# Patient Record
Sex: Female | Born: 1950
Health system: Southern US, Community
[De-identification: ages and names within clinical notes are randomized; demographics above are authoritative.]

## PROBLEM LIST (undated history)

## (undated) DIAGNOSIS — K219 Gastro-esophageal reflux disease without esophagitis: Secondary | ICD-10-CM

## (undated) DIAGNOSIS — E669 Obesity, unspecified: Secondary | ICD-10-CM

## (undated) DIAGNOSIS — M1990 Unspecified osteoarthritis, unspecified site: Secondary | ICD-10-CM

## (undated) DIAGNOSIS — J45909 Unspecified asthma, uncomplicated: Secondary | ICD-10-CM

## (undated) DIAGNOSIS — G43909 Migraine, unspecified, not intractable, without status migrainosus: Secondary | ICD-10-CM

## (undated) DIAGNOSIS — K589 Irritable bowel syndrome without diarrhea: Secondary | ICD-10-CM

## (undated) DIAGNOSIS — H269 Unspecified cataract: Secondary | ICD-10-CM

## (undated) DIAGNOSIS — J309 Allergic rhinitis, unspecified: Secondary | ICD-10-CM

## (undated) DIAGNOSIS — I10 Essential (primary) hypertension: Secondary | ICD-10-CM

## (undated) DIAGNOSIS — G609 Hereditary and idiopathic neuropathy, unspecified: Secondary | ICD-10-CM

## (undated) DIAGNOSIS — F411 Generalized anxiety disorder: Secondary | ICD-10-CM

## (undated) DIAGNOSIS — N189 Chronic kidney disease, unspecified: Secondary | ICD-10-CM

## (undated) DIAGNOSIS — L4050 Arthropathic psoriasis, unspecified: Secondary | ICD-10-CM

## (undated) DIAGNOSIS — J42 Unspecified chronic bronchitis: Secondary | ICD-10-CM

## (undated) DIAGNOSIS — J449 Chronic obstructive pulmonary disease, unspecified: Secondary | ICD-10-CM

## (undated) DIAGNOSIS — R011 Cardiac murmur, unspecified: Secondary | ICD-10-CM

## (undated) DIAGNOSIS — IMO0002 Reserved for concepts with insufficient information to code with codable children: Secondary | ICD-10-CM

## (undated) DIAGNOSIS — I872 Venous insufficiency (chronic) (peripheral): Secondary | ICD-10-CM

## (undated) DIAGNOSIS — D649 Anemia, unspecified: Secondary | ICD-10-CM

## (undated) DIAGNOSIS — E039 Hypothyroidism, unspecified: Secondary | ICD-10-CM

## (undated) DIAGNOSIS — T7840XA Allergy, unspecified, initial encounter: Secondary | ICD-10-CM

## (undated) HISTORY — DX: Cardiac murmur, unspecified: R01.1

## (undated) HISTORY — DX: Irritable bowel syndrome, unspecified: K58.9

## (undated) HISTORY — PX: TOTAL SHOULDER ARTHROPLASTY: SHX126

## (undated) HISTORY — PX: ABLATION ON ENDOMETRIOSIS: SHX5787

## (undated) HISTORY — DX: Unspecified cataract: H26.9

## (undated) HISTORY — DX: Migraine, unspecified, not intractable, without status migrainosus: G43.909

## (undated) HISTORY — DX: Venous insufficiency (chronic) (peripheral): I87.2

## (undated) HISTORY — PX: EYE SURGERY: SHX253

## (undated) HISTORY — DX: Allergy, unspecified, initial encounter: T78.40XA

## (undated) HISTORY — DX: Hypothyroidism, unspecified: E03.9

## (undated) HISTORY — PX: ABDOMINAL HYSTERECTOMY: SUR658

## (undated) HISTORY — PX: CATARACT EXTRACTION, BILATERAL: SHX1313

## (undated) HISTORY — PX: NASAL SINUS SURGERY: SHX719

## (undated) HISTORY — PX: FRACTURE SURGERY: SHX138

## (undated) HISTORY — DX: Essential (primary) hypertension: I10

## (undated) HISTORY — DX: Unspecified osteoarthritis, unspecified site: M19.90

## (undated) HISTORY — DX: Gastro-esophageal reflux disease without esophagitis: K21.9

## (undated) HISTORY — DX: Allergic rhinitis, unspecified: J30.9

## (undated) HISTORY — DX: Arthropathic psoriasis, unspecified: L40.50

## (undated) HISTORY — PX: JOINT REPLACEMENT: SHX530

## (undated) HISTORY — DX: Generalized anxiety disorder: F41.1

## (undated) HISTORY — DX: Unspecified asthma, uncomplicated: J45.909

## (undated) HISTORY — PX: TUBAL LIGATION: SHX77

## (undated) HISTORY — DX: Unspecified chronic bronchitis: J42

## (undated) HISTORY — DX: Chronic kidney disease, unspecified: N18.9

## (undated) HISTORY — DX: Obesity, unspecified: E66.9

## (undated) HISTORY — DX: Hereditary and idiopathic neuropathy, unspecified: G60.9

## (undated) HISTORY — PX: ABDOMINAL HYSTERECTOMY: SHX81

## (undated) HISTORY — DX: Anemia, unspecified: D64.9

## (undated) HISTORY — DX: Chronic obstructive pulmonary disease, unspecified: J44.9

## (undated) HISTORY — DX: Reserved for concepts with insufficient information to code with codable children: IMO0002

---

## 1998-08-21 ENCOUNTER — Other Ambulatory Visit: Admission: RE | Admit: 1998-08-21 | Discharge: 1998-08-21 | Payer: Self-pay | Admitting: *Deleted

## 1998-09-03 ENCOUNTER — Other Ambulatory Visit: Admission: RE | Admit: 1998-09-03 | Discharge: 1998-09-03 | Payer: Self-pay | Admitting: Obstetrics and Gynecology

## 1999-10-08 ENCOUNTER — Other Ambulatory Visit: Admission: RE | Admit: 1999-10-08 | Discharge: 1999-10-08 | Payer: Self-pay | Admitting: *Deleted

## 2000-03-30 ENCOUNTER — Other Ambulatory Visit: Admission: RE | Admit: 2000-03-30 | Discharge: 2000-03-30 | Payer: Self-pay | Admitting: Podiatry

## 2002-08-24 ENCOUNTER — Encounter: Payer: Self-pay | Admitting: Rheumatology

## 2002-08-24 ENCOUNTER — Encounter: Admission: RE | Admit: 2002-08-24 | Discharge: 2002-08-24 | Payer: Self-pay | Admitting: Rheumatology

## 2003-06-09 HISTORY — PX: SHOULDER SURGERY: SHX246

## 2003-08-27 ENCOUNTER — Other Ambulatory Visit: Admission: RE | Admit: 2003-08-27 | Discharge: 2003-08-27 | Payer: Self-pay | Admitting: Obstetrics & Gynecology

## 2003-08-31 ENCOUNTER — Emergency Department (HOSPITAL_COMMUNITY): Admission: AD | Admit: 2003-08-31 | Discharge: 2003-08-31 | Payer: Self-pay | Admitting: Family Medicine

## 2003-12-03 ENCOUNTER — Emergency Department (HOSPITAL_COMMUNITY): Admission: EM | Admit: 2003-12-03 | Discharge: 2003-12-03 | Payer: Self-pay | Admitting: Emergency Medicine

## 2004-04-23 ENCOUNTER — Ambulatory Visit: Payer: Self-pay | Admitting: Pulmonary Disease

## 2004-08-05 ENCOUNTER — Ambulatory Visit: Payer: Self-pay | Admitting: Internal Medicine

## 2004-08-07 ENCOUNTER — Ambulatory Visit: Payer: Self-pay | Admitting: Pulmonary Disease

## 2004-08-16 ENCOUNTER — Emergency Department (HOSPITAL_COMMUNITY): Admission: EM | Admit: 2004-08-16 | Discharge: 2004-08-16 | Payer: Self-pay | Admitting: Family Medicine

## 2004-08-20 ENCOUNTER — Ambulatory Visit (HOSPITAL_COMMUNITY): Admission: RE | Admit: 2004-08-20 | Discharge: 2004-08-20 | Payer: Self-pay | Admitting: Emergency Medicine

## 2004-08-26 ENCOUNTER — Ambulatory Visit: Payer: Self-pay | Admitting: Internal Medicine

## 2004-08-26 HISTORY — PX: COLONOSCOPY: SHX174

## 2005-04-29 ENCOUNTER — Ambulatory Visit: Payer: Self-pay | Admitting: Pulmonary Disease

## 2005-09-02 ENCOUNTER — Ambulatory Visit: Payer: Self-pay | Admitting: Pulmonary Disease

## 2006-02-15 ENCOUNTER — Encounter: Admission: RE | Admit: 2006-02-15 | Discharge: 2006-02-15 | Payer: Self-pay | Admitting: Orthopedic Surgery

## 2006-03-29 ENCOUNTER — Ambulatory Visit: Payer: Self-pay | Admitting: Pulmonary Disease

## 2006-04-16 ENCOUNTER — Ambulatory Visit: Payer: Self-pay | Admitting: Pulmonary Disease

## 2006-04-16 LAB — CONVERTED CEMR LAB
ALT: 25 units/L (ref 0–40)
Alkaline Phosphatase: 80 units/L (ref 39–117)
BUN: 16 mg/dL (ref 6–23)
Basophils Relative: 1.1 % — ABNORMAL HIGH (ref 0.0–1.0)
Calcium: 9.3 mg/dL (ref 8.4–10.5)
Chol/HDL Ratio, serum: 3.4
Cholesterol: 189 mg/dL (ref 0–200)
Glomerular Filtration Rate, Af Am: 67 mL/min/{1.73_m2}
HCT: 39.2 % (ref 36.0–46.0)
Hemoglobin: 13.3 g/dL (ref 12.0–15.0)
Lymphocytes Relative: 32.5 % (ref 12.0–46.0)
Neutrophils Relative %: 56 % (ref 43.0–77.0)
Potassium: 4.6 meq/L (ref 3.5–5.1)
TSH: 2.84 microintl units/mL (ref 0.35–5.50)
Total Protein: 6.9 g/dL (ref 6.0–8.3)
WBC: 7.1 10*3/uL (ref 4.5–10.5)

## 2006-09-29 ENCOUNTER — Ambulatory Visit: Payer: Self-pay | Admitting: Pulmonary Disease

## 2007-04-08 ENCOUNTER — Ambulatory Visit: Payer: Self-pay | Admitting: Pulmonary Disease

## 2007-06-09 HISTORY — PX: BUNIONECTOMY: SHX129

## 2007-11-24 DIAGNOSIS — G609 Hereditary and idiopathic neuropathy, unspecified: Secondary | ICD-10-CM | POA: Insufficient documentation

## 2007-11-24 DIAGNOSIS — K589 Irritable bowel syndrome without diarrhea: Secondary | ICD-10-CM | POA: Insufficient documentation

## 2007-11-24 DIAGNOSIS — K219 Gastro-esophageal reflux disease without esophagitis: Secondary | ICD-10-CM | POA: Insufficient documentation

## 2007-11-24 DIAGNOSIS — F411 Generalized anxiety disorder: Secondary | ICD-10-CM | POA: Insufficient documentation

## 2007-11-24 DIAGNOSIS — L4050 Arthropathic psoriasis, unspecified: Secondary | ICD-10-CM | POA: Insufficient documentation

## 2007-12-28 ENCOUNTER — Telehealth: Payer: Self-pay | Admitting: Pulmonary Disease

## 2008-01-26 ENCOUNTER — Ambulatory Visit: Payer: Self-pay | Admitting: Pulmonary Disease

## 2008-02-08 ENCOUNTER — Encounter: Payer: Self-pay | Admitting: Pulmonary Disease

## 2008-03-08 ENCOUNTER — Ambulatory Visit: Payer: Self-pay | Admitting: Pulmonary Disease

## 2008-03-11 DIAGNOSIS — I872 Venous insufficiency (chronic) (peripheral): Secondary | ICD-10-CM | POA: Insufficient documentation

## 2008-03-11 DIAGNOSIS — Z8669 Personal history of other diseases of the nervous system and sense organs: Secondary | ICD-10-CM | POA: Insufficient documentation

## 2008-03-11 DIAGNOSIS — J42 Unspecified chronic bronchitis: Secondary | ICD-10-CM | POA: Insufficient documentation

## 2008-03-11 DIAGNOSIS — J309 Allergic rhinitis, unspecified: Secondary | ICD-10-CM | POA: Insufficient documentation

## 2008-03-11 LAB — CONVERTED CEMR LAB
Alkaline Phosphatase: 69 units/L (ref 39–117)
Basophils Absolute: 0 10*3/uL (ref 0.0–0.1)
Bilirubin, Direct: 0.1 mg/dL (ref 0.0–0.3)
GFR calc Af Amer: 74 mL/min
GFR calc non Af Amer: 61 mL/min
Glucose, Bld: 98 mg/dL (ref 70–99)
HCT: 36.4 % (ref 36.0–46.0)
HDL: 64.7 mg/dL (ref >39.0)
LDL Cholesterol: 86 mg/dL (ref 0–99)
Monocytes Absolute: 0.5 10*3/uL (ref 0.1–1.0)
Monocytes Relative: 5.5 % (ref 3.0–12.0)
Platelets: 258 10*3/uL (ref 150–400)
Potassium: 4.3 meq/L (ref 3.5–5.1)
RDW: 14.8 % — ABNORMAL HIGH (ref 11.5–14.6)
Sodium: 143 meq/L (ref 135–145)
Total Bilirubin: 0.7 mg/dL (ref 0.3–1.2)
Total CHOL/HDL Ratio: 2.7
Total Protein: 6.7 g/dL (ref 6.0–8.3)
Triglycerides: 114 mg/dL (ref 0–149)

## 2008-03-30 ENCOUNTER — Encounter: Payer: Self-pay | Admitting: Pulmonary Disease

## 2008-05-18 ENCOUNTER — Ambulatory Visit: Payer: Self-pay | Admitting: Pulmonary Disease

## 2008-05-25 ENCOUNTER — Encounter: Payer: Self-pay | Admitting: Pulmonary Disease

## 2008-07-18 ENCOUNTER — Encounter: Payer: Self-pay | Admitting: Pulmonary Disease

## 2008-11-22 ENCOUNTER — Encounter: Payer: Self-pay | Admitting: Pulmonary Disease

## 2009-03-08 ENCOUNTER — Ambulatory Visit: Payer: Self-pay | Admitting: Pulmonary Disease

## 2009-04-11 ENCOUNTER — Encounter: Payer: Self-pay | Admitting: Pulmonary Disease

## 2009-07-23 ENCOUNTER — Encounter: Payer: Self-pay | Admitting: Pulmonary Disease

## 2009-09-20 ENCOUNTER — Encounter: Payer: Self-pay | Admitting: Adult Health

## 2009-10-01 IMAGING — CR DG CHEST 2V
1 series · 1 of 1 positions shown · non-contrast
Comparison: Chest x-ray of 12/03/2003 and 04/23/2004

CLINICAL DATA: Acute sinusitis, allergic rhinitis, symptoms of
reflux, former smoker

CHEST - 2 VIEW

[view not recorded]
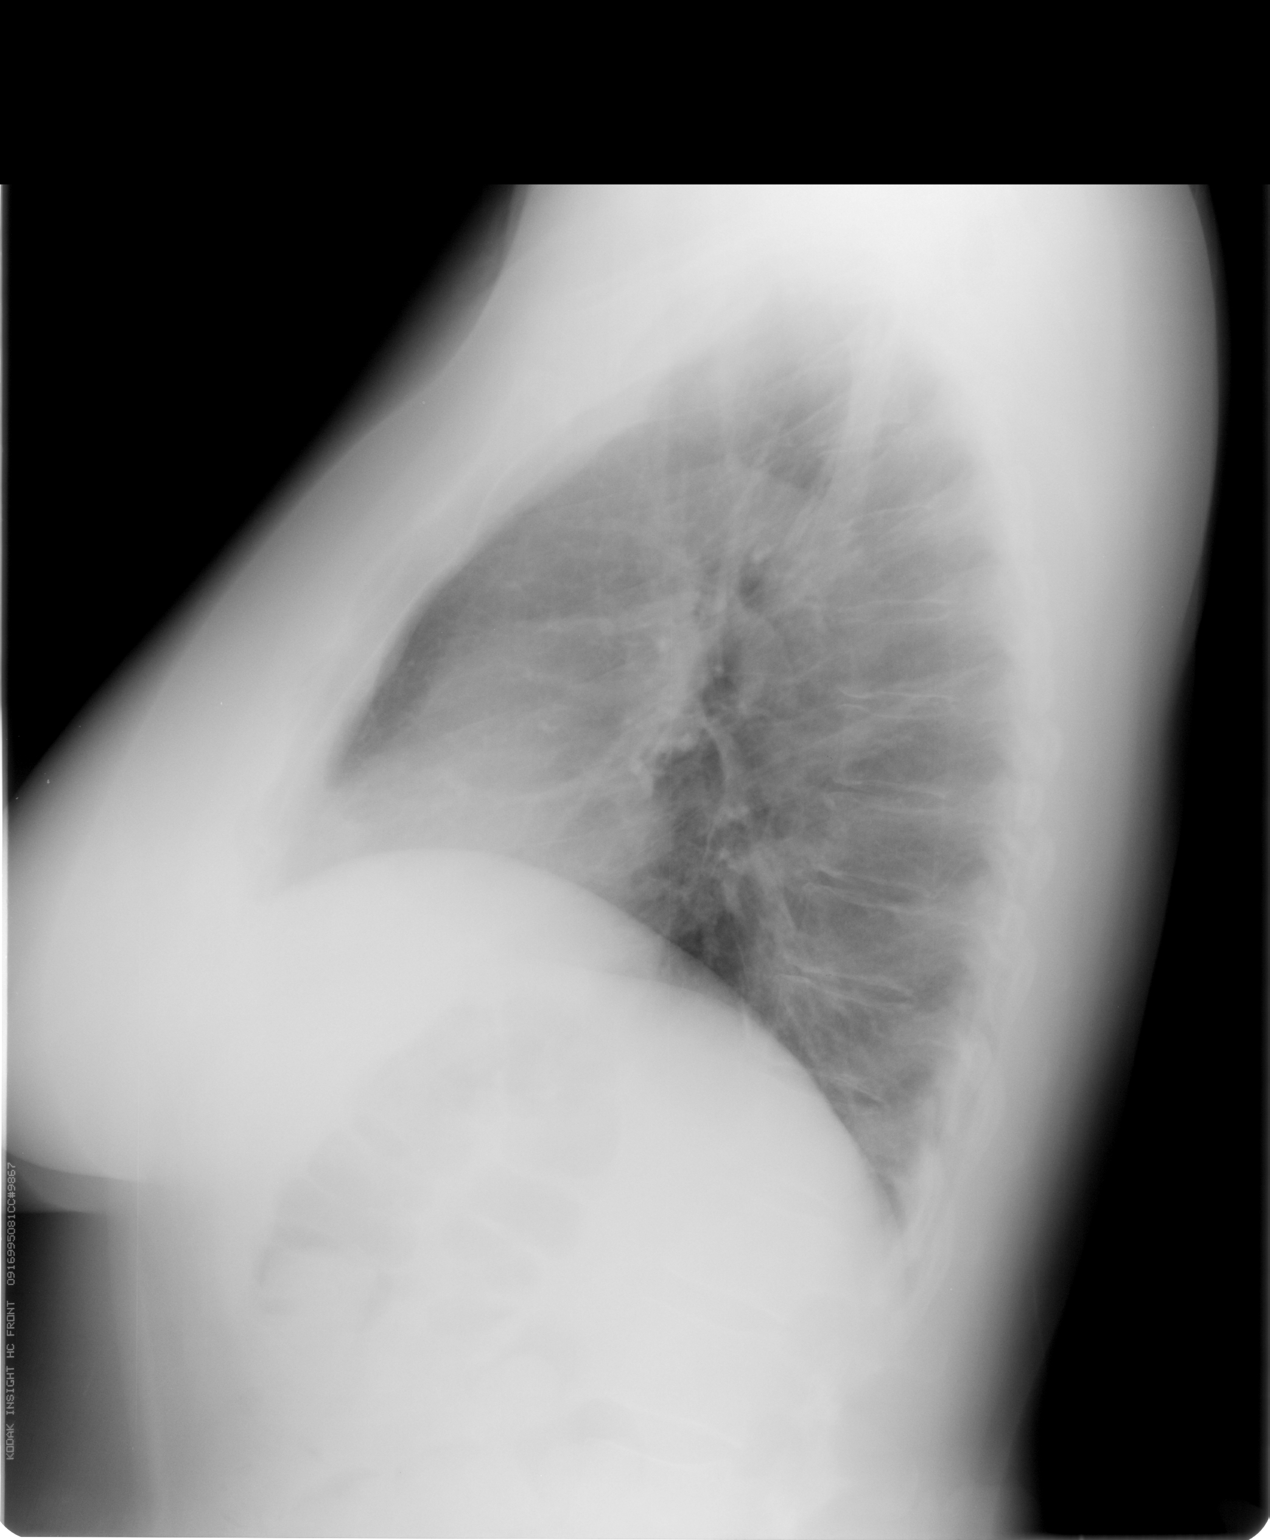

[1 of 1 positions shown; findings below may reference images not displayed]

FINDINGS: The lungs are clear.  Probable granuloma anteriorly on
the lateral view is stable.  The heart is within normal limits in
size.  No bony abnormality is seen.
IMPRESSION: Stable chest x-ray.  No active lung disease.

## 2009-10-02 ENCOUNTER — Ambulatory Visit: Payer: Self-pay | Admitting: Pulmonary Disease

## 2009-10-02 DIAGNOSIS — J45909 Unspecified asthma, uncomplicated: Secondary | ICD-10-CM | POA: Insufficient documentation

## 2009-10-02 DIAGNOSIS — J019 Acute sinusitis, unspecified: Secondary | ICD-10-CM | POA: Insufficient documentation

## 2009-10-02 DIAGNOSIS — D649 Anemia, unspecified: Secondary | ICD-10-CM | POA: Insufficient documentation

## 2009-10-23 LAB — CONVERTED CEMR LAB
Folate: 6.5 ng/mL
Saturation Ratios: 12.6 % — ABNORMAL LOW (ref 20.0–50.0)
Transferrin: 265.8 mg/dL (ref 212.0–360.0)
Vitamin B-12: 840 pg/mL (ref 211–911)

## 2009-11-21 ENCOUNTER — Ambulatory Visit: Payer: Self-pay | Admitting: Pulmonary Disease

## 2009-11-26 LAB — CONVERTED CEMR LAB: OCCULT 1: NEGATIVE

## 2010-01-16 ENCOUNTER — Encounter: Payer: Self-pay | Admitting: Pulmonary Disease

## 2010-01-17 ENCOUNTER — Ambulatory Visit: Payer: Self-pay | Admitting: Pulmonary Disease

## 2010-01-17 DIAGNOSIS — E669 Obesity, unspecified: Secondary | ICD-10-CM | POA: Insufficient documentation

## 2010-01-17 DIAGNOSIS — M1990 Unspecified osteoarthritis, unspecified site: Secondary | ICD-10-CM | POA: Insufficient documentation

## 2010-01-17 DIAGNOSIS — E039 Hypothyroidism, unspecified: Secondary | ICD-10-CM | POA: Insufficient documentation

## 2010-03-04 ENCOUNTER — Encounter: Payer: Self-pay | Admitting: Pulmonary Disease

## 2010-03-06 ENCOUNTER — Telehealth (INDEPENDENT_AMBULATORY_CARE_PROVIDER_SITE_OTHER): Payer: Self-pay | Admitting: *Deleted

## 2010-03-07 ENCOUNTER — Ambulatory Visit: Payer: Self-pay | Admitting: Pulmonary Disease

## 2010-03-11 ENCOUNTER — Telehealth: Payer: Self-pay | Admitting: Pulmonary Disease

## 2010-04-08 HISTORY — PX: OTHER SURGICAL HISTORY: SHX169

## 2010-04-30 ENCOUNTER — Inpatient Hospital Stay (HOSPITAL_COMMUNITY): Admission: RE | Admit: 2010-04-30 | Discharge: 2010-05-05 | Payer: Self-pay | Admitting: Orthopedic Surgery

## 2010-04-30 ENCOUNTER — Inpatient Hospital Stay: Admission: RE | Admit: 2010-04-30 | Payer: Self-pay | Admitting: Orthopedic Surgery

## 2010-06-17 ENCOUNTER — Telehealth (INDEPENDENT_AMBULATORY_CARE_PROVIDER_SITE_OTHER): Payer: Self-pay | Admitting: *Deleted

## 2010-06-19 ENCOUNTER — Other Ambulatory Visit: Payer: Self-pay | Admitting: Adult Health

## 2010-06-19 ENCOUNTER — Ambulatory Visit
Admission: RE | Admit: 2010-06-19 | Discharge: 2010-06-19 | Payer: Self-pay | Source: Home / Self Care | Attending: Adult Health | Admitting: Adult Health

## 2010-06-19 LAB — CBC WITH DIFFERENTIAL/PLATELET
Basophils Absolute: 0 10*3/uL (ref 0.0–0.1)
Basophils Relative: 0.5 % (ref 0.0–3.0)
Eosinophils Absolute: 0.1 10*3/uL (ref 0.0–0.7)
Eosinophils Relative: 1.9 % (ref 0.0–5.0)
HCT: 38.3 % (ref 36.0–46.0)
Hemoglobin: 12.8 g/dL (ref 12.0–15.0)
Lymphocytes Relative: 39.3 % (ref 12.0–46.0)
Lymphs Abs: 3 10*3/uL (ref 0.7–4.0)
MCHC: 33.4 g/dL (ref 30.0–36.0)
MCV: 88.7 fl (ref 78.0–100.0)
Monocytes Absolute: 1 10*3/uL (ref 0.1–1.0)
Monocytes Relative: 12.6 % — ABNORMAL HIGH (ref 3.0–12.0)
Neutro Abs: 3.5 10*3/uL (ref 1.4–7.7)
Neutrophils Relative %: 45.7 % (ref 43.0–77.0)
Platelets: 234 10*3/uL (ref 150.0–400.0)
RBC: 4.31 Mil/uL (ref 3.87–5.11)
RDW: 14.9 % — ABNORMAL HIGH (ref 11.5–14.6)
WBC: 7.7 10*3/uL (ref 4.5–10.5)

## 2010-06-19 LAB — BASIC METABOLIC PANEL WITH GFR
BUN: 9 mg/dL (ref 6–23)
CO2: 23 meq/L (ref 19–32)
Calcium: 8.8 mg/dL (ref 8.4–10.5)
Chloride: 105 meq/L (ref 96–112)
Creatinine, Ser: 1 mg/dL (ref 0.4–1.2)
GFR: 63.64 mL/min (ref >60.00)
Glucose, Bld: 85 mg/dL (ref 70–99)
Potassium: 3.9 meq/L (ref 3.5–5.1)
Sodium: 137 meq/L (ref 135–145)

## 2010-06-19 LAB — TSH: TSH: 2.02 u[IU]/mL (ref 0.35–5.50)

## 2010-06-20 DIAGNOSIS — G4700 Insomnia, unspecified: Secondary | ICD-10-CM | POA: Insufficient documentation

## 2010-06-30 ENCOUNTER — Encounter: Payer: Self-pay | Admitting: Pulmonary Disease

## 2010-07-08 ENCOUNTER — Other Ambulatory Visit: Payer: Self-pay | Admitting: Dermatology

## 2010-07-10 NOTE — Assessment & Plan Note (Signed)
Summary: Acute NP office visit - sinus inf   CC:  sinus pressure/congestion, yellow/green nasal drainage, and PND  x2weeks - denies f/c/s.  History of Present Illness: 60 year old female with known history of Psoriatic Arthritis, Allergic Rhinitis, HTN.    ~  March 08, 2009:  she states that she has been doing well and has no new complaints or concerns... she is followed by [name] for Rheum- w/ psoriatic arthritis on Enbrel/ MTX... she reports incr LFT's prob due to Celebrex & she is off this NSAID... followed by [name] for Endocrine on Levoxyl 14mcg/d now "it was up to 3.7 & she started meds because of low temp & my hair falling out"... also followed by [name] for HA's on Aten, Imitrex, Phenergan (she states HA's reduced off Premarin)... she gets labs every 6 weeks from [name] & doesn't want additional lab work here... requests DCN100 & Flu shot.   October 02, 2009--Presents for an acute office visit. Complains of sinus pressure/congestion, yellow/green nasal drainage, PND  x2weeks. OTC not helping. Recent labs at [facility] showed slightly low HGB 11.8. , prev. 12.8 on 2009, last colon 2006 w/ divertics. Denies chest pain, dyspnea, orthopnea, hemoptysis, fever, n/v/d, edema, headache, bloody stools.   Medications Prior to Update: 1)  Nasonex 50 Mcg/act  Susp (Mometasone Furoate) .Marland Kitchen.. 1-2 Sprays Up To Twice A Day As Needed 2)  Astelin 137 Mcg/spray  Soln (Azelastine Hcl) .Marland Kitchen.. 1-2 Sprays Up To Twice A Day As Needed 3)  Xyzal 5 Mg  Tabs (Levocetirizine Dihydrochloride) .... Take 1 Tablet By Mouth Once A Day 4)  Atenolol 25 Mg  Tabs (Atenolol) .... Take 1 Tablet By Mouth Once A Day 5)  Levothyroxine Sodium 50 Mcg Tabs (Levothyroxine Sodium) .... Take 1 Tablet By Mouth Once A Day 6)  Pepcid 20 Mg Tabs (Famotidine) .... Take 2 Tablets By Mouth Once Daily 7)  Promethazine Hcl 25 Mg  Tabs (Promethazine Hcl) .... As Needed 8)  Enbrel 25 Mg  Kit (Etanercept) .... Once A Week 9)   Methotrexate 2.5 Mg  Tabs (Methotrexate Sodium) .... 8 Tablets By Mouth A Week 10)  Darvocet-N 100 100-650 Mg Tabs (Propoxyphene N-Apap) .... Take 1 Tab By Mouth Every 8-12 H As Needed For Pain... 11)  Imitrex 20 Mg/act  Soln (Sumatriptan) .... As Needed 12)  Calcium 600 1500 Mg  Tabs (Calcium Carbonate) .... Take 1 Tablet By Mouth Two Times A Day 13)  Daily Multiple Vitamins   Tabs (Multiple Vitamin) .... Take 1 Tablet By Mouth Once A Day  Current Medications (verified): 1)  Nasonex 50 Mcg/act  Susp (Mometasone Furoate) .Marland Kitchen.. 1-2 Sprays Up To Twice A Day As Needed 2)  Astelin 137 Mcg/spray  Soln (Azelastine Hcl) .Marland Kitchen.. 1-2 Sprays Up To Twice A Day As Needed 3)  Xyzal 5 Mg  Tabs (Levocetirizine Dihydrochloride) .... Take 1 Tablet By Mouth Once A Day 4)  Atenolol 25 Mg  Tabs (Atenolol) .... Take 1 Tablet By Mouth Once A Day 5)  Levothyroxine Sodium 50 Mcg Tabs (Levothyroxine Sodium) .... Take 1 Tablet By Mouth Once A Day 6)  Promethazine Hcl 25 Mg  Tabs (Promethazine Hcl) .... As Needed 7)  Enbrel 25 Mg  Kit (Etanercept) .... Once A Week 8)  Methotrexate 2.5 Mg  Tabs (Methotrexate Sodium) .... 6 Tablets By Mouth A Week 9)  Darvocet-N 100 100-650 Mg Tabs (Propoxyphene N-Apap) .... Take 1 Tab By Mouth Every 8-12 H As Needed For Pain... 10)  Imitrex 20 Mg/act  Soln (Sumatriptan) .... As Needed 11)  Calcium 600 1500 Mg  Tabs (Calcium Carbonate) .... Take 1 Tablet By Mouth Two Times A Day 12)  Daily Multiple Vitamins   Tabs (Multiple Vitamin) .... Take 1 Tablet By Mouth Once A Day 13)  Claritin-D 12 Hour 5-120 Mg Xr12h-Tab (Loratadine-Pseudoephedrine) .... Per Bottle 14)  Clonidine Hcl 0.3 Mg Tabs (Clonidine Hcl) .... Take 1 Tablet By Mouth Once A Day 15)  Celebrex 200 Mg Caps (Celecoxib) .... Take One Capsule By Mouth Every Other Day  Allergies (verified): 1)  ! Pcn 2)  ! 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 +allergy testing in past- trees & grasses mostly...  BRONCHITIS, RECURRENT (ICD-491.9) - .  VENOUS INSUFFICIENCY (ICD-459.81) - she follows a low salt diet, elevates legs, and wears support hose Prn... she's had normal ArtDopplers and neg VenDopplers in the last 62yrs...  GERD (ICD-530.81) - she uses OTC Pepcid as needed...  IRRITABLE BOWEL SYNDROME (ICD-564.1) - last colonoscopy 3/06 by [name] showed divertics, hems, prob IBS... f/u planned 16yrs.  PSORIATIC ARTHROPATHY (ICD-696.0) - on ENBREL 50mg /wk, METHOTRXATE 2.5mg tabs- 6tabs/wk... followed by [name] locally and prev by [name]... also sees [name] for [employer]... she has osteoarthritis as well, and requests DCN100.  Review of Systems      See HPI  Vital Signs:  Patient profile:   60 year old female Height:      68.5 inches Weight:      242.38 pounds BMI:     36.45 O2 Sat:      98 % on Room air Temp:     97.0 degrees F oral Pulse rate:   84 / minute BP sitting:   118 / 80  (left arm) Cuff size:   large  Vitals Entered By: Boone Master CNA (October 02, 2009 11:57 AM)  O2 Flow:  Room air CC: sinus pressure/congestion, yellow/green nasal drainage, PND  x2weeks - denies f/c/s Is Patient Diabetic? No Comments Medications reviewed with patient Daytime contact number verified with patient. Boone Master CNA  October 02, 2009 11:59 AM    Physical Exam  Additional Exam:  WD, Overweight, 60 y/o WF in NAD... GENERAL:  Alert & oriented; pleasant &  cooperative... HEENT:  [place]/AT, EOM-wnl, PERRLA, EACs-clear, TMs-wnl, NOSE-clear, THROAT-clear & wnl. NECK:  Supple w/ fairROM; no JVD; normal carotid impulses w/o bruits; no thyromegaly or nodules palpated; no lymphadenopathy. CHEST:  Clear to P & A; without wheezes/ rales/ or rhonchi. HEART:  Regular Rhythm; without murmurs/ rubs/ or gallops. ABDOMEN:  Soft & nontender; normal bowel sounds; no organomegaly or masses detected. EXT: without deformities, mod arthritic changes; no varicose veins/ +venous insuffic/ tr edema. NEURO:  CN's intact; motor testing normal; sensory testing normal; gait normal & balance OK. DERM:  few min psoriatic patches...      Impression & Recommendations:  Problem # 1:  SINUSITIS, ACUTE (ICD-461.9)  Omnicef 300mg  two times a day for 10 days  Mucinex two times a day as needed congestion Saline nasal rinses  as needed  Please contact office for sooner follow up if symptoms do not improve or worsen  Her updated medication list for this problem includes:    Nasonex 50 Mcg/act Susp (Mometasone furoate) .Marland Kitchen... 1-2 sprays up to twice a day as needed    Astelin 137 Mcg/spray Soln (Azelastine hcl) .Marland Kitchen... 1-2 sprays up to twice a day as needed    Claritin-d 12 Hour 5-120 Mg Xr12h-tab (Loratadine-pseudoephedrine) .Marland Kitchen... Per bottle    Cefdinir 300 Mg Caps (Cefdinir) .Marland Kitchen... 1 by mouth two times a day  Orders: Est. Patient Level IV (24401)  Problem # 2:  ANEMIA (ICD-285.9) sl drop in hbg  check stool cards and iron profile/b12 Orders: TLB-IBC Pnl (Iron/FE;Transferrin) (83550-IBC) TLB-B12 + Folate Pnl ([id number]/VQQ) Est. Patient Level IV (59563)  Medications Added to Medication List This Visit: 1)  Methotrexate 2.5 Mg Tabs (Methotrexate sodium) .... 6 tablets by mouth a week 2)  Claritin-d 12 Hour 5-120 Mg Xr12h-tab (Loratadine-pseudoephedrine) .... Per bottle 3)  Clonidine Hcl 0.3 Mg Tabs (Clonidine hcl) .... Take 1 tablet by mouth once a day 4)  Celebrex 200  Mg Caps (Celecoxib) .... Take one capsule by mouth every other day 5)  Cefdinir 300 Mg Caps (Cefdinir) .Marland Kitchen.. 1 by mouth two times a day  Complete Medication List: 1)  Nasonex 50 Mcg/act Susp (Mometasone furoate) .Marland Kitchen.. 1-2 sprays up to twice a day as needed 2)  Astelin 137 Mcg/spray Soln (Azelastine hcl) .Marland Kitchen.. 1-2 sprays up to twice a day as needed 3)  Xyzal 5 Mg Tabs (Levocetirizine dihydrochloride) .... Take 1 tablet by mouth once a day 4)  Atenolol 25 Mg Tabs (Atenolol) .... Take 1 tablet by mouth once a day 5)  Levothyroxine Sodium 50 Mcg Tabs (Levothyroxine sodium) .... Take 1 tablet by mouth once a day 6)  Promethazine Hcl 25 Mg Tabs (Promethazine hcl) .... As needed 7)  Enbrel 25 Mg Kit (Etanercept) .... Once a week 8)  Methotrexate 2.5 Mg Tabs (Methotrexate sodium) .... 6 tablets by mouth a week 9)  Darvocet-n 100 100-650 Mg Tabs (Propoxyphene n-apap) .... Take 1 tab by mouth every 8-12 h as needed for pain... 10)  Imitrex 20 Mg/act Soln (Sumatriptan) .... As needed 11)  Calcium 600 1500 Mg Tabs (Calcium carbonate) .... Take 1 tablet by mouth two times a day 12)  Daily Multiple Vitamins Tabs (Multiple vitamin) .... Take 1 tablet by mouth once a day 13)  Claritin-d 12 Hour 5-120 Mg Xr12h-tab (Loratadine-pseudoephedrine) .... Per bottle 14)  Clonidine Hcl 0.3 Mg Tabs (Clonidine hcl) .... Take 1 tablet by mouth once a day 15)  Celebrex 200 Mg Caps (Celecoxib) .... Take one capsule by mouth every other day 16)  Cefdinir 300 Mg Caps (Cefdinir) .Marland Kitchen.. 1 by mouth two times a day  Patient Instructions: 1)  Return stool cards 2)  I will call with lab results  3)  Omnicef 300mg  two times a day for 10 days  4)  Mucinex two times a day as needed congestion 5)  Saline nasal rinses as needed  6)  Please contact office for sooner follow up if symptoms do not improve or worsen  Prescriptions: CEFDINIR 300 MG CAPS (CEFDINIR) 1 by mouth two times a day  #20 x 0   Entered and Authorized by:   Rubye Oaks NP   Signed by:   Rubye Oaks NP on 10/02/2009   Method used:   Electronically to        [facility]  [address] [phone]* (retail)       [address]       [place], [place]  [address]       Ph: [phone] or [phone]       Fax: [phone]   RxID:   [id number]

## 2010-07-10 NOTE — Letter (Signed)
Summary: Surgical Clearance/[facility]  Surgical Clearance/[facility]   Imported By: Sherian Rein 03/07/2010 12:09:30  _____________________________________________________________________  External Attachment:    Type:   Image     Comment:   External Document

## 2010-07-10 NOTE — Assessment & Plan Note (Signed)
Summary: Acute NP office visit - multiple complaints   CC:  rash/dryness on face and hair loss x3weeks, difficulty staying asleep, and sleeping approx 1 hr at a time x8months.  History of Present Illness: 60 year old female with known history of Psoriatic Arthritis, Allergic Rhinitis, HTN.    ~  March 08, 2009:  she states that she has been doing well and has no new complaints or concerns... she is followed by [name] for Rheum- w/ psoriatic arthritis on Enbrel/ MTX... she reports incr LFT's prob due to Celebrex & she is off this NSAID... followed by [name] for Endocrine on Levoxyl 78mcg/d now "it was up to 3.7 & she started meds because of low temp & my hair falling out"... also followed by [name] for HA's on Aten, Imitrex, Phenergan (she states HA's reduced off Premarin)... she gets labs every 6 weeks from [name] & doesn't want additional lab work here... requests DCN100 & Flu shot.   October 02, 2009--Presents for an acute office visit. Complains of sinus pressure/congestion, yellow/green nasal drainage, PND  x2weeks. OTC not helping. Recent labs at [facility] showed slightly low HGB 11.8. , prev. 12.8 on 2009, last colon 2006 w/ divertics. Denies chest pain, dyspnea, orthopnea, hemoptysis, fever, n/v/d, edema, headache, bloody stools.   June 19, 2010--Prsents for work in visit. Pt had recent knee sugery- s/p bilateral TKR 11/23.  She continues to go thru PT. Has been having trouble sleeping since surgery. Waking up frequently, trouble staying asleep. Tired in am. She denies chest pain , gerd, orthopnea or dyspnea. Has used ambien in past-which worked good. Has noticed since surgery that she is loosing more hair than usual. Face has dry patches on cheeks. No painful rash or new meds.She has changed face creams without much help.  Denies chest pain, dyspnea, orthopnea, hemoptysis, fever, n/v/d, edema, headache.   Medications Prior to Update: 1)  Nasonex 50 Mcg/act  Susp (Mometasone  Furoate) .Marland Kitchen.. 1-2 Sprays Up To Twice A Day As Needed 2)  Astelin 137 Mcg/spray  Soln (Azelastine Hcl) .Marland Kitchen.. 1-2 Sprays Up To Twice A Day As Needed 3)  Xyzal 5 Mg  Tabs (Levocetirizine Dihydrochloride) .... Take 1 Tablet By Mouth Once A Day 4)  Atenolol 25 Mg  Tabs (Atenolol) .... Take 1 Tablet By Mouth Once A Day 5)  Levothyroxine Sodium 50 Mcg Tabs (Levothyroxine Sodium) .... Take 1 Tablet By Mouth Once A Day 6)  Promethazine Hcl 25 Mg  Tabs (Promethazine Hcl) .... As Needed 7)  Enbrel 25 Mg  Kit (Etanercept) .... Once A Week 8)  Methotrexate 2.5 Mg  Tabs (Methotrexate Sodium) .... 6 Tablets By Mouth A Week 9)  Ultram 50 Mg Tabs (Tramadol Hcl) .... Take 2 By Mouth Two Times A Day 10)  Imitrex 20 Mg/act  Soln (Sumatriptan) .... As Needed 11)  Calcium 600 1500 Mg  Tabs (Calcium Carbonate) .... Take 1 Tablet By Mouth Two Times A Day 12)  Daily Multiple Vitamins   Tabs (Multiple Vitamin) .... Take 1 Tablet By Mouth Once A Day 13)  Clonidine Hcl 0.3 Mg Tabs (Clonidine Hcl) .... Take 1 Tablet By Mouth Once A Day  Current Medications (verified): 1)  Nasonex 50 Mcg/act  Susp (Mometasone Furoate) .Marland Kitchen.. 1-2 Sprays Up To Twice A Day As Needed 2)  Astelin 137 Mcg/spray  Soln (Azelastine Hcl) .Marland Kitchen.. 1-2 Sprays Up To Twice A Day As Needed 3)  Xyzal 5 Mg  Tabs (Levocetirizine Dihydrochloride) .... Take 1 Tablet By Mouth Once A Day  4)  Levothyroxine Sodium 50 Mcg Tabs (Levothyroxine Sodium) .... Take 1 Tablet By Mouth Once A Day 5)  Promethazine Hcl 25 Mg  Tabs (Promethazine Hcl) .... As Needed 6)  Enbrel 25 Mg  Kit (Etanercept) .... ***hold*** Once A Week 7)  Methotrexate 2.5 Mg  Tabs (Methotrexate Sodium) .... ***hold*** 6 Tablets By Mouth A Week 8)  Ultram 50 Mg Tabs (Tramadol Hcl) .... Take 2 By Mouth Two Times A Day 9)  Imitrex 20 Mg/act  Soln (Sumatriptan) .... As Needed 10)  Calcium 600 1500 Mg  Tabs (Calcium Carbonate) .... Take 1 Tablet By Mouth Two Times A Day 11)  Daily Multiple Vitamins   Tabs  (Multiple Vitamin) .... Take 1 Tablet By Mouth Once A Day 12)  Robaxin 500 Mg Tabs (Methocarbamol) .Marland Kitchen.. 1-2 Tabs By Mouth Daily As Needed  Allergies (verified): 1)  ! Pcn 2)  ! Codeine  Past History:  Past Medical History: Last updated: Feb 01, 2010 ALLERGIC RHINITIS (ICD-477.9) BRONCHITIS, RECURRENT (ICD-491.9) VENOUS INSUFFICIENCY (ICD-459.81) HYPOTHYROIDISM, BORDERLINE (ICD-244.9) OBESITY (ICD-278.00) GERD (ICD-530.81) IRRITABLE BOWEL SYNDROME (ICD-564.1) DEGENERATIVE JOINT DISEASE (ICD-715.90) PSORIATIC ARTHROPATHY (ICD-696.0) MIGRAINES, HX OF (ICD-V13.8) PERIPHERAL NEUROPATHY (ICD-356.9) ANXIETY (ICD-300.00) ANEMIA (ICD-285.9)  Past Surgical History: Last updated: 02/01/2010 S/P hysterectomy S/P sinus surgery by DrRedman S/P bilat cat surg- 2000 & 2005 S/P right shoulder surg by DrNorris 2005  Family History: Last updated: 01-Feb-2010 Father died age 49 e/ cirrhosis (Etoh) & Tb Mother died age 61 w/ COPD, former smoker 1 Sibling: Sister w/ thyroid prob & Chol  Social History: Last updated: 02/01/2010 Married, husb= Demetrius, 37yrs (3rd marriage) No children Ex-smoker 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  Risk Factors: Smoking Status: quit (February 01, 2010) Packs/Day: 1.5-2 (01-Feb-2010)  Review of Systems      See HPI  Vital Signs:  Patient profile:   60 year old female Height:      68.5 inches Weight:      233.38 pounds BMI:     35.10 O2 Sat:      97 % on Room air Temp:     96.7 degrees F oral Pulse rate:   98 / minute BP sitting:   122 / 76  (left arm) Cuff size:   regular  Vitals Entered By: Boone Master CNA/MA (June 19, 2010 3:02 PM)  O2 Flow:  Room air CC: rash/dryness on face and hair loss x3weeks, difficulty staying asleep, sleeping approx 1 hr at a time x61months Is Patient Diabetic? No Comments Medications reviewed with patient Daytime contact number verified with patient. Boone Master CNA/MA   June 19, 2010 3:02 PM    Physical Exam  Additional Exam:  WD, Overweight, 60 y/o WF in NAD... GENERAL:  Alert & oriented; pleasant & cooperative... HEENT:  [place]/AT, EOM-wnl, PERRLA, EACs-clear, TMs-wnl, NOSE-clear, THROAT-clear & wnl. NECK:  Supple w/ fairROM; no JVD; normal carotid impulses w/o bruits; no thyromegaly or nodules palpated; no lymphadenopathy. CHEST:  Clear to P & A; without wheezes/ rales/ or rhonchi. HEART:  Regular Rhythm; without murmurs/ rubs/ or gallops. ABDOMEN:  Soft & nontender; normal bowel sounds; no organomegaly or masses detected. EXT: without deformities, mod arthritic changes; no varicose veins/ +venous insuffic/ tr edema. NEURO:  CN's intact; motor testing normal; sensory testing normal; gait normal & balance OK. DERM:  dry patches along face-cheeks      Impression & Recommendations:  Problem # 1:  INSOMNIA (ICD-780.52)  Insomnia  advised on healthy sleep regimen ambien as needed   Her updated medication list  for this problem includes:    Ambien 5 Mg Tabs (Zolpidem tartrate) .Marland Kitchen... 1/2 -1 by mouth at bedtime as needed  trouble sleeping  Orders: Est. Patient Level IV (16109)  Problem # 2:  ANEMIA (ICD-285.9) labs pending.  follow up cbc  Orders: TLB-BMP (Basic Metabolic Panel-BMET) (80048-METABOL) TLB-CBC Platelet - w/Differential (85025-CBCD) Est. Patient Level IV ([address])  Problem # 3:  HYPOTHYROIDISM, BORDERLINE (ICD-244.9) labs pending  Her updated medication list for this problem includes:    Levothyroxine Sodium 50 Mcg Tabs (Levothyroxine sodium) .Marland Kitchen... Take 1 tablet by mouth once a day  Orders: TLB-TSH (Thyroid Stimulating Hormone) (84443-TSH) Est. Patient Level IV (09811)  Problem # 4:  PSORIATIC ARTHROPATHY (ICD-696.0)  dry skin on face ?etiology cont w/  sensistive skin lotion if not improving would rec derm eval.   Orders: Est. Patient Level IV (91478)  Medications Added to Medication List This Visit: 1)  Enbrel 25 Mg  Kit (Etanercept) .... ***hold*** once a week 2)  Methotrexate 2.5 Mg Tabs (Methotrexate sodium) .... ***hold*** 6 tablets by mouth a week 3)  Robaxin 500 Mg Tabs (Methocarbamol) .Marland Kitchen.. 1-2 tabs by mouth daily as needed 4)  Ambien 5 Mg Tabs (Zolpidem tartrate) .... 1/2 -1 by mouth at bedtime as needed  trouble sleeping  Patient Instructions: 1)  Healthy  sleep regimen. 2)  Try Ambien 5mg  1/2-1 by mouth at bedtime as needed trouble sleeping.  3)  follow up Dr. Kriste Basque as planned and as needed  4)  Please contact office for sooner follow up if symptoms do not improve or worsen  Prescriptions: AMBIEN 5 MG TABS (ZOLPIDEM TARTRATE) 1/2 -1 by mouth at bedtime as needed  trouble sleeping  #30 x 0   Entered and Authorized by:   Rubye Oaks NP   Signed by:   Tammy Parrett NP on 06/19/2010   Method used:   Print then Give to Patient   RxID:   [id number]

## 2010-07-10 NOTE — Letter (Signed)
Summary: [facility]  [facility]   Imported By: [place] 08/16/2009 10:16:02  _____________________________________________________________________  External Attachment:    Type:   Image     Comment:   External Document

## 2010-07-10 NOTE — Letter (Signed)
Summary: [facility]  [facility]   Imported By: Sherian Rein 01/30/2010 10:20:42  _____________________________________________________________________  External Attachment:    Type:   Image     Comment:   External Document

## 2010-07-10 NOTE — Progress Notes (Signed)
Summary: ? about pna and flu vaccine and also letter for surg clearance  Phone Note Call from Patient Call back at Home Phone [phone]   Caller: Patient Call For: nadel Summary of Call: pt wants a letter re: surgery clearance. (she was seen 01/17/10. also wants to know if she can get a flu shot and pna shot.  Initial call taken by: Tivis Ringer, CNA,  March 06, 2010 11:46 AM  Follow-up for Phone Call        no documentation in EMR regarding last PNA vaccine.  Therefore requested pt's paper chart.  Aundra Millet Reynolds LPN  March 06, 2010 11:48 AM   There is no PNA vaccine in pt paper chart. Please advis if ok for pt to have PNA vaccine and Flu vaccine? Carron Curie CMA  March 06, 2010 5:33 PM   Additional Follow-up for Phone Call Additional follow up Details #1::        per SN----letter has already been mailed and faxed to ortho for this last week.  ok for the flu shot.  thanks Randell Loop CMA  March 07, 2010 9:50 AM   Marliss Czar, what about the PNA vaccine?can she get that as well Arman Filter LPN  March 07, 2010 10:15 AM     Additional Follow-up for Phone Call Additional follow up Details #2::    PNA vaccine will not be due until age 79.  thanks Randell Loop CMA  March 07, 2010 10:24 AM   called and spoke with pt.  pt aware surgical clearance letter already faxed and mailed to ortho.  pt also aware SN ok'd for flu vaccine but doesn't need pna vaccine until after age 55.  pt verbalized understanding.  Aundra Millet Reynolds LPN  March 07, 2010 10:43 AM

## 2010-07-10 NOTE — Assessment & Plan Note (Signed)
Summary: flu shot/jd   Nurse Visit   Allergies: 1)  ! Pcn 2)  ! Codeine  Orders Added: 1)  Admin 1st Vaccine [90471] 2)  Flu Vaccine 67yrs + [14782] Flu Vaccine Consent Questions     Do you have a history of severe allergic reactions to this vaccine? no    Any prior history of allergic reactions to egg and/or gelatin? no    Do you have a sensitivity to the preservative Thimersol? no    Do you have a past history of Guillan-Barre Syndrome? no    Do you currently have an acute febrile illness? no    Have you ever had a severe reaction to latex? no    Vaccine information given and explained to patient? yes    Are you currently pregnant? no    Lot Number:[id number]   Exp Date:12/06/2010   Site Given  Left Deltoid IM  Tammy Scott  March 10, 2010 10:09 AM   Appended Document: flu shot/jd [employer] flu shot lot# [id number]

## 2010-07-10 NOTE — Assessment & Plan Note (Signed)
Summary: 3 month follow up w/ [facility]///JJ   CC:  10 month ROV & review of mult medical problems....  History of Present Illness: 60 y/o WF here for a follow up visit... she has multiple medical problems as noted below...     ~  March 08, 2009:  she states that she has been doing well and has no new complaints or concerns... she is followed by [name] for Rheum- w/ psoriatic arthritis on Enbrel/ MTX... she reports incr LFT's prob due to Celebrex & she is off this NSAID... followed by [name] for Endocrine on Levoxyl 16mcg/d now "it was up to 3.7 & she started meds because of low temp & my hair falling out"... also followed by [name] for HA's on Aten, Imitrex, Phenergan (she states HA's reduced off Premarin)... she gets labs every 6 weeks from [name] & doesn't want additional lab work here... requests DCN100 & Flu shot.   ~  January 17, 2010:  mult Ortho problems- Psoriatic arth & DJD knees w/ bone on bone- needs TKRs per [name];  s/p Synvisc (helped for awhile), & recent cortisone shots... recent twisting injury, taking Ultram + Tylenol, declines Vicodin Rx... she gets labs from [employer] regularly & [name] yearly... otherw stable medically- see prob list below.   Current Problem List:  ALLERGIC RHINITIS (ICD-477.9) - on XYZAL 5mg /d,  NASONEX Qhs,  ASTELIN Prn... +allergy testing in past- trees & grasses mostly...  BRONCHITIS, RECURRENT (ICD-491.9) - she's been doing well overall without recent infections etc...  VENOUS INSUFFICIENCY (ICD-459.81) - she follows a low salt diet, elevates legs, and wears support hose Prn... she's had normal ArtDopplers and neg VenDopplers in the last 9yrs...  HYPOTHYROIDISM, BORDERLINE (ICD-244.9) - followed by [name] on LEVOTHYROID 85mcg/d...  OBESITY (ICD-278.00) - she is 5\' 7"  tall and BMI at 35-38 range...  ~  weight 10/10 = 236#  ~  weight 8/11 = 255# (BMI= 37-38)  GERD (ICD-530.81) - she uses OTC Pepcid as needed...  IRRITABLE  BOWEL SYNDROME (ICD-564.1) - last colonoscopy 3/06 by [name] showed divertics, hems, prob IBS... f/u planned 67yrs.  DEGENERATIVE JOINT DISEASE (ICD-715.90) PSORIATIC ARTHROPATHY (ICD-696.0) - on ENBREL 50mg /wk, METHOTRXATE 2.5mg tabs- 6tabs/wk... followed by [name] locally and prev by [name] at [facility]... also sees [name] for [employer]... she has osteoarthritis as well, and takes TRAMADOL, TYLENOL as needed.  MIGRAINES, HX OF (ICD-V13.8) - on ATENOLOL 25mg /d for prevention,  IMITREX Prn, PHENERGAN Prn... she [name]...  PERIPHERAL NEUROPATHY (ICD-356.9) - she tried Neurontin but was INTOL "it made me stupid"...  ANXIETY (ICD-300.00) - not currently on meds.  Health Maintenance - GYN= [name]'s office... prev on Premarin, off now w/ hot flashes controlled after consult w/ [name] w/ CLONIDINE 0.1mg /d... Mammograms at [facility]... prev BMD w/ osteopenia per pt hx- on Calcium, Vits, Vit D OTC... rec to take ASA 81mg /d... FLP's in past w/ borderline LDL ~ 110 range- diet Rx...   Preventive Screening-Counseling & Management  Alcohol-Tobacco     Smoking Status: quit     Packs/Day: 1.5-2     Year Started: 1972     Year Quit: 1994  Allergies: 1)  ! Pcn 2)  ! Codeine  Comments:  Nurse/Medical Assistant: The patient's medications and allergies were reviewed with the patient and were updated in the Medication and Allergy Lists.  Past History:  Past Medical History: ALLERGIC RHINITIS (ICD-477.9) BRONCHITIS, RECURRENT (ICD-491.9) VENOUS INSUFFICIENCY (ICD-459.81) HYPOTHYROIDISM, BORDERLINE (ICD-244.9) OBESITY (ICD-278.00) GERD (ICD-530.81) IRRITABLE BOWEL SYNDROME (ICD-564.1) DEGENERATIVE  JOINT DISEASE (ICD-715.90) PSORIATIC ARTHROPATHY (ICD-696.0) MIGRAINES, HX OF (ICD-V13.8) PERIPHERAL NEUROPATHY (ICD-356.9) ANXIETY (ICD-300.00) ANEMIA (ICD-285.9)  Past Surgical History: S/P hysterectomy S/P sinus surgery by Ramiro Harvest S/P bilat cat  surg- 2000 & 2005 S/P right shoulder surg by DrNorris 2005  Family History: Reviewed history from 03/08/2009 and no changes required. Father died age 36 e/ cirrhosis (Etoh) & Tb Mother died age 74 w/ COPD, former smoker 1 Sibling: Sister w/ thyroid prob & Chol  Social History: Reviewed history from 03/08/2009 and no changes required. 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  The patient denies anorexia, fever, weight loss, vision loss, decreased hearing, hoarseness, chest pain, syncope, peripheral edema, prolonged cough, headaches, hemoptysis, abdominal pain, melena, hematochezia, severe indigestion/heartburn, hematuria, incontinence, muscle weakness, suspicious skin lesions, transient blindness, depression, unusual weight change, abnormal bleeding, enlarged lymph nodes, and angioedema.    Vital Signs:  Patient profile:   60 year old female Height:      68.5 inches Weight:      254.38 pounds BMI:     38.25 O2 Sat:      98 % on Room air Temp:     97.1 degrees F oral Pulse rate:   66 / minute BP sitting:   128 / 78  (left arm) Cuff size:   regular  Vitals Entered By: Randell Loop CMA (January 17, 2010 12:30 PM)  O2 Sat at Rest %:  98 O2 Flow:  Room air CC: 10 month ROV & review of mult medical problems... Is Patient Diabetic? No Pain Assessment Patient in pain? yes      Onset of pain  knee pain---twisted her knee last night Comments meds updated today with pt   Physical Exam  Additional Exam:  WD, Overweight, 60 y/o WF in NAD... GENERAL:  Alert & oriented; pleasant & cooperative... HEENT:  [place]/AT, EOM-wnl, PERRLA, EACs-clear, TMs-wnl, NOSE-clear, THROAT-clear & wnl. NECK:  Supple w/ fairROM; no JVD;  normal carotid impulses w/o bruits; no thyromegaly or nodules palpated; no lymphadenopathy. CHEST:  Clear to P & A; without wheezes/ rales/ or rhonchi. HEART:  Regular Rhythm; without murmurs/ rubs/ or gallops. ABDOMEN:  Obese, soft & nontender; normal bowel sounds; no organomegaly or masses detected. EXT:  severe arthritic changes in knees; no varicose veins/ +venous insuffic/ tr edema. NEURO:  CN's intact; motor testing normal; sensory testing normal; gait normal & balance OK. DERM:  few min psoriatic patches...     MISC. Report  Procedure date:  01/17/2010  Findings:      DATA REVIEWED:   ~  prev EMR notes...  ~  notes from [name] w/ labs 4/11   Impression & Recommendations:  Problem # 1:  OBESITY (ICD-278.00) We discussed the need for weight reduction> she cannot exerc sufficiently but we discussed non weight bearing exerc options and diet strategies...   Problem # 2:  DEGENERATIVE JOINT DISEASE (ICD-715.90) She has DJD and Psoriatic arthritis> followed by [name]... Her updated medication list for this problem includes:    Ultram 50 Mg Tabs (Tramadol hcl) .Marland Kitchen... Take 2 by mouth two times a day  Problem # 3:  HYPOTHYROIDISM, BORDERLINE (ICD-244.9) Followed by [name] per pt hx... Her updated medication list for this problem includes:  Levothyroxine Sodium 50 Mcg Tabs (Levothyroxine sodium) .Marland Kitchen... Take 1 tablet by mouth once a day  Problem # 4:  VENOUS INSUFFICIENCY (ICD-459.81) Stable on low sodium diet etc...  Problem # 5:  ANXIETY (ICD-300.00) Under alot of stress w/ her arthritis & its effect on work etc...  Problem # 6:  OTHER MEDICAL PROBLEMS AS NOTED>>>  Complete Medication List: 1)  Nasonex 50 Mcg/act Susp (Mometasone furoate) .Marland Kitchen.. 1-2 sprays up to twice a day as needed 2)  Astelin 137 Mcg/spray Soln (Azelastine hcl) .Marland Kitchen.. 1-2 sprays up to twice a day as needed 3)  Xyzal 5 Mg Tabs (Levocetirizine dihydrochloride) .... Take 1 tablet by mouth once a  day 4)  Atenolol 25 Mg Tabs (Atenolol) .... Take 1 tablet by mouth once a day 5)  Levothyroxine Sodium 50 Mcg Tabs (Levothyroxine sodium) .... Take 1 tablet by mouth once a day 6)  Promethazine Hcl 25 Mg Tabs (Promethazine hcl) .... As needed 7)  Enbrel 25 Mg Kit (Etanercept) .... Once a week 8)  Methotrexate 2.5 Mg Tabs (Methotrexate sodium) .... 6 tablets by mouth a week 9)  Ultram 50 Mg Tabs (Tramadol hcl) .... Take 2 by mouth two times a day 10)  Imitrex 20 Mg/act Soln (Sumatriptan) .... As needed 11)  Calcium 600 1500 Mg Tabs (Calcium carbonate) .... Take 1 tablet by mouth two times a day 12)  Daily Multiple Vitamins Tabs (Multiple vitamin) .... Take 1 tablet by mouth once a day 13)  Clonidine Hcl 0.3 Mg Tabs (Clonidine hcl) .... Take 1 tablet by mouth once a day  Patient Instructions: 1)  Today we updated your med list- see below.... 2)  Continue your current meds the same... 3)  Continue your regular f/u w/ DrDevshwar & [name]... 4)  Good luck w/ the up coming knee surgery.Marland KitchenMarland Kitchen 5)  Call for any problems.Marland KitchenMarland Kitchen 6)  Please schedule a follow-up appointment in 1 year, sooner as needed.

## 2010-07-10 NOTE — Progress Notes (Signed)
Summary: rov w/ sn asap  Phone Note Call from Patient Call back at Home Phone [phone]   Caller: Patient Call For: nadel Summary of Call: pt c/o fatigue/ anemia (which she says dr Kriste Basque is aware of- she had these signs befor knee replacement surgery. also is having difficulty sleeping. requests to see dr Kriste Basque asap. (1st avail is in feb- pt wants asap w/ nadel).  Initial call taken by: Tivis Ringer, CNA,  June 17, 2010 2:15 PM  Follow-up for Phone Call        Spoke with pt.  She is c/o fatigue over the past several wks.  Last time she had HGB checked it was 8.8- this was approx 3 wks ago.  She states that she fatigued during the day but unable to sleep at night. She requests ov with SN this month.  I advised nothing avail until Feb, she can see TP.  Pt okay with this so sched her to see TP 06/19/10 at 2:45 pm. Follow-up by: Vernie Murders,  June 17, 2010 2:41 PM

## 2010-07-10 NOTE — Progress Notes (Signed)
Summary: flu symptoms  Phone Note Call from Patient   Caller: Patient Call For: nadel Summary of Call: pt got flu shot last fri. today c/o "flu like symptoms" weak and vomiting. unable to keep anything down. [facility] [address]. [phone] Initial call taken by: Tivis Ringer, CNA,  March 11, 2010 3:54 PM  Follow-up for Phone Call        [facility] TCB x1.  has she been treating her symptoms?  we don't usually make appts for flu symptoms. Boone Master CNA/MA  March 11, 2010 4:48 PM   Pt states that she was having some N/V yesterday, but feel smuch better today and does not need anything. I advised to call back if she needs anything further. Carron Curie CMA  March 12, 2010 4:39 PM

## 2010-07-14 ENCOUNTER — Encounter: Payer: Self-pay | Admitting: Pulmonary Disease

## 2010-08-05 NOTE — Letter (Signed)
Summary: [facility]  [facility]   Imported By: Sherian Rein 07/29/2010 14:03:29  _____________________________________________________________________  External Attachment:    Type:   Image     Comment:   External Document

## 2010-08-19 LAB — BASIC METABOLIC PANEL
BUN: 3 mg/dL — ABNORMAL LOW (ref 6–23)
BUN: 6 mg/dL (ref 6–23)
CO2: 28 mEq/L (ref 19–32)
Calcium: 7.9 mg/dL — ABNORMAL LOW (ref 8.4–10.5)
Calcium: 7.9 mg/dL — ABNORMAL LOW (ref 8.4–10.5)
Calcium: 8.1 mg/dL — ABNORMAL LOW (ref 8.4–10.5)
Chloride: 105 mEq/L (ref 96–112)
Creatinine, Ser: 0.85 mg/dL (ref 0.4–1.2)
Creatinine, Ser: 0.92 mg/dL (ref 0.4–1.2)
GFR calc Af Amer: >60 mL/min (ref >60)
GFR calc Af Amer: >60 mL/min (ref >60)
GFR calc non Af Amer: >60 mL/min (ref >60)
GFR calc non Af Amer: >60 mL/min (ref >60)
GFR calc non Af Amer: >60 mL/min (ref >60)
Glucose, Bld: 120 mg/dL — ABNORMAL HIGH (ref 70–99)
Potassium: 3.9 mEq/L (ref 3.5–5.1)
Sodium: 141 mEq/L (ref 135–145)
Sodium: 141 mEq/L (ref 135–145)

## 2010-08-19 LAB — URINALYSIS, ROUTINE W REFLEX MICROSCOPIC
Bilirubin Urine: NEGATIVE
Glucose, UA: NEGATIVE mg/dL
Hgb urine dipstick: NEGATIVE
Ketones, ur: NEGATIVE mg/dL
Nitrite: NEGATIVE
Protein, ur: NEGATIVE mg/dL
Urobilinogen, UA: 0.2 mg/dL (ref 0.0–1.0)
pH: 6 (ref 5.0–8.0)

## 2010-08-19 LAB — CBC
HCT: 39.1 % (ref 36.0–46.0)
Hemoglobin: 13.5 g/dL (ref 12.0–15.0)
Hemoglobin: 9.6 g/dL — ABNORMAL LOW (ref 12.0–15.0)
Hemoglobin: 9.8 g/dL — ABNORMAL LOW (ref 12.0–15.0)
MCH: 31.6 pg (ref 26.0–34.0)
MCH: 31.8 pg (ref 26.0–34.0)
MCH: 31.9 pg (ref 26.0–34.0)
MCH: 32 pg (ref 26.0–34.0)
MCHC: 34.3 g/dL (ref 30.0–36.0)
MCHC: 34.5 g/dL (ref 30.0–36.0)
MCHC: 34.5 g/dL (ref 30.0–36.0)
MCV: 92.1 fL (ref 78.0–100.0)
MCV: 92.9 fL (ref 78.0–100.0)
Platelets: 116 10*3/uL — ABNORMAL LOW (ref 150–400)
Platelets: 136 10*3/uL — ABNORMAL LOW (ref 150–400)
Platelets: 184 10*3/uL (ref 150–400)
Platelets: 203 10*3/uL (ref 150–400)
Platelets: 226 10*3/uL (ref 150–400)
RBC: 2.85 MIL/uL — ABNORMAL LOW (ref 3.87–5.11)
RBC: 2.99 MIL/uL — ABNORMAL LOW (ref 3.87–5.11)
RBC: 3.18 MIL/uL — ABNORMAL LOW (ref 3.87–5.11)
RDW: 13.8 % (ref 11.5–15.5)
RDW: 13.9 % (ref 11.5–15.5)
RDW: 13.9 % (ref 11.5–15.5)
RDW: 14 % (ref 11.5–15.5)
WBC: 11 10*3/uL — ABNORMAL HIGH (ref 4.0–10.5)
WBC: 11.5 10*3/uL — ABNORMAL HIGH (ref 4.0–10.5)
WBC: 12.1 10*3/uL — ABNORMAL HIGH (ref 4.0–10.5)
WBC: 5.6 10*3/uL (ref 4.0–10.5)

## 2010-08-19 LAB — PROTIME-INR
INR: 1.24 (ref 0.00–1.49)
INR: 1.38 (ref 0.00–1.49)
INR: 1.77 — ABNORMAL HIGH (ref 0.00–1.49)
Prothrombin Time: 13.6 seconds (ref 11.6–15.2)
Prothrombin Time: 15.8 seconds — ABNORMAL HIGH (ref 11.6–15.2)
Prothrombin Time: 17.2 seconds — ABNORMAL HIGH (ref 11.6–15.2)
Prothrombin Time: 20.8 seconds — ABNORMAL HIGH (ref 11.6–15.2)
Prothrombin Time: 24.1 seconds — ABNORMAL HIGH (ref 11.6–15.2)

## 2010-08-19 LAB — SURGICAL PCR SCREEN
MRSA, PCR: NEGATIVE
Staphylococcus aureus: NEGATIVE

## 2010-08-19 LAB — COMPREHENSIVE METABOLIC PANEL
ALT: 37 U/L — ABNORMAL HIGH (ref 0–35)
AST: 38 U/L — ABNORMAL HIGH (ref 0–37)
Alkaline Phosphatase: 73 U/L (ref 39–117)
CO2: 25 mEq/L (ref 19–32)
GFR calc Af Amer: >60 mL/min (ref >60)
GFR calc non Af Amer: 54 mL/min — ABNORMAL LOW (ref >60)
Glucose, Bld: 77 mg/dL (ref 70–99)
Potassium: 4 mEq/L (ref 3.5–5.1)
Sodium: 137 mEq/L (ref 135–145)
Total Protein: 6.7 g/dL (ref 6.0–8.3)

## 2010-08-19 LAB — TYPE AND SCREEN: Antibody Screen: NEGATIVE

## 2010-08-19 LAB — ABO/RH: ABO/RH(D): B NEG

## 2010-09-16 ENCOUNTER — Encounter: Payer: Self-pay | Admitting: Adult Health

## 2010-09-18 ENCOUNTER — Encounter: Payer: Self-pay | Admitting: Adult Health

## 2010-09-18 ENCOUNTER — Ambulatory Visit (INDEPENDENT_AMBULATORY_CARE_PROVIDER_SITE_OTHER): Payer: BC Managed Care – PPO | Admitting: Adult Health

## 2010-09-18 DIAGNOSIS — G4700 Insomnia, unspecified: Secondary | ICD-10-CM

## 2010-09-18 MED ORDER — ZOLPIDEM TARTRATE ER 12.5 MG PO TBCR: 12.5000 mg | EXTENDED_RELEASE_TABLET | Freq: Every evening | ORAL | Status: DC | PRN

## 2010-09-18 NOTE — Patient Instructions (Addendum)
Work on sleep hygiene see helpful hints below Try Ambien 12.5mg  CR At bedtime  As needed  For insomnia follow up Dr. Kriste Basque  As planned and As needed   Please contact office for sooner follow up if symptoms do not improve or worsen or seek emergency care       Insomnia Insomnia is frequent trouble falling and/or staying asleep. Insomnia can be a long term problem or a short term problem. Both are common. Insomnia can be a short term problem when the wakefulness is related to a certain stress or worry. Long term insomnia is often related to ongoing stress during waking hours and/or poor sleeping habits. Overtime, sleep deprivation itself can make the problem worse. Every little thing feels more severe because you are overtired and your ability to cope is decreased. SYMPTOMS  Not feeling rested in the morning.   Anxiety and restlessness at bedtime.   Difficulty falling and staying asleep.  CAUSES  Stress, anxiety, and depression.   Poor sleeping habits.   Distractions such as TV in the bedroom.   Naps close to bedtime.   Engaging in emotionally charged conversations before bed.   Technical reading before sleep.   Alcohol and other sedatives. They may make the problem worse. They can hurt normal sleep patterns and normal dream activity.   Stimulants such as caffeine for several hours prior to bedtime.   Pain syndromes and shortness of breath can cause insomnia.   Exercise late at night.   Changing time zones may cause sleeping problems (jet lag).  It is sometimes helpful to have someone observe your sleeping patterns. They should look for periods of not breathing during the night (sleep apnea). They should also look to see how long those periods last. If you live alone or observers are uncertain, you can also be observed at a sleep clinic where your sleep patterns will be professionally monitored. Sleep apnea requires a checkup and treatment. Give your caregivers your medical  history. Give your caregivers observations your family has made about your sleep.   TREATMENT  Your caregiver may prescribe treatment for an underlying medical disorders. Your caregiver can give advice or help if you are using alcohol or other drugs for self-medication. Treatment of underlying problems will usually eliminate insomnia problems.   Medications can be prescribed for short time use. They are generally not recommended for lengthy use.   Over-the-counter sleep medicines are not recommended for lengthy use. They can be habit forming.   You can promote easier sleeping by making lifestyle changes such as:   Using relaxation techniques that help with breathing and reduce muscle tension.   Exercising earlier in the day.   Changing your diet and the time of your last meal. No night time snacks.   Establish a regular time to go to bed.   Counseling can help with stressful problems and worry.   Soothing music and white noise may be helpful if there are background noises you cannot remove.   Stop tedious detailed work at least one hour before bedtime.  HOME CARE INSTRUCTIONS  Keep a diary. Inform your caregiver about your progress. This includes any medication side effects. See your caregiver regularly. Take note of:   Times when you are asleep.   Times when you are awake during the night.      The quality of your sleep.   How you feel the next day.      This information will help your caregiver care for  you.  Get out of bed if you are still awake after 15 minutes. Read or do some quiet activity. Keep the lights down. Wait until you feel sleepy and go back to bed.   Keep regular sleeping and waking hours. Avoid naps.   Exercise regularly.   Avoid distractions at bedtime. Distractions include watching television or engaging in any intense or detailed activity like attempting to balance the household checkbook.   Develop a bedtime ritual. Keep a familiar routine of  bathing, brushing your teeth, climbing into bed at the same time each night, listening to soothing music. Routines increase the success of falling to sleep faster.   Use relaxation techniques. This can be using breathing and muscle tension release routines. It can also include visualizing peaceful scenes. You can also help control troubling or intruding thoughts by keeping your mind occupied with boring or repetitive thoughts like the old concept of counting sheep. You can make it more creative like imagining planting one beautiful flower after another in your backyard garden.   During your day, work to eliminate stress. When this is not possible use some of the previous suggestions to help reduce the anxiety that accompanies stressful situations.  MAKE SURE YOU:    Understand these instructions.   Will watch your condition.   Will get help right away if you are not doing well or get worse.  Document Released: 05/22/2000 Document Re-Released: 05/07/2008 [facility] Patient Information 2011 [place], [place].

## 2010-09-19 NOTE — Progress Notes (Signed)
Subjective:    Patient ID: Sarah Horton, female    DOB: 08/15/1950, 60 y.o.   MRN: [id number]  HPI 60 year old female with known history of Psoriatic Arthritis, Allergic Rhinitis, HTN.   ~ March 08, 2009: she states that she has been doing well and has no new complaints or concerns... she is followed by [name] for Rheum- w/ psoriatic arthritis on Enbrel/ MTX... she reports incr LFT's prob due to Celebrex & she is off this NSAID... followed by [name] for Endocrine on Levoxyl 73mcg/d now "it was up to 3.7 & she started meds because of low temp & my hair falling out"... also followed by [name] for HA's on Aten, Imitrex, Phenergan (she states HA's reduced off Premarin)... she gets labs every 6 weeks from [name] & doesn't want additional lab work here... requests DCN100 & Flu shot.   October 02, 2009--Presents for an acute office visit. Complains of sinus pressure/congestion, yellow/green nasal drainage, PND x2weeks. OTC not helping. Recent labs at [facility] showed slightly low HGB 11.8. , prev. 12.8 on 2009, last colon 2006 w/ divertics. Denies chest pain, dyspnea, orthopnea, hemoptysis, fever, n/v/d, edema, headache, bloody stools.   June 19, 2010--Prsents for work in visit. Pt had recent knee sugery- s/p bilateral TKR 11/23. She continues to go thru PT. Has been having trouble sleeping since surgery. Waking up frequently, trouble staying asleep. Tired in am. She denies chest pain , gerd, orthopnea or dyspnea. Has used ambien in past-which worked good. Has noticed since surgery that she is loosing more hair than usual. Face has dry patches on cheeks. No painful rash or new meds.She has changed face creams without much help>>Rx ambien 5mg  At bedtime  As needed    09/18/10 Follow up  Pt returns for 3 month follow up for difficulty sleeping.  states ambien given at last ov did not help, stopped taking approx 1 month ago.  states an old rx for klonopin does help . PT used ambien 5mg   At bedtime  For persistent insomnia. Helped intermittent but had late onset and only last for couple of hours. She has attempted multiple lifestyle modification for healthy sleep regimen. Mainly she has trouble falling asleep and staying asleep. She quit ambien 1 month ago and over last 2 weeks barely sleeping any. She took some left over klonopin which helped her to get to sleep faster but she did not remain asleep. We discussed several options and decided on extended release ambien. Labs last ov were unrevealing. She has minimal caffeine intake in am very early with coffee only. No decongestants.  No snoring. Does feel tired during daytime.     Review of Systems Constitutional:   No  weight loss, night sweats,  Fevers, chills,   HEENT:   No headaches,  Difficulty swallowing,  Tooth/dental problems, or  Sore throat,                No sneezing, itching, ear ache, nasal congestion, post nasal drip,   CV:  No chest pain,  Orthopnea, PND, swelling in lower extremities, anasarca, dizziness, palpitations, syncope.   GI  No heartburn, indigestion, abdominal pain, nausea, vomiting, diarrhea, change in bowel habits, loss of appetite, bloody stools.   Resp: No shortness of breath with exertion or at rest.  No excess mucus, no productive cough,  No non-productive cough,  No coughing up of blood.  No change in color of mucus.  No wheezing.  No chest wall deformity  Skin: no rash or  lesions.  GU: no dysuria, change in color of urine, no urgency or frequency.  No flank pain, no hematuria   MS:  No joint pain or swelling.  No decreased range of motion.  No back pain.  Psych:  No change in mood or affect. No depression or anxiety.  No memory loss. +insomnia          Objective:   Physical Exam GEN: A/Ox3; pleasant , NAD, well nourished   HEENT:  [place]/AT,  EACs-clear, TMs-wnl, NOSE-clear, THROAT-clear, no lesions, no postnasal drip or exudate noted.   NECK:  Supple w/ fair ROM; no JVD; normal  carotid impulses w/o bruits; no thyromegaly or nodules palpated; no lymphadenopathy.  RESP  Clear  P & A; w/o, wheezes/ rales/ or rhonchi.no accessory muscle use, no dullness to percussion  CARD:  RRR, no m/r/g  , no peripheral edema, pulses intact, no cyanosis or clubbing.  GI:   Soft & nt; nml bowel sounds; no organomegaly or masses detected.  Musco: Warm bil, no deformities or joint swelling noted.   Neuro: alert, no focal deficits noted.    Skin: Warm, no lesions or rashes          Assessment & Plan:

## 2010-09-19 NOTE — Assessment & Plan Note (Addendum)
Persistent Insomnia resistant to treatment.  Reinforced healthy sleep hygiene If not improved on return consider sleep study to r/o sleep disorder   Plan:    Try Ambien 12.5mg  CR At bedtime  As needed  For insomnia follow up Dr. Kriste Basque  As planned and As needed   Please contact office for sooner follow up if symptoms do not improve or worsen or seek emergency care

## 2010-10-20 ENCOUNTER — Telehealth: Payer: Self-pay | Admitting: Adult Health

## 2010-10-20 MED ORDER — ZOLPIDEM TARTRATE ER 12.5 MG PO TBCR: 12.5000 mg | EXTENDED_RELEASE_TABLET | Freq: Every evening | ORAL | Status: DC | PRN

## 2010-10-20 NOTE — Telephone Encounter (Signed)
Addended by: Vernie Murders on: 10/20/2010 05:40 PM   Modules accepted: Orders

## 2010-10-20 NOTE — Telephone Encounter (Signed)
Pls advise if okay to refill Sarah Horton, this was started at last ov on 09/18/10 thanks

## 2010-10-20 NOTE — Telephone Encounter (Signed)
That is fine per Dr. Kriste Basque  Can refill x 5  thanx

## 2010-10-20 NOTE — Telephone Encounter (Signed)
Can have 5 refills per Dr. Kriste Basque   Ov as plannned with Dr. Kriste Basque

## 2010-10-20 NOTE — Telephone Encounter (Signed)
Spoke with pt and notified rx will be called to pharm with 5 refills. OV with SN sched for 11/24/10 at 2:30 pm.

## 2010-11-24 ENCOUNTER — Ambulatory Visit (INDEPENDENT_AMBULATORY_CARE_PROVIDER_SITE_OTHER): Payer: BC Managed Care – PPO | Admitting: Pulmonary Disease

## 2010-11-24 ENCOUNTER — Encounter: Payer: Self-pay | Admitting: Pulmonary Disease

## 2010-11-24 DIAGNOSIS — Z87898 Personal history of other specified conditions: Secondary | ICD-10-CM

## 2010-11-24 DIAGNOSIS — L4050 Arthropathic psoriasis, unspecified: Secondary | ICD-10-CM

## 2010-11-24 DIAGNOSIS — K219 Gastro-esophageal reflux disease without esophagitis: Secondary | ICD-10-CM

## 2010-11-24 DIAGNOSIS — G4700 Insomnia, unspecified: Secondary | ICD-10-CM

## 2010-11-24 DIAGNOSIS — J309 Allergic rhinitis, unspecified: Secondary | ICD-10-CM

## 2010-11-24 DIAGNOSIS — K589 Irritable bowel syndrome without diarrhea: Secondary | ICD-10-CM

## 2010-11-24 DIAGNOSIS — F411 Generalized anxiety disorder: Secondary | ICD-10-CM

## 2010-11-24 DIAGNOSIS — G609 Hereditary and idiopathic neuropathy, unspecified: Secondary | ICD-10-CM

## 2010-11-24 DIAGNOSIS — E669 Obesity, unspecified: Secondary | ICD-10-CM

## 2010-11-24 DIAGNOSIS — M1990 Unspecified osteoarthritis, unspecified site: Secondary | ICD-10-CM

## 2010-11-24 DIAGNOSIS — J42 Unspecified chronic bronchitis: Secondary | ICD-10-CM

## 2010-11-24 DIAGNOSIS — E039 Hypothyroidism, unspecified: Secondary | ICD-10-CM

## 2010-11-24 MED ORDER — SUMATRIPTAN 20 MG/ACT NA SOLN: 1.0000 | NASAL | Status: DC | PRN

## 2010-11-24 NOTE — Progress Notes (Signed)
Subjective:    Patient ID: Sarah Horton, female    DOB: 1950-08-12, 60 y.o.   MRN: [id number]  HPI 60 y/o WF here for a follow up visit... she has multiple medical problems as noted below...    ~  March 08, 2009 : she states that she has been doing well and has no new complaints or concerns... she is followed by [name] for Rheum- w/ psoriatic arthritis on Enbrel/ MTX... she reports incr LFT's prob due to Celebrex & she is off this NSAID... followed by [name] for Endocrine on Levoxyl 75mcg/d now "it was up to 3.7 & she started meds because of low temp & my hair falling out"... also followed by [name] for HA's on Aten, Imitrex, Phenergan (she states HA's reduced off Premarin)... she gets labs every 6 weeks from [name] & doesn't want additional lab work here... requests DCN100 & Flu shot.  ~  January 17, 2010:  mult Ortho problems- Psoriatic arth & DJD knees w/ bone on bone- needs TKRs per [name];  s/p Synvisc (helped for awhile), & recent cortisone shots... recent twisting injury, taking Ultram + Tylenol, declines Vicodin Rx... she gets labs from [employer] regularly & [name] yearly... otherw stable medically- see prob list below.  ~  November 24, 2010:  72mo ROV & she had bilat TKRs by [name] 11/11> lots of rehab & 38mo out of work but after all it was worth it she says & much improved now w/ 43# wt loss as a frige benefit;  She still follows up w/ [name] for her Psoriatic Arthritis on ENBREL (off MTX for now) & she does labs every 3 months> asked to add FLP, TSH, etc at next lab draw w/ copy to me;  She notes her biggest post op prob was insomnia & no better w/ Ambien 10mg  & it took AMBIEN CR 12.5mg  to help her rest;  She would like Rx for IMITREX nasal spray for her HAs refilled from [place] today...   Problem List:  ALLERGIC RHINITIS (ICD-477.9) - on XYZAL 5mg /d,  NASONEX Qhs,  ASTELIN Prn... +allergy testing in past- trees & grasses mostly...  BRONCHITIS, RECURRENT  (ICD-491.9) - she's been doing well overall without recent infections etc...  VENOUS INSUFFICIENCY (ICD-459.81) - she follows a low salt diet, elevates legs, and wears support hose Prn... she's had normal ArtDopplers and neg VenDopplers in the last 46yrs...  HYPOTHYROIDISM, BORDERLINE (ICD-244.9) - followed by [name] on LEVOTHYROID 94mcg/d;  Pt states tha she checks pt once per yr and does her thyroid labs...  OBESITY (ICD-278.00) - she is 5\' 7"  tall and BMI at 35-38 range... ~  weight 10/10 = 236# ~  weight 8/11 = 255# (BMI= 37-38) ~  Weight 6/12 = 211# (BMI= 33)  GERD (ICD-530.81) - she uses OTC Pepcid as needed...  IRRITABLE BOWEL SYNDROME (ICD-564.1) - last colonoscopy 3/06 by [name] showed divertics, hems, prob IBS... f/u planned 98yrs.  DEGENERATIVE JOINT DISEASE (ICD-715.90) - she has osteoarthritis and takes TRAMADOL  & TYLENOL as needed. ~  11/11: she had bilat TKRs done by [name]> off work x 38mo, lots of PT, & finally improved.  PSORIATIC ARTHROPATHY (ICD-696.0) - on ENBREL 50mg /wk, (off prev MTX since her knee surg 11/11)... followed by [name] locally and prev [name]... also sees [name] for [employer]...  MIGRAINES, HX OF (ICD-V13.8) - on ATENOLOL 25mg /d for prevention,  IMITREX Prn, PHENERGAN Prn... she [name]...  PERIPHERAL NEUROPATHY (ICD-356.9) - she tried  Neurontin but was INTOL "it made me stupid"...  ANXIETY (ICD-300.00) - not currently on meds.  Health Maintenance - GYN= [name]'s office... prev on Premarin, off now w/ hot flashes controlled after consult w/ [name] w/ CLONIDINE 0.1mg /d... Mammograms at [facility]... prev BMD w/ osteopenia per pt hx- on Calcium, Vits, Vit D OTC... rec to take ASA 81mg /d... FLP's in past w/ borderline LDL~ 110 range- diet Rx...   Past Surgical History  Procedure Date  . Cataract extraction, bilateral   . Nasal sinus surgery   . Abdominal hysterectomy   . Shoulder surgery      Outpatient Encounter Prescriptions as of 11/24/2010  Medication Sig Dispense Refill  . azelastine (ASTELIN) 137 MCG/SPRAY nasal spray 1 spray by Nasal route 2 (two) times daily. Use in each nostril as directed       . calcium carbonate (OS-CAL) 600 MG TABS Take 600 mg by mouth 2 (two) times daily with a meal.        . etanercept (ENBREL) 25 MG injection Inject into the skin once.       Marland Kitchen levocetirizine (XYZAL) 5 MG tablet Take 5 mg by mouth every evening.        Marland Kitchen levothyroxine (SYNTHROID, LEVOTHROID) 50 MCG tablet Take 50 mcg by mouth daily.        . methocarbamol (ROBAXIN) 500 MG tablet 1-2 tablets by mouth daily as needed       . mometasone (NASONEX) 50 MCG/ACT nasal spray 2 sprays by Nasal route daily.        . Multiple Vitamin (MULTIVITAMIN) tablet Take 1 tablet by mouth daily.        . promethazine (PHENERGAN) 25 MG tablet Take 25 mg by mouth every 6 (six) hours as needed.        . SUMAtriptan (IMITREX) 20 MG/ACT nasal spray 1 spray by Nasal route every 2 (two) hours as needed.        . traMADol (ULTRAM) 50 MG tablet 2 tablets by mouth two times a day as needed       . zolpidem (AMBIEN CR) 12.5 MG CR tablet Take 1 tablet (12.5 mg total) by mouth at bedtime as needed for sleep.  30 tablet  5  . methotrexate (RHEUMATREX) 2.5 MG tablet Caution:Chemotherapy. Protect from light. 6 tablets by mouth a week HOLD         Allergies  Allergen Reactions  . Codeine     REACTION: severe nausea by itself  . Penicillins     REACTION: hives all over    Review of Systems         See HPI - all other systems neg except as noted...   The patient complains of weight gain, dyspnea on exertion, and difficulty walking.  The patient denies anorexia, fever, weight loss, vision loss, decreased hearing, hoarseness, chest pain, syncope, peripheral edema, prolonged cough, headaches, hemoptysis, abdominal pain, melena, hematochezia, severe indigestion/heartburn, hematuria, incontinence, muscle weakness,  suspicious skin lesions, transient blindness, depression, unusual weight change, abnormal bleeding, enlarged lymph nodes, and angioedema.    Objective:   Physical Exam     WD, Overweight, 60 y/o WF in NAD... GENERAL:  Alert & oriented; pleasant & cooperative... HEENT:  [place]/AT, EOM-wnl, PERRLA, EACs-clear, TMs-wnl, NOSE-clear, THROAT-clear & wnl. NECK:  Supple w/ fairROM; no JVD; normal carotid impulses w/o bruits; no thyromegaly or nodules palpated; no lymphadenopathy. CHEST:  Clear to P & A; without wheezes/ rales/ or rhonchi. HEART:  Regular Rhythm; without murmurs/ rubs/ or gallops.  ABDOMEN:  Obese, soft & nontender; normal bowel sounds; no organomegaly or masses detected. EXT:  severe arthritic changes in knees; no varicose veins/ +venous insuffic/ tr edema. NEURO:  CN's intact; motor testing normal; sensory testing normal; gait normal & balance OK. DERM:  few min psoriatic patches...    Assessment & Plan:   AR/ Bronchitis>  Stable on antihist, nasal steroid, etc; she denies recent resp exac...  Ven Insuffic>  She did beautifully after her knee surg, no DVT, swelling, etc...  HYPOTHYROID>  Followed by [name] & she does her labs...  OBESITY>  Fabulous job w/ wt down 47# to 211 so far...  GI>  GERD/ IBS>  follwed by [name], up to date, & stable...  DJD/ PSORIATIC Arthritis>  follwed by [name] & [name] on ENBREL Rx...  Migraine HA>  Hx migraines but stable & quiescent; she request refill IMITREX nasal sp...  Other medical problems as noted>>> pt will get labs at [facility] w/ copy to me.Marland KitchenMarland Kitchen

## 2010-11-24 NOTE — Patient Instructions (Signed)
Today we updated your med list in EPIC>    We refilled your IMITREX per request...  We reviewed your Surgical hx, Lab data, & XRays done over the last yr by your specialists...  Call for any questions...  Let's plan a routine follow up in 1 yr, sooner as needed for problems.Marland KitchenMarland Kitchen

## 2011-03-23 DIAGNOSIS — Z23 Encounter for immunization: Secondary | ICD-10-CM

## 2011-03-24 ENCOUNTER — Ambulatory Visit (INDEPENDENT_AMBULATORY_CARE_PROVIDER_SITE_OTHER): Payer: BC Managed Care – PPO

## 2011-03-24 DIAGNOSIS — Z23 Encounter for immunization: Secondary | ICD-10-CM

## 2011-11-11 ENCOUNTER — Ambulatory Visit: Payer: BC Managed Care – PPO | Admitting: Pulmonary Disease

## 2011-11-14 IMAGING — CR DG CHEST 2V
2 series · 2 of 2 positions shown · non-contrast
Comparison: 03/08/2008

CLINICAL DATA: Preoperative respiratory exam for knee surgery.
Smoking history.

CHEST - 2 VIEW

[w chest pa]
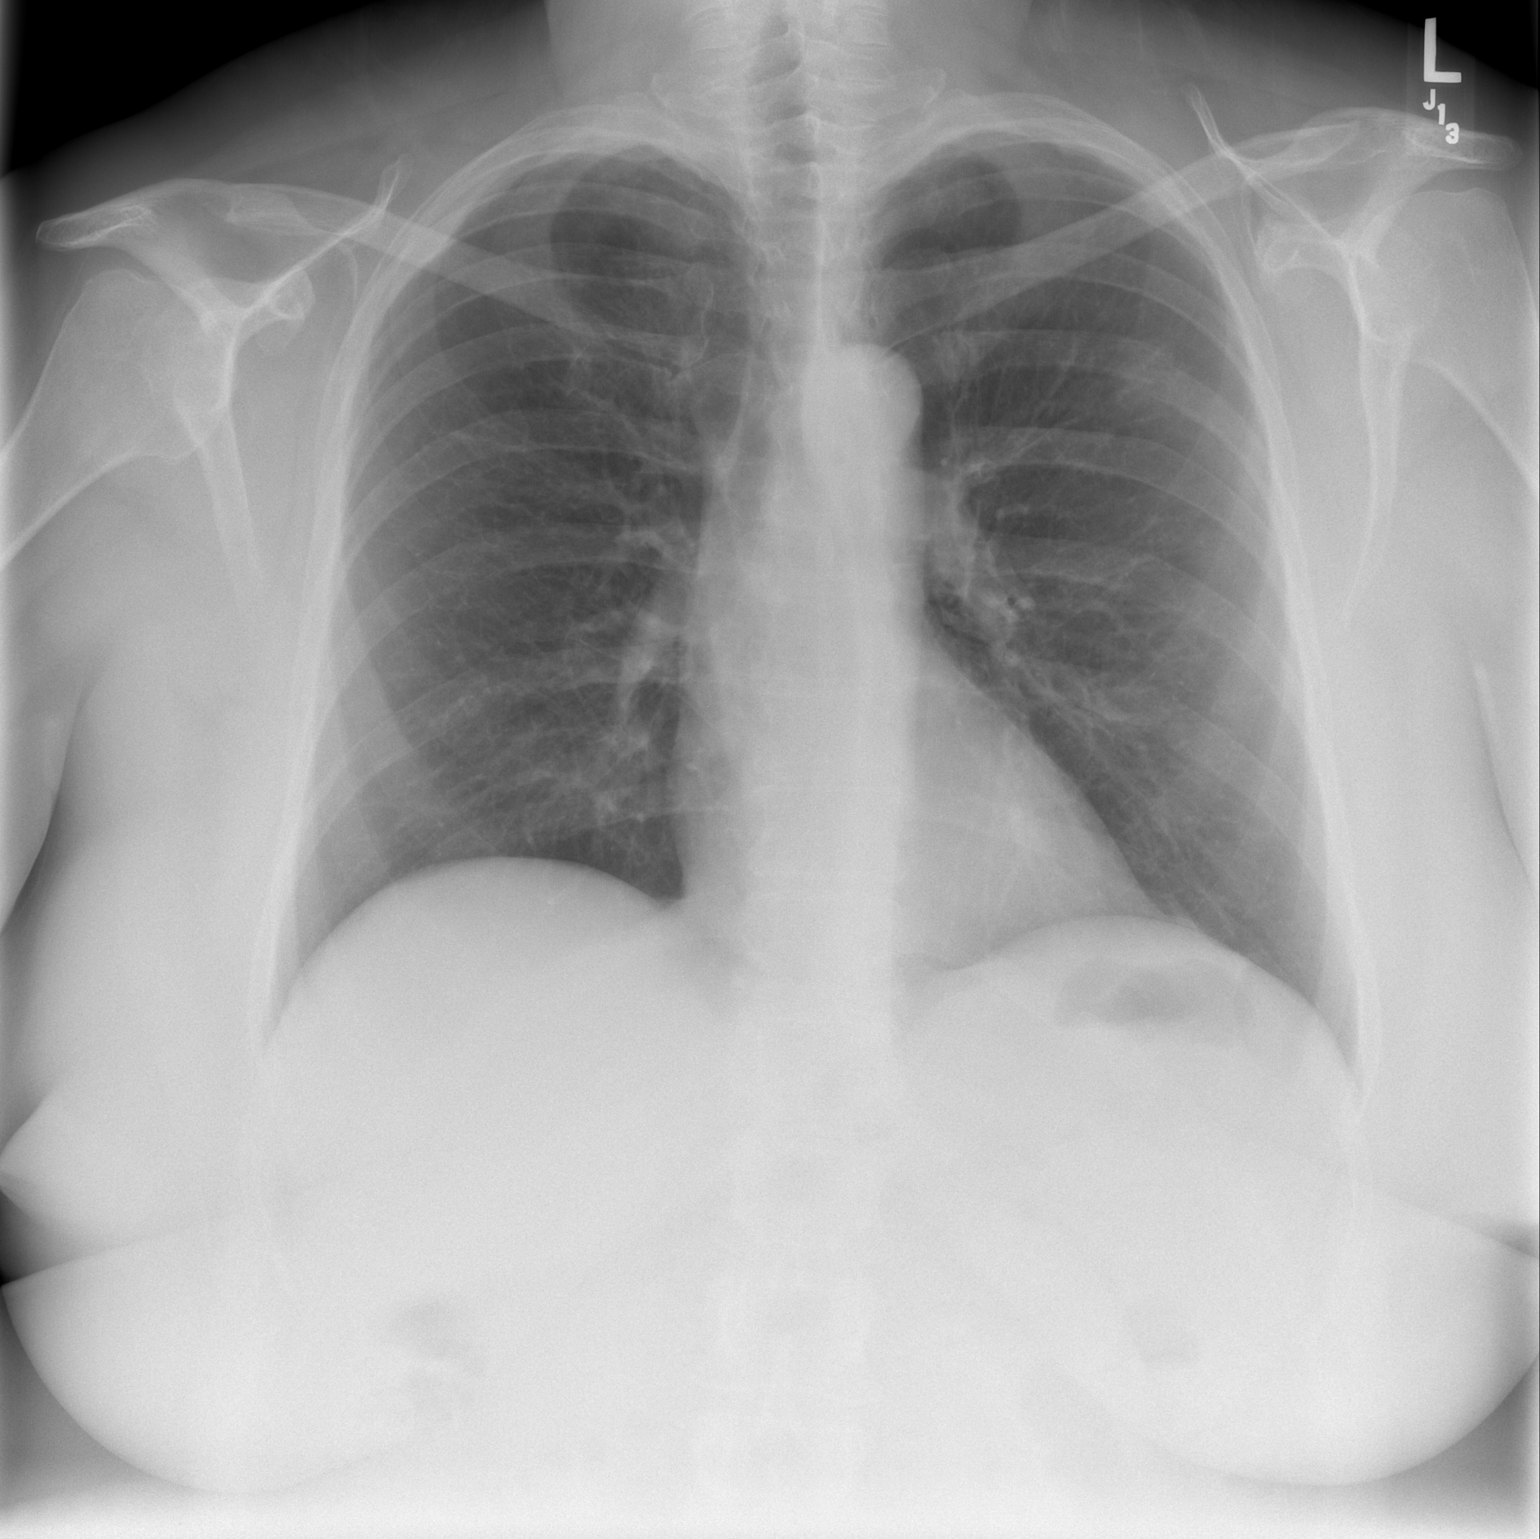

[w chest lat]
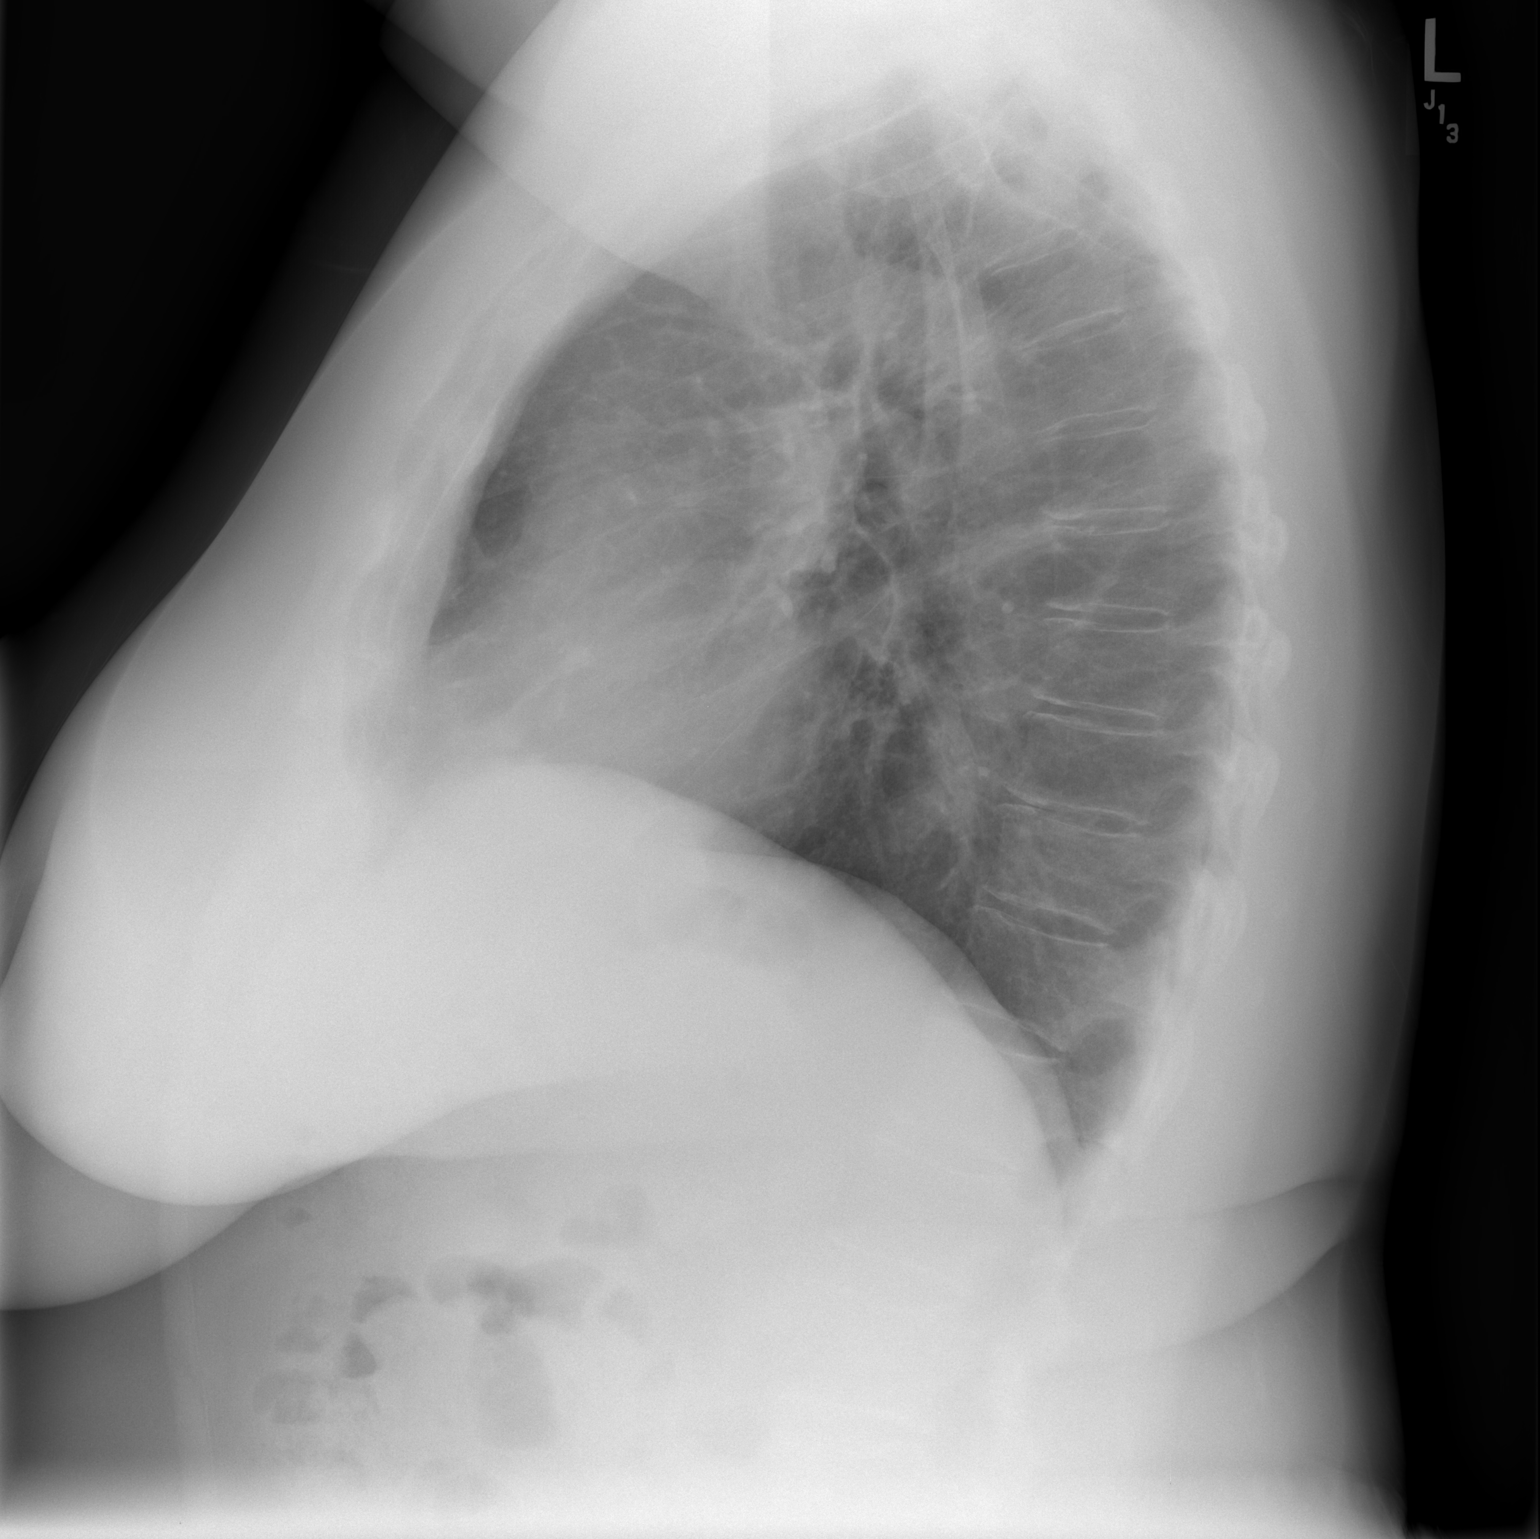

[2 of 2 positions shown; findings below may reference images not displayed]

FINDINGS: Heart size is normal.  Mediastinal shadows are normal.
The lungs  are clear.  There is a healing fracture of the left
posterior sixth rib.  No effusions.  No other abnormal bony
finding.
IMPRESSION: No active cardiopulmonary disease.  Healing fracture of the left
posterior sixth rib.

## 2011-12-14 ENCOUNTER — Encounter: Payer: Self-pay | Admitting: Pulmonary Disease

## 2011-12-14 ENCOUNTER — Ambulatory Visit (INDEPENDENT_AMBULATORY_CARE_PROVIDER_SITE_OTHER): Payer: BC Managed Care – PPO | Admitting: Pulmonary Disease

## 2011-12-14 VITALS — BP 118/82 | HR 77 | Temp 96.9°F | Ht 68.5 in | Wt 239.6 lb

## 2011-12-14 DIAGNOSIS — Z87898 Personal history of other specified conditions: Secondary | ICD-10-CM

## 2011-12-14 DIAGNOSIS — E039 Hypothyroidism, unspecified: Secondary | ICD-10-CM

## 2011-12-14 DIAGNOSIS — J309 Allergic rhinitis, unspecified: Secondary | ICD-10-CM

## 2011-12-14 DIAGNOSIS — F411 Generalized anxiety disorder: Secondary | ICD-10-CM

## 2011-12-14 DIAGNOSIS — E669 Obesity, unspecified: Secondary | ICD-10-CM

## 2011-12-14 DIAGNOSIS — G609 Hereditary and idiopathic neuropathy, unspecified: Secondary | ICD-10-CM

## 2011-12-14 DIAGNOSIS — M1990 Unspecified osteoarthritis, unspecified site: Secondary | ICD-10-CM

## 2011-12-14 DIAGNOSIS — K219 Gastro-esophageal reflux disease without esophagitis: Secondary | ICD-10-CM

## 2011-12-14 DIAGNOSIS — I872 Venous insufficiency (chronic) (peripheral): Secondary | ICD-10-CM

## 2011-12-14 DIAGNOSIS — L4050 Arthropathic psoriasis, unspecified: Secondary | ICD-10-CM

## 2011-12-14 DIAGNOSIS — K589 Irritable bowel syndrome without diarrhea: Secondary | ICD-10-CM

## 2011-12-14 MED ORDER — SUMATRIPTAN 20 MG/ACT NA SOLN: 1.0000 | NASAL | Status: DC | PRN

## 2011-12-14 MED ORDER — PROMETHAZINE HCL 25 MG PO TABS: 25.0000 mg | ORAL_TABLET | Freq: Four times a day (QID) | ORAL | Status: DC | PRN

## 2011-12-14 NOTE — Progress Notes (Signed)
Subjective:    Patient ID: Sarah Horton, female    DOB: [birth year], 61 y.o.   MRN: [id number]  HPI 61 y/o WF here for a follow up visit... she has multiple medical problems as noted below...    ~  March 08, 2009 : she states that she has been doing well and has no new complaints or concerns... she is followed by [name] for Rheum- w/ psoriatic arthritis on Enbrel/ MTX... she reports incr LFT's prob due to Celebrex & she is off this NSAID... followed by [name] for Endocrine on Levoxyl 17mcg/d now "it was up to 3.7 & she started meds because of low temp & my hair falling out"... also followed by [name] for HA's on Aten, Imitrex, Phenergan (she states HA's reduced off Premarin)... she gets labs every 6 weeks from [name] & doesn't want additional lab work here... requests DCN100 & Flu shot.  ~  January 17, 2010:  mult Ortho problems- Psoriatic arth & DJD knees w/ bone on bone- needs TKRs per [name];  s/p Synvisc (helped for awhile), & recent cortisone shots... recent twisting injury, taking Ultram + Tylenol, declines Vicodin Rx... she gets labs from [employer] regularly & [name] yearly... otherw stable medically- see prob list below.  ~  November 24, 2010:  31mo ROV & she had bilat TKRs by [name] 11/11> lots of rehab & 14mo out of work but after all it was worth it she says & much improved now w/ 43# wt loss as a frige benefit;  She still follows up w/ [name] for her Psoriatic Arthritis on ENBREL (off MTX for now) & she does labs every 3 months> asked to add FLP, TSH, etc at next lab draw w/ copy to me;  She notes her biggest post op prob was insomnia & no better w/ Ambien 10mg  & it took AMBIEN CR 12.5mg  to help her rest;  She would like Rx for IMITREX nasal spray for her HAs refilled from [place] today...  ~  December 14, 2011:  Yearly ROV & [name] indicates this has been a busy year w/ lots of travel for work;  Unfortunately she has re-gaine 30# during this time- not paying attn to  her diet or exercising;  She continues to have mult Ortho complaints> back pain, doing "therapy", eval by [name] w/ scoliosis; also eval for left wrist pain ?DeQuervain's tenosynovitis w/ shots from [name] & he is planning surg in Aug...    We have an office note from [facility] 4/13> psoriatic arthritis & LBP- s/p improvement from integrative therapies; tried TENS- helpful but insurance wouldn't pay; she checked labs, Rx Robaxin, continue exercises...    We reviewed prob list, meds, xrays and labs> see below>> LABS 4/13 from [name]> FLP- at goals on diet alone;  BMet- wnl w/ BS=84 A1c=5.4 & Creat=1.1;  TSH=1.98   Problem List:  ALLERGIC RHINITIS (ICD-477.9) - on XYZAL 5mg /d,  NASONEX Qhs,  ASTELIN Prn... +allergy testing in past- trees & grasses mostly...  BRONCHITIS, RECURRENT (ICD-491.9) - she's been doing well overall without recent infections etc... ~  CXR 11/11 showed normal heart size, clear lungs, healing fx left 6th rib posteriorly...  VENOUS INSUFFICIENCY (ICD-459.81) - she follows a low salt diet, elevates legs, and wears support hose Prn... she's had normal ArtDopplers and neg VenDopplers in the last 3yrs... ~  7/13:  She notes trophic changes & discoloration of the skin, no edema however...  HYPOTHYROIDISM, BORDERLINE (ICD-244.9) - followed by [name] on LEVOTHYROID 26mcg/d;  Pt states tha  she checks pt once per yr and does her thyroid labs... ~  Labs 4/13 by [name] showed TSH= 1.98  OBESITY (ICD-278.00) - she is 5\' 7"  tall and BMI at 35-38 range... ~  weight 10/10 = 236# ~  weight 8/11 = 255# (BMI= 37-38) ~  Weight 6/12 = 211# (BMI= 33) ~  Weight 7/13 = 240# and she notes not exercising due to work & back...  GERD (ICD-530.81) - she uses OTC Pepcid as needed...  IRRITABLE BOWEL SYNDROME (ICD-564.1) - last colonoscopy 3/06 by [name] showed divertics, hems, prob IBS... f/u planned 31yrs.  DEGENERATIVE JOINT DISEASE (ICD-715.90) - she has osteoarthritis and takes  TYLENOL as needed (stopped Tramadol due to "dizzy & sick"). ~  11/11: she had bilat TKRs done by [name]> off work x 58mo, lots of PT, & finally improved.  PSORIATIC ARTHROPATHY (ICD-696.0) - on ENBREL 50mg /wk, (off prev MTX since her knee surg 11/11)... followed by [name] locally and prev by [name] at [facility]... also sees [name] for [employer]...  MIGRAINES, HX OF (ICD-V13.8) - on IMITREX Prn, PHENERGAN Prn... she is [name] [facility] & used to take Atenolol 25mg /d for prevention but she notes much improved now (since off Premarin), est only 2-3 HAs per yr now & doesn't need the BBlocker she says...  PERIPHERAL NEUROPATHY (ICD-356.9) - she tried Neurontin but was INTOL "it made me stupid"...  ANXIETY (ICD-300.00) - not currently on meds.  Health Maintenance - GYN= [name]'s office... prev on Premarin, off now w/ hot flashes controlled after consult w/ [name] w/ CLONIDINE 0.1mg /d... Mammograms at [facility]... prev BMD w/ osteopenia per pt hx- on Calcium, Vits, Vit D OTC... rec to take ASA 81mg /d... FLP's in past w/ borderline LDL~ 110 range- on diet Rx alone... ~  Labs 4/13 by [name] showed TChol 168, TG 108, HDL 82, LDL 64   Past Surgical History  Procedure Date  . Cataract extraction, bilateral   . Nasal sinus surgery   . Abdominal hysterectomy   . Shoulder surgery   . Bilat tkrs 04/2010    by [name]    Outpatient Encounter Prescriptions as of 12/14/2011  Medication Sig Dispense Refill  . calcium carbonate (OS-CAL) 600 MG TABS Take 600 mg by mouth 2 (two) times daily with a meal.        . etanercept (ENBREL) 25 MG injection Inject into the skin once.       Marland Kitchen levothyroxine (SYNTHROID, LEVOTHROID) 50 MCG tablet Take 50 mcg by mouth daily.        . Melatonin 3 MG TABS Prn use      . methocarbamol (ROBAXIN) 500 MG tablet 1-2 tablets by mouth daily as needed       . Multiple Vitamin (MULTIVITAMIN) tablet Take 1 tablet by mouth daily.        . promethazine  (PHENERGAN) 25 MG tablet Take 25 mg by mouth every 6 (six) hours as needed.        . SUMAtriptan (IMITREX) 20 MG/ACT nasal spray Place 1 spray (20 mg total) into the nose every 2 (two) hours as needed.  1 Inhaler  5  . DISCONTD: azelastine (ASTELIN) 137 MCG/SPRAY nasal spray 1 spray by Nasal route 2 (two) times daily. Use in each nostril as directed       . DISCONTD: levocetirizine (XYZAL) 5 MG tablet Take 5 mg by mouth every evening.        Marland Kitchen DISCONTD: methotrexate (RHEUMATREX) 2.5 MG tablet Caution:Chemotherapy. Protect from light.  6 tablets by mouth a week HOLD       . DISCONTD: mometasone (NASONEX) 50 MCG/ACT nasal spray 2 sprays by Nasal route daily.        Marland Kitchen DISCONTD: traMADol (ULTRAM) 50 MG tablet 2 tablets by mouth two times a day as needed       . DISCONTD: zolpidem (AMBIEN CR) 12.5 MG CR tablet Take 1 tablet (12.5 mg total) by mouth at bedtime as needed for sleep.  30 tablet  5    Allergies  Allergen Reactions  . Codeine     REACTION: severe nausea by itself  . Penicillins     REACTION: hives all over    Review of Systems         See HPI - all other systems neg except as noted...  The patient complains of weight gain, dyspnea on exertion, and difficulty walking.  The patient denies anorexia, fever, weight loss, vision loss, decreased hearing, hoarseness, chest pain, syncope, peripheral edema, prolonged cough, headaches, hemoptysis, abdominal pain, melena, hematochezia, severe indigestion/heartburn, hematuria, incontinence, muscle weakness, suspicious skin lesions, transient blindness, depression, unusual weight change, abnormal bleeding, enlarged lymph nodes, and angioedema.     Objective:   Physical Exam     WD, Overweight, 61 y/o WF in NAD... GENERAL:  Alert & oriented; pleasant & cooperative... HEENT:  [place]/AT, EOM-wnl, PERRLA, EACs-clear, TMs-wnl, NOSE-clear, THROAT-clear & wnl. NECK:  Supple w/ fairROM; no JVD; normal carotid impulses w/o bruits; no thyromegaly or nodules  palpated; no lymphadenopathy. CHEST:  Clear to P & A; without wheezes/ rales/ or rhonchi. HEART:  Regular Rhythm; without murmurs/ rubs/ or gallops. ABDOMEN:  Obese, soft & nontender; normal bowel sounds; no organomegaly or masses detected. EXT:  severe arthritic changes in knees; no varicose veins/ +venous insuffic/ tr edema. NEURO:  CN's intact; motor testing normal; sensory testing normal; gait normal & balance OK. DERM:  few min psoriatic patches...   RADIOLOGY DATA:  Reviewed in the EPIC EMR & discussed w/ the patient...  LABORATORY DATA:  Reviewed in the EPIC EMR & discussed w/ the patient...   Assessment & Plan:   AR/ Bronchitis>  Stable on antihist, nasal steroid, etc; she denies recent resp exac...  Ven Insuffic>  She did beautifully after her knee surg, no DVT, swelling, etc...  HYPOTHYROID>  Followed by [name] on Synthroid50 & she does her labs...  OBESITY>  Fabulous job w/ wt down 47# to 211 in 2012, but unfortunately gained up to 240# in 2013...  GI>  GERD/ IBS>  follwed by [name], up to date, & stable...  DJD/ PSORIATIC Arthritis>  follwed by [name] & [name] on ENBREL Rx...  Migraine HA>  Hx migraines but stable & quiescent; she request refill IMITREX nasal sp...  Other medical problems as noted>>> pt will get labs at [facility] w/ copy to me...   Patient's Medications  New Prescriptions   No medications on file  Previous Medications   CALCIUM CARBONATE (OS-CAL) 600 MG TABS    Take 600 mg by mouth 2 (two) times daily with a meal.     ETANERCEPT (ENBREL) 25 MG INJECTION    Inject into the skin once.    LEVOTHYROXINE (SYNTHROID, LEVOTHROID) 50 MCG TABLET    Take 50 mcg by mouth daily.     MELATONIN 3 MG TABS    Prn use   METHOCARBAMOL (ROBAXIN) 500 MG TABLET    1-2 tablets by mouth daily as needed    MULTIPLE VITAMIN (MULTIVITAMIN) TABLET  Take 1 tablet by mouth daily.    Modified Medications   Modified Medication Previous Medication    PROMETHAZINE (PHENERGAN) 25 MG TABLET promethazine (PHENERGAN) 25 MG tablet      Take 1 tablet (25 mg total) by mouth every 6 (six) hours as needed for nausea.    Take 25 mg by mouth every 6 (six) hours as needed.     SUMATRIPTAN (IMITREX) 20 MG/ACT NASAL SPRAY SUMAtriptan (IMITREX) 20 MG/ACT nasal spray      Place 1 spray (20 mg total) into the nose every 2 (two) hours as needed.    Place 1 spray (20 mg total) into the nose every 2 (two) hours as needed.  Discontinued Medications   AZELASTINE (ASTELIN) 137 MCG/SPRAY NASAL SPRAY    1 spray by Nasal route 2 (two) times daily. Use in each nostril as directed    LEVOCETIRIZINE (XYZAL) 5 MG TABLET    Take 5 mg by mouth every evening.     METHOTREXATE (RHEUMATREX) 2.5 MG TABLET    Caution:Chemotherapy. Protect from light. 6 tablets by mouth a week HOLD    MOMETASONE (NASONEX) 50 MCG/ACT NASAL SPRAY    2 sprays by Nasal route daily.     TRAMADOL (ULTRAM) 50 MG TABLET    2 tablets by mouth two times a day as needed    ZOLPIDEM (AMBIEN CR) 12.5 MG CR TABLET    Take 1 tablet (12.5 mg total) by mouth at bedtime as needed for sleep.

## 2011-12-14 NOTE — Patient Instructions (Addendum)
Today we updated your med list in our EPIC system...    Continue your current medications the same...    We refilled the meds you requested...  We reviewed your recent lab work from [facility]...  We also wrote for a SHINGLES vaccine at your request.  Let's get on track w/ our diet & exercise program...  Call for any questions or if we can be of service in any way.Marland KitchenMarland Kitchen

## 2011-12-20 ENCOUNTER — Encounter: Payer: Self-pay | Admitting: Pulmonary Disease

## 2012-03-25 ENCOUNTER — Telehealth: Payer: Self-pay | Admitting: Pulmonary Disease

## 2012-03-25 NOTE — Telephone Encounter (Signed)
Called and spoke with pt and she stated that her psychiatrist is treating her adult add with adderall xl and requested that she have an EKG done prior to starting on this medication.   She stated that he gave her an rx for this to be done but pt was not sure where to go for this to be done. Spoke with TD and we have placed the pt on TP scheduled to come in for visit and ekg.  Nothing further is needed.

## 2012-04-04 ENCOUNTER — Ambulatory Visit (INDEPENDENT_AMBULATORY_CARE_PROVIDER_SITE_OTHER): Payer: BC Managed Care – PPO | Admitting: Adult Health

## 2012-04-04 ENCOUNTER — Encounter: Payer: Self-pay | Admitting: Adult Health

## 2012-04-04 VITALS — BP 116/82 | HR 67 | Temp 96.9°F | Ht 68.5 in | Wt 235.6 lb

## 2012-04-04 DIAGNOSIS — Z79899 Other long term (current) drug therapy: Secondary | ICD-10-CM

## 2012-04-04 DIAGNOSIS — F988 Other specified behavioral and emotional disorders with onset usually occurring in childhood and adolescence: Secondary | ICD-10-CM | POA: Insufficient documentation

## 2012-04-04 MED ORDER — LEVOCETIRIZINE DIHYDROCHLORIDE 5 MG PO TABS: 5.0000 mg | ORAL_TABLET | Freq: Every evening | ORAL | Status: DC

## 2012-04-04 NOTE — Patient Instructions (Addendum)
Continue on current regimen.  We will fax EKG to Psych office  follow up Dr. Kriste Basque  As planned and  As needed

## 2012-04-04 NOTE — Assessment & Plan Note (Signed)
Improved control w/ Adderral per Psych  EKG without any acute changes  VSS  No cardiac issues identified  Cont tx as directed by Psych

## 2012-04-04 NOTE — Progress Notes (Signed)
Subjective:    Patient ID: Sarah Horton, female    DOB: 12/15/1950, 61 y.o.   MRN: [id number]  HPI 61 y/o WF  ~  March 08, 2009 : she states that she has been doing well and has no new complaints or concerns... she is followed by Sarah Horton for Rheum- w/ psoriatic arthritis on Enbrel/ MTX... she reports incr LFT's prob due to Celebrex & she is off this NSAID... followed by Sarah Horton for Endocrine on Levoxyl 53mcg/d now "it was up to 3.7 & she started meds because of low temp & my hair falling out"... also followed by Sarah Horton for HA's on Aten, Imitrex, Phenergan (she states HA's reduced off Premarin)... she gets labs every 6 weeks from Sarah Horton & doesn't want additional lab work here... requests DCN100 & Flu shot.  ~  January 17, 2010:  mult Ortho problems- Psoriatic arth & DJD knees w/ bone on bone- needs TKRs per Sarah Horton;  s/p Synvisc (helped for awhile), & recent cortisone shots... recent twisting injury, taking Ultram + Tylenol, declines Vicodin Rx... she gets labs from [employer] regularly & Sarah Horton yearly... otherw stable medically- see prob list below.  ~  November 24, 2010:  17mo ROV & she had bilat TKRs by Sarah Horton 11/11> lots of rehab & 29mo out of work but after all it was worth it she says & much improved now w/ 43# wt loss as a frige benefit;  She still follows up w/ Sarah Horton for her Psoriatic Arthritis on ENBREL (off MTX for now) & she does labs every 3 months> asked to add FLP, TSH, etc at next lab draw w/ copy to me;  She notes her biggest post op prob was insomnia & no better w/ Ambien 10mg  & it took AMBIEN CR 12.5mg  to help her rest;  She would like Rx for IMITREX nasal spray for her HAs refilled from [place] today...  ~  December 14, 2011:  Yearly ROV & Sarah Horton indicates this has been a busy year w/ lots of travel for work;  Unfortunately she has re-gaine 30# during this time- not paying attn to her diet or exercising;  She continues to have mult Ortho complaints> back pain, doing  "therapy", eval by Sarah Horton w/ scoliosis; also eval for left wrist pain ?DeQuervain's tenosynovitis w/ shots from [name] & he is planning surg in Aug...    We have an office note from [name] 4/13> psoriatic arthritis & LBP- s/p improvement from integrative therapies; tried TENS- helpful but insurance wouldn't pay; she checked labs, Rx Robaxin, continue exercises...    We reviewed prob list, meds, xrays and labs> see below>> LABS 4/13 from Sarah Horton> FLP- at goals on diet alone;  BMet- wnl w/ BS=84 A1c=5.4 & Creat=1.1;  TSH=1.98  04/04/2012 Follow up  Patient presents for a work in visit for EKG Patient has been undergoing counseling for attention deficit disorder and has recently started on Adderall. Her psychiatrist wanted her to come in today for a routine EKG or cardiac monitoring while on Adderall EKG today shows a normal sinus rhythm without any acute changes and a prolonged QT interval Patient reports that she has been doing well since beginning Adderall seems to be having increase focus and improved. Job performance She denies any chest pain, palpitations, syncope, orthopnea, PND, or leg swelling   Problem List:  ALLERGIC RHINITIS (ICD-477.9) - on XYZAL 5mg /d,  NASONEX Qhs,  ASTELIN Prn... +allergy testing in past- trees & grasses mostly...  BRONCHITIS, RECURRENT (ICD-491.9) - she's been doing well overall  without recent infections etc... ~  CXR 11/11 showed normal heart size, clear lungs, healing fx left 6th rib posteriorly...  VENOUS INSUFFICIENCY (ICD-459.81) - she follows a low salt diet, elevates legs, and wears support hose Prn... she's had normal ArtDopplers and neg VenDopplers in the last 3yrs... ~  7/13:  She notes trophic changes & discoloration of the skin, no edema however...  HYPOTHYROIDISM, BORDERLINE (ICD-244.9) - followed by Sarah Horton on LEVOTHYROID 57mcg/d;  Pt states tha she checks pt once per yr and does her thyroid labs... ~  Labs 4/13 by Sarah Horton showed TSH=  1.98  OBESITY (ICD-278.00) - she is 5\' 7"  tall and BMI at 35-38 range... ~  weight 10/10 = 236# ~  weight 8/11 = 255# (BMI= 37-38) ~  Weight 6/12 = 211# (BMI= 33) ~  Weight 7/13 = 240# and she notes not exercising due to work & back...  GERD (ICD-530.81) - she uses OTC Pepcid as needed...  IRRITABLE BOWEL SYNDROME (ICD-564.1) - last colonoscopy 3/06 by Sarah Horton showed divertics, hems, prob IBS... f/u planned 46yrs.  DEGENERATIVE JOINT DISEASE (ICD-715.90) - she has osteoarthritis and takes TYLENOL as needed (stopped Tramadol due to "dizzy & sick"). ~  11/11: she had bilat TKRs done by Sarah Horton> off work x 23mo, lots of PT, & finally improved.  PSORIATIC ARTHROPATHY (ICD-696.0) - on ENBREL 50mg /wk, (off prev MTX since her knee surg 11/11)... followed by Sarah Horton locally and prev by Sarah Horton at [facility]... also sees Sarah Horton for [employer]...  MIGRAINES, HX OF (ICD-V13.8) - on IMITREX Prn, PHENERGAN Prn... she is followed by [facility] H[facility] & used to take Atenolol 25mg /d for prevention but she notes much improved now (since off Premarin), est only 2-3 HAs per yr now & doesn't need the BBlocker she says...  PERIPHERAL NEUROPATHY (ICD-356.9) - she tried Neurontin but was INTOL "it made me stupid"...  ANXIETY (ICD-300.00) - not currently on meds.  Health Maintenance - GYN= Sarah Horton's office... prev on Premarin, off now w/ hot flashes controlled after consult w/ Sarah Horton w/ CLONIDINE 0.1mg /d... Mammograms at [facility]... prev BMD w/ osteopenia per pt hx- on Calcium, Vits, Vit D OTC... rec to take ASA 81mg /d... FLP's in past w/ borderline LDL~ 110 range- on diet Rx alone... ~  Labs 4/13 by Sarah Horton showed TChol 168, TG 108, HDL 82, LDL 64   Past Surgical History  Procedure Date  . Cataract extraction, bilateral   . Nasal sinus surgery   . Abdominal hysterectomy   . Shoulder surgery   . Bilat tkrs 04/2010    by Sarah Horton    Outpatient Encounter Prescriptions as of 04/04/2012    Medication Sig Dispense Refill  . amphetamine-dextroamphetamine (ADDERALL XR) 30 MG 24 hr capsule Take 1 tablet by mouth Daily.      . calcium carbonate (OS-CAL) 600 MG TABS Take 600 mg by mouth 2 (two) times daily with a meal.        . etanercept (ENBREL) 25 MG injection Inject into the skin once.       Marland Kitchen levothyroxine (SYNTHROID, LEVOTHROID) 50 MCG tablet Take 50 mcg by mouth daily.        . methocarbamol (ROBAXIN) 500 MG tablet 1-2 tablets by mouth daily as needed       . Multiple Vitamin (MULTIVITAMIN) tablet Take 1 tablet by mouth daily.        Marland Kitchen omeprazole (PRILOSEC) 20 MG capsule Take 20 mg by mouth daily.      . promethazine (PHENERGAN) 25 MG tablet Take  1 tablet (25 mg total) by mouth every 6 (six) hours as needed for nausea.  30 tablet  5  . SUMAtriptan (IMITREX) 20 MG/ACT nasal spray Place 1 spray (20 mg total) into the nose every 2 (two) hours as needed.  1 Inhaler  5  . DISCONTD: Melatonin 3 MG TABS Prn use        Allergies  Allergen Reactions  . Nucynta (Tapentadol)     Hives  . Codeine     REACTION: severe nausea by itself  . Penicillins     REACTION: hives all over    Review of Systems          Constitutional:   No  weight loss, night sweats,  Fevers, chills, fatigue, or  lassitude.  HEENT:   No headaches,  Difficulty swallowing,  Tooth/dental problems, or  Sore throat,                No sneezing, itching, ear ache,  +nasal congestion, post nasal drip,   CV:  No chest pain,  Orthopnea, PND,  , anasarca, dizziness, palpitations, syncope.   GI  No heartburn, indigestion, abdominal pain, nausea, vomiting, diarrhea, change in bowel habits, loss of appetite, bloody stools.   Resp: No shortness of breath with exertion or at rest.  No excess mucus, no productive cough,  No non-productive cough,  No coughing up of blood.  No change in color of mucus.  No wheezing.  No chest wall deformity  Skin: no rash or lesions.  GU: no dysuria, change in color of urine, no urgency  or frequency.  No flank pain, no hematuria   MS:     No back pain.  Psych:  No change in mood or affect. No depression or anxiety.  No memory loss.       Objective:   Physical Exam     WD, Overweight, 61 y/o WF in NAD... GENERAL:  Alert & oriented; pleasant & cooperative... HEENT:  [place]/AT,   EACs-clear, TMs-wnl, NOSE-clear, THROAT-clear & wnl. NECK:  Supple w/ fairROM; no JVD; normal carotid impulses w/o bruits; no thyromegaly or nodules palpated; no lymphadenopathy. CHEST:  Clear to P & A; without wheezes/ rales/ or rhonchi. HEART:  Regular Rhythm; without murmurs/ rubs/ or gallops. ABDOMEN:  Obese, soft & nontender; normal bowel sounds; no organomegaly or masses detected. EXT:  severe arthritic changes in knees; no varicose veins/ +venous insuffic/ tr edema. NEURO:  gait normal & balance OK. ..    Assessment & Plan:

## 2012-04-13 ENCOUNTER — Encounter: Payer: Self-pay | Admitting: Adult Health

## 2012-12-14 ENCOUNTER — Ambulatory Visit (INDEPENDENT_AMBULATORY_CARE_PROVIDER_SITE_OTHER): Payer: BC Managed Care – PPO | Admitting: Pulmonary Disease

## 2012-12-14 ENCOUNTER — Encounter: Payer: Self-pay | Admitting: Pulmonary Disease

## 2012-12-14 VITALS — BP 124/72 | HR 95 | Temp 97.5°F | Ht 68.5 in | Wt 238.4 lb

## 2012-12-14 DIAGNOSIS — M1990 Unspecified osteoarthritis, unspecified site: Secondary | ICD-10-CM

## 2012-12-14 DIAGNOSIS — Z87898 Personal history of other specified conditions: Secondary | ICD-10-CM

## 2012-12-14 DIAGNOSIS — J309 Allergic rhinitis, unspecified: Secondary | ICD-10-CM

## 2012-12-14 DIAGNOSIS — E669 Obesity, unspecified: Secondary | ICD-10-CM

## 2012-12-14 DIAGNOSIS — K589 Irritable bowel syndrome without diarrhea: Secondary | ICD-10-CM

## 2012-12-14 DIAGNOSIS — F411 Generalized anxiety disorder: Secondary | ICD-10-CM

## 2012-12-14 DIAGNOSIS — L4050 Arthropathic psoriasis, unspecified: Secondary | ICD-10-CM

## 2012-12-14 DIAGNOSIS — I872 Venous insufficiency (chronic) (peripheral): Secondary | ICD-10-CM

## 2012-12-14 DIAGNOSIS — K219 Gastro-esophageal reflux disease without esophagitis: Secondary | ICD-10-CM

## 2012-12-14 DIAGNOSIS — E039 Hypothyroidism, unspecified: Secondary | ICD-10-CM

## 2012-12-14 MED ORDER — AZELASTINE HCL 0.1 % NA SOLN: 1.0000 | Freq: Every day | NASAL | Status: DC

## 2012-12-14 MED ORDER — SUMATRIPTAN 20 MG/ACT NA SOLN: 1.0000 | NASAL | Status: DC | PRN

## 2012-12-14 MED ORDER — MOMETASONE FUROATE 50 MCG/ACT NA SUSP: 2.0000 | Freq: Every day | NASAL | Status: DC | PRN

## 2012-12-14 NOTE — Progress Notes (Signed)
Subjective:    Patient ID: Sarah Horton, female    DOB: 1951/02/17, 62 y.o.   MRN: [id number]  HPI 62 y/o WF here for a follow up visit... she has multiple medical problems as noted below...    ~  March 08, 2009 : she states that she has been doing well and has no new complaints or concerns... she is followed by Sarah Horton for Rheum- w/ psoriatic arthritis on Enbrel/ MTX... she reports incr LFT's prob due to Celebrex & she is off this NSAID... followed by Sarah Horton for Endocrine on Levoxyl 57mcg/d now "it was up to 3.7 & she started meds because of low temp & my hair falling out"... also followed by Sarah Horton for HA's on Aten, Imitrex, Phenergan (she states HA's reduced off Premarin)... she gets labs every 6 weeks from Sarah Horton & doesn't want additional lab work here... requests DCN100 & Flu shot.  ~  January 17, 2010:  mult Ortho problems- Psoriatic arth & DJD knees w/ bone on bone- needs TKRs per Sarah Horton;  s/p Synvisc (helped for awhile), & recent cortisone shots... recent twisting injury, taking Ultram + Tylenol, declines Vicodin Rx... she gets labs from Sarah Horton regularly & Sarah Horton yearly... otherw stable medically- see prob list below.  ~  November 24, 2010:  70mo ROV & she had bilat TKRs by Sarah Horton 11/11> lots of rehab & 65mo out of work but after all it was worth it she says & much improved now w/ 43# wt loss as a frige benefit;  She still follows up w/ Sarah Horton for her Psoriatic Arthritis on ENBREL (off MTX for now) & she does labs every 3 months> asked to add FLP, TSH, etc at next lab draw w/ copy to me;  She notes her biggest post op prob was insomnia & no better w/ Ambien 10mg  & it took AMBIEN CR 12.5mg  to help her rest;  She would like Rx for IMITREX nasal spray for her HAs refilled from Sarah Horton today...  ~  December 14, 2011:  Yearly ROV & Sarah Horton indicates this has been a busy year w/ lots of travel for work;  Unfortunately she has re-gaine 30# during this time- not paying attn to  her diet or exercising;  She continues to have mult Ortho complaints> back pain, doing "therapy", eval by Sarah Horton w/ scoliosis; also eval for left wrist pain ?DeQuervain's tenosynovitis w/ shots from Sarah Horton & he is planning surg in Aug...    We have an office note from [facility] 4/13> psoriatic arthritis & LBP- s/p improvement from integrative therapies; tried TENS- helpful but insurance wouldn't pay; she checked labs, Rx Robaxin, continue exercises...    We reviewed prob list, meds, xrays and labs> see below>> LABS 4/13 from Sarah Horton> FLP- at goals on diet alone;  BMet- wnl w/ BS=84 A1c=5.4 & Creat=1.1;  TSH=1.98   ~  December 14, 2012:  Yearly ROV & Sarah Horton describes a busy yr, lots going on, just can't seem to lose weight;  We reviewed the following medical problems during today's office visit >>     AR, AB> on Nasonex, Astelin, Xyzal5 as needed; followed by Sarah Horton for allergy- prev on shots but she stopped on her own, may need retesting...    Ven Insuffic> on low sodium, elevation, support hose; not requiring diuretics & no signif edema...    Hypothy> followed by Sarah Horton on Synthroid50; we do not have notes or labs but pt assures me they are ok...    Obesity> wt= 238# & unable to  lose; we reviewed diet + exercise; asked to discuss this w/ her Endocrinologist...    GI- GERD, Divertics, IBS> on Prilosec20 & Phenergan 25mg  for nausea w/ migraines; last colon 2006 by Sarah Horton w/o polyps found...    DJD & Psoriatic arthritis> followed by Sarah Horton on Enbrel, & Robaxin500Bid prn; last seen 2/14 & note reviewed- hx psoriatic arthritis c/o pain in feet, LBP, tol Enbrel well- labs (done every 17mo) reported all nromal; they rec Integrative Therapies...    Hx migraines> on Imitrex nasal spray per Sarah Horton (likes the spray & wants tabs too); when last seen 2013 they tried shots in scalp for migraines- no benefit; states HAs diminished off Premarin...    Hx periph neuropathy> [place]  "it's like walking on rocks" & she is rec to try non-weight-bearing exercises...    Anxiety, ?ADD> she is on Adderall-XR30mg /d per Sarah Horton; they requested EKG here prior to Rx- this was done 10/13 w/ NSR, rate66, poor R prog V1-3, otherw wnl... We reviewed prob list, meds, xrays and labs> see below for updates >> she requests handicap sticker... LABS:  Last labs in Epic 1/12; she tells me that all labs done now by Sarah Horton at [facility] & [facility] (asked to get copies to Sarah Horton to scan into EPIC)...           Problem List:  ALLERGIC RHINITIS (ICD-477.9) - on XYZAL 5mg /d,  NASONEX Qhs,  ASTELIN Prn... +allergy testing in past- trees & grasses mostly...  BRONCHITIS, RECURRENT (ICD-491.9) - she's been doing well overall without recent infections etc... ~  CXR 11/11 showed normal heart size, clear lungs, healing fx left 6th rib posteriorly...  VENOUS INSUFFICIENCY (ICD-459.81) - she follows a low salt diet, elevates legs, and wears support hose Prn... she's had normal ArtDopplers and neg VenDopplers in the last 45yrs... ~  7/13 & 7/14:  She notes trophic changes & discoloration of the skin, no edema however...  HYPOTHYROIDISM, BORDERLINE (ICD-244.9) - followed by Sarah Horton on LEVOTHYROID 83mcg/d;  Pt states tha she checks pt once per yr and does her thyroid labs... ~  Labs 4/13 by Sarah Horton showed TSH= 1.98 ~  She continues to f/u w/ Endocrine, Sarah Horton w/ labs done in her office...  OBESITY (ICD-278.00) - she is 5\' 7"  tall and BMI at 35-38 range... ~  weight 10/10 = 236# ~  weight 8/11 = 255# (BMI= 37-38) ~  Weight 6/12 = 211# (BMI= 33) ~  Weight 7/13 = 240# and she notes not exercising due to work & back... ~  Weight 7/14 = 238#  GERD (ICD-530.81) - she uses OTC Pepcid as needed... IRRITABLE BOWEL SYNDROME (ICD-564.1) - last colonoscopy 3/06 by Sarah Horton showed divertics, hems, prob IBS... f/u planned 56yrs.  DEGENERATIVE JOINT DISEASE (ICD-715.90) - she has  osteoarthritis and takes TYLENOL as needed (stopped Tramadol due to "dizzy & sick"). ~  11/11: she had bilat TKRs done by Sarah Horton> off work x 17mo, lots of PT, & finally improved.  PSORIATIC ARTHROPATHY (ICD-696.0) - on ENBREL 50mg /wk, (off prev MTX since her knee surg 11/11)... followed by Sarah Horton locally and prev [name]... also sees [name] for [employer]...  MIGRAINES, HX OF (ICD-V13.8) - on IMITREX Prn, PHENERGAN Prn... she is followed by Sarah H[facility] & used to take Atenolol 25mg /d for prevention but she notes much improved now (since off Premarin), est only 2-3 HAs per yr now & doesn't need the BBlocker she says...  PERIPHERAL NEUROPATHY (ICD-356.9) -  she tried Neurontin but was INTOL "it made me stupid"...  ANXIETY (ICD-300.00) >> Adult ADD  ~  She's had eval at [facility]- they wrote for [facility] & she feels improved...  Health Maintenance - GYN= [name]'s office... prev on Premarin, off now w/ hot flashes controlled after consult w/ Sarah Horton w/ CLONIDINE 0.1mg /d... Mammograms at [facility]... prev BMD w/ osteopenia per pt hx- on Calcium, Vits, Vit D OTC... rec to take ASA 81mg /d... FLP's in past w/ borderline LDL~ 110 range- on diet Rx alone... ~  Labs 4/13 by Sarah Horton showed TChol 168, TG 108, HDL 82, LDL 64   Past Surgical History  Procedure Laterality Date  . Cataract extraction, bilateral    . Nasal sinus surgery    . Abdominal hysterectomy    . Shoulder surgery    . Bilat tkrs  04/2010    by Sarah Horton    Outpatient Encounter Prescriptions as of 12/14/2012  Medication Sig Dispense Refill  . amphetamine-dextroamphetamine (ADDERALL XR) 30 MG 24 hr capsule Take 1 tablet by mouth Daily.      . calcium carbonate (OS-CAL) 600 MG TABS Take 600 mg by mouth 2 (two) times daily with a meal.        . etanercept (ENBREL) 25 MG injection Inject into the skin once.       Marland Kitchen levocetirizine (XYZAL) 5 MG tablet Take 1 tablet (5 mg total) by mouth  every evening.  30 tablet  11  . levothyroxine (SYNTHROID, LEVOTHROID) 50 MCG tablet Take 50 mcg by mouth daily.        . methocarbamol (ROBAXIN) 500 MG tablet 1-2 tablets by mouth daily as needed       . Multiple Vitamin (MULTIVITAMIN) tablet Take 1 tablet by mouth daily.        Marland Kitchen omeprazole (PRILOSEC) 20 MG capsule Take 20 mg by mouth daily.      . promethazine (PHENERGAN) 25 MG tablet Take 1 tablet (25 mg total) by mouth every 6 (six) hours as needed for nausea.  30 tablet  5  . SUMAtriptan (IMITREX) 20 MG/ACT nasal spray Place 1 spray (20 mg total) into the nose every 2 (two) hours as needed.  1 Inhaler  5   No facility-administered encounter medications on file as of 12/14/2012.    Allergies  Allergen Reactions  . Nucynta (Tapentadol)     Hives  . Codeine     REACTION: severe nausea by itself  . Penicillins     REACTION: hives all over    Review of Systems         See HPI - all other systems neg except as noted...  The patient complains of weight gain, dyspnea on exertion, and difficulty walking.  The patient denies anorexia, fever, weight loss, vision loss, decreased hearing, hoarseness, chest pain, syncope, peripheral edema, prolonged cough, headaches, hemoptysis, abdominal pain, melena, hematochezia, severe indigestion/heartburn, hematuria, incontinence, muscle weakness, suspicious skin lesions, transient blindness, depression, unusual weight change, abnormal bleeding, enlarged lymph nodes, and angioedema.     Objective:   Physical Exam     WD, Overweight, 62 y/o WF in NAD... GENERAL:  Alert & oriented; pleasant & cooperative... HEENT:  [place]/AT, EOM-wnl, PERRLA, EACs-clear, TMs-wnl, NOSE-clear, THROAT-clear & wnl. NECK:  Supple w/ fairROM; no JVD; normal carotid impulses w/o bruits; no thyromegaly or nodules palpated; no lymphadenopathy. CHEST:  Clear to P & A; without wheezes/ rales/ or rhonchi. HEART:  Regular Rhythm; without murmurs/ rubs/ or gallops. ABDOMEN:  Obese, soft  &  nontender; normal bowel sounds; no organomegaly or masses detected. EXT:  severe arthritic changes in knees; no varicose veins/ +venous insuffic/ tr edema. NEURO:  CN's intact; motor testing normal; sensory testing normal; gait normal & balance OK. DERM:  few min psoriatic patches...   RADIOLOGY DATA:  Reviewed in the EPIC EMR & discussed w/ the patient...  LABORATORY DATA:  Reviewed in the EPIC EMR & discussed w/ the patient...   Assessment & Plan:    AR/ Bronchitis>  Stable on antihist, nasal steroid, etc; she denies recent resp exac...  Ven Insuffic>  She did beautifully after her knee surg, no DVT, swelling, etc; reminded to elev legs, wear support hose, etc...  HYPOTHYROID>  Followed by Sarah Horton on Synthroid50 & she does her labs...  OBESITY>  Fabulous job w/ wt down 47# to 211 in 2012, but unfortunately gained up to 240# in 2013...  GI>  GERD/ IBS>  follwed by Sarah Horton, up to date, & stable...  DJD/ PSORIATIC Arthritis>  follwed by Sarah Horton & Sarah Horton on ENBREL Rx...  Migraine HA>  Hx migraines but stable & quiescent; followed by Sarah Horton- she request refill IMITREX nasal sp...  Other medical problems as noted>>> pt will get labs at [facility] w/ copy to me...   Patient's Medications  New Prescriptions   No medications on file  Previous Medications   AMPHETAMINE-DEXTROAMPHETAMINE (ADDERALL XR) 30 MG 24 HR CAPSULE    Take 1 tablet by mouth Daily.   CALCIUM CARBONATE (OS-CAL) 600 MG TABS    Take 600 mg by mouth 2 (two) times daily with a meal.     ETANERCEPT (ENBREL) 50 MG/ML INJECTION    Inject 50 mg into the skin once a week.   LEVOCETIRIZINE (XYZAL) 5 MG TABLET    Take 1 tablet (5 mg total) by mouth every evening.   LEVOTHYROXINE (SYNTHROID, LEVOTHROID) 50 MCG TABLET    Take 50 mcg by mouth daily.     METHOCARBAMOL (ROBAXIN) 500 MG TABLET    1-2 tablets by mouth daily as needed    MULTIPLE VITAMIN (MULTIVITAMIN) TABLET    Take 1 tablet by mouth daily.      OMEPRAZOLE (PRILOSEC) 20 MG CAPSULE    Take 20 mg by mouth daily.   PROMETHAZINE (PHENERGAN) 25 MG TABLET    Take 1 tablet (25 mg total) by mouth every 6 (six) hours as needed for nausea.  Modified Medications   Modified Medication Previous Medication   AZELASTINE (ASTELIN) 137 MCG/SPRAY NASAL SPRAY azelastine (ASTELIN) 137 MCG/SPRAY nasal spray      Place 1 spray into the nose daily. Use in each nostril as directed    Place 1 spray into the nose daily. Use in each nostril as directed   MOMETASONE (NASONEX) 50 MCG/ACT NASAL SPRAY mometasone (NASONEX) 50 MCG/ACT nasal spray      Place 2 sprays into the nose daily as needed.    Place 2 sprays into the nose daily as needed.   SUMATRIPTAN (IMITREX) 20 MG/ACT NASAL SPRAY SUMAtriptan (IMITREX) 20 MG/ACT nasal spray      Place 1 spray (20 mg total) into the nose every 2 (two) hours as needed.    Place 1 spray (20 mg total) into the nose every 2 (two) hours as needed.  Discontinued Medications   ETANERCEPT (ENBREL) 25 MG INJECTION    Inject into the skin once.

## 2012-12-14 NOTE — Patient Instructions (Addendum)
Today we updated your med list in our EPIC system...    Continue your current medications the same...    We refilled the meds you requested...  Let's get on track w/ our diet & exercise program...    The goal is to lose 15-20 lbs...  Call for any questions...  Let's plan a routine follow up visit in 32yr, sooner if needed for problems.Marland KitchenMarland Kitchen

## 2012-12-30 ENCOUNTER — Other Ambulatory Visit: Payer: Self-pay | Admitting: Pulmonary Disease

## 2013-04-28 ENCOUNTER — Telehealth: Payer: Self-pay | Admitting: Pulmonary Disease

## 2013-04-28 NOTE — Telephone Encounter (Signed)
Per leigh pt needs to call eye doctor to be seen. I called and made pt aware. Nothing further needed

## 2013-05-25 ENCOUNTER — Other Ambulatory Visit: Payer: Self-pay | Admitting: Dermatology

## 2013-06-06 ENCOUNTER — Ambulatory Visit (INDEPENDENT_AMBULATORY_CARE_PROVIDER_SITE_OTHER): Payer: BC Managed Care – PPO | Admitting: Pulmonary Disease

## 2013-06-06 ENCOUNTER — Other Ambulatory Visit (INDEPENDENT_AMBULATORY_CARE_PROVIDER_SITE_OTHER): Payer: BC Managed Care – PPO

## 2013-06-06 ENCOUNTER — Encounter: Payer: Self-pay | Admitting: Pulmonary Disease

## 2013-06-06 ENCOUNTER — Encounter (INDEPENDENT_AMBULATORY_CARE_PROVIDER_SITE_OTHER): Payer: Self-pay

## 2013-06-06 VITALS — BP 142/88 | HR 92 | Temp 98.0°F | Ht 68.5 in | Wt 247.6 lb

## 2013-06-06 DIAGNOSIS — L4050 Arthropathic psoriasis, unspecified: Secondary | ICD-10-CM

## 2013-06-06 DIAGNOSIS — F411 Generalized anxiety disorder: Secondary | ICD-10-CM

## 2013-06-06 DIAGNOSIS — K589 Irritable bowel syndrome without diarrhea: Secondary | ICD-10-CM

## 2013-06-06 DIAGNOSIS — G609 Hereditary and idiopathic neuropathy, unspecified: Secondary | ICD-10-CM

## 2013-06-06 DIAGNOSIS — E039 Hypothyroidism, unspecified: Secondary | ICD-10-CM

## 2013-06-06 DIAGNOSIS — I872 Venous insufficiency (chronic) (peripheral): Secondary | ICD-10-CM

## 2013-06-06 DIAGNOSIS — L97309 Non-pressure chronic ulcer of unspecified ankle with unspecified severity: Secondary | ICD-10-CM

## 2013-06-06 DIAGNOSIS — I83003 Varicose veins of unspecified lower extremity with ulcer of ankle: Secondary | ICD-10-CM

## 2013-06-06 DIAGNOSIS — K219 Gastro-esophageal reflux disease without esophagitis: Secondary | ICD-10-CM

## 2013-06-06 DIAGNOSIS — E669 Obesity, unspecified: Secondary | ICD-10-CM

## 2013-06-06 LAB — TSH: TSH: 4.17 u[IU]/mL (ref 0.35–5.50)

## 2013-06-06 LAB — CBC WITH DIFFERENTIAL/PLATELET
Basophils Absolute: 0.1 10*3/uL (ref 0.0–0.1)
Eosinophils Relative: 2 % (ref 0.0–5.0)
Lymphocytes Relative: 36.4 % (ref 12.0–46.0)
Monocytes Relative: 11.6 % (ref 3.0–12.0)
Neutrophils Relative %: 49.3 % (ref 43.0–77.0)
Platelets: 197 10*3/uL (ref 150.0–400.0)
WBC: 8.2 10*3/uL (ref 4.5–10.5)

## 2013-06-06 LAB — BASIC METABOLIC PANEL
BUN: 18 mg/dL (ref 6–23)
CO2: 25 mEq/L (ref 19–32)
Chloride: 107 mEq/L (ref 96–112)
GFR: 58.08 mL/min — ABNORMAL LOW (ref >60.00)
Glucose, Bld: 97 mg/dL (ref 70–99)
Potassium: 4.7 mEq/L (ref 3.5–5.1)

## 2013-06-06 LAB — HEPATIC FUNCTION PANEL
ALT: 18 U/L (ref 0–35)
AST: 19 U/L (ref 0–37)
Albumin: 3.9 g/dL (ref 3.5–5.2)
Total Protein: 7.2 g/dL (ref 6.0–8.3)

## 2013-06-06 MED ORDER — SILVER SULFADIAZINE 1 % EX CREA: TOPICAL_CREAM | CUTANEOUS | Status: DC

## 2013-06-06 NOTE — Patient Instructions (Signed)
Today we updated your med list in our EPIC system...    Continue your current medications the same...  Today we did your follow up non-fasting blood work>     We will contact you w/ the results when available...   For your left ankle wound>>    Clean w/ mild soapy water once or twice daily (depending on amount of drainage)    Pat dry, then cover w/ Silvadene cream and Telfa dressing...    Watch the area carefully for any redness, increased drainage, etc...  Call for any questions.Marland KitchenMarland Kitchen

## 2013-06-06 NOTE — Progress Notes (Signed)
Subjective:    Patient ID: Sarah Horton, female    DOB: [birth year], 62 y.o.   MRN: [id number]  HPI 62 y/o WF here for a follow up visit... she has multiple medical problems as noted below...    ~  November 24, 2010:  55mo ROV & she had bilat TKRs by [name] 11/11> lots of rehab & 17mo out of work but after all it was worth it she says & much improved now w/ 43# wt loss as a frige benefit;  She still follows up w/ [name] for her Psoriatic Arthritis on ENBREL (off MTX for now) & she does labs every 3 months> asked to add FLP, TSH, etc at next lab draw w/ copy to me;  She notes her biggest post op prob was insomnia & no better w/ Ambien 10mg  & it took AMBIEN CR 12.5mg  to help her rest;  She would like Rx for IMITREX nasal spray for her HAs refilled from [place] today...  ~  December 14, 2011:  Yearly ROV & Sarah Horton indicates this has been a busy year w/ lots of travel for work;  Unfortunately she has re-gaine 30# during this time- not paying attn to her diet or exercising;  She continues to have mult Ortho complaints> back pain, doing "therapy", eval by [name] w/ scoliosis; also eval for left wrist pain ?DeQuervain's tenosynovitis w/ shots from [name] & he is planning surg in Aug...    We have an office note from [facility] 4/13> psoriatic arthritis & LBP- s/p improvement from integrative therapies; tried TENS- helpful but insurance wouldn't pay; she checked labs, Rx Robaxin, continue exercises...    We reviewed prob list, meds, xrays and labs> see below>> LABS 4/13 from [name]> FLP- at goals on diet alone;  BMet- wnl w/ BS=84 A1c=5.4 & Creat=1.1;  TSH=1.98   ~  December 14, 2012:  Yearly ROV & Sarah Horton describes a busy yr, lots going on, just can't seem to lose weight;  We reviewed the following medical problems during today's office visit >>     AR, AB> on Nasonex, Astelin, Xyzal5 as needed; followed by DrSharma for allergy- prev on shots but she stopped on her own, may need retesting...    Ven  Insuffic> on low sodium, elevation, support hose; not requiring diuretics & no signif edema...    Hypothy> followed by [name] on Synthroid50; we do not have notes or labs but pt assures me they are ok...    Obesity> wt= 238# & unable to lose; we reviewed diet + exercise; asked to discuss this w/ her Endocrinologist...    GI- GERD, Divertics, IBS> on Prilosec20 & Phenergan 25mg  for nausea w/ migraines; last colon 2006 by [name] w/o polyps found...    DJD & Psoriatic arthritis> followed by [name] on Enbrel, & Robaxin500Bid prn; last seen 2/14 & note reviewed- hx psoriatic arthritis c/o pain in feet, LBP, tol Enbrel well- labs (done every 17mo) reported all nromal; they rec [facility]...    Hx migraines> on Imitrex nasal spray per [name] (likes the spray & wants tabs too); when last seen 2013 they tried shots in scalp for migraines- no benefit; states HAs diminished off Premarin...    Hx periph neuropathy> she has a Podiatrist in [place] "it's like walking on rocks" & she is rec to try non-weight-bearing exercises...    Anxiety, ?ADD> she is on Adderall-XR30mg /d [facility]; they requested EKG here prior to Rx- this was done 10/13 w/ NSR, rate66, poor  R prog V1-3, otherw wnl... We reviewed prob list, meds, xrays and labs> see below for updates >> she requests handicap sticker... LABS:  Last labs in Epic 1/12; she tells me that all labs done now by [name] at [facility] & [name] (asked to get copies to [place] to scan into EPIC)...   ~  June 06, 2013:  24mo ROV & add-on appt requested for sore over left medial malleolus; some discomfort, some min drainage, not red/ inflammed, no f/c/s, notes it's slow to heal; she went to [facility] yest & they suggested f/u here... She has VV/ VI stasis changes, notes a scratch on left ankle area above medial malleolus w/ broken skin that started this whole thing, she wears TED hose daily;  Exam shows VV/VI, dependent  rubor, onychomycosis;  She has seen Ortho- DrHewitt in the past regarding her right foot & fx toe w/ 2nd toe overriding the 3rd... We decided to treat w/ gentle cleansing 1-2x per day, cover w/ silvadene cream & no-stick pad, plus her support hose... She is to call if not improving for wound care clinic referral...    She sees DrVanWinkle for her allergies> cough, reflux related and Rx helps; Qnasl is helping her sinuses she says...     She has DJD & psoriatic arthritis followed regularly by [name] on Enbrel w/ labs done via her office every 76mo; also sees practitioners at [facility].Marland KitchenMarland Kitchen    ?peripheral neuropathy prev evaluated in [place] and we don't have records We reviewed prob list, meds, xrays and labs> see below for updates >> she had the 2014 Flu vaccine 10/14...  LABS 12/14:  Chems- wnl;  CBC- wnl w/ WBC=8.2;  TSH=4.17         Problem List:  ALLERGIC RHINITIS (ICD-477.9) - on XYZAL 5mg /d,  NASONEX Qhs,  ASTELIN Prn... +allergy testing in past- trees & grasses mostly... ~  She is followed by [facility]VanWinkle at the [facility] clinic...  BRONCHITIS, RECURRENT (ICD-491.9) - she's been doing well overall without recent infections etc... ~  CXR 11/11 showed normal heart size, clear lungs, healing fx left 6th rib posteriorly...  VENOUS INSUFFICIENCY (ICD-459.81) - she follows a low salt diet, elevates legs, and wears support hose Prn... she's had normal ArtDopplers and neg VenDopplers in the last 16yrs... ~  7/13 & 7/14:  She notes trophic changes & discoloration of the skin, no edema however... ~  12/14:  She presented w/ a sore over the left medial malleolus slow to heal after a scratch; treated w/ cleaning bid, cover w/ Silvadene cream, no-stick pad and support hose...  HYPOTHYROIDISM, BORDERLINE (ICD-244.9) - followed by [name] on LEVOTHYROID 63mcg/d;  Pt states tha she checks pt once per yr and does her thyroid labs... ~  Labs 4/13 by [name] showed TSH= 1.98 ~  She continues  to f/u w/ Endocrine, [name] w/ labs done in her office...  OBESITY (ICD-278.00) - she is 5\' 7"  tall and BMI at 35-38 range... ~  weight 10/10 = 236# ~  weight 8/11 = 255# (BMI= 37-38) ~  Weight 6/12 = 211# (BMI= 33) ~  Weight 7/13 = 240# and she notes not exercising due to work & back... ~  Weight 7/14 = 238# ~  Weight 12/14 = 248#  GERD (ICD-530.81) - she uses OTC Pepcid as needed... IRRITABLE BOWEL SYNDROME (ICD-564.1) - last colonoscopy 3/06 by [name] showed divertics, hems, prob IBS... f/u planned 83yrs.  DEGENERATIVE JOINT DISEASE (ICD-715.90) - she has osteoarthritis and takes TYLENOL  as needed (stopped Tramadol due to "dizzy & sick"). ~  11/11: she had bilat TKRs done by [name]> off work x 27mo, lots of PT, & finally improved.  PSORIATIC ARTHROPATHY (ICD-696.0) - on ENBREL 50mg /wk, (off prev MTX since her knee surg 11/11)... followed by [name] locally and prev by [name] at [facility]... also sees [name] for [employer]...  MIGRAINES, HX OF (ICD-V13.8) - on IMITREX Prn, PHENERGAN Prn... she is [name] [facility] & used to take Atenolol 25mg /d for prevention but she notes much improved now (since off Premarin), est only 2-3 HAs per yr now & doesn't need the BBlocker she says...  PERIPHERAL NEUROPATHY (ICD-356.9) - she tried Neurontin but was INTOL "it made me stupid"...  ANXIETY (ICD-300.00) >> Adult ADD  ~  She's had eval at [facility]- they wrote for [facility] & she feels improved...  Health Maintenance - GYN= [name]'s office... prev on Premarin, off now w/ hot flashes controlled after consult w/ [name] w/ CLONIDINE 0.1mg /d... Mammograms at [facility]... prev BMD w/ osteopenia per pt hx- on Calcium, Vits, Vit D OTC... rec to take ASA 81mg /d... FLP's in past w/ borderline LDL~ 110 range- on diet Rx alone... ~  Labs 4/13 by [name] showed TChol 168, TG 108, HDL 82, LDL 64   Past Surgical History  Procedure Laterality Date  . Cataract  extraction, bilateral    . Nasal sinus surgery    . Abdominal hysterectomy    . Shoulder surgery    . Bilat tkrs  04/2010    by [name]    Outpatient Encounter Prescriptions as of 06/06/2013  Medication Sig  . amphetamine-dextroamphetamine (ADDERALL XR) 30 MG 24 hr capsule Take 1 tablet by mouth Daily.  . Beclomethasone Dipropionate (QNASL) 80 MCG/ACT AERS Place 1 spray into the nose daily.  . calcium carbonate (OS-CAL) 600 MG TABS Take 600 mg by mouth 2 (two) times daily with a meal.    . etanercept (ENBREL) 50 MG/ML injection Inject 50 mg into the skin once a week.  . levothyroxine (SYNTHROID, LEVOTHROID) 50 MCG tablet Take 50 mcg by mouth daily.    . methocarbamol (ROBAXIN) 500 MG tablet 1-2 tablets by mouth daily as needed   . Multiple Vitamin (MULTIVITAMIN) tablet Take 1 tablet by mouth daily.    Marland Kitchen omeprazole (PRILOSEC) 20 MG capsule Take 20 mg by mouth daily.  . promethazine (PHENERGAN) 25 MG tablet TAKE 1 TABLET (25 MG TOTAL) BY MOUTH EVERY 6 (SIX) HOURS AS NEEDED FOR NAUSEA.  . SUMAtriptan (IMITREX) 20 MG/ACT nasal spray Place 1 spray (20 mg total) into the nose every 2 (two) hours as needed.  . [DISCONTINUED] azelastine (ASTELIN) 137 MCG/SPRAY nasal spray Place 1 spray into the nose daily. Use in each nostril as directed  . [DISCONTINUED] levocetirizine (XYZAL) 5 MG tablet Take 1 tablet (5 mg total) by mouth every evening.  . [DISCONTINUED] mometasone (NASONEX) 50 MCG/ACT nasal spray Place 2 sprays into the nose daily as needed.    Allergies  Allergen Reactions  . Nucynta [Tapentadol]     Hives  . Codeine     REACTION: severe nausea by itself  . Penicillins     REACTION: hives all over    Review of Systems         See HPI - all other systems neg except as noted...  The patient complains of weight gain, dyspnea on exertion, and difficulty walking.  The patient denies anorexia, fever, weight loss, vision loss, decreased hearing, hoarseness,  chest pain, syncope,  peripheral edema, prolonged cough, headaches, hemoptysis, abdominal pain, melena, hematochezia, severe indigestion/heartburn, hematuria, incontinence, muscle weakness, suspicious skin lesions, transient blindness, depression, unusual weight change, abnormal bleeding, enlarged lymph nodes, and angioedema.     Objective:   Physical Exam     WD, Overweight, 62 y/o WF in NAD... GENERAL:  Alert & oriented; pleasant & cooperative... HEENT:  [place]/AT, EOM-wnl, PERRLA, EACs-clear, TMs-wnl, NOSE-clear, THROAT-clear & wnl. NECK:  Supple w/ fairROM; no JVD; normal carotid impulses w/o bruits; no thyromegaly or nodules palpated; no lymphadenopathy. CHEST:  Clear to P & A; without wheezes/ rales/ or rhonchi. HEART:  Regular Rhythm; without murmurs/ rubs/ or gallops. ABDOMEN:  Obese, soft & nontender; normal bowel sounds; no organomegaly or masses detected. EXT:  severe arthritic changes in knees; min varicose veins/ +venous insuffic/ tr edema/ & open area above left medial malleolus... NEURO:  CN's intact; motor testing normal; sensory testing normal; gait normal & balance OK. DERM:  few min psoriatic patches, dependent rubor in legs, overriding right 2nd toe, onychomycosis...   RADIOLOGY DATA:  Reviewed in the EPIC EMR & discussed w/ the patient...  LABORATORY DATA:  Reviewed in the EPIC EMR & discussed w/ the patient...   Assessment & Plan:    Venous stasis ulcer left medial malleolus area>> clean daily w/ mild soapy water, pat dry, cover w/ Silvadene cream & telfa pad plus her support hose; watch carefully for incr drainage 7 refer to wound clinic...   AR/ Bronchitis>  Stable on antihist, nasal steroid, etc; she denies recent resp exac...  Ven Insuffic>  She did beautifully after her knee surg, no DVT, swelling, etc; reminded to elev legs, wear support hose, etc...  HYPOTHYROID>  Followed by [name] on Synthroid50 & she does her labs...  OBESITY>  Fabulous job w/ wt down 47# to 211 in 2012, but  unfortunately gained up to 240+# in 2013 & beyond...  GI>  GERD/ IBS>  follwed by [name], up to date, & stable...  DJD/ PSORIATIC Arthritis>  follwed by [name] & [name] on ENBREL Rx...  Migraine HA>  Hx migraines but stable & quiescent; followed by [name]- she request refill IMITREX nasal sp...  Other medical problems as noted>>> pt will get labs at [facility] w/ copy to me...   Patient's Medications  New Prescriptions   SILVER SULFADIAZINE (SILVADENE) 1 % CREAM    Use as directed  Previous Medications   AMPHETAMINE-DEXTROAMPHETAMINE (ADDERALL XR) 30 MG 24 HR CAPSULE    Take 1 tablet by mouth Daily.   BECLOMETHASONE DIPROPIONATE (QNASL) 80 MCG/ACT AERS    Place 1 spray into the nose daily.   CALCIUM CARBONATE (OS-CAL) 600 MG TABS    Take 600 mg by mouth 2 (two) times daily with a meal.     ETANERCEPT (ENBREL) 50 MG/ML INJECTION    Inject 50 mg into the skin once a week.   LEVOTHYROXINE (SYNTHROID, LEVOTHROID) 50 MCG TABLET    Take 50 mcg by mouth daily.     METHOCARBAMOL (ROBAXIN) 500 MG TABLET    1-2 tablets by mouth daily as needed    MULTIPLE VITAMIN (MULTIVITAMIN) TABLET    Take 1 tablet by mouth daily.     OMEPRAZOLE (PRILOSEC) 20 MG CAPSULE    Take 20 mg by mouth daily.   PROMETHAZINE (PHENERGAN) 25 MG TABLET    TAKE 1 TABLET (25 MG TOTAL) BY MOUTH EVERY 6 (SIX) HOURS AS NEEDED FOR NAUSEA.   SUMATRIPTAN (IMITREX) 20 MG/ACT NASAL SPRAY  Place 1 spray (20 mg total) into the nose every 2 (two) hours as needed.  Modified Medications   No medications on file  Discontinued Medications   AZELASTINE (ASTELIN) 137 MCG/SPRAY NASAL SPRAY    Place 1 spray into the nose daily. Use in each nostril as directed   LEVOCETIRIZINE (XYZAL) 5 MG TABLET    Take 1 tablet (5 mg total) by mouth every evening.   MOMETASONE (NASONEX) 50 MCG/ACT NASAL SPRAY    Place 2 sprays into the nose daily as needed.

## 2013-06-09 ENCOUNTER — Encounter: Payer: Self-pay | Admitting: Pulmonary Disease

## 2013-06-09 NOTE — Telephone Encounter (Signed)
[facility] as [facility]. Nothing further needed.

## 2013-06-13 ENCOUNTER — Encounter: Payer: Self-pay | Admitting: Pulmonary Disease

## 2013-10-27 ENCOUNTER — Telehealth: Payer: Self-pay | Admitting: Pulmonary Disease

## 2013-10-27 DIAGNOSIS — L989 Disorder of the skin and subcutaneous tissue, unspecified: Secondary | ICD-10-CM

## 2013-10-27 NOTE — Telephone Encounter (Signed)
Per SN ok for referral to wound clinic  Referral placed  Pt aware  Nothing further needed

## 2013-10-27 NOTE — Telephone Encounter (Signed)
Spoke with the pt  She is requesting referral to wound clinic  She states that she was seen by SN in Dec 2014 for sore on left ankle, and it never completely resolved  She states that Dr. Talmage Nap rec wound clinic referral  Please advise, thanks!

## 2013-11-29 ENCOUNTER — Encounter (HOSPITAL_BASED_OUTPATIENT_CLINIC_OR_DEPARTMENT_OTHER): Payer: BC Managed Care – PPO | Attending: General Surgery

## 2013-11-29 DIAGNOSIS — F411 Generalized anxiety disorder: Secondary | ICD-10-CM | POA: Insufficient documentation

## 2013-11-29 DIAGNOSIS — E669 Obesity, unspecified: Secondary | ICD-10-CM | POA: Insufficient documentation

## 2013-11-29 DIAGNOSIS — G609 Hereditary and idiopathic neuropathy, unspecified: Secondary | ICD-10-CM | POA: Insufficient documentation

## 2013-11-29 DIAGNOSIS — G43909 Migraine, unspecified, not intractable, without status migrainosus: Secondary | ICD-10-CM | POA: Insufficient documentation

## 2013-11-29 DIAGNOSIS — I872 Venous insufficiency (chronic) (peripheral): Secondary | ICD-10-CM | POA: Insufficient documentation

## 2013-11-29 DIAGNOSIS — E039 Hypothyroidism, unspecified: Secondary | ICD-10-CM | POA: Insufficient documentation

## 2013-11-29 DIAGNOSIS — L97309 Non-pressure chronic ulcer of unspecified ankle with unspecified severity: Secondary | ICD-10-CM | POA: Insufficient documentation

## 2013-11-29 DIAGNOSIS — Z79899 Other long term (current) drug therapy: Secondary | ICD-10-CM | POA: Insufficient documentation

## 2013-11-29 DIAGNOSIS — L4050 Arthropathic psoriasis, unspecified: Secondary | ICD-10-CM | POA: Insufficient documentation

## 2013-11-29 NOTE — Progress Notes (Signed)
[facility]  NAME:  Sarah Horton, Sarah Horton         ACCOUNT NO.:  [id number]  MEDICAL RECORD NO.:  [id number]      DATE OF BIRTH:  23-Dec-1950  PHYSICIAN:  Ardath SaxPeter Parker, M.D.      VISIT DATE:  11/29/2013                                  OFFICE VISIT   This is a 63 year old lady who was sent here by her family doctor with a venous ulcer on her left ankle.  She says it has been present for several weeks.  She has multiple medical problems including obesity, hypothyroidism, venous insufficiency rhinitis, bronchitis, psoriatic arthritis, migraine headaches, peripheral neuropathy, and anxiety.  Her medicines are Adderall, calcium carbonate, Enbrel, Synthroid, Levothroid, Robaxin, vitamins, Prilosec, Nasonex, Phenergan, Imitrex, and Astelin spray.  When she came here, she said she had an ulcer on the lateral aspect of her left ankle, which since the appointment was made has healed.  She was examined here and found to have a blood pressure of 147/89, pulse 90, temperature 98.  She weighs 245 pounds and height is 5 foot 9 inches.  I just advised her to wear her compression stockings, which she will purchase and where.  The ulcer is obviously healed.  She points to an area where it was on the lateral aspect of her ankle, but it is totally epithelialized at this point.  I told her to return p.r.n.     Ardath SaxPeter Parker, M.D.     PP/MEDQ  D:  11/29/2013  T:  11/29/2013  Job:  [id number]

## 2013-12-13 ENCOUNTER — Ambulatory Visit (INDEPENDENT_AMBULATORY_CARE_PROVIDER_SITE_OTHER)
Admission: RE | Admit: 2013-12-13 | Discharge: 2013-12-13 | Disposition: A | Discharge status: Home / Self Care | Payer: BC Managed Care – PPO | Source: Ambulatory Visit | Attending: Pulmonary Disease | Admitting: Pulmonary Disease

## 2013-12-13 ENCOUNTER — Ambulatory Visit (INDEPENDENT_AMBULATORY_CARE_PROVIDER_SITE_OTHER): Payer: BC Managed Care – PPO | Admitting: Pulmonary Disease

## 2013-12-13 ENCOUNTER — Encounter: Payer: Self-pay | Admitting: Pulmonary Disease

## 2013-12-13 VITALS — BP 136/94 | HR 90 | Temp 97.8°F | Ht 68.5 in | Wt 250.6 lb

## 2013-12-13 DIAGNOSIS — L4050 Arthropathic psoriasis, unspecified: Secondary | ICD-10-CM

## 2013-12-13 DIAGNOSIS — E039 Hypothyroidism, unspecified: Secondary | ICD-10-CM

## 2013-12-13 DIAGNOSIS — K2100 Gastro-esophageal reflux disease with esophagitis, without bleeding: Secondary | ICD-10-CM

## 2013-12-13 DIAGNOSIS — F988 Other specified behavioral and emotional disorders with onset usually occurring in childhood and adolescence: Secondary | ICD-10-CM

## 2013-12-13 DIAGNOSIS — F411 Generalized anxiety disorder: Secondary | ICD-10-CM

## 2013-12-13 DIAGNOSIS — J309 Allergic rhinitis, unspecified: Secondary | ICD-10-CM

## 2013-12-13 DIAGNOSIS — Z96659 Presence of unspecified artificial knee joint: Secondary | ICD-10-CM

## 2013-12-13 DIAGNOSIS — I872 Venous insufficiency (chronic) (peripheral): Secondary | ICD-10-CM

## 2013-12-13 DIAGNOSIS — Z96653 Presence of artificial knee joint, bilateral: Secondary | ICD-10-CM | POA: Insufficient documentation

## 2013-12-13 DIAGNOSIS — K589 Irritable bowel syndrome without diarrhea: Secondary | ICD-10-CM

## 2013-12-13 DIAGNOSIS — M1990 Unspecified osteoarthritis, unspecified site: Secondary | ICD-10-CM

## 2013-12-13 DIAGNOSIS — G609 Hereditary and idiopathic neuropathy, unspecified: Secondary | ICD-10-CM

## 2013-12-13 DIAGNOSIS — E669 Obesity, unspecified: Secondary | ICD-10-CM

## 2013-12-13 NOTE — Patient Instructions (Signed)
Today we updated your med list in our EPIC system...    Continue your current medications the same...  Today we did a follow up CXR...    We will contact you w/ the results when available...   Continue your anti-reflux regimen>>    Elev head of bed on 6" blocks...    Take the Prilosec about 30 min before the eve meal...    Do not eat or drink much after dinner in the eve...  Ok to use the Delsym, RobitussinDM, MucinexDM as needed...  Remember to have [name] follow up on your Fasting Lipid Profile as well...  Call for any questions or if we can be of service in any way.Marland Kitchen..Marland Kitchen

## 2013-12-15 ENCOUNTER — Encounter: Payer: Self-pay | Admitting: Pulmonary Disease

## 2013-12-15 NOTE — Progress Notes (Signed)
Subjective:    Patient ID: Sarah Horton, female    DOB: 05/20/1951, 63 y.o.   MRN: [id number]  HPI 63 y/o WF here for a follow up visit... she has multiple medical problems as noted below...   SEE PREV EPIC NOTES FOR OLDER DATA >>   ~  December 14, 2011:  Yearly ROV & Sarah Horton indicates this has been a busy year w/ lots of travel for work;  Unfortunately she has re-gaine 30# during this time- not paying attn to her diet or exercising;  She continues to have mult Ortho complaints> back pain, doing "therapy", eval by [name] w/ scoliosis; also eval for left wrist pain ?DeQuervain's tenosynovitis w/ shots from [name] & he is planning surg in Aug...    We have an office note from [facility] 4/13> psoriatic arthritis & LBP- s/p improvement from integrative therapies; tried TENS- helpful but insurance wouldn't pay; she checked labs, Rx Robaxin, continue exercises...    We reviewed prob list, meds, xrays and labs> see below>> LABS 4/13 from [name]> FLP- at goals on diet alone;  BMet- wnl w/ BS=84 A1c=5.4 & Creat=1.1;  TSH=1.98   ~  December 14, 2012:  Yearly ROV & Sarah Horton describes a busy yr, lots going on, just can't seem to lose weight;  We reviewed the following medical problems during today's office visit >>     AR, AB> on Nasonex, Astelin, Xyzal5 as needed; followed by DrSharma for allergy- prev on shots but she stopped on her own, may need retesting...    Ven Insuffic> on low sodium, elevation, support hose; not requiring diuretics & no signif edema...    Hypothy> followed by [name] on Synthroid50; we do not have notes or labs but pt assures me they are ok...    Obesity> wt= 238# & unable to lose; we reviewed diet + exercise; asked to discuss this w/ her Endocrinologist...    GI- GERD, Divertics, IBS> on Prilosec20 & Phenergan 25mg  for nausea w/ migraines; last colon 2006 by [name] w/o polyps found...    DJD & Psoriatic arthritis> followed by [name] on Enbrel, & Robaxin500Bid prn; last  seen 2/14 & note reviewed- hx psoriatic arthritis c/o pain in feet, LBP, tol Enbrel well- labs (done every 25mo) reported all nromal; they rec [facility]...    Hx migraines> on Imitrex nasal spray per [name] (likes the spray & wants tabs too); when last seen 2013 they tried shots in scalp for migraines- no benefit; states HAs diminished off Premarin...    Hx periph neuropathy> she has a [place]iatrist in [place] "it's like walking on rocks" & she is rec to try non-weight-bearing exercises...    Anxiety, ?ADD> she is on Adderall-XR30mg /d [facility]; they requested EKG here prior to Rx- this was done 10/13 w/ NSR, rate66, poor R prog V1-3, otherw wnl... We reviewed prob list, meds, xrays and labs> see below for updates >> she requests handicap sticker...  EKG 10/13 by TP showed NSR, rate66, poor R progression  LABS:  Last labs in Epic 1/12; she tells me that all labs done now by [name] at [facility] & [name] (asked to get copies to [place] to scan into EPIC)...   ~  June 06, 2013:  22mo ROV & add-on appt requested for sore over left medial malleolus; some discomfort, some min drainage, not red/ inflammed, no f/c/s, notes it's slow to heal; she [facility] & they suggested f/u here... She has VV/ VI stasis  changes, notes a scratch on left ankle area above medial malleolus w/ broken skin that started this whole thing, she wears TED hose daily;  Exam shows VV/VI, dependent rubor, onychomycosis;  She has seen Ortho- DrHewitt in the past regarding her right foot & fx toe w/ 2nd toe overriding the 3rd... We decided to treat w/ gentle cleansing 1-2x per day, cover w/ silvadene cream & no-stick pad, plus her support hose... She is to call if not improving for wound care clinic referral...    She sees DrVanWinkle for her allergies> cough, reflux related and Rx helps; Qnasl is helping her sinuses she says...     She has DJD & psoriatic arthritis followed  regularly by [name] on Enbrel w/ labs done via her office every 19mo; also sees practitioners at [facility].Marland KitchenMarland Kitchen    ?peripheral neuropathy prev [place] and we don't have records We reviewed prob list, meds, xrays and labs> see below for updates >> she had the 2014 Flu vaccine 10/14...   LABS 12/14:  Chems- wnl;  CBC- wnl w/ WBC=8.2;  TSH=4.17  ~  December 13, 2013:  40mo ROV & Sarah Horton returns indicating that she is under considerable stress w/ husb illness (DM, ITP, FLD=>cirrhosis, DDD in neck);  She continues to get her regular medical care from her specialists that she sees w/ some regularity>>    DrWolicki (ENT- seen 4/15, note reviewed) & VanWinkle (Allergy) for AR, sinusitis; she had ENT eval & CT Sinus 4/15 showing post surg changes, no active sinus dis/ mucosal thickening/ or obstruction; he suspected noct reflux- Rx Prilosec, antireflux regimen, Dymista;  She notes that she can feel her sinuses draining & doubts this is reflux since she often hold her head down over the bed which stops the trickle & helps her cough;  Exam is neg, lungs are clear;  CXR today shows norm heart size, clear lungs, NAD (remote healed left 6th rib fx)... We reviewed effective antireflux measures...     [name] (Endocrine- we do not have recent notes from her) for Hypothyroid, Obesity (wt up 4# to 251#, BMI=37-8), Lipid checks; pt indicates that Labs 4/15 showed TSH= 2.38 and A1c= 5.1; she will get FLP done, we reviewed diet recs...    DrDeveschwar (Rheum- we do not have recent notes from her) follows her Psoriatic arthritis & DJD on Enbrel & Robaxin; she's had bilat TKRs from [name]; pt brought labs from 5/15> Chems- wnl;  CBC- ok w/ Hg=12.3 (MCV=81);  Quantiferon Gold= NEG...     She also has VenInsuffic & hx stasis ulcer on left medial malleolus area treated w/ cleansing, Silvadene cream, dressings, and eventually resolved; more recent right foot big toe fx, hx hammer toes w/ prev surg & followed by  Podiatry...  We reviewed prob list, meds, xrays and labs> see below for updates >>   Labs from [facility] 5/15 reviewed...  CXR 7/15 showed norm heart size, clear lungs, NAD (remote healed left 6th rib fx)..          Problem List:  ALLERGIC RHINITIS (ICD-477.9) - on XYZAL 5mg /d,  NASONEX Qhs,  ASTELIN Prn... +allergy testing in past- trees & grasses mostly... ~  She is followed by DrVanWinkle at the [facility] allergy clinic... ~  She is followed by DrWolicki for ENT w/ CT Sinuses 4/15 that was NEG...  BRONCHITIS, RECURRENT (ICD-491.9) - she's been doing well overall without recent infections etc... ~  CXR 11/11 showed normal heart size, clear lungs, healing fx left 6th rib  posteriorly... ~  CXR 7/15 showed norm heart size, clear lungs, NAD (remote healed left 6th rib fx)  VENOUS INSUFFICIENCY (ICD-459.81) - she follows a low salt diet, elevates legs, and wears support hose Prn... she's had normal ArtDopplers and neg VenDopplers in the last 3yrs... ~  7/13 & 7/14:  She notes trophic changes & discoloration of the skin, no edema however... ~  12/14:  She presented w/ a sore over the left medial malleolus slow to heal after a scratch; treated w/ cleaning bid, cover w/ Silvadene cream, no-stick pad and support hose... ~  5-7/15:  She had right great toe fx, seen by Podiatry & f/u wound care clinic...  HYPOTHYROIDISM, BORDERLINE (ICD-244.9) - followed by [name] on LEVOTHYROID 45mcg/d;  Pt states tha she checks pt once per yr and does her thyroid labs... ~  Labs 4/13 by [name] showed TSH= 1.98 ~  She continues to f/u w/ Endocrine, [name] w/ labs done in her office...  OBESITY (ICD-278.00) - she is 5\' 7"  tall and BMI at 35-38 range... ~  weight 10/10 = 236# ~  weight 8/11 = 255# (BMI= 37-38) ~  Weight 6/12 = 211# (BMI= 33) ~  Weight 7/13 = 240# and she notes not exercising due to work & back... ~  Weight 7/14 = 238# ~  Weight 12/14 = 248# ~  Weight 7/15 = 251# and we reviewed diet,  exercise, wt reduction strategies...  GERD (ICD-530.81) - she uses OTC Pepcid as needed... IRRITABLE BOWEL SYNDROME (ICD-564.1) - last colonoscopy 3/06 by [name] showed divertics, hems, prob IBS... f/u planned 7yrs.  DEGENERATIVE JOINT DISEASE (ICD-715.90) - she has osteoarthritis and takes TYLENOL as needed (stopped Tramadol due to "dizzy & sick"). ~  11/11: she had bilat TKRs done by [name]> off work x 87mo, lots of PT, & finally improved.  PSORIATIC ARTHROPATHY (ICD-696.0) - on ENBREL 50mg /wk, (off prev MTX since her knee surg 11/11)... followed by [name] locally and prev by [name] at [facility]... also sees [name] for [employer]...  MIGRAINES, HX OF (ICD-V13.8) - on IMITREX Prn, PHENERGAN Prn... she is [name] [facility] & used to take Atenolol 25mg /d for prevention but she notes much improved now (since off Premarin), est only 2-3 HAs per yr now & doesn't need the BBlocker she says...  PERIPHERAL NEUROPATHY (ICD-356.9) - she tried Neurontin but was INTOL "it made me stupid"...  ANXIETY (ICD-300.00) >> Adult ADD  ~  She's had eval at [facility]- they wrote for [facility] & she feels improved...  Health Maintenance - GYN= [name]'s office... prev on Premarin, off now w/ hot flashes controlled after consult w/ [name] w/ CLONIDINE 0.1mg /d... Mammograms at [facility]... prev BMD w/ osteopenia per pt hx- on Calcium, Vits, Vit D OTC... rec to take ASA 81mg /d... FLP's in past w/ borderline LDL~ 110 range- on diet Rx alone... ~  Labs 4/13 by [name] showed TChol 168, TG 108, HDL 82, LDL 64 ~  Immuniz> She had 2014 Flu vaccine 10/14;  Given TDAP 10/13;    Past Surgical History  Procedure Laterality Date  . Cataract extraction, bilateral    . Nasal sinus surgery    . Abdominal hysterectomy    . Shoulder surgery    . Bilat tkrs  04/2010    by [name]    Outpatient Encounter Prescriptions as of 12/13/2013  Medication Sig  .  amphetamine-dextroamphetamine (ADDERALL XR) 30 MG 24 hr capsule Take 1 tablet by mouth Daily.  . Beclomethasone Dipropionate (QNASL)  80 MCG/ACT AERS Place 1 spray into the nose daily.  . calcium carbonate (OS-CAL) 600 MG TABS Take 600 mg by mouth 2 (two) times daily with a meal.    . etanercept (ENBREL) 50 MG/ML injection Inject 50 mg into the skin once a week.  . levothyroxine (SYNTHROID, LEVOTHROID) 50 MCG tablet Take 50 mcg by mouth daily.    . methocarbamol (ROBAXIN) 500 MG tablet 1-2 tablets by mouth daily as needed   . Multiple Vitamin (MULTIVITAMIN) tablet Take 1 tablet by mouth daily.    Marland Kitchen omeprazole (PRILOSEC) 20 MG capsule Take 20 mg by mouth daily.  . promethazine (PHENERGAN) 25 MG tablet TAKE 1 TABLET (25 MG TOTAL) BY MOUTH EVERY 6 (SIX) HOURS AS NEEDED FOR NAUSEA.  Marland Kitchen silver sulfADIAZINE (SILVADENE) 1 % cream Use as directed  . SUMAtriptan (IMITREX) 20 MG/ACT nasal spray Place 1 spray (20 mg total) into the nose every 2 (two) hours as needed.  . traZODone (DESYREL) 100 MG tablet Take 100 mg by mouth at bedtime.    Allergies  Allergen Reactions  . Nucynta [Tapentadol]     Hives  . Codeine     REACTION: severe nausea by itself  . Penicillins     REACTION: hives all over    Review of Systems         See HPI - all other systems neg except as noted...  The patient complains of weight gain, dyspnea on exertion, and difficulty walking.  The patient denies anorexia, fever, weight loss, vision loss, decreased hearing, hoarseness, chest pain, syncope, peripheral edema, prolonged cough, headaches, hemoptysis, abdominal pain, melena, hematochezia, severe indigestion/heartburn, hematuria, incontinence, muscle weakness, suspicious skin lesions, transient blindness, depression, unusual weight change, abnormal bleeding, enlarged lymph nodes, and angioedema.     Objective:   Physical Exam     WD, Overweight, 63 y/o WF in NAD... GENERAL:  Alert & oriented; pleasant &  cooperative... HEENT:  [place]/AT, EOM-wnl, PERRLA, EACs-clear, TMs-wnl, NOSE-clear, THROAT-clear & wnl. NECK:  Supple w/ fairROM; no JVD; normal carotid impulses w/o bruits; no thyromegaly or nodules palpated; no lymphadenopathy. CHEST:  Clear to P & A; without wheezes/ rales/ or rhonchi. HEART:  Regular Rhythm; without murmurs/ rubs/ or gallops. ABDOMEN:  Obese, soft & nontender; normal bowel sounds; no organomegaly or masses detected. EXT:  severe arthritic changes in knees; min varicose veins/ +venous insuffic/ tr edema/ & open area above left medial malleolus... NEURO:  CN's intact; motor testing normal; sensory testing normal; gait normal & balance OK. DERM:  few min psoriatic patches, dependent rubor in legs, overriding right 2nd toe, onychomycosis...   RADIOLOGY DATA:  Reviewed in the EPIC EMR & discussed w/ the patient...  LABORATORY DATA:  Reviewed in the EPIC EMR & discussed w/ the patient...   Assessment & Plan:    Venous stasis ulcer left medial malleolus area>> resolved w/ local therapy 7 wound care; fx big toe right foot w/ prev hammer toe surg...   AR/ Bronchitis>  Stable on antihist, nasal steroid, etc; she c/o sinus drainage, cough, etc eval by ENT & Allergy teams...  Ven Insuffic>  She did beautifully after her knee surg, no DVT, swelling, etc; reminded to elev legs, wear support hose, etc...  HYPOTHYROID>  Followed by [name] on Synthroid50 & she does her labs...  OBESITY>  Fabulous job w/ wt down 47# to 211 in 2012, but unfortunately gained up to 250+# in 2013 & beyond...  GI>  GERD/ IBS>  follwed by [name], up to  date, & stable...  DJD/ PSORIATIC Arthritis>  follwed by [name] & [name] on ENBREL Rx...  Migraine HA>  Hx migraines but stable & quiescent; followed by [name]- she request refill IMITREX nasal sp...  Other medical problems as noted>>> [facility]...   Patient's Medications  New Prescriptions   No  medications on file  Previous Medications   AMPHETAMINE-DEXTROAMPHETAMINE (ADDERALL XR) 30 MG 24 HR CAPSULE    Take 1 tablet by mouth Daily.   BECLOMETHASONE DIPROPIONATE (QNASL) 80 MCG/ACT AERS    Place 1 spray into the nose daily.   CALCIUM CARBONATE (OS-CAL) 600 MG TABS    Take 600 mg by mouth 2 (two) times daily with a meal.     ETANERCEPT (ENBREL) 50 MG/ML INJECTION    Inject 50 mg into the skin once a week.   LEVOTHYROXINE (SYNTHROID, LEVOTHROID) 50 MCG TABLET    Take 50 mcg by mouth daily.     METHOCARBAMOL (ROBAXIN) 500 MG TABLET    1-2 tablets by mouth daily as needed    MULTIPLE VITAMIN (MULTIVITAMIN) TABLET    Take 1 tablet by mouth daily.     OMEPRAZOLE (PRILOSEC) 20 MG CAPSULE    Take 20 mg by mouth daily.   PROMETHAZINE (PHENERGAN) 25 MG TABLET    TAKE 1 TABLET (25 MG TOTAL) BY MOUTH EVERY 6 (SIX) HOURS AS NEEDED FOR NAUSEA.   SILVER SULFADIAZINE (SILVADENE) 1 % CREAM    Use as directed   SUMATRIPTAN (IMITREX) 20 MG/ACT NASAL SPRAY    Place 1 spray (20 mg total) into the nose every 2 (two) hours as needed.   TRAZODONE (DESYREL) 100 MG TABLET    Take 100 mg by mouth at bedtime.  Modified Medications   No medications on file  Discontinued Medications   No medications on file

## 2013-12-18 ENCOUNTER — Encounter: Payer: Self-pay | Admitting: Pulmonary Disease

## 2014-04-20 ENCOUNTER — Encounter (HOSPITAL_BASED_OUTPATIENT_CLINIC_OR_DEPARTMENT_OTHER): Payer: BC Managed Care – PPO

## 2014-10-15 ENCOUNTER — Encounter: Payer: Self-pay | Admitting: Internal Medicine

## 2014-10-16 ENCOUNTER — Encounter: Payer: Self-pay | Admitting: Internal Medicine

## 2015-01-15 ENCOUNTER — Encounter: Payer: Self-pay | Admitting: Internal Medicine

## 2015-02-07 ENCOUNTER — Ambulatory Visit (INDEPENDENT_AMBULATORY_CARE_PROVIDER_SITE_OTHER): Payer: Commercial Managed Care - HMO | Admitting: Pulmonary Disease

## 2015-02-07 ENCOUNTER — Encounter: Payer: Self-pay | Admitting: Pulmonary Disease

## 2015-02-07 VITALS — BP 132/82 | HR 88 | Temp 98.1°F | Wt 240.8 lb

## 2015-02-07 DIAGNOSIS — G609 Hereditary and idiopathic neuropathy, unspecified: Secondary | ICD-10-CM | POA: Diagnosis not present

## 2015-02-07 DIAGNOSIS — I83212 Varicose veins of right lower extremity with both ulcer of calf and inflammation: Secondary | ICD-10-CM

## 2015-02-07 DIAGNOSIS — I83214 Varicose veins of right lower extremity with both ulcer of heel and midfoot and inflammation: Secondary | ICD-10-CM

## 2015-02-07 DIAGNOSIS — I83215 Varicose veins of right lower extremity with both ulcer other part of foot and inflammation: Secondary | ICD-10-CM

## 2015-02-07 DIAGNOSIS — L4050 Arthropathic psoriasis, unspecified: Secondary | ICD-10-CM

## 2015-02-07 DIAGNOSIS — I83211 Varicose veins of right lower extremity with both ulcer of thigh and inflammation: Secondary | ICD-10-CM | POA: Diagnosis not present

## 2015-02-07 DIAGNOSIS — I83213 Varicose veins of right lower extremity with both ulcer of ankle and inflammation: Secondary | ICD-10-CM

## 2015-02-07 DIAGNOSIS — I872 Venous insufficiency (chronic) (peripheral): Secondary | ICD-10-CM | POA: Diagnosis not present

## 2015-02-07 DIAGNOSIS — I83219 Varicose veins of right lower extremity with both ulcer of unspecified site and inflammation: Secondary | ICD-10-CM

## 2015-02-07 DIAGNOSIS — L97319 Non-pressure chronic ulcer of right ankle with unspecified severity: Secondary | ICD-10-CM

## 2015-02-07 DIAGNOSIS — I83013 Varicose veins of right lower extremity with ulcer of ankle: Secondary | ICD-10-CM

## 2015-02-07 DIAGNOSIS — I83218 Varicose veins of right lower extremity with both ulcer of other part of lower extremity and inflammation: Secondary | ICD-10-CM

## 2015-02-07 DIAGNOSIS — Z96653 Presence of artificial knee joint, bilateral: Secondary | ICD-10-CM

## 2015-02-07 MED ORDER — HYDROCODONE-ACETAMINOPHEN 5-325 MG PO TABS: 1.0000 | ORAL_TABLET | Freq: Four times a day (QID) | ORAL | Status: DC | PRN

## 2015-02-07 MED ORDER — CEPHALEXIN 500 MG PO CAPS: 500.0000 mg | ORAL_CAPSULE | Freq: Three times a day (TID) | ORAL | Status: DC

## 2015-02-07 NOTE — Progress Notes (Signed)
Subjective:    Patient ID: Sarah Horton, female    DOB: April 04, 1951, 64 y.o.   MRN: [id number]  HPI 64 y/o WF here for a follow up visit... she has multiple medical problems as noted below...   SEE PREV EPIC NOTES FOR OLDER DATA >>   ~  December 14, 2011:  Yearly ROV & Sarah Horton indicates this has been a busy year w/ lots of travel for work;  Unfortunately she has re-gaine 30# during this time- not paying attn to her diet or exercising;  She continues to have mult Ortho complaints> back pain, doing "therapy", eval by [name] w/ scoliosis; also eval for left wrist pain ?DeQuervain's tenosynovitis w/ shots from [name] & he is planning surg in Aug...    We have an office note from [facility] 4/13> psoriatic arthritis & LBP- s/p improvement from integrative therapies; tried TENS- helpful but insurance wouldn't pay; she checked labs, Rx Robaxin, continue exercises...    We reviewed prob list, meds, xrays and labs> see below>> LABS 4/13 from [name]> FLP- at goals on diet alone;  BMet- wnl w/ BS=84 A1c=5.4 & Creat=1.1;  TSH=1.98   ~  December 14, 2012:  Yearly ROV & Sarah Horton describes a busy yr, lots going on, just can't seem to lose weight;  We reviewed the following medical problems during today's office visit >>     AR, AB> on Nasonex, Astelin, Xyzal5 as needed; followed by DrSharma for allergy- prev on shots but she stopped on her own, may need retesting...    Ven Insuffic> on low sodium, elevation, support hose; not requiring diuretics & no signif edema...    Hypothy> followed by [name] on Synthroid50; we do not have notes or labs but pt assures me they are ok...    Obesity> wt= 238# & unable to lose; we reviewed diet + exercise; asked to discuss this w/ her Endocrinologist...    GI- GERD, Divertics, IBS> on Prilosec20 & Phenergan 25mg  for nausea w/ migraines; last colon 2006 by [name] w/o polyps found...    DJD & Psoriatic arthritis> followed by [name] on Enbrel, & Robaxin500Bid prn; last  seen 2/14 & note reviewed- hx psoriatic arthritis c/o pain in feet, LBP, tol Enbrel well- labs (done every 73mo) reported all nromal; they rec [facility]rap[facility]...    Hx migraines> on Imitrex nasal spray per [name] (likes the spray & wants tabs too); when last seen 2013 they tried shots in scalp for migraines- no benefit; states HAs diminished off Premarin...    Hx periph neuropathy> she has a [place]iatrist in [place] "it's like walking on rocks" & she is rec to try non-weight-bearing exercises...    Anxiety, ?ADD> she is on Adderall-XR30mg /d [facility]; they requested EKG here prior to Rx- this was done 10/13 w/ NSR, rate66, poor R prog V1-3, otherw wnl... We reviewed prob list, meds, xrays and labs> see below for updates >> she requests handicap sticker...  EKG 10/13 by TP showed NSR, rate66, poor R progression  LABS:  Last labs in Epic 1/12; she tells me that all labs done now by [name] at [facility] & [name] (asked to get copies to [place] to scan into EPIC)...   ~  June 06, 2013:  48mo ROV & add-on appt requested for sore over left medial malleolus; some discomfort, some min drainage, not red/ inflammed, no f/c/s, notes it's slow to heal; she went to [facility] y[facility] & they suggested f/u here... She has VV/ VI stasis  changes, notes a scratch on left ankle area above medial malleolus w/ broken skin that started this whole thing, she wears TED hose daily;  Exam shows VV/VI, dependent rubor, onychomycosis;  She has seen Ortho- DrHewitt in the past regarding her right foot & fx toe w/ 2nd toe overriding the 3rd... We decided to treat w/ gentle cleansing 1-2x per day, cover w/ silvadene cream & no-stick pad, plus her support hose... She is to call if not improving for wound care clinic referral...    She sees DrVanWinkle for her allergies> cough, reflux related and Rx helps; Qnasl is helping her sinuses she says...     She has DJD & psoriatic arthritis followed  regularly by [name] on Enbrel w/ labs done via her office every 25mo; also sees practitioners at [facility].Marland KitchenMarland Kitchen    ?peripheral neuropathy prev [place] in [place] and we don't have records We reviewed prob list, meds, xrays and labs> see below for updates >> she had the 2014 Flu vaccine 10/14...   LABS 12/14:  Chems- wnl;  CBC- wnl w/ WBC=8.2;  TSH=4.17  ~  December 13, 2013:  43mo ROV & Sarah Horton returns indicating that she is under considerable stress w/ husb illness (DM, ITP, FLD=>cirrhosis, DDD in neck);  She continues to get her regular medical care from her specialists that she sees w/ some regularity>>    DrWolicki (ENT- seen 4/15, note reviewed) & VanWinkle (Allergy) for AR, sinusitis; she had ENT eval & CT Sinus 4/15 showing post surg changes, no active sinus dis/ mucosal thickening/ or obstruction; he suspected noct reflux- Rx Prilosec, antireflux regimen, Dymista;  She notes that she can feel her sinuses draining & doubts this is reflux since she often hold her head down over the bed which stops the trickle & helps her cough;  Exam is neg, lungs are clear;  CXR today shows norm heart size, clear lungs, NAD (remote healed left 6th rib fx)... We reviewed effective antireflux measures...     [name] (Endocrine- we do not have recent notes from her) for Hypothyroid, Obesity (wt up 4# to 251#, BMI=37-8), Lipid checks; pt indicates that Labs 4/15 showed TSH= 2.38 and A1c= 5.1; she will get FLP done, we reviewed diet recs...    DrDeveschwar (Rheum- we do not have recent notes from her) follows her Psoriatic arthritis & DJD on Enbrel & Robaxin; she's had bilat TKRs from [name]; pt brought labs from 5/15> Chems- wnl;  CBC- ok w/ Hg=12.3 (MCV=81);  Quantiferon Gold= NEG...     She also has VenInsuffic & hx stasis ulcer on left medial malleolus area treated w/ cleansing, Silvadene cream, dressings, and eventually resolved; more recent right foot big toe fx, hx hammer toes w/ prev surg & followed by  Podiatry...  We reviewed prob list, meds, xrays and labs> see below for updates >>   Labs from [facility] 5/15 reviewed...  CXR 7/15 showed norm heart size, clear lungs, NAD (remote healed left 6th rib fx)..  ~  February 07, 2015:  125mo ROV & add-on appt requested by pt after a fall at home on sidewalk ~3wks ago; she bruised her right side but didn't break the skin; several days later she noted an ulcer at her right medial malleolus assoc w/ some redness/ drainage of clear fluid/ etc;  She has been treating herself w/ Duoderm, then recently switched to Silvadene+dressing but noted some pain in the area;  She called her Ortho- [name] who did her bilat TKRs but he couldn't see  her til Oct, and similar after checking w/ her rheum- [name] (on Enbrel for psoriatic arthritis);  She then called here for add-on appt to be checked...    As noted above she has a long list of medical specialists tending to her medical needs>  IMP/PLAN>>  We decided to treat w/ Keflex50Tis x7d and Vicodin prn pain;  She will clean once or twice daily, cover w/ Silvadene cream/ non-stick dressing/ paper tape;  She will f/u w/ her Orthopedist & rheumatologist;  Offered referral to wound clinic if necessary...           Problem List:  ALLERGIC RHINITIS (ICD-477.9) - on XYZAL 5mg /d,  NASONEX Qhs,  ASTELIN Prn... +allergy testing in past- trees & grasses mostly... ~  She is followed by [facility] at [facility]... ~  She is followed by DrWolicki for ENT w/ CT Sinuses 4/15 that was NEG...  BRONCHITIS, RECURRENT (ICD-491.9) - she's been doing well overall without recent infections etc... ~  CXR 11/11 showed normal heart size, clear lungs, healing fx left 6th rib posteriorly... ~  CXR 7/15 showed norm heart size, clear lungs, NAD (remote healed left 6th rib fx)  VENOUS INSUFFICIENCY (ICD-459.81) - she follows a low salt diet, elevates legs, and wears support hose Prn... she's had normal ArtDopplers and neg  VenDopplers in the last 62yrs... ~  7/13 & 7/14:  She notes trophic changes & discoloration of the skin, no edema however... ~  12/14:  She presented w/ a sore over the left medial malleolus slow to heal after a scratch; treated w/ cleaning bid, cover w/ Silvadene cream, no-stick pad and support hose... ~  5-7/15:  She had right great toe fx, seen by Podiatry & f/u wound care clinic... ~  9/16:  She opresented afte fall w/ ulcer developing at medial malleolus of right leg- advised cleansing daily/ Silvadene cream/ Telfa dressing w/ paper tape/ elevate etc; Rx Keflex & Vicodin & refer to wound clinic if not responding...  HYPOTHYROIDISM, BORDERLINE (ICD-244.9) - followed by [name] on LEVOTHYROID 57mcg/d;  Pt states tha she checks pt once per yr and does her thyroid labs... ~  Labs 4/13 by [name] showed TSH= 1.98 ~  She continues to f/u w/ Endocrine, [name] w/ labs done in her office...  OBESITY (ICD-278.00) - she is 5\' 7"  tall and BMI at 35-38 range... ~  weight 10/10 = 236# ~  weight 8/11 = 255# (BMI= 37-38) ~  Weight 6/12 = 211# (BMI= 33) ~  Weight 7/13 = 240# and she notes not exercising due to work & back... ~  Weight 7/14 = 238# ~  Weight 12/14 = 248# ~  Weight 7/15 = 251# and we reviewed diet, exercise, wt reduction strategies... ~  Weight 9/16 = 241#  GERD (ICD-530.81) - she uses OTC Pepcid as needed... IRRITABLE BOWEL SYNDROME (ICD-564.1) - last colonoscopy 3/06 by [name] showed divertics, hems, prob IBS... f/u planned 34yrs.  DEGENERATIVE JOINT DISEASE (ICD-715.90) - she has osteoarthritis and takes TYLENOL as needed (stopped Tramadol due to "dizzy & sick"). ~  11/11: she had bilat TKRs done by [name]> off work x 36mo, lots of PT, & finally improved.  PSORIATIC ARTHROPATHY (ICD-696.0) - on ENBREL 50mg /wk, (off prev MTX since her knee surg 11/11)... followed by [name] locally and prev [name]... also sees [name] for [employer]...  MIGRAINES, HX OF (ICD-V13.8) - on  IMITREX Prn, PHENERGAN Prn... she is [name] [facility] & used to  take Atenolol /d for prevention but she notes much improved now (since off Premarin), est only 2-3 HAs per yr now & doesn't need the BBlocker she says...  PERIPHERAL NEUROPATHY (ICD-356.9) - she tried Neurontin but was INTOL "it made me stupid"...  ANXIETY (ICD-300.00) >> Adult ADD  ~  She's had eval at [facility]- they wrote for [facility] & she feels improved...  Health Maintenance - GYN= [name]'s office... prev on Premarin, off now w/ hot flashes controlled after consult w/ [name] w/ CLONIDINE 0.1mg /d... Mammograms at [facility]... prev BMD w/ osteopenia per pt hx- on Calcium, Vits, Vit D OTC... rec to take ASA /d... FLP's in past w/ borderline LDL~ 110 range- on diet Rx alone... ~  Labs 4/13 by [name] showed TChol 168, TG 108, HDL 82, LDL 64 ~  Immuniz> She had 2014 Flu vaccine 10/14;  Given TDAP 10/13;    Past Surgical History  Procedure Laterality Date  . Cataract extraction, bilateral    . Nasal sinus surgery    . Abdominal hysterectomy    . Shoulder surgery    . Bilat tkrs  04/2010    by [name]    Outpatient Encounter Prescriptions as of 02/07/2015  Medication Sig  . amphetamine-dextroamphetamine (ADDERALL XR) 30 MG 24 hr capsule Take 1 tablet by mouth Daily.  . calcium carbonate (OS-CAL) 600 MG TABS Take 600 mg by mouth 2 (two) times daily with a meal.    . cetirizine (ZYRTEC) 10 MG tablet Take 10 mg by mouth daily.  Marland Kitchen etanercept (ENBREL) 50 MG/ML injection Inject 50 mg into the skin once a week.  . famotidine (PEPCID) 10 MG tablet Take 10 mg by mouth daily.  Marland Kitchen levothyroxine (SYNTHROID, LEVOTHROID) 50 MCG tablet Take 50 mcg by mouth daily.    . methocarbamol (ROBAXIN) 500 MG tablet 1-2 tablets by mouth daily as needed   . montelukast (SINGULAIR) 10 MG tablet   . Multiple Vitamin (MULTIVITAMIN) tablet Take 1 tablet by mouth daily.    . promethazine  (PHENERGAN) 25 MG tablet TAKE 1 TABLET (25 MG TOTAL) BY MOUTH EVERY 6 (SIX) HOURS AS NEEDED FOR NAUSEA.  Marland Kitchen silver sulfADIAZINE (SILVADENE) 1 % cream Use as directed  . SUMAtriptan (IMITREX) 20 MG/ACT nasal spray Place 1 spray (20 mg total) into the nose every 2 (two) hours as needed.  . cephALEXin (KEFLEX) 500 MG capsule Take 1 capsule (500 mg total) by mouth 3 (three) times daily.  Marland Kitchen HYDROcodone-acetaminophen (NORCO/VICODIN) 5-325 MG per tablet Take 1 tablet by mouth every 6 (six) hours as needed for moderate pain.  . [DISCONTINUED] Beclomethasone Dipropionate (QNASL) 80 MCG/ACT AERS Place 1 spray into the nose daily.  . [DISCONTINUED] omeprazole (PRILOSEC) 20 MG capsule Take 20 mg by mouth daily.  . [DISCONTINUED] traZODone (DESYREL) 100 MG tablet Take 100 mg by mouth at bedtime.   No facility-administered encounter medications on file as of 02/07/2015.    Allergies  Allergen Reactions  . Nucynta [Tapentadol]     Hives  . Codeine     REACTION: severe nausea by itself  . Penicillins     REACTION: hives all over    Review of Systems         See HPI - all other systems neg except as noted...  The patient complains of weight gain, dyspnea on exertion, and difficulty walking.  The patient denies anorexia, fever, weight loss, vision loss, decreased hearing, hoarseness, chest pain, syncope, peripheral edema, prolonged cough, headaches, hemoptysis, abdominal pain, melena, hematochezia,  severe indigestion/heartburn, hematuria, incontinence, muscle weakness, suspicious skin lesions, transient blindness, depression, unusual weight change, abnormal bleeding, enlarged lymph nodes, and angioedema.     Objective:   Physical Exam     WD, Overweight, 64 y/o WF in NAD... GENERAL:  Alert & oriented; pleasant & cooperative... HEENT:  [place]/AT, EOM-wnl, PERRLA, EACs-clear, TMs-wnl, NOSE-clear, THROAT-clear & wnl. NECK:  Supple w/ fairROM; no JVD; normal carotid impulses w/o bruits; no thyromegaly or  nodules palpated; no lymphadenopathy. CHEST:  Clear to P & A; without wheezes/ rales/ or rhonchi. HEART:  Regular Rhythm; without murmurs/ rubs/ or gallops. ABDOMEN:  Obese, soft & nontender; normal bowel sounds; no organomegaly or masses detected. EXT:  severe arthritic changes in knees; +varicose veins/ +venous insuffic/ tr edema/ & open area on right medial malleolus... NEURO:  CN's intact; motor testing normal; sensory testing normal; gait normal & balance OK. DERM:  few min psoriatic patches, dependent rubor in legs, overriding right 2nd toe, onychomycosis...   RADIOLOGY DATA:  Reviewed in the EPIC EMR & discussed w/ the patient...  LABORATORY DATA:  Reviewed in the EPIC EMR & discussed w/ the patient...   Assessment & Plan:    Venous stasis ulcer right medial malleolus area>> needs local therapy & wound care=> Rx w/ Keflex, Vicodin, Silvadene/ dressings/ etc; prev hammer toe surg, etc...   AR/ Bronchitis>  Stable on antihist, nasal steroid, etc; she c/o sinus drainage, cough, etc eval by ENT & Allergy teams...  Ven Insuffic>  She did beautifully after her knee surg, no DVT, swelling, etc; reminded to elev legs, wear support hose, etc...  HYPOTHYROID>  Followed by [name] on Synthroid50 & she does her labs...  OBESITY>  Fabulous job w/ wt down 47# to 211 in 2012, but unfortunately gained up to 250+# in 2013 & beyond...  GI>  GERD/ IBS>  follwed by [name], up to date, & stable...  DJD/ PSORIATIC Arthritis>  follwed by [name] & [name] on ENBREL Rx...  Migraine HA>  Hx migraines but stable & quiescent; followed by [name]- she request refill IMITREX nasal sp...  Other medical problems as noted>>> pt will get [facility] w/ copy to me...   Patient's Medications  New Prescriptions   CEPHALEXIN (KEFLEX) 500 MG CAPSULE    Take 1 capsule (500 mg total) by mouth 3 (three) times daily.   HYDROCODONE-ACETAMINOPHEN (NORCO/VICODIN) 5-325 MG PER TABLET    Take 1  tablet by mouth every 6 (six) hours as needed for moderate pain.  Previous Medications   AMPHETAMINE-DEXTROAMPHETAMINE (ADDERALL XR) 30 MG 24 HR CAPSULE    Take 1 tablet by mouth Daily.   CALCIUM CARBONATE (OS-CAL) 600 MG TABS    Take 600 mg by mouth 2 (two) times daily with a meal.     CETIRIZINE (ZYRTEC) 10 MG TABLET    Take 10 mg by mouth daily.   ETANERCEPT (ENBREL) 50 MG/ML INJECTION    Inject 50 mg into the skin once a week.   FAMOTIDINE (PEPCID) 10 MG TABLET    Take 10 mg by mouth daily.   LEVOTHYROXINE (SYNTHROID, LEVOTHROID) 50 MCG TABLET    Take 50 mcg by mouth daily.     METHOCARBAMOL (ROBAXIN) 500 MG TABLET    1-2 tablets by mouth daily as needed    MONTELUKAST (SINGULAIR) 10 MG TABLET       MULTIPLE VITAMIN (MULTIVITAMIN) TABLET    Take 1 tablet by mouth daily.     PROMETHAZINE (PHENERGAN) 25 MG TABLET  TAKE 1 TABLET (25 MG TOTAL) BY MOUTH EVERY 6 (SIX) HOURS AS NEEDED FOR NAUSEA.   SILVER SULFADIAZINE (SILVADENE) 1 % CREAM    Use as directed   SUMATRIPTAN (IMITREX) 20 MG/ACT NASAL SPRAY    Place 1 spray (20 mg total) into the nose every 2 (two) hours as needed.  Modified Medications   No medications on file  Discontinued Medications   BECLOMETHASONE DIPROPIONATE (QNASL) 80 MCG/ACT AERS    Place 1 spray into the nose daily.   OMEPRAZOLE (PRILOSEC) 20 MG CAPSULE    Take 20 mg by mouth daily.   TRAZODONE (DESYREL) 100 MG TABLET    Take 100 mg by mouth at bedtime.

## 2015-02-07 NOTE — Patient Instructions (Signed)
Today we updated your med list in our EPIC system...    Continue your current medications the same...  For your right leg venous stasis ulcer>    We decided to treat w/ KEFLEX (generic)  three times daily for 7d...    Cleanse the area once or twice daily, cover w/ the Silvadene cream/ non-stick dressing & paper taper    Keep your leg elevated as much as poss...    We wrote for some Hydrocodone for pain as needed...  Be sure to follow up w/ [name] & [name] as planned...  Call for any questions or if we can be of service in any way.Marland KitchenMarland Kitchen

## 2015-02-27 ENCOUNTER — Encounter (HOSPITAL_BASED_OUTPATIENT_CLINIC_OR_DEPARTMENT_OTHER): Payer: Commercial Managed Care - HMO | Attending: Surgery

## 2015-02-27 DIAGNOSIS — K219 Gastro-esophageal reflux disease without esophagitis: Secondary | ICD-10-CM | POA: Insufficient documentation

## 2015-02-27 DIAGNOSIS — Z9841 Cataract extraction status, right eye: Secondary | ICD-10-CM | POA: Diagnosis not present

## 2015-02-27 DIAGNOSIS — F988 Other specified behavioral and emotional disorders with onset usually occurring in childhood and adolescence: Secondary | ICD-10-CM | POA: Insufficient documentation

## 2015-02-27 DIAGNOSIS — Z87891 Personal history of nicotine dependence: Secondary | ICD-10-CM | POA: Diagnosis not present

## 2015-02-27 DIAGNOSIS — G629 Polyneuropathy, unspecified: Secondary | ICD-10-CM | POA: Insufficient documentation

## 2015-02-27 DIAGNOSIS — L4050 Arthropathic psoriasis, unspecified: Secondary | ICD-10-CM | POA: Insufficient documentation

## 2015-02-27 DIAGNOSIS — Z79899 Other long term (current) drug therapy: Secondary | ICD-10-CM | POA: Insufficient documentation

## 2015-02-27 DIAGNOSIS — I872 Venous insufficiency (chronic) (peripheral): Secondary | ICD-10-CM | POA: Diagnosis not present

## 2015-02-27 DIAGNOSIS — M1990 Unspecified osteoarthritis, unspecified site: Secondary | ICD-10-CM | POA: Insufficient documentation

## 2015-02-27 DIAGNOSIS — Z9842 Cataract extraction status, left eye: Secondary | ICD-10-CM | POA: Insufficient documentation

## 2015-02-27 DIAGNOSIS — I739 Peripheral vascular disease, unspecified: Secondary | ICD-10-CM | POA: Diagnosis not present

## 2015-02-27 DIAGNOSIS — E039 Hypothyroidism, unspecified: Secondary | ICD-10-CM | POA: Insufficient documentation

## 2015-02-27 DIAGNOSIS — L97311 Non-pressure chronic ulcer of right ankle limited to breakdown of skin: Secondary | ICD-10-CM | POA: Insufficient documentation

## 2015-03-05 ENCOUNTER — Encounter (HOSPITAL_COMMUNITY): Payer: Commercial Managed Care - HMO

## 2015-03-05 ENCOUNTER — Other Ambulatory Visit: Payer: Self-pay | Admitting: Surgery

## 2015-03-05 DIAGNOSIS — L97909 Non-pressure chronic ulcer of unspecified part of unspecified lower leg with unspecified severity: Secondary | ICD-10-CM

## 2015-03-06 DIAGNOSIS — L97311 Non-pressure chronic ulcer of right ankle limited to breakdown of skin: Secondary | ICD-10-CM | POA: Diagnosis not present

## 2015-03-08 ENCOUNTER — Other Ambulatory Visit: Payer: Self-pay | Admitting: Surgery

## 2015-03-08 ENCOUNTER — Ambulatory Visit (HOSPITAL_COMMUNITY)
Admission: RE | Admit: 2015-03-08 | Discharge: 2015-03-08 | Disposition: A | Discharge status: Home / Self Care | Payer: Commercial Managed Care - HMO | Source: Ambulatory Visit | Attending: Vascular Surgery | Admitting: Vascular Surgery

## 2015-03-08 DIAGNOSIS — I8390 Asymptomatic varicose veins of unspecified lower extremity: Secondary | ICD-10-CM

## 2015-03-08 DIAGNOSIS — I83214 Varicose veins of right lower extremity with both ulcer of heel and midfoot and inflammation: Secondary | ICD-10-CM

## 2015-03-08 DIAGNOSIS — I868 Varicose veins of other specified sites: Secondary | ICD-10-CM

## 2015-03-08 DIAGNOSIS — I83211 Varicose veins of right lower extremity with both ulcer of thigh and inflammation: Secondary | ICD-10-CM | POA: Insufficient documentation

## 2015-03-08 DIAGNOSIS — L97909 Non-pressure chronic ulcer of unspecified part of unspecified lower leg with unspecified severity: Secondary | ICD-10-CM

## 2015-03-08 DIAGNOSIS — I83212 Varicose veins of right lower extremity with both ulcer of calf and inflammation: Secondary | ICD-10-CM | POA: Diagnosis not present

## 2015-03-08 DIAGNOSIS — I83013 Varicose veins of right lower extremity with ulcer of ankle: Secondary | ICD-10-CM

## 2015-03-08 DIAGNOSIS — L97319 Non-pressure chronic ulcer of right ankle with unspecified severity: Secondary | ICD-10-CM

## 2015-03-08 DIAGNOSIS — I83215 Varicose veins of right lower extremity with both ulcer other part of foot and inflammation: Secondary | ICD-10-CM

## 2015-03-08 DIAGNOSIS — I83219 Varicose veins of right lower extremity with both ulcer of unspecified site and inflammation: Secondary | ICD-10-CM

## 2015-03-08 DIAGNOSIS — I83218 Varicose veins of right lower extremity with both ulcer of other part of lower extremity and inflammation: Secondary | ICD-10-CM

## 2015-03-08 DIAGNOSIS — I83213 Varicose veins of right lower extremity with both ulcer of ankle and inflammation: Secondary | ICD-10-CM | POA: Diagnosis not present

## 2015-03-13 ENCOUNTER — Encounter (HOSPITAL_BASED_OUTPATIENT_CLINIC_OR_DEPARTMENT_OTHER): Payer: Commercial Managed Care - HMO | Attending: Surgery

## 2015-03-13 DIAGNOSIS — Z87891 Personal history of nicotine dependence: Secondary | ICD-10-CM | POA: Diagnosis not present

## 2015-03-13 DIAGNOSIS — M1990 Unspecified osteoarthritis, unspecified site: Secondary | ICD-10-CM | POA: Diagnosis not present

## 2015-03-13 DIAGNOSIS — L97811 Non-pressure chronic ulcer of other part of right lower leg limited to breakdown of skin: Secondary | ICD-10-CM | POA: Insufficient documentation

## 2015-03-13 DIAGNOSIS — I872 Venous insufficiency (chronic) (peripheral): Secondary | ICD-10-CM | POA: Diagnosis not present

## 2015-03-13 DIAGNOSIS — Z79899 Other long term (current) drug therapy: Secondary | ICD-10-CM | POA: Insufficient documentation

## 2015-03-13 DIAGNOSIS — K219 Gastro-esophageal reflux disease without esophagitis: Secondary | ICD-10-CM | POA: Insufficient documentation

## 2015-03-13 DIAGNOSIS — E669 Obesity, unspecified: Secondary | ICD-10-CM | POA: Diagnosis not present

## 2015-03-13 DIAGNOSIS — G629 Polyneuropathy, unspecified: Secondary | ICD-10-CM | POA: Insufficient documentation

## 2015-03-13 DIAGNOSIS — Z6834 Body mass index (BMI) 34.0-34.9, adult: Secondary | ICD-10-CM | POA: Insufficient documentation

## 2015-03-13 DIAGNOSIS — E039 Hypothyroidism, unspecified: Secondary | ICD-10-CM | POA: Diagnosis not present

## 2015-03-13 DIAGNOSIS — I739 Peripheral vascular disease, unspecified: Secondary | ICD-10-CM | POA: Insufficient documentation

## 2015-03-13 DIAGNOSIS — L409 Psoriasis, unspecified: Secondary | ICD-10-CM | POA: Insufficient documentation

## 2015-03-20 DIAGNOSIS — L97811 Non-pressure chronic ulcer of other part of right lower leg limited to breakdown of skin: Secondary | ICD-10-CM | POA: Diagnosis not present

## 2015-03-29 ENCOUNTER — Encounter: Payer: Self-pay | Admitting: Vascular Surgery

## 2015-04-03 ENCOUNTER — Encounter: Payer: Commercial Managed Care - HMO | Admitting: Vascular Surgery

## 2015-04-04 ENCOUNTER — Encounter: Payer: Self-pay | Admitting: Vascular Surgery

## 2015-04-05 ENCOUNTER — Encounter: Payer: Self-pay | Admitting: Vascular Surgery

## 2015-04-05 ENCOUNTER — Ambulatory Visit (INDEPENDENT_AMBULATORY_CARE_PROVIDER_SITE_OTHER): Payer: Commercial Managed Care - HMO | Admitting: Vascular Surgery

## 2015-04-05 VITALS — BP 140/89 | HR 98 | Ht 68.5 in | Wt 237.0 lb

## 2015-04-05 DIAGNOSIS — I878 Other specified disorders of veins: Secondary | ICD-10-CM | POA: Diagnosis not present

## 2015-04-05 DIAGNOSIS — I83013 Varicose veins of right lower extremity with ulcer of ankle: Secondary | ICD-10-CM | POA: Diagnosis not present

## 2015-04-05 DIAGNOSIS — I872 Venous insufficiency (chronic) (peripheral): Secondary | ICD-10-CM | POA: Diagnosis not present

## 2015-04-05 DIAGNOSIS — L97319 Non-pressure chronic ulcer of right ankle with unspecified severity: Secondary | ICD-10-CM

## 2015-04-05 DIAGNOSIS — L97919 Non-pressure chronic ulcer of unspecified part of right lower leg with unspecified severity: Secondary | ICD-10-CM

## 2015-04-05 DIAGNOSIS — I83019 Varicose veins of right lower extremity with ulcer of unspecified site: Secondary | ICD-10-CM | POA: Diagnosis not present

## 2015-04-05 DIAGNOSIS — I83009 Varicose veins of unspecified lower extremity with ulcer of unspecified site: Secondary | ICD-10-CM

## 2015-04-05 DIAGNOSIS — L97909 Non-pressure chronic ulcer of unspecified part of unspecified lower leg with unspecified severity: Secondary | ICD-10-CM

## 2015-04-05 NOTE — Progress Notes (Signed)
History of Present Illness  Sarah Horton is a 64 y.o. female who presents with chief complaint: swollen leg.  Patient notes, onset of swelling after a long plane ride 2 years ago.  She developed an ulcer on the left medial malleolus.  Then 6 months later she scratched her right ankle and developed an ulcer on the right medial malleolus that wouldn't heal.  She was diagnosed with venous insufficiency.  She has been wearing compression stockings for 1.5 years.  She currently has small ulcers bilaterally and has been treated at the local wound center.  She also has brawny skin changes to bilateral LE. Her past medical history included  Psoriatic arthritis currently managed with Enbrel.  She denise hypertension, hypercholesteremia and DM.  Past Medical History  Diagnosis Date  . Allergic rhinitis, cause unspecified   . Unspecified chronic bronchitis (HCC)   . Unspecified venous (peripheral) insufficiency   . Unspecified hypothyroidism   . Obesity, unspecified   . Esophageal reflux   . Irritable bowel syndrome   . Psoriatic arthropathy (HCC)   . Unspecified hereditary and idiopathic peripheral neuropathy   . Anxiety state, unspecified   . Anemia, unspecified     Past Surgical History  Procedure Laterality Date  . Cataract extraction, bilateral    . Nasal sinus surgery    . Abdominal hysterectomy    . Shoulder surgery    . Bilat tkrs  04/2010    by [name]  . Joint replacement Bilateral     knees  . Bunionectomy    . Dequervains tensenovitis Left     Social History   Social History  . Marital Status: Married    Spouse Name: N/A  . Number of Children: N/A  . Years of Education: N/A   Occupational History  . Compliance officer    Social History Main Topics  . Smoking status: Former Smoker -- 1.50 packs/day for 22 years    Types: Cigarettes    Quit date: 06/08/1992  . Smokeless tobacco: Never Used  . Alcohol Use: Yes     Comment: occasional wine  .  Drug Use: No  . Sexual Activity: Not on file   Other Topics Concern  . Not on file   Social History Narrative    Family History  Problem Relation Age of Onset  . Cirrhosis Father   . COPD Mother   . Heart disease Mother   . Hypertension Mother   . Varicose Veins Mother     Current Outpatient Prescriptions on File Prior to Visit  Medication Sig Dispense Refill  . amphetamine-dextroamphetamine (ADDERALL XR) 30 MG 24 hr capsule Take 1 tablet by mouth Daily.    . calcium carbonate (OS-CAL) 600 MG TABS Take 600 mg by mouth 2 (two) times daily with a meal.      . cetirizine (ZYRTEC) 10 MG tablet Take 10 mg by mouth daily.    Marland Kitchen etanercept (ENBREL) 50 MG/ML injection Inject 50 mg into the skin once a week.    . famotidine (PEPCID) 10 MG tablet Take 10 mg by mouth daily.    Marland Kitchen HYDROcodone-acetaminophen (NORCO/VICODIN) 5-325 MG per tablet Take 1 tablet by mouth every 6 (six) hours as needed for moderate pain. 50 tablet 0  . levothyroxine (SYNTHROID, LEVOTHROID) 50 MCG tablet Take 75 mcg by mouth daily.     . methocarbamol (ROBAXIN) 500 MG tablet 1-2 tablets by mouth daily as needed     . montelukast (SINGULAIR)  10 MG tablet     . Multiple Vitamin (MULTIVITAMIN) tablet Take 1 tablet by mouth daily.      . promethazine (PHENERGAN) 25 MG tablet TAKE 1 TABLET (25 MG TOTAL) BY MOUTH EVERY 6 (SIX) HOURS AS NEEDED FOR NAUSEA. 30 tablet 0  . silver sulfADIAZINE (SILVADENE) 1 % cream Use as directed 50 g 1  . SUMAtriptan (IMITREX) 20 MG/ACT nasal spray Place 1 spray (20 mg total) into the nose every 2 (two) hours as needed. 1 Inhaler 5  . cephALEXin (KEFLEX) 500 MG capsule Take 1 capsule (500 mg total) by mouth 3 (three) times daily. (Patient not taking: Reported on 04/05/2015) 21 capsule 0   No current facility-administered medications on file prior to visit.    Allergies as of 04/05/2015 - Review Complete 04/05/2015  Allergen Reaction Noted  . Nucynta [tapentadol]  04/04/2012  . Codeine    .  Penicillins       ROS:   General:  No weight loss, Fever, chills  HEENT: No recent headaches, no nasal bleeding, no visual changes, no sore throat  Neurologic: No dizziness, blackouts, seizures. No recent symptoms of stroke or mini- stroke. No recent episodes of slurred speech, or temporary blindness.  Cardiac: No recent episodes of chest pain/pressure, no shortness of breath at rest.  No shortness of breath with exertion.  Denies history of atrial fibrillation or irregular heartbeat  Vascular: No history of rest pain in feet.  No history of claudication.  Positive history of non-healing ulcer, No history of DVT   Pulmonary: No home oxygen, no productive cough, no hemoptysis,  No asthma or wheezing  Musculoskeletal:  [x ] Arthritis, [ ]  Low back pain,  [ x] Joint pain  Hematologic:No history of hypercoagulable state.  No history of easy bleeding.  No history of anemia  Gastrointestinal: No hematochezia or melena,  No gastroesophageal reflux, no trouble swallowing  Urinary: [ ]  chronic Kidney disease, [ ]  on HD - [ ]  MWF or [ ]  TTHS, [ ]  Burning with urination, [ ]  Frequent urination, [ ]  Difficulty urinating;   Skin: No rashes  Psychological: No history of anxiety,  No history of depression   Physical Examination  Filed Vitals:   04/05/15 1529 04/05/15 1539  BP: 158/98 140/89  Pulse: 98   Height: 5' 8.5" (1.74 m)   Weight: 237 lb (107.502 kg)   SpO2: 100%     Body mass index is 35.51 kg/(m^2).  General:  Alert and oriented, no acute distress HEENT: Normal Neck: No bruit or JVD Pulmonary: Clear to auscultation bilaterally Cardiac: Regular Rate and Rhythm without murmur Abdomen: Soft, non-tender, non-distended, no mass, no scars Skin: Brawny skin discoloration, medial malleolus ulcer bilaterally 2 mm in circumfrance Extremity Pulses:  2+ radial, brachial, femoral, dorsalis pedis, posterior tibial pulses bilaterally Musculoskeletal: No deformity or edema  Neurologic:  Upper and lower extremity motor 5/5 and symmetric Psychiatric: Judgment intact, Mood & affect appropriate for pt's clinical situation Lymph : No Cervical, Axillary, or Inguinal lymphadenopathy    DATA: Venous reflux study Bilateral GSV reflux No DVT   Assessment: Chronic venous insufficiency Venous ulcers Varicose veins   Plan: She will wear thigh high compression stockings 20-30 mm hg , she has worn the knee high for 1.5 years We will schedule her for a follow up visit with the vein clinic to  Discuss vein ablation procedures.   She will continue to follow up with the wound center as needed for her ulcer care.  Thomasena Edis, Sarah Horton Sarah Horton [facility]  The patient was seen in conjunction with Dr. Imogene Burn  Addendum  I have independently interviewed and examined the patient, and I agree with the physician assistant's findings.  Pt has limited deep system venous reflux B with predominantly B GSV reflux (L>R).  Given presence of bilateral venous stasis ulcers, she likely would benefit from B GSV EVLA.  The patient has been using knee high 20-30 mm Hg compression stockings.  Will start her in a thigh high 20-30 mm Hg stockings and have her follow up in 3 months in the Vein Clinic.  Leonides Sake, MD [facility] [place] [facility] Office: [phone] Pager: [phone]  04/05/2015, 4:28 PM

## 2015-04-18 ENCOUNTER — Encounter: Payer: Self-pay | Admitting: Internal Medicine

## 2015-05-21 ENCOUNTER — Telehealth: Payer: Self-pay | Admitting: Pulmonary Disease

## 2015-05-21 NOTE — Telephone Encounter (Signed)
Spoke with pt. States that she has another wound on her ankle. Feels like she needs to see someone to see her. Wants to know if she should go to an UC. Advised her that if she feels like she needs to be seen then she should do that. She verbalized understanding. Nothing further was needed.

## 2015-05-25 ENCOUNTER — Emergency Department (HOSPITAL_BASED_OUTPATIENT_CLINIC_OR_DEPARTMENT_OTHER)
Admission: EM | Admit: 2015-05-25 | Discharge: 2015-05-25 | Disposition: A | Discharge status: Home / Self Care | Payer: Commercial Managed Care - HMO | Attending: Emergency Medicine | Admitting: Emergency Medicine

## 2015-05-25 ENCOUNTER — Emergency Department (HOSPITAL_BASED_OUTPATIENT_CLINIC_OR_DEPARTMENT_OTHER): Payer: Commercial Managed Care - HMO

## 2015-05-25 ENCOUNTER — Encounter (HOSPITAL_BASED_OUTPATIENT_CLINIC_OR_DEPARTMENT_OTHER): Payer: Self-pay | Admitting: Emergency Medicine

## 2015-05-25 DIAGNOSIS — Z87891 Personal history of nicotine dependence: Secondary | ICD-10-CM | POA: Diagnosis not present

## 2015-05-25 DIAGNOSIS — Z8669 Personal history of other diseases of the nervous system and sense organs: Secondary | ICD-10-CM | POA: Insufficient documentation

## 2015-05-25 DIAGNOSIS — Z872 Personal history of diseases of the skin and subcutaneous tissue: Secondary | ICD-10-CM | POA: Diagnosis not present

## 2015-05-25 DIAGNOSIS — Z8709 Personal history of other diseases of the respiratory system: Secondary | ICD-10-CM | POA: Diagnosis not present

## 2015-05-25 DIAGNOSIS — Z880 Allergy status to penicillin: Secondary | ICD-10-CM | POA: Diagnosis not present

## 2015-05-25 DIAGNOSIS — E039 Hypothyroidism, unspecified: Secondary | ICD-10-CM | POA: Diagnosis not present

## 2015-05-25 DIAGNOSIS — F419 Anxiety disorder, unspecified: Secondary | ICD-10-CM | POA: Insufficient documentation

## 2015-05-25 DIAGNOSIS — M7989 Other specified soft tissue disorders: Secondary | ICD-10-CM | POA: Diagnosis present

## 2015-05-25 DIAGNOSIS — Z862 Personal history of diseases of the blood and blood-forming organs and certain disorders involving the immune mechanism: Secondary | ICD-10-CM | POA: Insufficient documentation

## 2015-05-25 DIAGNOSIS — K219 Gastro-esophageal reflux disease without esophagitis: Secondary | ICD-10-CM | POA: Insufficient documentation

## 2015-05-25 DIAGNOSIS — L97329 Non-pressure chronic ulcer of left ankle with unspecified severity: Secondary | ICD-10-CM | POA: Diagnosis not present

## 2015-05-25 DIAGNOSIS — L03116 Cellulitis of left lower limb: Secondary | ICD-10-CM | POA: Diagnosis not present

## 2015-05-25 DIAGNOSIS — Z79899 Other long term (current) drug therapy: Secondary | ICD-10-CM | POA: Diagnosis not present

## 2015-05-25 DIAGNOSIS — IMO0001 Reserved for inherently not codable concepts without codable children: Secondary | ICD-10-CM

## 2015-05-25 DIAGNOSIS — I83028 Varicose veins of left lower extremity with ulcer other part of lower leg: Secondary | ICD-10-CM | POA: Insufficient documentation

## 2015-05-25 DIAGNOSIS — E669 Obesity, unspecified: Secondary | ICD-10-CM | POA: Insufficient documentation

## 2015-05-25 MED ORDER — CLINDAMYCIN HCL 300 MG PO CAPS: 300.0000 mg | ORAL_CAPSULE | Freq: Four times a day (QID) | ORAL | Status: DC

## 2015-05-25 MED ORDER — HYDROCODONE-ACETAMINOPHEN 5-325 MG PO TABS: 1.0000 | ORAL_TABLET | ORAL | Status: DC | PRN

## 2015-05-25 MED ORDER — CLINDAMYCIN HCL 150 MG PO CAPS
300.0000 mg | ORAL_CAPSULE | Freq: Once | ORAL | Status: AC
  Administered 2015-05-25: 300 mg via ORAL
  Filled 2015-05-25: qty 2

## 2015-05-25 MED ORDER — OXYCODONE-ACETAMINOPHEN 5-325 MG PO TABS
1.0000 | ORAL_TABLET | Freq: Once | ORAL | Status: AC
  Administered 2015-05-25: 1 via ORAL
  Filled 2015-05-25: qty 1

## 2015-05-25 NOTE — ED Notes (Signed)
"  feels a little better", NAD, calm, interactive, foot elevated while waiting, Dr. Fredderick PhenixBelfi in to see pt[facility]

## 2015-05-25 NOTE — ED Notes (Signed)
Patient transported to X-ray 

## 2015-05-25 NOTE — ED Provider Notes (Signed)
CSN: [id number]     Arrival date & time 05/25/15  1833 History  By signing my name below, I, Sarah Horton, attest that this documentation has been prepared under the direction and in the presence of Rolan Bucco, MD. Electronically Signed: Evon Slack, ED Scribe. 05/25/2015. 10:28 PM.    Chief Complaint  Patient presents with  . Wound Check    The history is provided by the patient. No language interpreter was used.   HPI Comments: Sarah Horton is a 64 y.o. female who presents to the Emergency Department complaining of painful wound to her left foot onset October 2016. Pt states that the wound is progressively worsening. Pt has associated redness and swelling to the left foot and lower left leg. Pt states that she has been applying OTC cream with no relief. Pt denies fever, vomiting or other related symptoms. Pt denies injury to the left foot. Pt states that she has tried getting follow up with the wound care but states that she cant for up to 3 weeks. Pt states that she has a HX of varicose veins. Pt states that her tetanus is UTD.  Has been seen in Wound care clinic for similar wound to right leg which finally healed on October.  Past Medical History  Diagnosis Date  . Allergic rhinitis, cause unspecified   . Unspecified chronic bronchitis (HCC)   . Unspecified venous (peripheral) insufficiency   . Unspecified hypothyroidism   . Obesity, unspecified   . Esophageal reflux   . Irritable bowel syndrome   . Psoriatic arthropathy (HCC)   . Unspecified hereditary and idiopathic peripheral neuropathy   . Anxiety state, unspecified   . Anemia, unspecified    Past Surgical History  Procedure Laterality Date  . Cataract extraction, bilateral    . Nasal sinus surgery    . Abdominal hysterectomy    . Shoulder surgery    . Bilat tkrs  04/2010    by [name]  . Joint replacement Bilateral     knees  . Bunionectomy    . Dequervains tensenovitis Left    Family History   Problem Relation Age of Onset  . Cirrhosis Father   . COPD Mother   . Heart disease Mother   . Hypertension Mother   . Varicose Veins Mother    Social History  Substance Use Topics  . Smoking status: Former Smoker -- 1.50 packs/day for 22 years    Types: Cigarettes    Quit date: 06/08/1992  . Smokeless tobacco: Never Used  . Alcohol Use: Yes     Comment: occasional wine   OB History    No data available     Review of Systems  Constitutional: Negative for fever, chills, diaphoresis and fatigue.  HENT: Negative for congestion, rhinorrhea and sneezing.   Eyes: Negative.   Respiratory: Negative for cough, chest tightness and shortness of breath.   Cardiovascular: Negative for chest pain and leg swelling.  Gastrointestinal: Negative for nausea, vomiting, abdominal pain, diarrhea and blood in stool.  Genitourinary: Negative for frequency, hematuria, flank pain and difficulty urinating.  Musculoskeletal: Negative for back pain and arthralgias.  Skin: Positive for color change and wound. Negative for rash.  Neurological: Negative for dizziness, speech difficulty, weakness, numbness and headaches.      Allergies  Nucynta; Codeine; and Penicillins  Home Medications   Prior to Admission medications   Medication Sig Start Date End Date Taking? Authorizing Provider  amphetamine-dextroamphetamine (ADDERALL XR) 30 MG 24 hr capsule Take 1  tablet by mouth Daily. 03/10/12   Historical Provider, MD  calcium carbonate (OS-CAL) 600 MG TABS Take 600 mg by mouth 2 (two) times daily with a meal.      Historical Provider, MD  cephALEXin (KEFLEX) 500 MG capsule Take 1 capsule (500 mg total) by mouth 3 (three) times daily. Patient not taking: Reported on 04/05/2015 02/07/15   Michele Mcalpine, MD  cetirizine (ZYRTEC) 10 MG tablet Take 10 mg by mouth daily.    Historical Provider, MD  clindamycin (CLEOCIN) 300 MG capsule Take 1 capsule (300 mg total) by mouth 4 (four) times daily. X 7 days 05/25/15    Rolan Bucco, MD  etanercept (ENBREL) 50 MG/ML injection Inject 50 mg into the skin once a week.    Historical Provider, MD  famotidine (PEPCID) 10 MG tablet Take 10 mg by mouth daily.    Historical Provider, MD  flunisolide (NASALIDE) 25 MCG/ACT (0.025%) SOLN  01/01/15   Historical Provider, MD  HYDROcodone-acetaminophen (NORCO/VICODIN) 5-325 MG per tablet Take 1 tablet by mouth every 6 (six) hours as needed for moderate pain. 02/07/15   Michele Mcalpine, MD  lactobacillus acidophilus (BACID) TABS tablet Take 1 tablet by mouth daily.    Historical Provider, MD  levothyroxine (SYNTHROID, LEVOTHROID) 50 MCG tablet Take 75 mcg by mouth daily.     Historical Provider, MD  MAGNESIUM PO Take 750 mg by mouth daily.    Historical Provider, MD  methocarbamol (ROBAXIN) 500 MG tablet 1-2 tablets by mouth daily as needed     Historical Provider, MD  montelukast (SINGULAIR) 10 MG tablet  01/31/15   Historical Provider, MD  Multiple Vitamin (MULTIVITAMIN) tablet Take 1 tablet by mouth daily.      Historical Provider, MD  promethazine (PHENERGAN) 25 MG tablet TAKE 1 TABLET (25 MG TOTAL) BY MOUTH EVERY 6 (SIX) HOURS AS NEEDED FOR NAUSEA. 12/30/12   Michele Mcalpine, MD  silver sulfADIAZINE (SILVADENE) 1 % cream Use as directed 06/06/13   Michele Mcalpine, MD  SUMAtriptan (IMITREX) 20 MG/ACT nasal spray Place 1 spray (20 mg total) into the nose every 2 (two) hours as needed. 12/14/12   Michele Mcalpine, MD  SYNTHROID 75 MCG tablet  03/29/15   Historical Provider, MD   BP 155/79 mmHg  Pulse 78  Temp(Src) 97.7 F (36.5 C) (Oral)  Resp 16  SpO2 98%   Physical Exam  Constitutional: She is oriented to person, place, and time. She appears well-developed and well-nourished.  HENT:  Head: Normocephalic and atraumatic.  Eyes: Pupils are equal, round, and reactive to light.  Neck: Normal range of motion. Neck supple.  Cardiovascular: Normal rate, regular rhythm and normal heart sounds.   Pulmonary/Chest: Effort normal and breath  sounds normal. No respiratory distress. She has no wheezes. She has no rales. She exhibits no tenderness.  Abdominal: Soft. Bowel sounds are normal. There is no tenderness. There is no rebound and no guarding.  Musculoskeletal: Normal range of motion. She exhibits no edema.  Patient has some chronic appearing skin changes to her left lower extremity which she states is secondary to vascular insufficiency. She has a 3 cm wound with overlying eschar to her left ankle overlying the medial malleolus.  There is some surrounding redness to the foot and extending to the lower leg. Pedal pulses are intact. There is no drainage from the wounds.  Lymphadenopathy:    She has no cervical adenopathy.  Neurological: She is alert and oriented to person, place, and  time.  Skin: Skin is warm and dry. No rash noted.  Psychiatric: She has a normal mood and affect.    ED Course  Procedures (including critical care time) DIAGNOSTIC STUDIES: Oxygen Saturation is 99% on RA, normal by my interpretation.    COORDINATION OF CARE: 8:50 PM-Discussed treatment plan with pt at bedside and pt agreed to plan.     Labs Review Labs Reviewed - No data to display  Imaging Review Dg Ankle Complete Left  05/25/2015  CLINICAL DATA:  Painful 1 to left foot onset November of 2016. Wound progressively worsening. Associated redness and swelling to the left foot and lower left leg. Denies injury. EXAM: LEFT ANKLE COMPLETE - 3+ VIEW COMPARISON:  None. FINDINGS: Three views of the left ankle are provided. Osseous alignment is normal. Bone mineralization appears normal throughout. No fracture line or displaced fracture fragment seen. No acute -appearing cortical irregularity or osseous lesion. No erosions, destructive changes or other secondary signs of osteomyelitis. Ankle mortise is symmetric and there are no significant degenerative changes. No evidence of joint effusion seen. Vascular calcifications are seen within the surrounding  soft tissues. There is probable soft tissue edema at the level of the left ankle. Surrounding soft tissues otherwise unremarkable. IMPRESSION: 1. No osseous abnormality. No osseous fracture or dislocation. No evidence of osteomyelitis seen. 2. Vascular calcifications within the soft tissues. Probable soft tissue edema. No soft tissue gas seen. Electronically Signed   By: Bary RichardStan  Maynard M.D.   On: 05/25/2015 21:36      EKG Interpretation None      MDM   Final diagnoses:  Venous stasis ulcer, left (HCC)   There is no evidence of bony involvement. There is no evidence of necrotizing fasciitis. She has a wound with surrounding cellulitis. The wound is dry, nondraining. She was started on clindamycin. The wound was cleaned and dressed in the ED. She was advised in elevation. She was instructed to have follow-up with her PCP for recheck within the next few days. She has an appointment with the wound care clinic in about 3 weeks. She was advised to return here if she has any worsening symptoms.   I personally performed the services described in this documentation, which was scribed in my presence.  The recorded information has been reviewed and considered.      Rolan BuccoMelanie Merin Borjon, MD 05/25/15 2229

## 2015-05-25 NOTE — ED Notes (Signed)
Patient states that she has a new wound to her left foot. Was released from Wound care clinic for wound on her right foot.

## 2015-05-25 NOTE — Discharge Instructions (Signed)
Venous Ulcer A venous ulcer is a shallow sore on your lower leg that is caused by poor circulation in your veins. This condition used to be called stasis ulcer.  Veins have valves that help return blood to the heart. If these valves do not work properly, it can cause blood to flow backward and to back up into the veins near the skin. When that happens, blood can pool in your lower legs. The blood can then leak out of your veins, which can irritate your skin. This may cause a break in your skin that becomes a venous ulcer. Venous ulcer is the most common type of lower leg ulcer. You may have venous ulcers on one leg or on both legs. The area where this condition most commonly develops is around the ankles. A venous ulcer may last for a long time (chronic ulcer) or it may return repeatedly (recurrent ulcer). CAUSES Any condition that causes poor circulation to your legs can lead to a venous ulcer.  RISK FACTORS This condition is more likely to develop in:  People who are 65 years of age or older.  People who are overweight.  People who are not active.  People who have had a leg ulcer in the past.  People who have clots in their lower leg veins (deep vein thrombosis).  People who have inflammation of their leg veins (phlebitis).  Women who have given birth.  People who smoke. SYMPTOMS  The most common symptom of this condition is an open sore near your ankle. Other symptoms may include:  Swelling.  Thickening of the skin.  Fluid leaking from the ulcer.  Bleeding.  Itching.  Pain and swelling that gets worse when you stand up and feels better when you raise your leg.  Blotchy skin.  Darkening of the skin. DIAGNOSIS  Your health care provider may suspect a venous ulcer based on your medical history and your risk factors. Your health care provider will check the skin on your legs. Other tests may be done to learn more about the ulcer and to determine the best way to treat it.  Tests that may be done include:  Measuring the blood pressure in your arms and legs.  Using sound waves (ultrasound) to measure the blood flow in your leg veins. TREATMENT You may need to try several different types of treatment to get your venous ulcer to heal. Healing may take a long time. Treatment may include:  Keeping your leg raised (elevated).  Wearing a type of bandage or stocking to compress the veins of your leg (compression therapy). Venous wounds are not likely to heal or to stay healed without compression.  Taking medicines to improve blood flow.  Taking antibiotic medicines to treat infection.  Cleaning your ulcer and removing any dead tissue from the wound (debridement).  Placing various types of medicated bandage (dressings) or medicated wraps on your ulcer. This helps the ulcer to heal and helps to prevent infection. Surgery is sometimes needed to close the wound using a piece of skin taken from another area of your body (graft). You may need surgery if other treatments are not working or if your ulcer is very deep. HOME CARE INSTRUCTIONS Wound Care  Follow instructions from your health care provider about:  How to take care of your wound.  When and how you should change your bandage (dressing).  When you should remove your dressing. If your dressing is dry and sticks to your leg when you try to remove   it, moisten or wet the dressing with saline solution or water so that the dressing can be removed without harming your skin or wound tissue.  Check your wound every day for signs of infection. Have a caregiver do this for you if you are not able to do it yourself. Check for:  More redness, swelling, or pain. More fluid or blood.  Pus, warmth, or a bad smell. Medicines   Take over-the-counter and prescription medicines only as told by your health care provider.  If you were prescribed an antibiotic medicine, take it or apply it as told by your health care  provider. Do not stop taking or using the antibiotic even if your condition improves. Activity  Do not stand or sit in one position for a long period of time. Rest with your legs raised during the day. If possible, keep your legs above your heart for 30 minutes, 3-4 times a day, or as told by your health care provider.  Do not sit with your legs crossed.   Walk often to increase the blood flow in your legs.Ask your health care provider what level of activity is safe for you.  If you are taking a long ride in a car or plane, take a break to walk around at least once every two hours, or as often as your health care provider recommends. Ask your health care provider if you should take aspirin before long trips.  General Instructions  Wear elastic stockings, compression stockings, or support hose as told by your health care provider. This is very important.  Raise the foot of your bed as told by your health care provider.  Do not smoke.  Keep all follow-up visits as told by your health care provider. This is important. SEEK MEDICAL CARE IF:   You have a fever.   Your ulcer is getting larger or is not healing.   Your pain gets worse.   You have more redness or swelling around your ulcer.  You have more fluid, blood, or pus coming from your ulcer after it has been cleaned by you or your health care provider.  You have warmth or a bad smell coming from your ulcer.   This information is not intended to replace advice given to you by your health care provider. Make sure you discuss any questions you have with your health care provider.   Document Released: 02/17/2001 Document Revised: 02/13/2015 Document Reviewed: 10/03/2014 Elsevier Interactive Patient Education 2016 [employer]  

## 2015-05-25 NOTE — ED Notes (Signed)
Patient recounts lengthly history with skin issue in both lower legs. Dressing to lt ankle removed and on the inner aspect of ankle approximately 3 cm length x 2 cm width area present with 30% back/10% yellow/60% redness present. No active drainage present . Pt reports shooting pain . Swelling present to entire foot - able to move toes .

## 2015-05-27 ENCOUNTER — Telehealth: Payer: Self-pay | Admitting: Pulmonary Disease

## 2015-05-27 DIAGNOSIS — R600 Localized edema: Secondary | ICD-10-CM

## 2015-05-27 NOTE — Telephone Encounter (Signed)
Patient states she has a wound on her left foot.  Went to ER on 05/25/15 because wound was red and swelling. Patient was given Clindamycin at the ER and prescription for Hydrocodone for pain.  Still having pain even with pain medication.  She has been keeping it elevated, but even keeping it up all night, her leg is still swollen.  When she takes a step, it feels like it is pulling the wound open, she said there is a shooting pain coming up the back of her leg. Has appointment at wound care on 06/19/15.  Redness is moving up her leg, still swollen.   Allergies  Allergen Reactions  . Nucynta [Tapentadol]     Hives  . Codeine     REACTION: severe nausea by itself  . Penicillins     REACTION: hives all over   [place] [id number] [place]

## 2015-05-27 NOTE — Telephone Encounter (Signed)
Per SN: 06/19/15 appt with wound care is not soon enough, needs referral to Dr. Lajoyce Cornersuda at [facility] Order entered for referral. Patient aware of STAT referral.

## 2015-06-19 ENCOUNTER — Encounter (HOSPITAL_BASED_OUTPATIENT_CLINIC_OR_DEPARTMENT_OTHER): Payer: Commercial Managed Care - HMO | Attending: Surgery

## 2015-06-19 DIAGNOSIS — I83023 Varicose veins of left lower extremity with ulcer of ankle: Secondary | ICD-10-CM | POA: Insufficient documentation

## 2015-06-19 DIAGNOSIS — L97321 Non-pressure chronic ulcer of left ankle limited to breakdown of skin: Secondary | ICD-10-CM | POA: Insufficient documentation

## 2015-06-19 DIAGNOSIS — Z79899 Other long term (current) drug therapy: Secondary | ICD-10-CM | POA: Insufficient documentation

## 2015-06-19 DIAGNOSIS — F988 Other specified behavioral and emotional disorders with onset usually occurring in childhood and adolescence: Secondary | ICD-10-CM | POA: Diagnosis not present

## 2015-06-19 DIAGNOSIS — G43909 Migraine, unspecified, not intractable, without status migrainosus: Secondary | ICD-10-CM | POA: Insufficient documentation

## 2015-06-19 DIAGNOSIS — K219 Gastro-esophageal reflux disease without esophagitis: Secondary | ICD-10-CM | POA: Insufficient documentation

## 2015-06-19 DIAGNOSIS — M1990 Unspecified osteoarthritis, unspecified site: Secondary | ICD-10-CM | POA: Diagnosis not present

## 2015-06-19 DIAGNOSIS — Z87891 Personal history of nicotine dependence: Secondary | ICD-10-CM | POA: Insufficient documentation

## 2015-06-19 DIAGNOSIS — G629 Polyneuropathy, unspecified: Secondary | ICD-10-CM | POA: Diagnosis not present

## 2015-06-19 DIAGNOSIS — E669 Obesity, unspecified: Secondary | ICD-10-CM | POA: Diagnosis not present

## 2015-06-19 DIAGNOSIS — Z6834 Body mass index (BMI) 34.0-34.9, adult: Secondary | ICD-10-CM | POA: Diagnosis not present

## 2015-06-19 DIAGNOSIS — E039 Hypothyroidism, unspecified: Secondary | ICD-10-CM | POA: Diagnosis not present

## 2015-06-20 ENCOUNTER — Ambulatory Visit (AMBULATORY_SURGERY_CENTER): Payer: Self-pay | Admitting: *Deleted

## 2015-06-20 VITALS — Ht 68.5 in | Wt 233.2 lb

## 2015-06-20 DIAGNOSIS — Z1211 Encounter for screening for malignant neoplasm of colon: Secondary | ICD-10-CM

## 2015-06-20 NOTE — Progress Notes (Signed)
No egg or soy allergy known to patient  No issues with past sedation with any surgeries  or procedures, no intubation problems - past hx N/V with early 1980's surgeries , none recent  No diet pills per patient no blood thinners  No home 02 use per patient  Pt has a wound on left leg that is being treated by the wound center and once cleared will have vericose vein surgery to correct- weekly dressing changes per the wound center

## 2015-06-26 DIAGNOSIS — I83023 Varicose veins of left lower extremity with ulcer of ankle: Secondary | ICD-10-CM | POA: Diagnosis not present

## 2015-06-28 ENCOUNTER — Encounter: Payer: Self-pay | Admitting: Pulmonary Disease

## 2015-06-28 ENCOUNTER — Ambulatory Visit (INDEPENDENT_AMBULATORY_CARE_PROVIDER_SITE_OTHER): Payer: Commercial Managed Care - HMO | Admitting: Pulmonary Disease

## 2015-06-28 ENCOUNTER — Encounter: Payer: Self-pay | Admitting: Vascular Surgery

## 2015-06-28 VITALS — BP 146/90 | HR 110 | Temp 98.0°F | Wt 234.6 lb

## 2015-06-28 DIAGNOSIS — I83029 Varicose veins of left lower extremity with ulcer of unspecified site: Secondary | ICD-10-CM | POA: Diagnosis not present

## 2015-06-28 DIAGNOSIS — I872 Venous insufficiency (chronic) (peripheral): Secondary | ICD-10-CM | POA: Diagnosis not present

## 2015-06-28 DIAGNOSIS — G609 Hereditary and idiopathic neuropathy, unspecified: Secondary | ICD-10-CM

## 2015-06-28 DIAGNOSIS — L4050 Arthropathic psoriasis, unspecified: Secondary | ICD-10-CM | POA: Diagnosis not present

## 2015-06-28 DIAGNOSIS — Z96653 Presence of artificial knee joint, bilateral: Secondary | ICD-10-CM

## 2015-06-28 DIAGNOSIS — L97929 Non-pressure chronic ulcer of unspecified part of left lower leg with unspecified severity: Secondary | ICD-10-CM

## 2015-06-28 MED ORDER — TRAMADOL HCL 50 MG PO TABS: 50.0000 mg | ORAL_TABLET | Freq: Three times a day (TID) | ORAL | Status: DC | PRN

## 2015-06-28 NOTE — Patient Instructions (Signed)
Today we updated your med list in our EPIC system...    Continue your current medications the same...  We wrote a new prescription for TRAMADOL  one tab up to 3 times daily as needed for pain...  We will complete the FLMA forms and call you when they are ready to be picked up...  Call for any questions or if we can be of service in any way.Marland KitchenMarland Kitchen

## 2015-06-30 ENCOUNTER — Encounter: Payer: Self-pay | Admitting: Pulmonary Disease

## 2015-06-30 NOTE — Progress Notes (Signed)
Subjective:    Patient ID: Sarah Horton, female    DOB: 1951/01/17, 65 y.o.   MRN: [id number]  HPI 65 y/o WF here for a follow up visit... she has multiple medical problems as noted below...   SEE PREV EPIC NOTES FOR OLDER DATA >>    LABS 4/13 from [name]> FLP- at goals on diet alone;  BMet- wnl w/ BS=84 A1c=5.4 & Creat=1.1;  TSH=1.98   ~  December 14, 2012:  Yearly ROV & Sarah Horton describes a busy yr, lots going on, just can't seem to lose weight;  We reviewed the following medical problems during today's office visit >>     AR, AB> on Nasonex, Astelin, Xyzal5 as needed; followed by DrSharma for allergy- prev on shots but she stopped on her own, may need retesting...    Ven Insuffic> on low sodium, elevation, support hose; not requiring diuretics & no signif edema...    Hypothy> followed by [name] on Synthroid50; we do not have notes or labs but pt assures me they are ok...    Obesity> wt= 238# & unable to lose; we reviewed diet + exercise; asked to discuss this w/ her Endocrinologist...    GI- GERD, Divertics, IBS> on Prilosec20 & Phenergan 25mg  for nausea w/ migraines; last colon 2006 by [name] w/o polyps found...    DJD & Psoriatic arthritis> followed by [name] on Enbrel, & Robaxin500Bid prn; last seen 2/14 & note reviewed- hx psoriatic arthritis c/o pain in feet, LBP, tol Enbrel well- labs (done every 74mo) reported all nromal; they rec [facility]...    Hx migraines> on Imitrex nasal spray per [name] (likes the spray & wants tabs too); when last seen 2013 they tried shots in scalp for migraines- no benefit; states HAs diminished off Premarin...    Hx periph neuropathy> she [place]iatrist in [place] "it's like walking on rocks" & she is rec to try non-weight-bearing exercises...    Anxiety, ?ADD> she is on Adderall-XR30mg /d [facility]; they requested EKG here prior to Rx- this was done 10/13 w/ NSR, rate66, poor R prog V1-3, otherw wnl... We  reviewed prob list, meds, xrays and labs> see below for updates >> she requests handicap sticker...  EKG 10/13 by TP showed NSR, rate66, poor R progression  LABS:  Last labs in Epic 1/12; she tells me that all labs done now by [name] at [facility] & [name] (asked to get copies to [place] to scan into EPIC)...   ~  June 06, 2013:  21mo ROV & add-on appt requested for sore over left medial malleolus; some discomfort, some min drainage, not red/ inflammed, no f/c/s, notes it's slow to heal; she [facility] & they suggested f/u here... She has VV/ VI stasis changes, notes a scratch on left ankle area above medial malleolus w/ broken skin that started this whole thing, she wears TED hose daily;  Exam shows VV/VI, dependent rubor, onychomycosis;  She has seen Ortho- DrHewitt in the past regarding her right foot & fx toe w/ 2nd toe overriding the 3rd... We decided to treat w/ gentle cleansing 1-2x per day, cover w/ silvadene cream & no-stick pad, plus her support hose... She is to call if not improving for wound care clinic referral...    She sees DrVanWinkle for her allergies> cough, reflux related and Rx helps; Qnasl is helping her sinuses she says...     She has DJD & psoriatic arthritis followed regularly by [name]  on Enbrel w/ labs done via her office every 73mo also sees practitioners at [facility]..Marland KitchenMarland Kitchen   ?peripheral neuropathy prev evaluated in [place] and we don't have records We reviewed prob list, meds, xrays and labs> see below for updates >> she had the 2014 Flu vaccine 10/14...   LABS 12/14:  Chems- wnl;  CBC- wnl w/ WBC=8.2;  TSH=4.17  ~  December 13, 2013:  669moOV & Sarah Horton indicating that she is under considerable stress w/ husb illness (DM, ITP, FLD=>cirrhosis, DDD in neck);  She continues to get her regular medical care from her specialists that she sees w/ some regularity>>    DrWolicki (ENT- seen 4/4/85note reviewed) & VanWinkle (Allergy) for AR,  sinusitis; she had ENT eval & CT Sinus 4/15 showing post surg changes, no active sinus dis/ mucosal thickening/ or obstruction; he suspected noct reflux- Rx Prilosec, antireflux regimen, Dymista;  She notes that she can feel her sinuses draining & doubts this is reflux since she often hold her head down over the bed which stops the trickle & helps her cough;  Exam is neg, lungs are clear;  CXR today shows norm heart size, clear lungs, NAD (remote healed left 6th rib fx)... We reviewed effective antireflux measures...     [name] (Endocrine- we do not have recent notes from her) for Hypothyroid, Obesity (wt up 4# to 251#, BMI=37-8), Lipid checks; pt indicates that Labs 4/15 showed TSH= 2.38 and A1c= 5.1; she will get FLP done, we reviewed diet recs...    DrDeveschwar (Rheum- we do not have recent notes from her) follows her Psoriatic arthritis & DJD on Enbrel & Robaxin; she's had bilat TKRs from [name]; pt brought labs from 5/15> Chems- wnl;  CBC- ok w/ Hg=12.3 (MCV=81);  Quantiferon Gold= NEG...     She also has VenInsuffic & hx stasis ulcer on left medial malleolus area treated w/ cleansing, Silvadene cream, dressings, and eventually resolved; more recent right foot big toe fx, hx hammer toes w/ prev surg & followed by Podiatry...  We reviewed prob list, meds, xrays and labs> see below for updates >>   CT Sinuses 09/2013 by ENT showed no active sinus dis...   [facility]...  CXR 7/15 showed norm heart size, clear lungs, NAD (remote healed left 6th rib fx)..  ~  February 07, 2015:  1379moV & add-on appt requested by pt after a fall at home on sidewalk ~3wks ago; she bruised her right side but didn't break the skin; several days later she noted an ulcer at her right medial malleolus assoc w/ some redness/ drainage of clear fluid/ etc;  She has been treating herself w/ Duoderm, then recently switched to Silvadene+dressing but noted some pain in the area;  She called her Ortho-  [name] who did her bilat TKRs but he couldn't see her til Oct, and similar after checking w/ her rheum- [name] (on Enbrel for psoriatic arthritis);  She then called here for add-on appt to be checked...    As noted above she has a long list of medical specialists tending to her medical needs>  IMP/PLAN>>  We decided to treat w/ Keflex50Tis x7d and Vicodin prn pain;  She will clean once or twice daily, cover w/ Silvadene cream/ non-stick dressing/ paper tape;  She will f/u w/ her Orthopedist & rheumatologist;  Offered referral to wound clinic if necessary...  ADDENDUM>>  Venous Dopplers 03/08/15 by [facility] showed no evid of DVT or superficial vein thrombosis,  vein incompetence in bilat GSV & common femoral veins....  ~  June 28, 2015:  43mo ROV & Sarah Horton is here to have FLMA forms completed for time off work;  She needs a LOA from work for several months due to on-going difficulty w/ recurrent leg wound care, Rheumatology management of her psoriatic arthritis & DJD, and vascular surgery by [facility] due to chronic venous dis in her LEs...  We reviewed the following medical problems during today's office visit >>     AR, AB> on Nasalide, Zyrtek10, Singulair10- followed by DrVanWinkle etal for allergy- prev on shots but she stopped on her own, may need retesting; lastseen 12/16- note reviewed...    Ven Insuffic, LEG WOUNDS- stasis ulcers on legs> on low sodium, elevation, support hose; not requiring diuretics & no signif edema; she has chr ven insuffic w/ eval by [facility]- DrChen/ DrLawson w/ GSV surgery planned; she is followed in the wound care clinic (seen ER 05/25/15) w/ on-going treatment w/ Silvadene & Sorbigel...    Hypothy> followed by [name] on Synthroid50- taking 1.5 tabs/d; we do not have notes or labs but pt assures me they are ok!    Obesity> wt= 235# & unable to lose; we reviewed diet + exercise; asked to discuss this w/ her Endocrinologist, [name]...    GI- GERD, Divertics, IBS> on Pepcid20,  Miralax, & Phenergan 25mg  for nausea w/ migraines; last colon 2006 by [name] w/o polyps found & she is due for f/u screening procedure...    DJD & Psoriatic arthritis> followed by [name]- prev on Enbrel (off x months due to legulcers), on Robaxin500Bid prn; hx psoriatic arthritis c/o pain in feet, LBP, etc; she has seen [facility]; pt tells me they plan to restart MTX before Enbrel again after her vein surg (we do not have recent notes from Rheum)...    Hx migraines> on Imitrex nasal spray per [name] (likes the spray & wants tabs too); when last seen 2013 they tried shots in scalp for migraines- no benefit; states HAs diminished off Premarin...    Hx periph neuropathy> [place]iat[place] "it's like walking on rocks" & she is rec to try non-weight-bearing exercises...    Anxiety, ?ADD> she is on Adderall-XR30mg /d [facility]; they requested EKG here prior to Rx- this was done 10/13 w/ NSR, rate66, poor R prog V1-3, otherw wnl... EXAM shows Afeb, VSS,O2sat=96% on RA;  HEENT- neg, mallampati2;  Chest- clear w/o w/r/r;  Heart- RR w/o m/r/g;  Abd- obese, soft, nontender;  Ext- VI, leg ulcer/ dressing left leg w/ edema...  XRays of LEs> no osseous abn, no osteomyelitis, +vasc calcif in soft tissues, edema...   LABS from 02/2015- [name] brought by pt & reviewed...  IMP/PLAN>>  The pt brough FLMA forms from both her insurance company & her employer- [employer]: theses were completed and retruned to the pt to distribute;  Rxfor TRAMADOL 50mg  1 Tid prn pain was written today;  She is rec to continue regular f/u w/ the wound center, [facility], Rheum, Endocrine, etc;  shewill f/u w/ me in 60mo & sooner if needed...          Problem List:  ALLERGIC RHINITIS (ICD-477.9) - on XYZAL 5mg /d,  NASONEX Qhs,  ASTELIN Prn... +allergy testing in past- trees & grasses mostly... ~  She is [facility]... ~  She is followed by  DrWolicki for ENT w/ CT Sinuses 4/15 that was NEG...  BRONCHITIS,  RECURRENT (ICD-491.9) - she's been doing well overall without recent infections etc... ~  CXR 11/11 showed normal heart size, clear lungs, healing fx left 6th rib posteriorly... ~  CXR 7/15 showed norm heart size, clear lungs, NAD (remote healed left 6th rib fx)  VENOUS INSUFFICIENCY (ICD-459.81) - she follows a low salt diet, elevates legs, and wears support hose Prn... she's had normal ArtDopplers and neg VenDopplers in the last 77yrs... ~  7/13 & 7/14:  She notes trophic changes & discoloration of the skin, no edema however... ~  12/14:  She presented w/ a sore over the left medial malleolus slow to heal after a scratch; treated w/ cleaning bid, cover w/ Silvadene cream, no-stick pad and support hose... ~  5-7/15:  She had right great toe fx, seen by Podiatry & f/u wound care clinic... ~  9/16:  She presented afte fall w/ ulcer developing at medial malleolus of right leg- advised cleansing daily/ Silvadene cream/ Telfa dressing w/ paper tape/ elevate etc; Rx Keflex & Vicodin & refer to wound clinic if not responding... ~  1/17:  She now has ulcer on left leg-still followed in the wound care clinic & by [facility]-DrChen/Lawson...  HYPOTHYROIDISM, BORDERLINE (ICD-244.9) - followed by [name] on LEVOTHYROID 66mcg/d;  Pt states tha she checks pt once per yr and does her thyroid labs... ~  Labs 4/13 by [name] showed TSH= 1.98 ~  She continues to f/u w/ Endocrine, [name] w/ labs done in her office...  OBESITY (ICD-278.00) - she is 5\' 7"  tall and BMI at 35-38 range... ~  weight 10/10 = 236# ~  weight 8/11 = 255# (BMI= 37-38) ~  Weight 6/12 = 211# (BMI= 33) ~  Weight 7/13 = 240# and she notes not exercising due to work & back... ~  Weight 7/14 = 238# ~  Weight 12/14 = 248# ~  Weight 7/15 = 251# and we reviewed diet, exercise, wt reduction strategies... ~  Weight 9/16 = 241# ~  Weight 1/17 = 235#  GERD (ICD-530.81) - she uses OTC  Pepcid as needed... IRRITABLE BOWEL SYNDROME (ICD-564.1) - last colonoscopy 3/06 by [name] showed divertics, hems, prob IBS... f/u planned 54yrs.  DEGENERATIVE JOINT DISEASE (ICD-715.90) - she has osteoarthritis and takes TYLENOL as needed (stopped Tramadol due to "dizzy & sick"). ~  11/11: she had bilat TKRs done by [name]> off work x 42mo, lots of PT, & finally improved.  PSORIATIC ARTHROPATHY (ICD-696.0) - on ENBREL 50mg /wk, (off prev MTX since her knee surg 11/11)... followed by [name] locally and prev [name] [facility]... also sees [name] for [employer]... ~  1/17:  She continues regular f/u & management by [name]- we do not have recent notes- pt informs me that her Enbrel is on hold due to leg wounds...  MIGRAINES, HX OF (ICD-V13.8) - on IMITREX Prn, PHENERGAN Prn... she is [name] [facility] & used to take Atenolol 25mg /d for prevention but she notes much improved now (since off Premarin), est only 2-3 HAs per yr now & doesn't need the BBlocker she says...  PERIPHERAL NEUROPATHY (ICD-356.9) - she tried Neurontin but was INTOL "it made me stupid"...  ANXIETY (ICD-300.00) >> Adult ADD  ~  She's had eval at [facility]- they wrote for [facility] & she feels improved...  Health Maintenance - GYN= [name]'s office... prev on Premarin, off now w/ hot flashes controlled after consult w/ [name] w/ CLONIDINE 0.1mg /d... Mammograms at [facility]... prev BMD w/ osteopenia per pt hx- on  Calcium, Vits, Vit D OTC... rec to take ASA /d... FLP's in past w/ borderline LDL~ 110 range- on diet Rx alone... ~  Labs 4/13 by [name] showed TChol 168, TG 108, HDL 82, LDL 64 ~  Immuniz> She had 2014 Flu vaccine 10/14;  Given TDAP 10/13;    Past Surgical History  Procedure Laterality Date  . Cataract extraction, bilateral      [id number]  . Nasal sinus surgery    . Abdominal hysterectomy    . Shoulder surgery  2005  . Bilat tkrs  04/2010    by  [name]  . Joint replacement Bilateral     knees  . Bunionectomy  2009  . Dequervains tensenovitis Left   . Colonoscopy  08-26-2004    with gessner tics and hems   . Ablation on endometriosis      x2    Outpatient Encounter Prescriptions as of 06/28/2015  Medication Sig  . amphetamine-dextroamphetamine (ADDERALL XR) 30 MG 24 hr capsule Take 1 tablet by mouth every other day.   . calcium carbonate (OS-CAL) 600 MG TABS Take 600 mg by mouth 2 (two) times daily with a meal.    . cetirizine (ZYRTEC) 10 MG tablet Take 10 mg by mouth daily.  . famotidine (PEPCID) 10 MG tablet Take 20 mg by mouth daily.   Marland Kitchen lactobacillus acidophilus (BACID) TABS tablet Take 1 tablet by mouth daily.  Marland Kitchen levothyroxine (SYNTHROID, LEVOTHROID) 50 MCG tablet Take 75 mcg by mouth daily.   Marland Kitchen MAGNESIUM PO Take 750 mg by mouth daily.  . methocarbamol (ROBAXIN) 500 MG tablet 1-2 tablets by mouth daily as needed   . montelukast (SINGULAIR) 10 MG tablet Take 10 mg by mouth at bedtime.   . Multiple Vitamin (MULTIVITAMIN) tablet Take 1 tablet by mouth daily.    . Naproxen Sodium (ALEVE PO) Take 2 tablets by mouth 2 (two) times daily before a meal.  . promethazine (PHENERGAN) 25 MG tablet TAKE 1 TABLET (25 MG TOTAL) BY MOUTH EVERY 6 (SIX) HOURS AS NEEDED FOR NAUSEA.  . SUMAtriptan (IMITREX) 20 MG/ACT nasal spray Place 1 spray (20 mg total) into the nose every 2 (two) hours as needed.  . bisacodyl (DULCOLAX) 5 MG EC tablet Take 5 mg by mouth once. Reported on 06/28/2015  . etanercept (ENBREL) 50 MG/ML injection Inject 50 mg into the skin once a week. Reported on 06/28/2015  . polyethylene glycol powder (MIRALAX) powder Take 1 Container by mouth once. Reported on 06/28/2015  . traMADol (ULTRAM) 50 MG tablet Take 1 tablet (50 mg total) by mouth 3 (three) times daily as needed.  . [DISCONTINUED] cephALEXin (KEFLEX) 500 MG capsule Take 1 capsule (500 mg total) by mouth 3 (three) times daily. (Patient not taking: Reported on 04/05/2015)   . [DISCONTINUED] flunisolide (NASALIDE) 25 MCG/ACT (0.025%) SOLN Reported on 06/28/2015  . [DISCONTINUED] silver sulfADIAZINE (SILVADENE) 1 % cream Use as directed (Patient not taking: Reported on 06/20/2015)   No facility-administered encounter medications on file as of 06/28/2015.    Allergies  Allergen Reactions  . Nucynta [Tapentadol]     Hives  . Codeine     REACTION: severe nausea by itself  . Penicillins     REACTION: hives all over    Review of Systems         See HPI - all other systems neg except as noted...  The patient complains of weight gain, dyspnea on exertion, and difficulty walking.  The patient denies anorexia, fever, weight loss, vision  loss, decreased hearing, hoarseness, chest pain, syncope, peripheral edema, prolonged cough, headaches, hemoptysis, abdominal pain, melena, hematochezia, severe indigestion/heartburn, hematuria, incontinence, muscle weakness, suspicious skin lesions, transient blindness, depression, unusual weight change, abnormal bleeding, enlarged lymph nodes, and angioedema.     Objective:   Physical Exam     WD, Overweight, 65 y/o WF in NAD... GENERAL:  Alert & oriented; pleasant & cooperative... HEENT:  [place]/AT, EOM-wnl, PERRLA, EACs-clear, TMs-wnl, NOSE-clear, THROAT-clear & wnl. NECK:  Supple w/ fairROM; no JVD; normal carotid impulses w/o bruits; no thyromegaly or nodules palpated; no lymphadenopathy. CHEST:  Clear to P & A; without wheezes/ rales/ or rhonchi. HEART:  Regular Rhythm; without murmurs/ rubs/ or gallops. ABDOMEN:  Obese, soft & nontender; normal bowel sounds; no organomegaly or masses detected. EXT:  severe arthritic changes in knees; +varicose veins/ +venous insuffic/ tr edema/ & open area on left medial malleolus now... NEURO:  CN's intact; motor testing normal; sensory testing normal; gait normal & balance OK. DERM:  few min psoriatic patches, dependent rubor in legs, overriding right 2nd toe, onychomycosis...   RADIOLOGY  DATA:  Reviewed in the EPIC EMR & discussed w/ the patient...  LABORATORY DATA:  Reviewed in the EPIC EMR & discussed w/ the patient...   Assessment & Plan:    Venous stasis ulcer right medial malleolus area>> needs local therapy & wound care=> Rx w/ Keflex, Vicodin, Silvadene/ dressings/ etc; prev hammer toe surg, etc=> improved 1/17>  She now has lesion on left leg- wound care clinic, [facility], Rheum/ Derm...   AR/ Bronchitis>  Stable on antihist, nasal steroid, etc; she c/o sinus drainage, cough, etc eval by ENT & Allergy teams...  Ven Insuffic>  She did beautifully after her knee surg, no DVT, swelling, etc; reminded to elev legs, wear support hose, etc... 1/17>  She tells me thatDrLawson is planning GSV surgery...  HYPOTHYROID>  Followed by [name] on Synthroid50 & she does her labs...  OBESITY>  Fabulous job w/ wt down 47# to 211 in 2012, but unfortunately gained up to 250+# in 2013 & beyond...  GI>  GERD/ IBS>  follwed by [name], up to date, & stable...  DJD/ PSORIATIC Arthritis>  follwed by [name] & [name] on ENBREL Rx...  Migraine HA>  Hx migraines but stable & quiescent; followed by [name]- she request refill IMITREX nasal sp...  Other medical problems as noted>>> pt will get labs at [facility] w/ copy to me...   Patient's Medications  New Prescriptions   TRAMADOL (ULTRAM) 50 MG TABLET    Take 1 tablet (50 mg total) by mouth 3 (three) times daily as needed.  Previous Medications   AMPHETAMINE-DEXTROAMPHETAMINE (ADDERALL XR) 30 MG 24 HR CAPSULE    Take 1 tablet by mouth every other day.    BISACODYL (DULCOLAX) 5 MG EC TABLET    Take 5 mg by mouth once. Reported on 06/28/2015   CALCIUM CARBONATE (OS-CAL) 600 MG TABS    Take 600 mg by mouth 2 (two) times daily with a meal.     CETIRIZINE (ZYRTEC) 10 MG TABLET    Take 10 mg by mouth daily.   ETANERCEPT (ENBREL) 50 MG/ML INJECTION    Inject 50 mg into the skin once a week. Reported on 06/28/2015   FAMOTIDINE  (PEPCID) 10 MG TABLET    Take 20 mg by mouth daily.    LACTOBACILLUS ACIDOPHILUS (BACID) TABS TABLET    Take 1 tablet by mouth daily.   LEVOTHYROXINE (SYNTHROID, LEVOTHROID) 50 MCG TABLET  Take 75 mcg by mouth daily.    MAGNESIUM PO    Take 750 mg by mouth daily.   METHOCARBAMOL (ROBAXIN) 500 MG TABLET    1-2 tablets by mouth daily as needed    MONTELUKAST (SINGULAIR) 10 MG TABLET    Take 10 mg by mouth at bedtime.    MULTIPLE VITAMIN (MULTIVITAMIN) TABLET    Take 1 tablet by mouth daily.     NAPROXEN SODIUM (ALEVE PO)    Take 2 tablets by mouth 2 (two) times daily before a meal.   POLYETHYLENE GLYCOL POWDER (MIRALAX) POWDER    Take 1 Container by mouth once. Reported on 06/28/2015   PROMETHAZINE (PHENERGAN) 25 MG TABLET    TAKE 1 TABLET (25 MG TOTAL) BY MOUTH EVERY 6 (SIX) HOURS AS NEEDED FOR NAUSEA.   SUMATRIPTAN (IMITREX) 20 MG/ACT NASAL SPRAY    Place 1 spray (20 mg total) into the nose every 2 (two) hours as needed.  Modified Medications   No medications on file  Discontinued Medications   CEPHALEXIN (KEFLEX) 500 MG CAPSULE    Take 1 capsule (500 mg total) by mouth 3 (three) times daily.   FLUNISOLIDE (NASALIDE) 25 MCG/ACT (0.025%) SOLN    Reported on 06/28/2015   SILVER SULFADIAZINE (SILVADENE) 1 % CREAM    Use as directed

## 2015-07-01 ENCOUNTER — Encounter: Payer: Self-pay | Admitting: Vascular Surgery

## 2015-07-03 DIAGNOSIS — I83023 Varicose veins of left lower extremity with ulcer of ankle: Secondary | ICD-10-CM | POA: Diagnosis not present

## 2015-07-04 ENCOUNTER — Ambulatory Visit (AMBULATORY_SURGERY_CENTER): Payer: Commercial Managed Care - HMO | Admitting: Internal Medicine

## 2015-07-04 ENCOUNTER — Encounter: Payer: Self-pay | Admitting: Internal Medicine

## 2015-07-04 VITALS — BP 129/71 | HR 76 | Temp 98.2°F | Resp 21 | Ht 68.0 in | Wt 233.0 lb

## 2015-07-04 DIAGNOSIS — Z1211 Encounter for screening for malignant neoplasm of colon: Secondary | ICD-10-CM

## 2015-07-04 MED ORDER — SODIUM CHLORIDE 0.9 % IV SOLN: 500.0000 mL | INTRAVENOUS | Status: DC

## 2015-07-04 NOTE — Patient Instructions (Addendum)
No polyps or cancer!  Next routine colonoscopy in 10 years - 2027  I appreciate the opportunity to care for you. Iva Boop, MD, FACG      YOU HAD AN ENDOSCOPIC PROCEDURE TODAY AT THE [facility]:   Refer to the procedure report that was given to you for any specific questions about what was found during the examination.  If the procedure report does not answer your questions, please call your gastroenterologist to clarify.  If you requested that your care partner not be given the details of your procedure findings, then the procedure report has been included in a sealed envelope for you to review at your convenience later.  YOU SHOULD EXPECT: Some feelings of bloating in the abdomen. Passage of more gas than usual.  Walking can help get rid of the air that was put into your GI tract during the procedure and reduce the bloating. If you had a lower endoscopy (such as a colonoscopy or flexible sigmoidoscopy) you may notice spotting of blood in your stool or on the toilet paper. If you underwent a bowel prep for your procedure, you may not have a normal bowel movement for a few days.  Please Note:  You might notice some irritation and congestion in your nose or some drainage.  This is from the oxygen used during your procedure.  There is no need for concern and it should clear up in a day or so.  SYMPTOMS TO REPORT IMMEDIATELY:   Following lower endoscopy (colonoscopy or flexible sigmoidoscopy):  Excessive amounts of blood in the stool  Significant tenderness or worsening of abdominal pains  Swelling of the abdomen that is new, acute  Fever of 100F or higher   For urgent or emergent issues, a gastroenterologist can be reached at any hour by calling [phone].   DIET: Your first meal following the procedure should be a small meal and then it is ok to progress to your normal diet. Heavy or fried foods are harder to digest and may make you feel nauseous or  bloated.  Likewise, meals heavy in dairy and vegetables can increase bloating.  Drink plenty of fluids but you should avoid alcoholic beverages for 24 hours.  ACTIVITY:  You should plan to take it easy for the rest of today and you should NOT DRIVE or use heavy machinery until tomorrow (because of the sedation medicines used during the test).    FOLLOW UP: Our staff will call the number listed on your records the next business day following your procedure to check on you and address any questions or concerns that you may have regarding the information given to you following your procedure. If we do not reach you, we will leave a message.  However, if you are feeling well and you are not experiencing any problems, there is no need to return our call.  We will assume that you have returned to your regular daily activities without incident.  If any biopsies were taken you will be contacted by phone or by letter within the next 1-3 weeks.  Please call us at [phone] if you have not heard about the biopsies in 3 weeks.    SIGNATURES/CONFIDENTIALITY: You and/or your care partner have signed paperwork which will be entered into your electronic medical record.  These signatures attest to the fact that that the information above on your After Visit Summary has been reviewed and is understood.  Full responsibility of the confidentiality of this  discharge information lies with you and/or your care-partner.    Handout was given to your care partner on diverticulosis. You may resume your current medications today. Please call if any questions or concerns.

## 2015-07-04 NOTE — Progress Notes (Signed)
Patient awakening,vss,report to rn 

## 2015-07-04 NOTE — Op Note (Signed)
[facility] [address] [place], [address]   COLONOSCOPY PROCEDURE REPORT  PATIENT: Triana, Coover  MR#: [id number] BIRTHDATE: [birth year] , 64  yrs. old GENDER: female ENDOSCOPIST: Iva Boop, MD, [facility] PROCEDURE DATE:  07/04/2015 PROCEDURE:   Colonoscopy, screening First Screening Colonoscopy - Avg.  risk and is 50 yrs.  old or older - No.  Prior Negative Screening - Now for repeat screening. N/A  History of Adenoma - Now for follow-up colonoscopy & has been > or = to 3 yrs.  N/A  Polyps removed today? No Recommend repeat exam, <10 yrs? No ASA CLASS:   Class II INDICATIONS:Screening for colonic neoplasia and Colorectal Neoplasm Risk Assessment for this procedure is average risk. MEDICATIONS: Propofol 250 mg IV and Monitored anesthesia care  DESCRIPTION OF PROCEDURE:   After the risks benefits and alternatives of the procedure were thoroughly explained, informed consent was obtained.  The digital rectal exam revealed no abnormalities of the rectum.   The LB [id number]-[id number]  endoscope was introduced through the anus and advanced to the cecum, which was identified by both the appendix and ileocecal valve. No adverse events experienced.   The quality of the prep was good.  (MiraLax was used)  The instrument was then slowly withdrawn as the colon was fully examined. Estimated blood loss is zero unless otherwise noted in this procedure report.      COLON FINDINGS: There was mild diverticulosis noted in the left colon.   The examination was otherwise normal.  Retroflexed views revealed no abnormalities. The time to cecum = 2.2 Withdrawal time = 10.9   The scope was withdrawn and the procedure completed. COMPLICATIONS: There were no immediate complications.  ENDOSCOPIC IMPRESSION: 1.   Mild diverticulosis was noted in the left colon 2.   The examination was otherwise normal - good prep  RECOMMENDATIONS: Repeat colonoscopy 10 years.  eSigned:   Iva Boop, MD, [facility] 07/04/2015 2:07 PM   cc: The Patient

## 2015-07-04 NOTE — Progress Notes (Signed)
No complaints noted in the recovery room. maw 

## 2015-07-05 ENCOUNTER — Telehealth: Payer: Self-pay | Admitting: *Deleted

## 2015-07-05 NOTE — Telephone Encounter (Signed)
No answer, left message to call if questions or concerns. 

## 2015-07-09 ENCOUNTER — Encounter: Payer: Self-pay | Admitting: Vascular Surgery

## 2015-07-09 ENCOUNTER — Ambulatory Visit (INDEPENDENT_AMBULATORY_CARE_PROVIDER_SITE_OTHER): Payer: Commercial Managed Care - HMO | Admitting: Vascular Surgery

## 2015-07-09 VITALS — BP 124/83 | HR 102 | Temp 97.1°F | Resp 16 | Ht 68.5 in | Wt 230.0 lb

## 2015-07-09 DIAGNOSIS — I83893 Varicose veins of bilateral lower extremities with other complications: Secondary | ICD-10-CM | POA: Diagnosis not present

## 2015-07-09 NOTE — Progress Notes (Signed)
Subjective:     Patient ID: Sarah Horton, female   DOB: Jun 15, 1950, 65 y.o.   MRN: [id number]  HPI This 65 year old female returns for continued follow-up of her bilateral venous stasis ulcers. She currently has an active ulcer in the left ankle which is being treated the wound center. She had an ulcer on the right ankle from August and through October 2016 which eventually healed. She has chronic edema as well as aching burning and throbbing discomfort of both legs. She has documented gross reflux in the great saphenous systems bilaterally in the small saphenous system on the right.  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 She does go to the wound center once a week for dressing changes for the ulceration she has no history of DVT thrombophlebitis or bleeding.   Past Medical History  Diagnosis Date  . Allergic rhinitis, cause unspecified   . Unspecified chronic bronchitis (HCC)   . Unspecified venous (peripheral) insufficiency   . Unspecified hypothyroidism   . Obesity, unspecified   . Esophageal reflux   . Irritable bowel syndrome   . Psoriatic arthropathy (HCC)   . Unspecified hereditary and idiopathic peripheral neuropathy   . Anxiety state, unspecified   . Anemia, unspecified   . Allergy   . Arthritis   . Cataract     bilaterally removed 2003,2007    Social History  Substance Use Topics  . Smoking status: Former Smoker -- 1.50 packs/day for 22 years    Types: Cigarettes    Quit date: 06/08/1992  . Smokeless tobacco: Never Used  . Alcohol Use: 0.0 oz/week    0 Standard drinks or equivalent per week     Comment: occasional wine    Family History  Problem Relation Age of Onset  . Cirrhosis Father   . COPD Mother   . Heart disease Mother   . Hypertension Mother   . Varicose Veins Mother   . Colon cancer Neg Hx   . Colon polyps Neg Hx   . Esophageal cancer Neg Hx   . Rectal cancer Neg Hx   . Stomach cancer Neg  Hx   . Breast cancer Maternal Grandmother   . Breast cancer Other     1st cousin   . Pancreatic cancer Other     1st cousin     Allergies  Allergen Reactions  . Nucynta [Tapentadol]     Hives  . Codeine     REACTION: severe nausea by itself  . Penicillins     REACTION: hives all over     Current outpatient prescriptions:  .  calcium carbonate (OS-CAL) 600 MG TABS, Take 600 mg by mouth 2 (two) times daily with a meal.  , Disp: , Rfl:  .  cetirizine (ZYRTEC) 10 MG tablet, Take 10 mg by mouth daily., Disp: , Rfl:  .  etanercept (ENBREL) 50 MG/ML injection, Inject 50 mg into the skin once a week. Reported on 06/28/2015, Disp: , Rfl:  .  famotidine (PEPCID) 10 MG tablet, Take 20 mg by mouth daily. , Disp: , Rfl:  .  lactobacillus acidophilus (BACID) TABS tablet, Take 1 tablet by mouth daily., Disp: , Rfl:  .  levothyroxine (SYNTHROID, LEVOTHROID) 50 MCG tablet, Take 75 mcg by mouth daily. , Disp: , Rfl:  .  MAGNESIUM PO, Take 750 mg by mouth daily., Disp: , Rfl:  .  methocarbamol (ROBAXIN) 500 MG tablet, 1-2 tablets by mouth  daily as needed , Disp: , Rfl:  .  montelukast (SINGULAIR) 10 MG tablet, Take 10 mg by mouth at bedtime. , Disp: , Rfl:  .  Multiple Vitamin (MULTIVITAMIN) tablet, Take 1 tablet by mouth daily.  , Disp: , Rfl:  .  Naproxen Sodium (ALEVE PO), Take 2 tablets by mouth 2 (two) times daily before a meal., Disp: , Rfl:  .  promethazine (PHENERGAN) 25 MG tablet, TAKE 1 TABLET (25 MG TOTAL) BY MOUTH EVERY 6 (SIX) HOURS AS NEEDED FOR NAUSEA., Disp: 30 tablet, Rfl: 0 .  SUMAtriptan (IMITREX) 20 MG/ACT nasal spray, Place 1 spray (20 mg total) into the nose every 2 (two) hours as needed., Disp: 1 Inhaler, Rfl: 5 .  traMADol (ULTRAM) 50 MG tablet, Take 1 tablet (50 mg total) by mouth 3 (three) times daily as needed., Disp: 90 tablet, Rfl: 0 .  amphetamine-dextroamphetamine (ADDERALL XR) 30 MG 24 hr capsule, Take 1 tablet by mouth every other day. Reported on 07/09/2015, Disp: ,  Rfl:   Filed Vitals:   07/09/15 1526  BP: 124/83  Pulse: 102  Temp: 97.1 F (36.2 C)  TempSrc: Oral  Resp: 16  Height: 5' 8.5" (1.74 m)  Weight: 230 lb (104.327 kg)  SpO2: 95%    Body mass index is 34.46 kg/(m^2).           Review of Systems Has peripheral neuropathy, psoriatic arthritis , denies chest pain, dyspnea on exertion, PND, rthopnea.     Objective:   Physical Exam BP 124/83 mmHg  Pulse 102  Temp(Src) 97.1 F (36.2 C) (Oral)  Resp 16  Ht 5' 8.5" (1.74 m)  Wt 230 lb (104.327 kg)  BMI 34.46 kg/m2  SpO2 95%   general obese female no apparent distress alert and oriented 3 Lungs no rhonchi or wheezing  left leg with active ulceration over medial malleolus with Unna boot dressing in place from the wound center. Patient did bring pictures of the ulceration taken at past dressing change. 3+ dorsalis pedis pulse palpable. Bulging varicosities distal thigh on the left. Right leg with chronic 1+ edema. Area of healed ulceration near medial malleolus with hyperpigmentation lower third of leg. Bulging varicosities right posterior calf.   I reviewed the formal venous duplex exam from last visit which reveals gross reflux throughout a left great saphenous vein which is large caliber with no DVT There is gross reflux throughout a right small saphenous vein which is also  Large caliber supplying these painful varicosities bilaterally   None verified these findings with the bedside sono site ultrasound exam.     Assessment:      bilateral venous stasis ulcers 1 active on the left and one recently healed on the right due to gross reflux right small saphenous vein and gross reflux left great saphenous vein. These did not respond completely to conservative measures including long-leg elastic compression stockings , the wound center, elevation, and ibuprofen.     Plan:      patient needs number-one laser ablation left great saphenous vein +10-20 stab phlebectomy of  painful varicosities followed by #2 laser ablation right small saphenous vein +10-20 stab phlebectomy of painful varicosities. Hopefully this will allow healing of  Stasis ulcer left leg and prevention of further ulcers on the right leg as well as remote relieving some of her distal edema and pain. We will proceed with precertification form this near future

## 2015-07-10 ENCOUNTER — Encounter (HOSPITAL_BASED_OUTPATIENT_CLINIC_OR_DEPARTMENT_OTHER): Payer: Commercial Managed Care - HMO | Attending: Surgery

## 2015-07-10 DIAGNOSIS — M1990 Unspecified osteoarthritis, unspecified site: Secondary | ICD-10-CM | POA: Insufficient documentation

## 2015-07-10 DIAGNOSIS — I87312 Chronic venous hypertension (idiopathic) with ulcer of left lower extremity: Secondary | ICD-10-CM | POA: Insufficient documentation

## 2015-07-10 DIAGNOSIS — L97821 Non-pressure chronic ulcer of other part of left lower leg limited to breakdown of skin: Secondary | ICD-10-CM | POA: Insufficient documentation

## 2015-07-17 ENCOUNTER — Encounter (HOSPITAL_BASED_OUTPATIENT_CLINIC_OR_DEPARTMENT_OTHER): Payer: Commercial Managed Care - HMO

## 2015-07-17 DIAGNOSIS — I87312 Chronic venous hypertension (idiopathic) with ulcer of left lower extremity: Secondary | ICD-10-CM | POA: Diagnosis not present

## 2015-07-19 ENCOUNTER — Other Ambulatory Visit: Payer: Self-pay | Admitting: *Deleted

## 2015-07-19 DIAGNOSIS — I83019 Varicose veins of right lower extremity with ulcer of unspecified site: Secondary | ICD-10-CM

## 2015-07-19 DIAGNOSIS — L97929 Non-pressure chronic ulcer of unspecified part of left lower leg with unspecified severity: Principal | ICD-10-CM

## 2015-07-19 DIAGNOSIS — L97919 Non-pressure chronic ulcer of unspecified part of right lower leg with unspecified severity: Secondary | ICD-10-CM

## 2015-07-19 DIAGNOSIS — I83029 Varicose veins of left lower extremity with ulcer of unspecified site: Secondary | ICD-10-CM

## 2015-07-23 ENCOUNTER — Encounter: Payer: Self-pay | Admitting: Vascular Surgery

## 2015-07-26 ENCOUNTER — Encounter: Payer: Self-pay | Admitting: Vascular Surgery

## 2015-07-29 ENCOUNTER — Encounter: Payer: Self-pay | Admitting: Vascular Surgery

## 2015-07-29 ENCOUNTER — Ambulatory Visit (INDEPENDENT_AMBULATORY_CARE_PROVIDER_SITE_OTHER): Payer: Commercial Managed Care - HMO | Admitting: Vascular Surgery

## 2015-07-29 VITALS — BP 158/92 | HR 82 | Temp 97.6°F | Resp 16 | Ht 68.5 in | Wt 228.0 lb

## 2015-07-29 DIAGNOSIS — I83892 Varicose veins of left lower extremities with other complications: Secondary | ICD-10-CM | POA: Diagnosis not present

## 2015-07-29 NOTE — Progress Notes (Signed)
Subjective:     Patient ID: Sarah Horton, female   DOB: 01-23-1951, 65 y.o.   MRN: [id number]  HPI This 65 year old female had laser ablation left great saphenous vein from the proximal calf to near the saphenofemoral junction +10-20 stab phlebectomy of painful varicosities performed under local tumescent anesthesia. She tolerated the procedure well.   Review of Systems     Objective:   Physical Exam BP 158/92 mmHg  Pulse 82  Temp(Src) 97.6 F (36.4 C)  Resp 16  Ht 5' 8.5" (1.74 m)  Wt 228 lb (103.42 kg)  BMI 34.16 kg/m2  SpO2 98%  .j     Assessment:      well-tolerated laser ablation left great saphenous vein +10-20 stab phlebectomy of painful varicosities performed under local tumescent anesthesia     Plan:      return in 1 week for venous duplex exam to confirm closure left great saphenous vein

## 2015-07-29 NOTE — Progress Notes (Signed)
Laser Ablation Procedure    Date: 07/29/2015   Sarah Horton DOB:[birth year]  Consent signed: Yes    Surgeon:  Dr. Quita Skye. [facility] [place]  Procedure: Laser Ablation: left Greater Saphenous Vein  BP 158/92 mmHg  Pulse 82  Temp(Src) 97.6 F (36.4 C)  Resp 16  Ht 5' 8.5" (1.74 m)  Wt 228 lb (103.42 kg)  BMI 34.16 kg/m2  SpO2 98%  Tumescent Anesthesia: 450 cc 0.9% NaCl with 50 cc Lidocaine HCL with 1% Epi and 15 cc 8.4% NaHCO3  Local Anesthesia: 8 cc Lidocaine HCL and NaHCO3 (ratio 2:1)  Pulsed Mode: 15 watts, delay, 1.0 duration  Total Energy:  2140            Total Pulses:  143              Total Time: 2:23    Stab Phlebectomy: 10-20 Sites: Thigh and Calf  Patient tolerated procedure well  Notes:   Description of Procedure:  After marking the course of the secondary varicosities, the patient was placed on the operating table in the supine position, and the left leg was prepped and draped in sterile fashion.   Local anesthetic was administered and under ultrasound guidance the saphenous vein was accessed with a micro needle and guide wire; then the mirco puncture sheath was placed.  A guide wire was inserted saphenofemoral junction , followed by a 5 french sheath.  The position of the sheath and then the laser fiber below the junction was confirmed using the ultrasound.  Tumescent anesthesia was administered along the course of the saphenous vein using ultrasound guidance. The patient was placed in Trendelenburg position and protective laser glasses were placed on patient and staff, and the laser was fired at 15 watts continuous mode advancing 1-73mm/second for a total of 2140 joules.   For stab phlebectomies, local anesthetic was administered at the previously marked varicosities, and tumescent anesthesia was administered around the vessels.  Ten to 20 stab wounds were made using the tip of an 11 blade. And using the vein hook, the phlebectomies were performed using a  hemostat to avulse the varicosities.  Adequate hemostasis was achieved.     Steri strips were applied to the stab wounds and ABD pads and thigh high compression stockings were applied.  Ace wrap bandages were applied over the phlebectomy sites and at the top of the saphenofemoral junction. Blood loss was less than 15 cc.  The patient ambulated out of the operating room having tolerated the procedure well.

## 2015-07-30 ENCOUNTER — Telehealth: Payer: Self-pay | Admitting: *Deleted

## 2015-07-30 NOTE — Telephone Encounter (Signed)
Pt doing well. Following all instructions. No bleeding from phleb sites.

## 2015-08-05 ENCOUNTER — Ambulatory Visit (HOSPITAL_COMMUNITY)
Admission: RE | Admit: 2015-08-05 | Discharge: 2015-08-05 | Disposition: A | Discharge status: Home / Self Care | Payer: Commercial Managed Care - HMO | Source: Ambulatory Visit | Attending: Vascular Surgery | Admitting: Vascular Surgery

## 2015-08-05 ENCOUNTER — Ambulatory Visit (INDEPENDENT_AMBULATORY_CARE_PROVIDER_SITE_OTHER): Payer: Commercial Managed Care - HMO | Admitting: Vascular Surgery

## 2015-08-05 ENCOUNTER — Encounter: Payer: Self-pay | Admitting: Vascular Surgery

## 2015-08-05 ENCOUNTER — Ambulatory Visit: Payer: Commercial Managed Care - HMO | Admitting: Adult Health

## 2015-08-05 VITALS — BP 133/76 | HR 98 | Temp 98.4°F | Resp 16 | Ht 68.5 in | Wt 225.0 lb

## 2015-08-05 DIAGNOSIS — I83892 Varicose veins of left lower extremities with other complications: Secondary | ICD-10-CM

## 2015-08-05 DIAGNOSIS — Z9889 Other specified postprocedural states: Secondary | ICD-10-CM | POA: Insufficient documentation

## 2015-08-05 DIAGNOSIS — I83029 Varicose veins of left lower extremity with ulcer of unspecified site: Secondary | ICD-10-CM

## 2015-08-05 DIAGNOSIS — L97929 Non-pressure chronic ulcer of unspecified part of left lower leg with unspecified severity: Secondary | ICD-10-CM

## 2015-08-05 NOTE — Progress Notes (Signed)
Subjective:     Patient ID: Sarah Horton, female   DOB: 08-18-1950, 65 y.o.   MRN: [id number]  HPI  This 65 year old female returns 1 week post-laser ablation left great saphenous vein plus multiple stab phlebectomy of painful varicosities in the distal thigh and calf area. She has worn her elastic compression stocking as instructed and tken ibuprofen. She has had some mild discomfort in the proximal thigh near the inguinal area but otherwise states that this has not been a painful experience. She has noticed slightly less swelling in the left ankle and foot area. She denies any chest pain, dyspnea on exerton, PND, orthopnea, or hemoptysis.  Review of Systems     Objective:   Physical Exam BP 133/76 mmHg  Pulse 98  Temp(Src) 98.4 F (36.9 C)  Resp 16  Ht 5' 8.5" (1.74 m)  Wt 225 lb (102.059 kg)  BMI 33.71 kg/m2  SpO2 96%   Gen. Well-developed well-nourished female in no apparent distress alert and oriented 3 Lungs no rhonchi or wheezing Left leg with Steri-Strips in place over stab phlebectomy site. Incisions appear to be healing nicely. 1+ distal edema. No active ulcer noted. 3+ dorsalispedis pulse palpable. Mild discomfort to deep palpation over great saphenous vein in the mid and proximal thigh.   today I ordered a venous duplex exam of the left leg which I reviewed and interpreted. There is no DVT. There is total closure of the great saphenous vein from the distal thigh to near the saphenofemoral junction.     Assessment:       Successful laser ablation left great saphenous vein with multiple stab phlebectomy performed under local tumescent anesthesia 1 week ago     Plan:      return next week for similar procedure in  right small saphenous vein

## 2015-08-06 ENCOUNTER — Encounter: Payer: Self-pay | Admitting: Vascular Surgery

## 2015-08-08 ENCOUNTER — Ambulatory Visit: Payer: Commercial Managed Care - HMO | Admitting: Adult Health

## 2015-08-08 ENCOUNTER — Encounter: Payer: Self-pay | Admitting: Adult Health

## 2015-08-08 ENCOUNTER — Ambulatory Visit (INDEPENDENT_AMBULATORY_CARE_PROVIDER_SITE_OTHER): Payer: Commercial Managed Care - HMO | Admitting: Adult Health

## 2015-08-08 VITALS — BP 122/70 | HR 92 | Temp 98.2°F | Ht 68.5 in | Wt 229.4 lb

## 2015-08-08 DIAGNOSIS — L4050 Arthropathic psoriasis, unspecified: Secondary | ICD-10-CM

## 2015-08-08 DIAGNOSIS — R030 Elevated blood-pressure reading, without diagnosis of hypertension: Secondary | ICD-10-CM

## 2015-08-08 DIAGNOSIS — IMO0001 Reserved for inherently not codable concepts without codable children: Secondary | ICD-10-CM

## 2015-08-08 NOTE — Patient Instructions (Signed)
Check blood pressure 3 times a week. Keep log . Call if b/p is >140/90.  Continue on current regimen  I will work on paperwork .  Follow up Dr. Kriste Basque  As planned and As needed

## 2015-08-08 NOTE — Progress Notes (Signed)
Subjective:    Patient ID: Sarah Horton, female    DOB: Nov 02, 1950, 65 y.o.   MRN: [id number]  HPI 65 yo former smoker with recurrent bronchitis , psoriatic arthritis   08/08/2015 Follow up : Blood pressure  Pt returns for follow up for elevated blood pressure .  She was having an arthritic flare at Rheumatologist. B/p was 157/87.  She is better now. Stopped adderall and pain is some better. B/p is better .  Today b/p is normal.  She has been having a very difficult time, has had poor healing venous ulcers on legs . Had to stop taking Enbrel due to poor healing. Since then has had a terrible flare of arthritis, can barely move some days.  Tearful. She has been out of work due to this.  Wounds are now healing now . Had to have ablation on left leg veins . Has ablation next week on right  Has to wait until healed to restart on Enbrel. Joint are very painful and stiff.  Needs papers filled out for work. She is not able to work.  Denies headache, visual/speech changes or ext weakness  Not taking decongestants .does take nsaids       Past Medical History  Diagnosis Date  . Allergic rhinitis, cause unspecified   . Unspecified chronic bronchitis (HCC)   . Unspecified venous (peripheral) insufficiency   . Unspecified hypothyroidism   . Obesity, unspecified   . Esophageal reflux   . Irritable bowel syndrome   . Psoriatic arthropathy (HCC)   . Unspecified hereditary and idiopathic peripheral neuropathy   . Anxiety state, unspecified   . Anemia, unspecified   . Allergy   . Arthritis   . Cataract     bilaterally removed [id number]   Current Outpatient Prescriptions on File Prior to Visit  Medication Sig Dispense Refill  . calcium carbonate (OS-CAL) 600 MG TABS Take 600 mg by mouth 2 (two) times daily with a meal.      . cetirizine (ZYRTEC) 10 MG tablet Take 10 mg by mouth daily.    . famotidine (PEPCID) 10 MG tablet Take 20 mg by mouth daily.     Marland Kitchen lactobacillus  acidophilus (BACID) TABS tablet Take 1 tablet by mouth daily.    Marland Kitchen levothyroxine (SYNTHROID, LEVOTHROID) 50 MCG tablet Take 75 mcg by mouth daily.     Marland Kitchen MAGNESIUM PO Take 750 mg by mouth daily.    . methocarbamol (ROBAXIN) 500 MG tablet 1-2 tablets by mouth daily as needed     . montelukast (SINGULAIR) 10 MG tablet Take 10 mg by mouth at bedtime.     . Multiple Vitamin (MULTIVITAMIN) tablet Take 1 tablet by mouth daily.      . Naproxen Sodium (ALEVE PO) Take 2 tablets by mouth 2 (two) times daily before a meal.    . promethazine (PHENERGAN) 25 MG tablet TAKE 1 TABLET (25 MG TOTAL) BY MOUTH EVERY 6 (SIX) HOURS AS NEEDED FOR NAUSEA. 30 tablet 0  . SUMAtriptan (IMITREX) 20 MG/ACT nasal spray Place 1 spray (20 mg total) into the nose every 2 (two) hours as needed. 1 Inhaler 5  . traMADol (ULTRAM) 50 MG tablet Take 1 tablet (50 mg total) by mouth 3 (three) times daily as needed. 90 tablet 0  . etanercept (ENBREL) 50 MG/ML injection Inject 50 mg into the skin once a week. Reported on 08/08/2015     No current facility-administered medications on file prior to visit.  Review of Systems Constitutional:   No  weight loss, night sweats,  Fevers, chills, fatigue, or  lassitude.  HEENT:   No headaches,  Difficulty swallowing,  Tooth/dental problems, or  Sore throat,                No sneezing, itching, ear ache, nasal congestion, post nasal drip,   CV:  No chest pain,  Orthopnea, PND, swelling in lower extremities, anasarca, dizziness, palpitations, syncope.   GI  No heartburn, indigestion, abdominal pain, nausea, vomiting, diarrhea, change in bowel habits, loss of appetite, bloody stools.   Resp: No shortness of breath with exertion or at rest.  No excess mucus, no productive cough,  No non-productive cough,  No coughing up of blood.  No change in color of mucus.  No wheezing.  No chest wall deformity  Skin: no rash or lesions.  GU: no dysuria, change in color of urine, no urgency or frequency.   No flank pain, no hematuria   MS:  ++joint pain  Psych:  No change in mood or affect. No depression or anxiety.  No memory loss.         Objective:   Physical Exam  Filed Vitals:   08/08/15 1642  BP: 122/70  Pulse: 92  Temp: 98.2 F (36.8 C)  TempSrc: Oral  Height: 5' 8.5" (1.74 m)  Weight: 229 lb 6.4 oz (104.055 kg)  SpO2: 94%   GEN: A/Ox3; pleasant , NAD, obese   HEENT:  [place]/AT,  EACs-clear, TMs-wnl, NOSE-clear, THROAT-clear, no lesions, no postnasal drip or exudate noted.   NECK:  Supple w/ fair ROM; no JVD; normal carotid impulses w/o bruits; no thyromegaly or nodules palpated; no lymphadenopathy.  RESP  Clear  P & A; w/o, wheezes/ rales/ or rhonchi.no accessory muscle use, no dullness to percussion  CARD:  RRR, no m/r/g  , no peripheral edema, pulses intact, no cyanosis or clubbing.  GI:   Soft & nt; nml bowel sounds; no organomegaly or masses detected.  Musco: Warm bil, arthritic changes   Neuro: alert, no focal deficits noted.    Skin: Warm, no lesions or rashes  Gwendoline Judy NP-C  [facility]  08/08/2015       Assessment & Plan:

## 2015-08-09 ENCOUNTER — Encounter: Payer: Self-pay | Admitting: Adult Health

## 2015-08-09 DIAGNOSIS — R030 Elevated blood-pressure reading, without diagnosis of hypertension: Principal | ICD-10-CM

## 2015-08-09 DIAGNOSIS — IMO0001 Reserved for inherently not codable concepts without codable children: Secondary | ICD-10-CM | POA: Insufficient documentation

## 2015-08-09 NOTE — Assessment & Plan Note (Signed)
Blood pressure is well controlled at ov today  Advised on low salt diet  To begin checking bp 3 d week and keep log   Plan  Check blood pressure 3 times a week. Keep log . Call if b/p is >140/90.  Continue on current regimen  I will work on paperwork .  Follow up Dr. Kriste BasqueNadel  As planned and As needed

## 2015-08-09 NOTE — Assessment & Plan Note (Signed)
OOW paperwork done today  Cont follow up with Rheumatology

## 2015-08-12 ENCOUNTER — Encounter: Payer: Self-pay | Admitting: Vascular Surgery

## 2015-08-12 ENCOUNTER — Ambulatory Visit (INDEPENDENT_AMBULATORY_CARE_PROVIDER_SITE_OTHER): Payer: Commercial Managed Care - HMO | Admitting: Vascular Surgery

## 2015-08-12 VITALS — BP 151/84 | HR 97 | Temp 98.0°F | Resp 16 | Ht 68.5 in | Wt 228.0 lb

## 2015-08-12 DIAGNOSIS — I83891 Varicose veins of right lower extremities with other complications: Secondary | ICD-10-CM | POA: Diagnosis not present

## 2015-08-12 NOTE — Progress Notes (Signed)
Laser Ablation Procedure    Date: 08/12/2015   Sarah Horton DOB:[birth year]  Consent signed: Yes    Surgeon:  Dr. Quita SkyeJames D. Hart RochesterLawson  Procedure: Laser Ablation: right Small Saphenous Vein  BP 151/84 mmHg  Pulse 97  Temp(Src) 98 F (36.7 C)  Resp 16  Ht 5' 8.5" (1.74 m)  Wt 228 lb (103.42 kg)  BMI 34.16 kg/m2  SpO2 99%  Tumescent Anesthesia: 200 cc 0.9% NaCl with 50 cc Lidocaine HCL with 1% Epi and 15 cc 8.4% NaHCO3  Local Anesthesia: 4 cc Lidocaine HCL and NaHCO3 (ratio 2:1)  Pulsed Mode: 15 watts, 500ms delay, 1.0 duration  Total Energy:  968            Total Pulses:   65             Total Time: 1:04    Stab Phlebectomy: 10-20 Sites: Calf  Patient tolerated procedure well  Notes:   Description of Procedure:  After marking the course of the secondary varicosities, the patient was placed on the operating table in the prone position, and the right leg was prepped and draped in sterile fashion.   Local anesthetic was administered and under ultrasound guidance the saphenous vein was accessed with a micro needle and guide wire; then the mirco puncture sheath was placed.  A guide wire was inserted saphenopopliteal junction , followed by a 5 french sheath.  The position of the sheath and then the laser fiber below the junction was confirmed using the ultrasound.  Tumescent anesthesia was administered along the course of the saphenous vein using ultrasound guidance. The patient was placed in Trendelenburg position and protective laser glasses were placed on patient and staff, and the laser was fired at 15 watts continuous mode advancing 1-672mm/second for a total of 968 joules.   For stab phlebectomies, local anesthetic was administered at the previously marked varicosities, and tumescent anesthesia was administered around the vessels.  Ten to 20 stab wounds were made using the tip of an 11 blade. And using the vein hook, the phlebectomies were performed using a hemostat to avulse the  varicosities.  Adequate hemostasis was achieved.     Steri strips were applied to the stab wounds and ABD pads and thigh high compression stockings were applied.  Ace wrap bandages were applied over the phlebectomy sites and at the top of the saphenopopliteal junction. Blood loss was less than 15 cc.  The patient ambulated out of the operating room having tolerated the procedure well.

## 2015-08-12 NOTE — Progress Notes (Signed)
Subjective:     Patient ID: Lawerance BachCatherine M Mcdowell, female   DOB: [birth year], 65 y.o.   MRN: [id number]  HPI this 65 year old female laser ablation of the right small saphenous vein +10-20 stab phlebectomy of painful varicosities performed under local tumescent anesthesia. A total of 9680 J of energy was utilized. She tolerated the procedure well.   Review of Systems     Objective:   Physical Exam BP 151/84 mmHg  Pulse 97  Temp(Src) 98 F (36.7 C)  Resp 16  Ht 5' 8.5" (1.74 m)  Wt 228 lb (103.42 kg)  BMI 34.16 kg/m2  SpO2 99%       Assessment:     Well-tolerated laser ablation right small saphenous vein +10-20 stab phlebectomy of painful varicosities performed under local tumescent anesthesia    Plan:     Return in 1 week for venous duplex exam to confirm closure right small saphenous vein

## 2015-08-13 ENCOUNTER — Telehealth: Payer: Self-pay | Admitting: *Deleted

## 2015-08-13 ENCOUNTER — Telehealth: Payer: Self-pay | Admitting: Adult Health

## 2015-08-13 NOTE — Telephone Encounter (Signed)
Called and spoke with pt. She states that there is a 2 page form that looks as if it is completed by SN but was completed by the patient and her sister. She stated the only thing missing on the forms were SN's signature and Tax ID #. She apologized for not writing example at the top of the form so that we were aware it was just a form to follow. She states a empty form is located with the completed form for TP to complete. I explained to her that TP was out of the office today but would return tomorrow and that this would be addressed at her return. She voiced understanding and had no further questions.   Forms are located at TP's look at. Will await TP's return.

## 2015-08-13 NOTE — Telephone Encounter (Signed)
Pt doing well. No problems. Following all instructions. 

## 2015-08-14 NOTE — Telephone Encounter (Signed)
Done by Dr. Kriste BasqueNadel

## 2015-08-15 NOTE — Telephone Encounter (Signed)
Forms have been completed and placed at my desk.  LVM for pt to inform her that forms are ready for pick up.

## 2015-08-15 NOTE — Telephone Encounter (Signed)
Pt returned call. I explained to her that forms are ready for pick. She voiced understanding and had no further questions. Nothing further needed.

## 2015-08-19 ENCOUNTER — Encounter: Payer: Self-pay | Admitting: Vascular Surgery

## 2015-08-19 ENCOUNTER — Ambulatory Visit (INDEPENDENT_AMBULATORY_CARE_PROVIDER_SITE_OTHER): Payer: Self-pay | Admitting: Vascular Surgery

## 2015-08-19 ENCOUNTER — Ambulatory Visit (HOSPITAL_COMMUNITY)
Admission: RE | Admit: 2015-08-19 | Discharge: 2015-08-19 | Disposition: A | Discharge status: Home / Self Care | Payer: Commercial Managed Care - HMO | Source: Ambulatory Visit | Attending: Vascular Surgery | Admitting: Vascular Surgery

## 2015-08-19 VITALS — BP 136/82 | HR 78 | Temp 97.0°F | Resp 16

## 2015-08-19 DIAGNOSIS — I83891 Varicose veins of right lower extremities with other complications: Secondary | ICD-10-CM

## 2015-08-19 DIAGNOSIS — I83029 Varicose veins of left lower extremity with ulcer of unspecified site: Secondary | ICD-10-CM | POA: Diagnosis present

## 2015-08-19 DIAGNOSIS — L97929 Non-pressure chronic ulcer of unspecified part of left lower leg with unspecified severity: Secondary | ICD-10-CM

## 2015-08-19 NOTE — Progress Notes (Signed)
Subjective:     Patient ID: Sarah BachCatherine M Horton, female   DOB: 1950-08-08, 65 y.o.   MRN: [id number]  HPI this 65 year old female returns 1 week post-laser ablation right small saphenous vein plus multiple stab phlebectomy of painful varicosities performed under local tumescent anesthesia. She has had less discomfort in the right calf that she had the contralateral left thigh. She states she notices it definite change in distal edema bilaterally. She has taken ibuprofen as instructed. She has had no chest pain dyspnea on exertion PND orthopnea or hemoptysis.   Review of Systems     Objective:   Physical Exam BP 136/82 mmHg  Pulse 78  Temp(Src) 97 F (36.1 C)  Resp 16  SpO2 97%  Gen. well-developed well-nourished female no apparent distress alert and oriented 3 Lungs no rhonchi or wheezing Right leg with 3+ dorsalis pedis pulse palpable. Stab phlebectomy sites and posterior calf are healing nicely. Mild tenderness to deep palpation over small saphenous vein.  Today I ordered a venous duplex exam of the right leg which I reviewed and interpreted. There is no DVT. There is total closure of the right small saphenous vein.       Assessment:     Successful laser ablation right small saphenous and left great saphenous veins with multiple stab phlebectomy bilaterally    Plan:     Return to see me on when necessary basis Okay to resume her psoriatic arthritis medication-Embril

## 2015-08-28 ENCOUNTER — Ambulatory Visit: Payer: Commercial Managed Care - HMO | Attending: Rheumatology | Admitting: Physical Therapy

## 2015-08-28 ENCOUNTER — Encounter: Payer: Self-pay | Admitting: Physical Therapy

## 2015-08-28 DIAGNOSIS — M545 Low back pain, unspecified: Secondary | ICD-10-CM

## 2015-08-28 DIAGNOSIS — R29898 Other symptoms and signs involving the musculoskeletal system: Secondary | ICD-10-CM

## 2015-08-28 DIAGNOSIS — M6289 Other specified disorders of muscle: Secondary | ICD-10-CM | POA: Insufficient documentation

## 2015-08-28 NOTE — Therapy (Signed)
[facility] [address]  [address] [place], [place], [address] Phone: [phone]   Fax:  [phone]  Physical Therapy Evaluation  Patient Details  Name: Sarah Horton MRN: [id number] Date of Birth: 1950-12-19 Referring Provider: Corliss Skains  Encounter Date: 08/28/2015      PT End of Session - 08/28/15 1030    Visit Number 1   Number of Visits 12   Date for PT Re-Evaluation 10/09/15   PT Start Time 1023   PT Stop Time 1110   PT Time Calculation (min) 47 min      Past Medical History  Diagnosis Date  . Allergic rhinitis, cause unspecified   . Unspecified chronic bronchitis (HCC)   . Unspecified venous (peripheral) insufficiency   . Unspecified hypothyroidism   . Obesity, unspecified   . Esophageal reflux   . Irritable bowel syndrome   . Psoriatic arthropathy (HCC)   . Unspecified hereditary and idiopathic peripheral neuropathy   . Anxiety state, unspecified   . Anemia, unspecified   . Allergy   . Arthritis   . Cataract     bilaterally removed [id number]    Past Surgical History  Procedure Laterality Date  . Cataract extraction, bilateral      [id number]  . Nasal sinus surgery    . Abdominal hysterectomy    . Shoulder surgery  2005  . Bilat tkrs  04/2010    by [name]  . Joint replacement Bilateral     knees  . Bunionectomy  2009  . Dequervains tensenovitis Left   . Colonoscopy  08-26-2004    with gessner tics and hems   . Ablation on endometriosis      x2    There were no vitals filed for this visit.  Visit Diagnosis:  Bilateral low back pain without sciatica  Weakness of both hips      Subjective Assessment - 08/28/15 1032    Subjective Pt with c/o B lower back pain (L>R) as well as R shoulder and B Hand pain.  Pt with psoriatic arthritis.  She had to go off her medication in August 2016 (was on remission) in order to have LE vein surgery.  While off meds had a flare-up and has noted  increased LBP, R Shoulder pain, and B hand pain.  She recieved cortisone injection to R shoulder last week with some benefit noted.   Pertinent History psoriatic arthritis   Currently in Pain? Yes   Pain Score --  AVG 4/10 with minimal activity, 8/10 at worst, 2/10 at best   Pain Location Back   Pain Orientation Left;Right;Lower  L>R   Pain Descriptors / Indicators Sharp;Aching;Dull   Pain Radiating Towards B Lower back and some into B buttock (L worse than R)   Pain Onset More than a month ago   Pain Frequency Constant   Aggravating Factors  prolonged standing, prolonged sitting            [facility] PT Assessment - 08/28/15 0001    Assessment   Medical Diagnosis LBP, R Shoulder Pain   Referring Provider Deveshwar   Onset Date/Surgical Date 06/09/95   Next MD Visit June 2017   Balance Screen   Has the patient fallen in the past 6 months No   Has the patient had a decrease in activity level because of a fear of falling?  No   Is the patient reluctant to leave their home because of a fear of falling?  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   Leisure enjoys travelling (limited tolerance with travelling however), mostly sedentary lifestyle currently, has stationary bike Systems developer but is not using   Posture/Postural Control   Posture Comments standing anterior pelvic tilt   ROM / Strength   AROM / PROM / Strength Strength;AROM   AROM   AROM Assessment Site Lumbar   Lumbar Flexion hands to ankles   Lumbar Extension 50% normal without c/o pain   Strength   Strength Assessment Site Hip;Knee   Right/Left Hip Right;Left   Right Hip Flexion 4/5   Right Hip Extension 4/5   Right Hip External Rotation  4/5   Right Hip Internal Rotation 4/5   Right Hip ABduction 4/5   Right Hip ADduction 5/5   Left Hip Flexion 4/5   Left Hip Extension 4/5   Left Hip External Rotation 4+/5    Left Hip Internal Rotation 4+/5   Left Hip ABduction 4+/5   Left Hip ADduction 5/5   Right/Left Knee Right;Left   Right Knee Flexion 5/5   Right Knee Extension 5/5   Left Knee Flexion 5/5   Left Knee Extension 4+/5   Flexibility   Soft Tissue Assessment /Muscle Length yes   Hamstrings normal   Quadriceps B very tight   Piriformis B Tight and tender   Palpation   Palpation comment very tender B SIJ   Special Tests    Special Tests --  POS R FABER         TODAY'S TREATMENT TherEx - Bridge Seated Hip ABD black TB Prone Knee Flexion stretch         PT Education - 08/28/15 1310    Education provided Yes   Education Details initial HEP, PT POC   Person(s) Educated Patient   Methods Explanation;Demonstration;Handout   Comprehension Verbalized understanding;Returned demonstration          PT Short Term Goals - 08/28/15 1358    PT SHORT TERM GOAL #1   Title independent with initial HEP for LBP by 09/11/15   Status New   PT SHORT TERM GOAL #2   Title assess R Shoulder by 09/18/15 and update POC and LTG as necessary   Status New           PT Long Term Goals - 08/28/15 1359    PT LONG TERM GOAL #1   Title pt independent with advanced HEP as necessary for LBP by 10/09/15   Status New   PT LONG TERM GOAL #2   Title B LE MMT 4+/5 or better without pain by 10/09/15   Status New   PT LONG TERM GOAL #3   Title pt able to sit as necessary for work and travel without limitation by LBP by 10/09/15   Status New   PT LONG TERM GOAL #4   Title pt able to stand as necessary for shopping and ADLs/chores without limitation by LBP by 10/09/15   Status New               Plan - 08/28/15 1048    Clinical Impression Statement Sarah Horton was sent to OPPT due to LBP, R Shoulder pain, and B hand pain.  I advised her that we will have an OT seeing patients out here in a few weeks and that she would be the one to work with her hands.  Today's assessment  focused on c/o LBP and we  will look into shoulder pain in the future as her back improves.  She states her LBP is constant in nature rating 2/10 at best, 4/10 on AVG, and up to 8/10 at worst.  Pain is increased with prolonged standing or sitting.  Today's assessment: standing posture includes anterior pelvic tilt with increased lumbar lordosis, significant tenderness to B SIJ area, and Special Testing includes POS FABER on R and POS B Eli's for LBP and very tight recus femoris.  She displays weakness in B hips with L worse than R, although no c/o LBP with MMT  assessment.  Lumbar AROM is WFL into flexion and limited to approx 50% normal into extension, no c/o pain with AROM.  Difficult to be certain give minimal pain provocation with today's assessment, but it seems combination of a lumbopelvic stability issue and likely some facet pain.  She should note significant improvement in her pain with PT intervention within the next 6wks.   Pt will benefit from skilled therapeutic intervention in order to improve on the following deficits Pain;Postural dysfunction;Improper body mechanics;Decreased strength;Decreased range of motion;Impaired flexibility   Rehab Potential Fair   Clinical Impairments Affecting Rehab Potential obese, psoriatic arthritis, sedentary   PT Frequency 2x / week   PT Duration 6 weeks   PT Treatment/Interventions Manual techniques;Therapeutic exercise;Therapeutic activities;Traction;Dry needling;Taping;Patient/family education;Balance training;Functional mobility training;Cryotherapy;Electrical Stimulation;Moist Heat   PT Next Visit Plan lumbopelvic stability training with mild flexion bias; B hip strengthening to tolerance; stretch B hip flexors and RF; modalities PRN; assess R Shoulder once LBP begins to improve   Consulted and Agree with Plan of Care Patient          G-Codes - 08/28/15 1308    Functional Assessment Tool Used FOTO 50% limitation   Functional Limitation Other PT subsequent   Other PT  Secondary Current Status ([id number]) At least 40 percent but less than 60 percent impaired, limited or restricted   Other PT Secondary Goal Status (H0865(G8994) At least 20 percent but less than 40 percent impaired, limited or restricted       Problem List Patient Active Problem List   Diagnosis Date Noted  . Varicose veins of right lower extremity with complications 08/12/2015  . Elevated BP 08/09/2015  . Varicose veins of left lower extremity with complications 07/29/2015  . Varicose veins of bilateral lower extremities with other complications 07/09/2015  . Varicose veins of lower extremities with ulcer (HCC) 04/05/2015  . Status post bilateral knee replacements 12/13/2013  . Venous stasis ulcer of ankle (HCC) 06/06/2013  . ADD (attention deficit disorder) 04/04/2012  . INSOMNIA 06/20/2010  . HYPOTHYROIDISM, BORDERLINE 01/17/2010  . OBESITY 01/17/2010  . DEGENERATIVE JOINT DISEASE 01/17/2010  . ANEMIA 10/02/2009  . SINUSITIS, ACUTE 10/02/2009  . ASTHMA 10/02/2009  . Venous (peripheral) insufficiency 03/11/2008  . ALLERGIC RHINITIS 03/11/2008  . BRONCHITIS, RECURRENT 03/11/2008  . MIGRAINES, HX OF 03/11/2008  . ANXIETY 11/24/2007  . Hereditary and idiopathic peripheral neuropathy 11/24/2007  . GERD 11/24/2007  . IRRITABLE BOWEL SYNDROME 11/24/2007  . PSORIATIC ARTHROPATHY 11/24/2007    Presly Steinruck PT, OCS 08/28/2015, 2:01 PM  [facility] [address]  [address] [place], [place], [address] Phone: [phone]   Fax:  [phone]  Name: Sarah BachCatherine M Horton MRN: [id number] Date of Birth: 18-Jan-1951

## 2015-09-03 ENCOUNTER — Ambulatory Visit: Payer: Commercial Managed Care - HMO

## 2015-09-03 DIAGNOSIS — M545 Low back pain, unspecified: Secondary | ICD-10-CM

## 2015-09-03 DIAGNOSIS — R29898 Other symptoms and signs involving the musculoskeletal system: Secondary | ICD-10-CM

## 2015-09-03 NOTE — Therapy (Signed)
[facility] [address]  [address] [place], [place], [address] Phone: [phone]   Fax:  [phone]  Physical Therapy Treatment  Patient Details  Name: Sarah Horton MRN: [id number] Date of Birth: Mar 26, 1951 Referring Provider: Corliss Skains  Encounter Date: 09/03/2015      PT End of Session - 09/03/15 0858    Visit Number 2   Number of Visits 12   Date for PT Re-Evaluation 10/09/15   PT Start Time 0858  treatment started late due to pt. arriving late to therapy.   PT Stop Time 0930   PT Time Calculation (min) 32 min      Past Medical History  Diagnosis Date  . Allergic rhinitis, cause unspecified   . Unspecified chronic bronchitis (HCC)   . Unspecified venous (peripheral) insufficiency   . Unspecified hypothyroidism   . Obesity, unspecified   . Esophageal reflux   . Irritable bowel syndrome   . Psoriatic arthropathy (HCC)   . Unspecified hereditary and idiopathic peripheral neuropathy   . Anxiety state, unspecified   . Anemia, unspecified   . Allergy   . Arthritis   . Cataract     bilaterally removed [id number]    Past Surgical History  Procedure Laterality Date  . Cataract extraction, bilateral      [id number]  . Nasal sinus surgery    . Abdominal hysterectomy    . Shoulder surgery  2005  . Bilat tkrs  04/2010    by [name]  . Joint replacement Bilateral     knees  . Bunionectomy  2009  . Dequervains tensenovitis Left   . Colonoscopy  08-26-2004    with gessner tics and hems   . Ablation on endometriosis      x2    There were no vitals filed for this visit.  Visit Diagnosis:  Bilateral low back pain without sciatica  Weakness of both hips      Subjective Assessment - 09/03/15 0905    Subjective 2/10 LBP today.  No other pain complaints reports.  Pt. reports taking arthritis medication last 2 weeks and seeing benefits in hands and shoulders.   Currently in Pain? Yes   Pain Score 2     Pain Location Back   Pain Orientation Left;Right;Lower   Pain Descriptors / Indicators Sharp;Aching;Dull   Pain Type Acute pain   Pain Radiating Towards B lower back L worse than R    Pain Onset More than a month ago   Pain Frequency Constant   Aggravating Factors  prlonged stnading, prolonged sitting   Multiple Pain Sites No        TODAY'S TREATMENT TherEx:  NuStep 4 min, level 3  B HS, SKTC, piri, RF x 30 sec each Hooklying clam shell with blue TB around knees x 15 reps LTR x 5 each way 5" hold  Bridge x 10 reps  Hooklying marching with abdominal bracing 2 x 10 reps  HEP review         PT Short Term Goals - 09/03/15 6962    PT SHORT TERM GOAL #1   Title independent with initial HEP for LBP by 09/11/15  09/03/15: Pt. Ind. with HEP for LBP.     Status Achieved   PT SHORT TERM GOAL #2   Title assess R Shoulder by 09/18/15 and update POC and LTG as necessary   Status On-going           PT Long Term Goals -  08/28/15 1359    PT LONG TERM GOAL #1   Title pt independent with advanced HEP as necessary for LBP by 10/09/15   Status New   PT LONG TERM GOAL #2   Title B LE MMT 4+/5 or better without pain by 10/09/15   Status New   PT LONG TERM GOAL #3   Title pt able to sit as necessary for work and travel without limitation by LBP by 10/09/15   Status New   PT LONG TERM GOAL #4   Title pt able to stand as necessary for shopping and ADLs/chores without limitation by LBP by 10/09/15   Status New               Plan - 09/03/15 0903    Clinical Impression Statement Pt. tolerated all lumbopelvic strengthening and stretching well today with no pain increase.  Today activities with flexion bias approach.  Pt. initial LBP initially 2/10 remaining unchanged throughout treatment.  Pt. R shoulder performed well today.     PT Next Visit Plan lumbopelvic stability training with mild flexion bias; B hip strengthening to tolerance; stretch B hip flexors and RF; modalities PRN;  assess R Shoulder once LBP begins to improve        Problem List Patient Active Problem List   Diagnosis Date Noted  . Varicose veins of right lower extremity with complications 08/12/2015  . Elevated BP 08/09/2015  . Varicose veins of left lower extremity with complications 07/29/2015  . Varicose veins of bilateral lower extremities with other complications 07/09/2015  . Varicose veins of lower extremities with ulcer (HCC) 04/05/2015  . Status post bilateral knee replacements 12/13/2013  . Venous stasis ulcer of ankle (HCC) 06/06/2013  . ADD (attention deficit disorder) 04/04/2012  . INSOMNIA 06/20/2010  . HYPOTHYROIDISM, BORDERLINE 01/17/2010  . OBESITY 01/17/2010  . DEGENERATIVE JOINT DISEASE 01/17/2010  . ANEMIA 10/02/2009  . SINUSITIS, ACUTE 10/02/2009  . ASTHMA 10/02/2009  . Venous (peripheral) insufficiency 03/11/2008  . ALLERGIC RHINITIS 03/11/2008  . BRONCHITIS, RECURRENT 03/11/2008  . MIGRAINES, HX OF 03/11/2008  . ANXIETY 11/24/2007  . Hereditary and idiopathic peripheral neuropathy 11/24/2007  . GERD 11/24/2007  . IRRITABLE BOWEL SYNDROME 11/24/2007  . PSORIATIC ARTHROPATHY 11/24/2007    Kermit BaloMicah Shaylie Eklund, PTA 09/03/2015, 12:22 PM  [facility] [address]  [address] [place], [place], [address] Phone: [phone]   Fax:  [phone]  Name: Lawerance BachCatherine M Beedy MRN: [id number] Date of Birth: [birth year]

## 2015-09-04 ENCOUNTER — Ambulatory Visit: Payer: Commercial Managed Care - HMO

## 2015-09-04 DIAGNOSIS — M545 Low back pain, unspecified: Secondary | ICD-10-CM

## 2015-09-04 DIAGNOSIS — R29898 Other symptoms and signs involving the musculoskeletal system: Secondary | ICD-10-CM

## 2015-09-04 NOTE — Therapy (Signed)
[facility] [address]  [address] [place], [place], [address] Phone: [phone]   Fax:  [phone]  Physical Therapy Treatment  Patient Details  Name: Sarah Horton MRN: [id number] Date of Birth: 23-Jul-1950 Referring Provider: Corliss Skains  Encounter Date: 09/04/2015      PT End of Session - 09/04/15 1835    Visit Number 3   Number of Visits 12   Date for PT Re-Evaluation 10/09/15   PT Start Time 1538   PT Stop Time 1619   PT Time Calculation (min) 41 min   Activity Tolerance Patient tolerated treatment well   Behavior During Therapy [facility] for tasks assessed/performed      Past Medical History  Diagnosis Date  . Allergic rhinitis, cause unspecified   . Unspecified chronic bronchitis (HCC)   . Unspecified venous (peripheral) insufficiency   . Unspecified hypothyroidism   . Obesity, unspecified   . Esophageal reflux   . Irritable bowel syndrome   . Psoriatic arthropathy (HCC)   . Unspecified hereditary and idiopathic peripheral neuropathy   . Anxiety state, unspecified   . Anemia, unspecified   . Allergy   . Arthritis   . Cataract     bilaterally removed [id number]    Past Surgical History  Procedure Laterality Date  . Cataract extraction, bilateral      [id number]  . Nasal sinus surgery    . Abdominal hysterectomy    . Shoulder surgery  2005  . Bilat tkrs  04/2010    by [name]  . Joint replacement Bilateral     knees  . Bunionectomy  2009  . Dequervains tensenovitis Left   . Colonoscopy  08-26-2004    with gessner tics and hems   . Ablation on endometriosis      x2    There were no vitals filed for this visit.  Visit Diagnosis:  Weakness of both hips  Bilateral low back pain without sciatica      Subjective Assessment - 09/04/15 1544    Subjective Pt. reports 2/10 LBP today initially.  No other pain or complaints reported.     Currently in Pain? Yes   Pain Score 2    Pain Location  Back   Pain Orientation Right;Left;Lower   Pain Descriptors / Indicators Sharp;Aching;Dull   Pain Type Acute pain   Pain Radiating Towards B lower back L worse than R   Pain Onset More than a month ago   Pain Frequency Constant       TODAY'S TREATMENT TherEx:  NuStep 4 min, level 3  B HS, SKTC, piri, x 30 sec each B RF stretch in modified thomas position Hooklying clam shell with blue TB around knees x 15 reps LTR x 5 each way 5" hold  Bridge x 10 reps  Hooklying marching with abdominal bracing 2 x 10 reps Abdominal bracing x 10 reps 5" hold  Seated HS curl machine x 15 reps @ #15 B standing dorsiflexion rocker stretch x 30 sec         PT Short Term Goals - 09/03/15 0924    PT SHORT TERM GOAL #1   Title independent with initial HEP for LBP by 09/11/15  09/03/15: Pt. Ind. with HEP for LBP.     Status Achieved   PT SHORT TERM GOAL #2   Title assess R Shoulder by 09/18/15 and update POC and LTG as necessary   Status On-going  PT Long Term Goals - 09/04/15 1839    PT LONG TERM GOAL #1   Title pt independent with advanced HEP as necessary for LBP by 10/09/15   Status On-going   PT LONG TERM GOAL #2   Title B LE MMT 4+/5 or better without pain by 10/09/15   Status On-going   PT LONG TERM GOAL #3   Title pt able to sit as necessary for work and travel without limitation by LBP by 10/09/15   Status On-going   PT LONG TERM GOAL #4   Title pt able to stand as necessary for shopping and ADLs/chores without limitation by LBP by 10/09/15   Status On-going               Plan - 09/04/15 1836    Clinical Impression Statement Pt. tolerated all lumbopelvic strengthening and stretching well today with no pain increase.  Flexion bias approach taken with all activity.  Pt. initial LBP 2/10 remaining unchanged throughout treatment.     PT Next Visit Plan lumbopelvic stability training with mild flexion bias; B hip strengthening to tolerance; stretch B hip flexors and RF;  modalities PRN; assess R Shoulder once LBP begins to improve        Problem List Patient Active Problem List   Diagnosis Date Noted  . Varicose veins of right lower extremity with complications 08/12/2015  . Elevated BP 08/09/2015  . Varicose veins of left lower extremity with complications 07/29/2015  . Varicose veins of bilateral lower extremities with other complications 07/09/2015  . Varicose veins of lower extremities with ulcer (HCC) 04/05/2015  . Status post bilateral knee replacements 12/13/2013  . Venous stasis ulcer of ankle (HCC) 06/06/2013  . ADD (attention deficit disorder) 04/04/2012  . INSOMNIA 06/20/2010  . HYPOTHYROIDISM, BORDERLINE 01/17/2010  . OBESITY 01/17/2010  . DEGENERATIVE JOINT DISEASE 01/17/2010  . ANEMIA 10/02/2009  . SINUSITIS, ACUTE 10/02/2009  . ASTHMA 10/02/2009  . Venous (peripheral) insufficiency 03/11/2008  . ALLERGIC RHINITIS 03/11/2008  . BRONCHITIS, RECURRENT 03/11/2008  . MIGRAINES, HX OF 03/11/2008  . ANXIETY 11/24/2007  . Hereditary and idiopathic peripheral neuropathy 11/24/2007  . GERD 11/24/2007  . IRRITABLE BOWEL SYNDROME 11/24/2007  . PSORIATIC ARTHROPATHY 11/24/2007    Kermit BaloMicah Tamar Miano, PTA 09/04/2015, 6:39 PM  [facility] [address]  [address] [place], [place], [address] Phone: [phone]   Fax:  [phone]  Name: Sarah Horton MRN: [id number] Date of Birth: 1951-02-15

## 2015-09-09 ENCOUNTER — Ambulatory Visit: Payer: Commercial Managed Care - HMO | Attending: Rheumatology | Admitting: Physical Therapy

## 2015-09-09 DIAGNOSIS — M545 Low back pain, unspecified: Secondary | ICD-10-CM

## 2015-09-09 DIAGNOSIS — R29898 Other symptoms and signs involving the musculoskeletal system: Secondary | ICD-10-CM | POA: Insufficient documentation

## 2015-09-09 DIAGNOSIS — M25511 Pain in right shoulder: Secondary | ICD-10-CM

## 2015-09-09 NOTE — Therapy (Signed)
[facility] [address]  [address] [place], [place], [address] Phone: [phone]   Fax:  [phone]  Physical Therapy Treatment  Patient Details  Name: Sarah Horton MRN: [id number] Date of Birth: 01-04-1951 Referring Provider: Corliss Skains  Encounter Date: 09/09/2015      PT End of Session - 09/09/15 1456    Visit Number 4   Number of Visits 12   Date for PT Re-Evaluation 10/09/15   PT Start Time 1455   PT Stop Time 1536   PT Time Calculation (min) 41 min      Past Medical History  Diagnosis Date  . Allergic rhinitis, cause unspecified   . Unspecified chronic bronchitis (HCC)   . Unspecified venous (peripheral) insufficiency   . Unspecified hypothyroidism   . Obesity, unspecified   . Esophageal reflux   . Irritable bowel syndrome   . Psoriatic arthropathy (HCC)   . Unspecified hereditary and idiopathic peripheral neuropathy   . Anxiety state, unspecified   . Anemia, unspecified   . Allergy   . Arthritis   . Cataract     bilaterally removed [id number]    Past Surgical History  Procedure Laterality Date  . Cataract extraction, bilateral      [id number]  . Nasal sinus surgery    . Abdominal hysterectomy    . Shoulder surgery  2005  . Bilat tkrs  04/2010    by [name]  . Joint replacement Bilateral     knees  . Bunionectomy  2009  . Dequervains tensenovitis Left   . Colonoscopy  08-26-2004    with gessner tics and hems   . Ablation on endometriosis      x2    There were no vitals filed for this visit.  Visit Diagnosis:  Bilateral low back pain without sciatica  Pain in right shoulder  Shoulder weakness      Subjective Assessment - 09/09/15 1456    Subjective States R shoulder pain returned this past weekend (had been minimized since injection on 08/23/15). She states her lower back has more tired than anything lately.  States once back becomes fatigued, often becomes painful. She states  her lower back felt better last week but this past weekend LBP AVG was 5/10 and R shoulder pain has been 8/10.  She states she has noted cramping as well as n/t in R hand past couple days.   Currently in Pain? Yes   Pain Score 5    Pain Location Back   Pain Orientation Lower   Multiple Pain Sites Yes   Pain Score 8   Pain Location Shoulder   Pain Orientation Right   Pain Descriptors / Indicators Dull;Aching   Pain Radiating Towards extends from shoulder to elbow; c/o cramping and n/t into R hand   Pain Frequency Constant   Aggravating Factors  constant, denies variation of pain   Pain Relieving Factors heat            [facility] PT Assessment - 09/09/15 1530    AROM   Right Shoulder Flexion 138 Degrees   Right Shoulder ABduction 88 Degrees  pain   Right Shoulder Internal Rotation 55 Degrees  reach to T8 (T5 on L)   Right Shoulder External Rotation 78 Degrees  reach to T3 (L to T4)   Strength   Right Shoulder Flexion 3+/5  pain   Right Shoulder ABduction 3-/5  pain   Right Shoulder Internal Rotation 4/5   Right Shoulder  External Rotation 4/5          TODAY'S TREATMENT Re-Eval - R Shoulder assessment and added to POC (billed with therex since no new foto performed)  TherEx - B ER Yellow TB 10x B Horiz ABD Yellow Tb 10x Pullover AAROM with SPC 8x (stopped due to R upper scapular pain extending into neck) Standing B Scap retraction with shoulder Ext to neutral Yellow TB 15x          PT Education - 09/09/15 1558    Education provided Yes   Education Details Shoulder initial HEP   Person(s) Educated Patient   Methods Explanation;Demonstration;Handout   Comprehension Returned demonstration;Verbalized understanding          PT Short Term Goals - 09/10/15 1456    PT SHORT TERM GOAL #2   Title assess R Shoulder by 09/18/15 and update POC and LTG as necessary   Status Achieved           PT Long Term Goals - 09/10/15 1456    PT LONG TERM GOAL #5   Title  R Shoulder AROM WFL by 10/09/15   Status New   Additional Long Term Goals   Additional Long Term Goals Yes   PT LONG TERM GOAL #6   Title R Shoulder MMT 4/5 or better all planes by 10/09/15   Status New   PT LONG TERM GOAL #7   Title pt able to perform ADLs, chores, and most recreational activities without limitation by R shoulder pain by 10/09/15   Status New               Plan - 09/10/15 1448    Clinical Impression Statement pt with c/o severe increase in R shoulder pain recently.  She had been feeling good following injection to shoulder but pain returned and she rates pain 8/10 lately.  Eval and treat for LBP as well as shouder pain was included on initial referral from MD so I assessed her shoulder today.  AROM limited by pain, MMT 3-/5 into ABD (pain) 3+/5 in Flexion (pain) whereas ER and IR 4/5 without pain. Special Testing indicates subacromial impingement with questionable RC strain.  Provocation testing inconclusive due to pt c/o pain with nearly all movements.  We will add shoulder to current POC; however, there is limited overlap between shoulder and lower back treatments so we will likely spend most time each treatment with are of most pain.  This is certainly not ideal and limits ability to progress both areas but pt unable to focus on lower back due to amount of shoulder pain lately.  She has been using heat at home to address shoulder pain, I advised her to try ice.  In addition she was instructed in initial HEP for shoulder to see if we can improve her pain some quickly.   Pt will benefit from skilled therapeutic intervention in order to improve on the following deficits Pain;Postural dysfunction;Improper body mechanics;Decreased strength;Decreased range of motion;Impaired flexibility;Impaired UE functional use   PT Treatment/Interventions Manual techniques;Therapeutic exercise;Therapeutic activities;Traction;Dry needling;Taping;Patient/family education;Balance training;Functional  mobility training;Cryotherapy;Electrical Stimulation;Moist Heat   PT Next Visit Plan treat R shoulder with scapular stability as able; manual and modalities for pain (try taping); treat LBP with lumbopelvic stability training with mild flexion bias; B hip strengthening to tolerance; stretch B hip flexors and RF   Consulted and Agree with Plan of Care Patient        Problem List Patient Active Problem List   Diagnosis Date Noted  .  Varicose veins of right lower extremity with complications 08/12/2015  . Elevated BP 08/09/2015  . Varicose veins of left lower extremity with complications 07/29/2015  . Varicose veins of bilateral lower extremities with other complications 07/09/2015  . Varicose veins of lower extremities with ulcer (HCC) 04/05/2015  . Status post bilateral knee replacements 12/13/2013  . Venous stasis ulcer of ankle (HCC) 06/06/2013  . ADD (attention deficit disorder) 04/04/2012  . INSOMNIA 06/20/2010  . HYPOTHYROIDISM, BORDERLINE 01/17/2010  . OBESITY 01/17/2010  . DEGENERATIVE JOINT DISEASE 01/17/2010  . ANEMIA 10/02/2009  . SINUSITIS, ACUTE 10/02/2009  . ASTHMA 10/02/2009  . Venous (peripheral) insufficiency 03/11/2008  . ALLERGIC RHINITIS 03/11/2008  . BRONCHITIS, RECURRENT 03/11/2008  . MIGRAINES, HX OF 03/11/2008  . ANXIETY 11/24/2007  . Hereditary and idiopathic peripheral neuropathy 11/24/2007  . GERD 11/24/2007  . IRRITABLE BOWEL SYNDROME 11/24/2007  . PSORIATIC ARTHROPATHY 11/24/2007    Daneli Butkiewicz PT, OCS 09/10/2015, 3:01 PM  [facility] [address]  [address] [place], [place], [address] Phone: [phone]   Fax:  [phone]  Name: Sarah Horton MRN: [id number] Date of Birth: March 06, 1951

## 2015-09-12 ENCOUNTER — Ambulatory Visit: Payer: Commercial Managed Care - HMO | Admitting: Physical Therapy

## 2015-09-12 DIAGNOSIS — M25511 Pain in right shoulder: Secondary | ICD-10-CM

## 2015-09-12 DIAGNOSIS — M545 Low back pain: Secondary | ICD-10-CM | POA: Diagnosis not present

## 2015-09-12 NOTE — Therapy (Signed)
[facility] [address]  [address] [place], [place], [address] Phone: [phone]   Fax:  [phone]  Physical Therapy Treatment  Patient Details  Name: Sarah Horton MRN: [id number] Date of Birth: [birth year] Referring Provider: Corliss Skainseveshwar  Encounter Date: 09/12/2015      PT End of Session - 09/12/15 1624    Visit Number 5   Number of Visits 12   Date for PT Re-Evaluation 10/09/15   PT Start Time 1543   PT Stop Time 1623   PT Time Calculation (min) 40 min      Past Medical History  Diagnosis Date  . Allergic rhinitis, cause unspecified   . Unspecified chronic bronchitis (HCC)   . Unspecified venous (peripheral) insufficiency   . Unspecified hypothyroidism   . Obesity, unspecified   . Esophageal reflux   . Irritable bowel syndrome   . Psoriatic arthropathy (HCC)   . Unspecified hereditary and idiopathic peripheral neuropathy   . Anxiety state, unspecified   . Anemia, unspecified   . Allergy   . Arthritis   . Cataract     bilaterally removed [id number]    Past Surgical History  Procedure Laterality Date  . Cataract extraction, bilateral      [id number]  . Nasal sinus surgery    . Abdominal hysterectomy    . Shoulder surgery  2005  . Bilat tkrs  04/2010    by [name]  . Joint replacement Bilateral     knees  . Bunionectomy  2009  . Dequervains tensenovitis Left   . Colonoscopy  08-26-2004    with gessner tics and hems   . Ablation on endometriosis      x2    There were no vitals filed for this visit.  Visit Diagnosis:  Pain in right shoulder  Shoulder weakness      Subjective Assessment - 09/12/15 1550    Subjective Pt continues to note severe R shoulder pain which extends down arm to hand.  States pain was up to 7-8/10 earlier today, took Tramadol and pain down to 3/10.  She states she has been performing shoulder HEP stating feels good to move shoulder and states doesn't seem to  increase her pain.   Currently in Pain? Yes   Pain Score 3  currently 3/10, 7-8/10 earlier today   Pain Location Shoulder   Pain Orientation Right        TODAY'S TREATMENT Manual - R GH gentle distraction with grade 2 AP and caudal glides along with PROM into ER and Flexion R ACJ grade 2 AP glides  4 strips kinesiotape to R Shoulder: 50% wrapping across delts and infraspin, 30% ant and post delt, 50% lateral delt and into supraspin  TherEx - R GH CW/CCW 0# 10x each then 2# 10x each B ER Yellow TB 12x           PT Short Term Goals - 09/10/15 1456    PT SHORT TERM GOAL #2   Title assess R Shoulder by 09/18/15 and update POC and LTG as necessary   Status Achieved           PT Long Term Goals - 09/12/15 1638    PT LONG TERM GOAL #1   Title pt independent with advanced HEP as necessary for LBP and shoulder pain by 10/09/15   Status Revised   PT LONG TERM GOAL #2   Title B LE MMT 4+/5 or better without pain by 10/09/15  Status On-going   PT LONG TERM GOAL #3   Title pt able to sit as necessary for work and travel without limitation by LBP by 10/09/15   Status On-going   PT LONG TERM GOAL #4   Title pt able to stand as necessary for shopping and ADLs/chores without limitation by LBP by 10/09/15   Status On-going   PT LONG TERM GOAL #5   Title R Shoulder AROM WFL by 10/09/15   Status On-going   PT LONG TERM GOAL #6   Title R Shoulder MMT 4/5 or better all planes by 10/09/15   Status On-going   PT LONG TERM GOAL #7   Title pt able to perform ADLs, chores, and most recreational activities without limitation by R shoulder pain by 10/09/15   Status On-going               Plan - 09/12/15 1634    Clinical Impression Statement Pt again with c/o severe R shoulder pain earlier today.  Pain increased from 7-8/10 to 3/10 with Tramadol.  She states her shoulder is her primary concern right now.  She states she is performing L-spine HEP and feels as though is able to control (no  improve) that pain to some extent; however, she states her R shoulder pain is limiting her function and ability to rest/sleep.  Trial of Kinesiotape to R shoulder today along with more manual therapy focus.   PT Next Visit Plan treat R shoulder with scapular stability as able; manual and modalities for pain (may try [place] or e-stim or combo in the future?); treat LBP with lumbopelvic stability training with mild flexion bias; B hip strengthening to tolerance; stretch B hip flexors and RF   Consulted and Agree with Plan of Care Patient        Problem List Patient Active Problem List   Diagnosis Date Noted  . Varicose veins of right lower extremity with complications 08/12/2015  . Elevated BP 08/09/2015  . Varicose veins of left lower extremity with complications 07/29/2015  . Varicose veins of bilateral lower extremities with other complications 07/09/2015  . Varicose veins of lower extremities with ulcer (HCC) 04/05/2015  . Status post bilateral knee replacements 12/13/2013  . Venous stasis ulcer of ankle (HCC) 06/06/2013  . ADD (attention deficit disorder) 04/04/2012  . INSOMNIA 06/20/2010  . HYPOTHYROIDISM, BORDERLINE 01/17/2010  . OBESITY 01/17/2010  . DEGENERATIVE JOINT DISEASE 01/17/2010  . ANEMIA 10/02/2009  . SINUSITIS, ACUTE 10/02/2009  . ASTHMA 10/02/2009  . Venous (peripheral) insufficiency 03/11/2008  . ALLERGIC RHINITIS 03/11/2008  . BRONCHITIS, RECURRENT 03/11/2008  . MIGRAINES, HX OF 03/11/2008  . ANXIETY 11/24/2007  . Hereditary and idiopathic peripheral neuropathy 11/24/2007  . GERD 11/24/2007  . IRRITABLE BOWEL SYNDROME 11/24/2007  . PSORIATIC ARTHROPATHY 11/24/2007    Tymel Conely PT, OCS 09/12/2015, 4:40 PM  [facility] [address]  [address] [place], [place], [address] Phone: [phone]   Fax:  [phone]  Name: Sarah Horton MRN: [id number] Date of Birth: 25-May-1951

## 2015-09-13 ENCOUNTER — Other Ambulatory Visit: Payer: Self-pay | Admitting: Pulmonary Disease

## 2015-09-16 NOTE — Telephone Encounter (Signed)
Per SN >> okay to refill #90 will 3 refills.  Rx has been called in. Nothing further was needed.

## 2015-09-16 NOTE — Telephone Encounter (Signed)
Received refill request for Tramadol 50mg  tabs (1 tab TID prn) Last filled on 06/28/15 for #90 with 0 refills.  Pt last seen in office by TP on 08/08/2015.   Dr. Kriste BasqueNadel, please advise on refill. Thanks.   Allergies  Allergen Reactions  . Nucynta [Tapentadol]     Hives  . Codeine     REACTION: severe nausea by itself  . Penicillins     REACTION: hives all over    Current Outpatient Prescriptions on File Prior to Visit  Medication Sig Dispense Refill  . calcium carbonate (OS-CAL) 600 MG TABS Take 600 mg by mouth 2 (two) times daily with a meal.      . cetirizine (ZYRTEC) 10 MG tablet Take 10 mg by mouth daily.    Marland Kitchen. etanercept (ENBREL) 50 MG/ML injection Inject 50 mg into the skin once a week. Reported on 08/28/2015    . famotidine (PEPCID) 10 MG tablet Take 20 mg by mouth daily.     Marland Kitchen. lactobacillus acidophilus (BACID) TABS tablet Take 1 tablet by mouth daily.    Marland Kitchen. levothyroxine (SYNTHROID, LEVOTHROID) 50 MCG tablet Take 75 mcg by mouth daily.     Marland Kitchen. MAGNESIUM PO Take 750 mg by mouth daily.    . methocarbamol (ROBAXIN) 500 MG tablet 1-2 tablets by mouth daily as needed     . montelukast (SINGULAIR) 10 MG tablet Take 10 mg by mouth at bedtime.     . Multiple Vitamin (MULTIVITAMIN) tablet Take 1 tablet by mouth daily.      . Naproxen Sodium (ALEVE PO) Take 2 tablets by mouth 2 (two) times daily before a meal.    . promethazine (PHENERGAN) 25 MG tablet TAKE 1 TABLET (25 MG TOTAL) BY MOUTH EVERY 6 (SIX) HOURS AS NEEDED FOR NAUSEA. 30 tablet 0  . SUMAtriptan (IMITREX) 20 MG/ACT nasal spray Place 1 spray (20 mg total) into the nose every 2 (two) hours as needed. 1 Inhaler 5  . traMADol (ULTRAM) 50 MG tablet Take 1 tablet (50 mg total) by mouth 3 (three) times daily as needed. 90 tablet 0   No current facility-administered medications on file prior to visit.

## 2015-09-17 ENCOUNTER — Ambulatory Visit: Payer: Commercial Managed Care - HMO

## 2015-09-17 DIAGNOSIS — M545 Low back pain: Secondary | ICD-10-CM | POA: Diagnosis not present

## 2015-09-17 DIAGNOSIS — M25511 Pain in right shoulder: Secondary | ICD-10-CM

## 2015-09-17 NOTE — Therapy (Signed)
[facility] [address]  [address] [place], [place], [address] Phone: [phone]   Fax:  [phone]  Physical Therapy Treatment  Patient Details  Name: Sarah Horton MRN: [id number] Date of Birth: [birth year] Referring Provider: Corliss Skains  Encounter Date: 09/17/2015      PT End of Session - 09/17/15 1553    Visit Number 6   Number of Visits 12   Date for PT Re-Evaluation 10/09/15   PT Start Time 1535   PT Stop Time 1615   PT Time Calculation (min) 40 min   Activity Tolerance Patient tolerated treatment well   Behavior During Therapy [facility] for tasks assessed/performed      Past Medical History  Diagnosis Date  . Allergic rhinitis, cause unspecified   . Unspecified chronic bronchitis (HCC)   . Unspecified venous (peripheral) insufficiency   . Unspecified hypothyroidism   . Obesity, unspecified   . Esophageal reflux   . Irritable bowel syndrome   . Psoriatic arthropathy (HCC)   . Unspecified hereditary and idiopathic peripheral neuropathy   . Anxiety state, unspecified   . Anemia, unspecified   . Allergy   . Arthritis   . Cataract     bilaterally removed [id number]    Past Surgical History  Procedure Laterality Date  . Cataract extraction, bilateral      [id number]  . Nasal sinus surgery    . Abdominal hysterectomy    . Shoulder surgery  2005  . Bilat tkrs  04/2010    by [name]  . Joint replacement Bilateral     knees  . Bunionectomy  2009  . Dequervains tensenovitis Left   . Colonoscopy  08-26-2004    with gessner tics and hems   . Ablation on endometriosis      x2    There were no vitals filed for this visit.      Subjective Assessment - 09/17/15 1543    Subjective Pt. reports a 3/10 R shoulder pain which continues to extend down arm to hand.  Pt. reports not taking tramadol today and being pleased that R shoulder pain is this low without medication.    Currently in Pain? Yes   Pain  Score 3    Pain Location Shoulder   Pain Orientation Right   Pain Descriptors / Indicators Sharp;Aching;Dull   Pain Type Acute pain        TODAY'S TREATMENT: Therex: UBE: 2 min forward / backward no resistance  Manual: R GH gentle distraction with grade 2 AP and caudal glides PROM into ER, IR, flexion, abduction   TherEx:  Supine R GH CW/CCW 1# x 15 each way  B shoulder flexion with cane 2# cuffweight x 10 reps; 2# cuffweight removed after report of L shoulder pain x 15 reps with no weight Supine B ER red TB x 15 reps Supine R shoulder protraction with 2# x 15 reps  Supine R shoulder flexion (0-90dg) with 1# x 15 reps Standing B shoulder retraction row with green TB x 15 reps  Standing B shoulder with retraction/extension with red TB x 15 reps        PT Short Term Goals - 09/10/15 1456    PT SHORT TERM GOAL #2   Title assess R Shoulder by 09/18/15 and update POC and LTG as necessary   Status Achieved           PT Long Term Goals - 09/12/15 1638    PT LONG TERM GOAL #  1   Title pt independent with advanced HEP as necessary for LBP and shoulder pain by 10/09/15   Status Revised   PT LONG TERM GOAL #2   Title B LE MMT 4+/5 or better without pain by 10/09/15   Status On-going   PT LONG TERM GOAL #3   Title pt able to sit as necessary for work and travel without limitation by LBP by 10/09/15   Status On-going   PT LONG TERM GOAL #4   Title pt able to stand as necessary for shopping and ADLs/chores without limitation by LBP by 10/09/15   Status On-going   PT LONG TERM GOAL #5   Title R Shoulder AROM WFL by 10/09/15   Status On-going   PT LONG TERM GOAL #6   Title R Shoulder MMT 4/5 or better all planes by 10/09/15   Status On-going   PT LONG TERM GOAL #7   Title pt able to perform ADLs, chores, and most recreational activities without limitation by R shoulder pain by 10/09/15   Status On-going               Plan - 09/17/15 1553    Clinical Impression Statement Pt.  with 3/10 R shoulder pain initially today however reports not having to take Tramadol today.  Pt. able to perform AAROM / AROM at R shoulder with low resistance without pain however reported increased pain and "heaviness" with moderate resistance R shoulder flexion, abduction, ER activities.  Pt. tolerated all scapular strengthening well with exception of TB resisted end range shoulder extension; pt. instructed to stop short of pain with all strengthening activity.     PT Treatment/Interventions Manual techniques;Therapeutic exercise;Therapeutic activities;Traction;Dry needling;Taping;Patient/family education;Balance training;Functional mobility training;Cryotherapy;Electrical Stimulation;Moist Heat   PT Next Visit Plan treat R shoulder with scapular stability as able; manual and modalities for pain (may try [place] or e-stim or combo in the future?); treat LBP with lumbopelvic stability training with mild flexion bias; B hip strengthening to tolerance; stretch B hip flexors and RF   Consulted and Agree with Plan of Care Patient      Patient will benefit from skilled therapeutic intervention in order to improve the following deficits and impairments:  Pain, Postural dysfunction, Improper body mechanics, Decreased strength, Decreased range of motion, Impaired flexibility, Impaired UE functional use  Visit Diagnosis: Pain in right shoulder     Problem List Patient Active Problem List   Diagnosis Date Noted  . Varicose veins of right lower extremity with complications 08/12/2015  . Elevated BP 08/09/2015  . Varicose veins of left lower extremity with complications 07/29/2015  . Varicose veins of bilateral lower extremities with other complications 07/09/2015  . Varicose veins of lower extremities with ulcer (HCC) 04/05/2015  . Status post bilateral knee replacements 12/13/2013  . Venous stasis ulcer of ankle (HCC) 06/06/2013  . ADD (attention deficit disorder) 04/04/2012  . INSOMNIA 06/20/2010  .  HYPOTHYROIDISM, BORDERLINE 01/17/2010  . OBESITY 01/17/2010  . DEGENERATIVE JOINT DISEASE 01/17/2010  . ANEMIA 10/02/2009  . SINUSITIS, ACUTE 10/02/2009  . ASTHMA 10/02/2009  . Venous (peripheral) insufficiency 03/11/2008  . ALLERGIC RHINITIS 03/11/2008  . BRONCHITIS, RECURRENT 03/11/2008  . MIGRAINES, HX OF 03/11/2008  . ANXIETY 11/24/2007  . Hereditary and idiopathic peripheral neuropathy 11/24/2007  . GERD 11/24/2007  . IRRITABLE BOWEL SYNDROME 11/24/2007  . PSORIATIC ARTHROPATHY 11/24/2007    Kermit Balo, PTA 09/17/2015, 5:57 PM  [facility] [address]  [phone]  [place], [place], [address] Phone: [phone]   Fax:  [phone]  Name: Lawerance BachCatherine M Garlock MRN: [id number] Date of Birth: 31-Jul-1950

## 2015-09-19 ENCOUNTER — Ambulatory Visit: Payer: Commercial Managed Care - HMO

## 2015-09-19 DIAGNOSIS — M25511 Pain in right shoulder: Secondary | ICD-10-CM

## 2015-09-19 DIAGNOSIS — M545 Low back pain: Secondary | ICD-10-CM | POA: Diagnosis not present

## 2015-09-19 NOTE — Therapy (Signed)
[facility] [address]  [address] [place], [place], [address] Phone: [phone]   Fax:  [phone]  Physical Therapy Treatment  Patient Details  Name: Sarah Horton MRN: [id number] Date of Birth: 1950/07/04 Referring Provider: Corliss Skains  Encounter Date: 09/19/2015      PT End of Session - 09/19/15 1625    Visit Number 7   Number of Visits 12   Date for PT Re-Evaluation 10/09/15   PT Start Time 1537   PT Stop Time 1620   PT Time Calculation (min) 43 min   Activity Tolerance Patient tolerated treatment well   Behavior During Therapy [facility] for tasks assessed/performed      Past Medical History  Diagnosis Date  . Allergic rhinitis, cause unspecified   . Unspecified chronic bronchitis (HCC)   . Unspecified venous (peripheral) insufficiency   . Unspecified hypothyroidism   . Obesity, unspecified   . Esophageal reflux   . Irritable bowel syndrome   . Psoriatic arthropathy (HCC)   . Unspecified hereditary and idiopathic peripheral neuropathy   . Anxiety state, unspecified   . Anemia, unspecified   . Allergy   . Arthritis   . Cataract     bilaterally removed [id number]    Past Surgical History  Procedure Laterality Date  . Cataract extraction, bilateral      [id number]  . Nasal sinus surgery    . Abdominal hysterectomy    . Shoulder surgery  2005  . Bilat tkrs  04/2010    by [name]  . Joint replacement Bilateral     knees  . Bunionectomy  2009  . Dequervains tensenovitis Left   . Colonoscopy  08-26-2004    with gessner tics and hems   . Ablation on endometriosis      x2    There were no vitals filed for this visit.      Subjective Assessment - 09/19/15 1541    Subjective Pt. reports she isn't currenlty in pain and hasn't felt "any" pain since the day before last treatment.   Currently in Pain? No/denies   Pain Score 0-No pain   Multiple Pain Sites No            [facility] PT Assessment -  09/19/15 1545    AROM   Right Shoulder Flexion 163 Degrees   Right Shoulder ABduction 137 Degrees   Right Shoulder Internal Rotation 73 Degrees   Right Shoulder External Rotation 68 Degrees      TODAY'S TREATMENT:  TherEx:  UBE: 90" forward / backward, 1.5  Supine R GH CW/CCW 3# x 15 each way  AROM B shoulder flexion with cane without weight x 10 reps Supine B ER red TB x 15 reps Supine R shoulder protraction with 3# x 15 reps  Seated R shoulder flexion (0-90dg) with no weight x 15 reps Seated R shoulder abduction (0-90dg) with no weight x 15 reps Standing B shoulder retraction row with blue TB 2 x 15 reps   Standing B shoulder with retraction/extension with red TB 2 x 15 reps On 6" foam bolster:       Scaption with #1 dumbells x 15 reps       Horizontal abduction with green TB (low) x 10 reps; performed in a low plane as not to over work the R shoulder AROM R shoulder flexion on big red physioball x 15 reps (stretching into flexion)         PT Short Term Goals -  09/10/15 1456    PT SHORT TERM GOAL #2   Title assess R Shoulder by 09/18/15 and update POC and LTG as necessary   Status Achieved           PT Long Term Goals - 09/12/15 1638    PT LONG TERM GOAL #1   Title pt independent with advanced HEP as necessary for LBP and shoulder pain by 10/09/15   Status Revised   PT LONG TERM GOAL #2   Title B LE MMT 4+/5 or better without pain by 10/09/15   Status On-going   PT LONG TERM GOAL #3   Title pt able to sit as necessary for work and travel without limitation by LBP by 10/09/15   Status On-going   PT LONG TERM GOAL #4   Title pt able to stand as necessary for shopping and ADLs/chores without limitation by LBP by 10/09/15   Status On-going   PT LONG TERM GOAL #5   Title R Shoulder AROM WFL by 10/09/15   Status On-going   PT LONG TERM GOAL #6   Title R Shoulder MMT 4/5 or better all planes by 10/09/15   Status On-going   PT LONG TERM GOAL #7   Title pt able to perform  ADLs, chores, and most recreational activities without limitation by R shoulder pain by 10/09/15   Status On-going               Plan - 09/19/15 1626    Clinical Impression Statement Pt. with no R shoulder pain initially today however reports taking muscle relaxer this morning.  Pt. R shoulder AROM improved well today: flexion 163 dg, abd. 137 dg, IR 73 dg, ER 68 dg.  Pt. tolerated advancement with scapular strengthening and shoulder strengthening wel with no pain and only minimal fatigue notes with R shoulder flexion.     Clinical Impairments Affecting Rehab Potential obese, psoriatic arthritis, sedentary   PT Treatment/Interventions Manual techniques;Therapeutic exercise;Therapeutic activities;Traction;Dry needling;Taping;Patient/family education;Balance training;Functional mobility training;Cryotherapy;Electrical Stimulation;Moist Heat   PT Next Visit Plan treat R shoulder with scapular stability as able; manual and modalities for pain (may try [place] or e-stim or combo in the future?); treat LBP with lumbopelvic stability training with mild flexion bias; B hip strengthening to tolerance; stretch B hip flexors and RF      Patient will benefit from skilled therapeutic intervention in order to improve the following deficits and impairments:  Pain, Postural dysfunction, Improper body mechanics, Decreased strength, Decreased range of motion, Impaired flexibility, Impaired UE functional use  Visit Diagnosis: Pain in right shoulder     Problem List Patient Active Problem List   Diagnosis Date Noted  . Varicose veins of right lower extremity with complications 08/12/2015  . Elevated BP 08/09/2015  . Varicose veins of left lower extremity with complications 07/29/2015  . Varicose veins of bilateral lower extremities with other complications 07/09/2015  . Varicose veins of lower extremities with ulcer (HCC) 04/05/2015  . Status post bilateral knee replacements 12/13/2013  . Venous stasis  ulcer of ankle (HCC) 06/06/2013  . ADD (attention deficit disorder) 04/04/2012  . INSOMNIA 06/20/2010  . HYPOTHYROIDISM, BORDERLINE 01/17/2010  . OBESITY 01/17/2010  . DEGENERATIVE JOINT DISEASE 01/17/2010  . ANEMIA 10/02/2009  . SINUSITIS, ACUTE 10/02/2009  . ASTHMA 10/02/2009  . Venous (peripheral) insufficiency 03/11/2008  . ALLERGIC RHINITIS 03/11/2008  . BRONCHITIS, RECURRENT 03/11/2008  . MIGRAINES, HX OF 03/11/2008  . ANXIETY 11/24/2007  . Hereditary and idiopathic peripheral neuropathy 11/24/2007  .  GERD 11/24/2007  . IRRITABLE BOWEL SYNDROME 11/24/2007  . PSORIATIC ARTHROPATHY 11/24/2007    Kermit BaloMicah Rulon Abdalla, PTA 09/19/2015, 4:45 PM  [facility] [address]  [address] [place], [place], [address] Phone: [phone]   Fax:  [phone]  Name: Lawerance BachCatherine M Palin MRN: [id number] Date of Birth: 1950/08/10

## 2015-09-23 ENCOUNTER — Ambulatory Visit: Payer: Commercial Managed Care - HMO

## 2015-09-23 DIAGNOSIS — M25511 Pain in right shoulder: Secondary | ICD-10-CM

## 2015-09-23 DIAGNOSIS — M545 Low back pain: Secondary | ICD-10-CM | POA: Diagnosis not present

## 2015-09-23 NOTE — Therapy (Signed)
[facility] [address]  [address] [place], [place], [address] Phone: [phone]   Fax:  [phone]  Physical Therapy Treatment  Patient Details  Name: Sarah Horton MRN: [id number] Date of Birth: 01/17/1951 Referring Provider: Corliss Skainseveshwar  Encounter Date: 09/23/2015      PT End of Session - 09/23/15 1545    Visit Number 8   Number of Visits 12   Date for PT Re-Evaluation 10/09/15   PT Start Time 1537   PT Stop Time 1618   PT Time Calculation (min) 41 min   Activity Tolerance Patient tolerated treatment well   Behavior [facility] for tasks assessed/performed      Past Medical History  Diagnosis Date  . Allergic rhinitis, cause unspecified   . Unspecified chronic bronchitis (HCC)   . Unspecified venous (peripheral) insufficiency   . Unspecified hypothyroidism   . Obesity, unspecified   . Esophageal reflux   . Irritable bowel syndrome   . Psoriatic arthropathy (HCC)   . Unspecified hereditary and idiopathic peripheral neuropathy   . Anxiety state, unspecified   . Anemia, unspecified   . Allergy   . Arthritis   . Cataract     bilaterally removed [id number]    Past Surgical History  Procedure Laterality Date  . Cataract extraction, bilateral      [id number]  . Nasal sinus surgery    . Abdominal hysterectomy    . Shoulder surgery  2005  . Bilat tkrs  04/2010    by [name]  . Joint replacement Bilateral     knees  . Bunionectomy  2009  . Dequervains tensenovitis Left   . Colonoscopy  08-26-2004    with gessner tics and hems   . Ablation on endometriosis      x2    There were no vitals filed for this visit.      Subjective Assessment - 09/23/15 1627    Subjective Pt. reports she cleaned out her closet yesterday and R shoulder feels tired as a result with slight increase in pain today.    Currently in Pain? Yes   Pain Score 2    Pain Location Shoulder   Pain Orientation Right   Pain Descriptors / Indicators Aching;Dull   Pain Type Acute pain   Pain Onset More than a month ago      TODAY'S TREATMENT:  TherEx:  UBE: 2' forward / backward, 2.0  Manual:  R shoulder Inferior, posterior grade 3  PROM R shoulder flexion, IR/ER, and abduction   AROM B shoulder flexion with cane without weight x 10 reps Supine B ER red TB x 15 reps Supine R shoulder protraction with 4# x 15 reps  Standing B shoulder retraction row with blue TB 2 x 15 reps    Standing B shoulder with retraction/extension with green TB 2 x 15 reps BATCA pulldown 20# x 15 reps  BATCA low row 20 # x 15 reps On 6" foam bolster:  Scaption with #1 dumbells x 15 reps  Horizontal abduction with green TB (low) x 15 reps; performed in a low plane as not to over work the R shoulder         PT Education - 09/23/15 1636    Education Details green TB given to pt. for retraction, and retraction/shoulder extension activities   Person(s) Educated Patient   Methods Explanation;Demonstration;Handout   Comprehension Verbalized understanding          PT Short Term  Goals - 09/10/15 1456    PT SHORT TERM GOAL #2   Title assess R Shoulder by 09/18/15 and update POC and LTG as necessary   Status Achieved           PT Long Term Goals - 09/12/15 1638    PT LONG TERM GOAL #1   Title pt independent with advanced HEP as necessary for LBP and shoulder pain by 10/09/15   Status Revised   PT LONG TERM GOAL #2   Title B LE MMT 4+/5 or better without pain by 10/09/15   Status On-going   PT LONG TERM GOAL #3   Title pt able to sit as necessary for work and travel without limitation by LBP by 10/09/15   Status On-going   PT LONG TERM GOAL #4   Title pt able to stand as necessary for shopping and ADLs/chores without limitation by LBP by 10/09/15   Status On-going   PT LONG TERM GOAL #5   Title R Shoulder AROM WFL by 10/09/15   Status On-going   PT LONG TERM GOAL #6   Title R Shoulder MMT 4/5 or better  all planes by 10/09/15   Status On-going   PT LONG TERM GOAL #7   Title pt able to perform ADLs, chores, and most recreational activities without limitation by R shoulder pain by 10/09/15   Status On-going               Plan - 09/23/15 1545    Clinical Impression Statement Pt. performed well with scapular strengthening activity today tolerating increase in TB resistance with retraction type activities.  Pt. tolerating increased reps and resistance with shoulder strengthening with 1# dumbbell raise into scaption plane to 90 dg without pain; low resistance with ER tolerated also with no pain.  Pt. R shoulder AROM continues to improve however not measured today due to time constraints.     Clinical Impairments Affecting Rehab Potential obese, psoriatic arthritis, sedentary   PT Treatment/Interventions Manual techniques;Therapeutic exercise;Therapeutic activities;Traction;Dry needling;Taping;Patient/family education;Balance training;Functional mobility training;Cryotherapy;Electrical Stimulation;Moist Heat   PT Next Visit Plan treat R shoulder with scapular stability as able; manual and modalities for pain (may try [place] or e-stim or combo in the future?); treat LBP with lumbopelvic stability training with mild flexion bias; B hip strengthening to tolerance; stretch B hip flexors and RF      Patient will benefit from skilled therapeutic intervention in order to improve the following deficits and impairments:  Pain, Postural dysfunction, Improper body mechanics, Decreased strength, Decreased range of motion, Impaired flexibility, Impaired UE functional use  Visit Diagnosis: Pain in right shoulder     Problem List Patient Active Problem List   Diagnosis Date Noted  . Varicose veins of right lower extremity with complications 08/12/2015  . Elevated BP 08/09/2015  . Varicose veins of left lower extremity with complications 07/29/2015  . Varicose veins of bilateral lower extremities with other  complications 07/09/2015  . Varicose veins of lower extremities with ulcer (HCC) 04/05/2015  . Status post bilateral knee replacements 12/13/2013  . Venous stasis ulcer of ankle (HCC) 06/06/2013  . ADD (attention deficit disorder) 04/04/2012  . INSOMNIA 06/20/2010  . HYPOTHYROIDISM, BORDERLINE 01/17/2010  . OBESITY 01/17/2010  . DEGENERATIVE JOINT DISEASE 01/17/2010  . ANEMIA 10/02/2009  . SINUSITIS, ACUTE 10/02/2009  . ASTHMA 10/02/2009  . Venous (peripheral) insufficiency 03/11/2008  . ALLERGIC RHINITIS 03/11/2008  . BRONCHITIS, RECURRENT 03/11/2008  . MIGRAINES, HX OF 03/11/2008  . ANXIETY 11/24/2007  .  Hereditary and idiopathic peripheral neuropathy 11/24/2007  . GERD 11/24/2007  . IRRITABLE BOWEL SYNDROME 11/24/2007  . PSORIATIC ARTHROPATHY 11/24/2007    Sarah Horton, Sarah Horton  09/23/2015, 4:37 PM  [facility] [address]  [address] [place], [place], [address] Phone: [phone]   Fax:  [phone]  Name: Sarah Horton MRN: [id number] Date of Birth: 1951/04/23

## 2015-09-26 ENCOUNTER — Ambulatory Visit: Payer: Commercial Managed Care - HMO | Admitting: Physical Therapy

## 2015-09-30 ENCOUNTER — Ambulatory Visit: Payer: Commercial Managed Care - HMO

## 2015-10-01 ENCOUNTER — Ambulatory Visit: Payer: Commercial Managed Care - HMO

## 2015-10-01 DIAGNOSIS — M25511 Pain in right shoulder: Secondary | ICD-10-CM

## 2015-10-01 DIAGNOSIS — M545 Low back pain: Secondary | ICD-10-CM | POA: Diagnosis not present

## 2015-10-01 NOTE — Therapy (Signed)
[facility] [address]  [place], [place], [address] Phone: [phone]   Fax:  [phone]  Physical Therapy Treatment  Patient Details  Name: Sarah Horton MRN: [id number] Date of Birth: 11/24/1950 Referring Provider: Estanislado Pandy  Encounter Date: 10/01/2015      PT End of Session - 10/01/15 1544    Visit Number 9   Number of Visits 12   Date for PT Re-Evaluation 10/09/15   PT Start Time 3846   PT Stop Time 1620   PT Time Calculation (min) 39 min   Activity Tolerance Patient tolerated treatment well   Behavior During Therapy [facility] for tasks assessed/performed      Past Medical History  Diagnosis Date  . Allergic rhinitis, cause unspecified   . Unspecified chronic bronchitis ([place])   . Unspecified venous (peripheral) insufficiency   . Unspecified hypothyroidism   . Obesity, unspecified   . Esophageal reflux   . Irritable bowel syndrome   . Psoriatic arthropathy ([place])   . Unspecified hereditary and idiopathic peripheral neuropathy   . Anxiety state, unspecified   . Anemia, unspecified   . Allergy   . Arthritis   . Cataract     bilaterally removed [id number]    Past Surgical History  Procedure Laterality Date  . Cataract extraction, bilateral      [id number]  . Nasal sinus surgery    . Abdominal hysterectomy    . Shoulder surgery  2005  . Bilat tkrs  04/2010    by [name]  . Joint replacement Bilateral     knees  . Bunionectomy  2009  . Dequervains tensenovitis Left   . Colonoscopy  08-26-2004    with gessner tics and hems   . Ablation on endometriosis      x2    There were no vitals filed for this visit.      Subjective Assessment - 10/01/15 1609    Subjective Pt. reports R shoulder is pain free and, "feeling good these days".   Currently in Pain? No/denies   Pain Score 0-No pain   Multiple Pain Sites No      TODAY'S TREATMENT:  TherEx:  UBE: 2' forward / backward,  2.0  Manual:  AROM shoulder flexion with wand and 2# cuffweight x 15 reps Supine R shoulder protraction with 5# x 15 reps  Standing B shoulder retraction row with green TB  x 15 reps   Standing B shoulder with retraction/extension with green TB  x 15 reps BATCA pulldown 20# x 15 reps  BATCA low row 25 # x 15 reps BATCA low row single arm 15# x 15 reps  On 6" foam bolster:  Scaption with #2 dumbells x 10 reps  Horizontal abduction with green TB (low) x 15 reps; performed in a low plane as not to over work the R shoulder Single arm cable row in staggered stance #15 x 10 reps each way    Goal assessment        PT Short Term Goals - 09/10/15 1456    PT SHORT TERM GOAL #2   Title assess R Shoulder by 09/18/15 and update POC and LTG as necessary   Status Achieved           PT Long Term Goals - 10/01/15 1547    PT LONG TERM GOAL #1   Title pt independent with advanced HEP as necessary for LBP and shoulder pain by 10/09/15   Status Achieved  10/01/15: Pt. able to demo/verbalized independence with advanced HEP for LBP and shoulder pain.     PT LONG TERM GOAL #2   Title B LE MMT 4+/5 or better without pain by 10/09/15   Status On-going   PT LONG TERM GOAL #3   Title pt able to sit as necessary for work and travel without limitation by LBP by 10/09/15   Status Partially Met  10/01/15: Pt. reports she isn't limited by LBP on the average work day however pt. reports some days she is limited by LBP while traveling.     PT LONG TERM GOAL #4   Title pt able to stand as necessary for shopping and ADLs/chores without limitation by LBP by 10/09/15   Status Partially Met  10/01/15: Pt. able to stand as necessary for shopping and ADL/chores without limitation with exception of standing still cooking for over 1 hr.     PT LONG TERM GOAL #5   Title R Shoulder AROM WFL by 10/09/15   Status On-going   PT LONG TERM GOAL #6   Title R Shoulder MMT 4/5 or better all planes by 10/09/15   Status  On-going   PT LONG TERM GOAL #7   Title pt able to perform ADLs, chores, and most recreational activities without limitation by R shoulder pain by 10/09/15   Status Achieved  10/01/15: Pt. able to perform ADLs, chores, and most recreational activities without limitation by R shoulder pain.                 Plan - 10/01/15 1545    Clinical Impression Statement Pt. performed well with all scapular strengthening and shoulder strengthening activity today with no pain increase.  Pt. AROM strength continues to improve at R shoulder tolerating progression to 2# shoulder raise in scaption and flexion well today.  Pt. reports LBP only limits her with work while traveling with long car rides at this point; LBP only bothers her with house work / ADL's / chores while standing for longer than 1 hr.     Clinical Impairments Affecting Rehab Potential obese, psoriatic arthritis, sedentary   PT Treatment/Interventions Manual techniques;Therapeutic exercise;Therapeutic activities;Traction;Dry needling;Taping;Patient/family education;Balance training;Functional mobility training;Cryotherapy;Electrical Stimulation;Moist Heat   PT Next Visit Plan treat R shoulder with scapular stability as able; manual and modalities for pain (may try [place] or e-stim or combo in the future?); treat LBP with lumbopelvic stability training with mild flexion bias; B hip strengthening to tolerance; stretch B hip flexors and RF      Patient will benefit from skilled therapeutic intervention in order to improve the following deficits and impairments:  Pain, Postural dysfunction, Improper body mechanics, Decreased strength, Decreased range of motion, Impaired flexibility, Impaired UE functional use  Visit Diagnosis: Pain in right shoulder     Problem List Patient Active Problem List   Diagnosis Date Noted  . Varicose veins of right lower extremity with complications 40/98/1191  . Elevated BP 08/09/2015  . Varicose veins of left  lower extremity with complications 47/82/9562  . Varicose veins of bilateral lower extremities with other complications 13/01/6577  . Varicose veins of lower extremities with ulcer ([place]) 04/05/2015  . Status post bilateral knee replacements 12/13/2013  . Venous stasis ulcer of ankle ([place]) 06/06/2013  . ADD (attention deficit disorder) 04/04/2012  . INSOMNIA 06/20/2010  . HYPOTHYROIDISM, BORDERLINE 01/17/2010  . OBESITY 01/17/2010  . DEGENERATIVE JOINT DISEASE 01/17/2010  . ANEMIA 10/02/2009  . SINUSITIS, ACUTE 10/02/2009  . ASTHMA 10/02/2009  .  Venous (peripheral) insufficiency 03/11/2008  . ALLERGIC RHINITIS 03/11/2008  . BRONCHITIS, RECURRENT 03/11/2008  . MIGRAINES, HX OF 03/11/2008  . ANXIETY 11/24/2007  . Hereditary and idiopathic peripheral neuropathy 11/24/2007  . GERD 11/24/2007  . IRRITABLE BOWEL SYNDROME 11/24/2007  . PSORIATIC ARTHROPATHY 11/24/2007    Bess Harvest, PTA 10/02/2015, 3:49 PM  [facility] [address]  [place], [place], [address] Phone: [phone]   Fax:  [phone]  Name: ELLEIGH CASSETTA MRN: [id number] Date of Birth: [birth year]

## 2015-10-03 ENCOUNTER — Ambulatory Visit: Payer: Commercial Managed Care - HMO | Admitting: Physical Therapy

## 2015-10-03 DIAGNOSIS — M545 Low back pain, unspecified: Secondary | ICD-10-CM

## 2015-10-03 NOTE — Therapy (Signed)
[facility] [address]  [place], [place], [address] Phone: [phone]   Fax:  [phone]  Physical Therapy Treatment  Patient Details  Name: Sarah Horton MRN: [id number] Date of Birth: 01-03-1951 Referring Provider: Estanislado Pandy  Encounter Date: 10/03/2015      PT End of Session - 10/03/15 1544    Visit Number 10   Number of Visits 12   Date for PT Re-Evaluation 10/09/15   PT Start Time 9937  pt late   PT Stop Time 1616   PT Time Calculation (min) 33 min      Past Medical History  Diagnosis Date  . Allergic rhinitis, cause unspecified   . Unspecified chronic bronchitis ([place])   . Unspecified venous (peripheral) insufficiency   . Unspecified hypothyroidism   . Obesity, unspecified   . Esophageal reflux   . Irritable bowel syndrome   . Psoriatic arthropathy ([place])   . Unspecified hereditary and idiopathic peripheral neuropathy   . Anxiety state, unspecified   . Anemia, unspecified   . Allergy   . Arthritis   . Cataract     bilaterally removed [id number]    Past Surgical History  Procedure Laterality Date  . Cataract extraction, bilateral      [id number]  . Nasal sinus surgery    . Abdominal hysterectomy    . Shoulder surgery  2005  . Bilat tkrs  04/2010    by [name]  . Joint replacement Bilateral     knees  . Bunionectomy  2009  . Dequervains tensenovitis Left   . Colonoscopy  08-26-2004    with gessner tics and hems   . Ablation on endometriosis      x2    There were no vitals filed for this visit.      Subjective Assessment - 10/03/15 1545    Subjective States shoulder continues to feel good and back also improved.  States shoulder is basically pain-free and LBP 2/10 currently.  States LBP was up to 6/10 yesterday during prolonged sitting but states got up and did some stretching and pain down to 2-3/10 after this.   Currently in Pain? Yes   Pain Score 2    Pain Location  Back   Pain Orientation Lower   Pain Score 0   Pain Location Shoulder   Pain Orientation Right          TODAY'S TREATMENT TherEx - LTR 1' Hooklying LE March 10x each Pelvic Tilt 10x3" Stretch B HS, PIri, B LE Nerve glides Bridge 10x Bridge with Unilateral Hip ABD Black TB at knees 2x5 each Bridge with Ball between knees for Hip ADD isometric 5x5" Prone Knee flexion stretch with contract/relax (much tighter on L, both improved with contract/relax without c/o LBP) Prone SLR 10x each            PT Short Term Goals - 09/10/15 1456    PT SHORT TERM GOAL #2   Title assess R Shoulder by 09/18/15 and update POC and LTG as necessary   Status Achieved           PT Long Term Goals - 10/01/15 1547    PT LONG TERM GOAL #1   Title pt independent with advanced HEP as necessary for LBP and shoulder pain by 10/09/15   Status Achieved  10/01/15: Pt. able to demo/verbalized independence with advanced HEP for LBP and shoulder pain.     PT LONG TERM GOAL #2   Title  B LE MMT 4+/5 or better without pain by 10/09/15   Status On-going   PT LONG TERM GOAL #3   Title pt able to sit as necessary for work and travel without limitation by LBP by 10/09/15   Status Partially Met  10/01/15: Pt. reports she isn't limited by LBP on the average work day however pt. reports some days she is limited by LBP while traveling.     PT LONG TERM GOAL #4   Title pt able to stand as necessary for shopping and ADLs/chores without limitation by LBP by 10/09/15   Status Partially Met  10/01/15: Pt. able to stand as necessary for shopping and ADL/chores without limitation with exception of standing still cooking for over 1 hr.     PT LONG TERM GOAL #5   Title R Shoulder AROM WFL by 10/09/15   Status On-going   PT LONG TERM GOAL #6   Title R Shoulder MMT 4/5 or better all planes by 10/09/15   Status On-going   PT LONG TERM GOAL #7   Title pt able to perform ADLs, chores, and most recreational activities without  limitation by R shoulder pain by 10/09/15   Status Achieved  10/01/15: Pt. able to perform ADLs, chores, and most recreational activities without limitation by R shoulder pain.                 Plan - 10/03/15 1622    Clinical Impression Statement pt states shoulder seems fine and is "very grateful" for the amount of improvement.  Her LBP has improved as well but states up to 6/10 yesterday with prolonged sitting at work.  Able to decrease to 2-3/10 with some movment/stretching and is only 2/10 today.  Whereas prone knee flexion is still tight, it no longer produces LBP.  She has 2 visits remaining on POC and want to progress HEP for LBP in that period.  She may be ready for discharge by 12th visit but will assess at that time.   PT Next Visit Plan progress HEP for trunk stability along with hip strengthening exercises; include quad stretches and SKTC   Consulted and Agree with Plan of Care Patient      Patient will benefit from skilled therapeutic intervention in order to improve the following deficits and impairments:  Pain, Postural dysfunction, Improper body mechanics, Decreased strength, Decreased range of motion, Impaired flexibility, Impaired UE functional use  Visit Diagnosis: Bilateral low back pain without sciatica     Problem List Patient Active Problem List   Diagnosis Date Noted  . Varicose veins of right lower extremity with complications 92/42/6834  . Elevated BP 08/09/2015  . Varicose veins of left lower extremity with complications 19/62/2297  . Varicose veins of bilateral lower extremities with other complications 98/92/1194  . Varicose veins of lower extremities with ulcer ([place]) 04/05/2015  . Status post bilateral knee replacements 12/13/2013  . Venous stasis ulcer of ankle ([place]) 06/06/2013  . ADD (attention deficit disorder) 04/04/2012  . INSOMNIA 06/20/2010  . HYPOTHYROIDISM, BORDERLINE 01/17/2010  . OBESITY 01/17/2010  . DEGENERATIVE JOINT DISEASE  01/17/2010  . ANEMIA 10/02/2009  . SINUSITIS, ACUTE 10/02/2009  . ASTHMA 10/02/2009  . Venous (peripheral) insufficiency 03/11/2008  . ALLERGIC RHINITIS 03/11/2008  . BRONCHITIS, RECURRENT 03/11/2008  . MIGRAINES, HX OF 03/11/2008  . ANXIETY 11/24/2007  . Hereditary and idiopathic peripheral neuropathy 11/24/2007  . GERD 11/24/2007  . IRRITABLE BOWEL SYNDROME 11/24/2007  . PSORIATIC ARTHROPATHY 11/24/2007    Cyndy Braver PT,  OCS 10/03/2015, 4:31 PM  [facility] [address]  [place], [place], [address] Phone: [phone]   Fax:  [phone]  Name: Sarah Horton MRN: [id number] Date of Birth: [birth year]

## 2015-10-08 ENCOUNTER — Ambulatory Visit: Payer: Commercial Managed Care - HMO | Attending: Rheumatology

## 2015-10-08 DIAGNOSIS — M545 Low back pain, unspecified: Secondary | ICD-10-CM

## 2015-10-08 NOTE — Therapy (Signed)
[facility] [address]  [place], [place], [address] Phone: [phone]   Fax:  [phone]  Physical Therapy Treatment  Patient Details  Name: Sarah Horton MRN: [id number] Date of Birth: 09-Feb-1951 Referring Provider: Estanislado Pandy  Encounter Date: 10/08/2015      PT End of Session - 10/08/15 1718    Visit Number 11   Number of Visits 12   Date for PT Re-Evaluation 10/09/15   PT Start Time 7681  treatment started late due to pt. arriving late to PT.     PT Stop Time 1745   PT Time Calculation (min) 27 min   Activity Tolerance Patient tolerated treatment well   Behavior During Therapy WFL for tasks assessed/performed      Past Medical History  Diagnosis Date  . Allergic rhinitis, cause unspecified   . Unspecified chronic bronchitis ([place])   . Unspecified venous (peripheral) insufficiency   . Unspecified hypothyroidism   . Obesity, unspecified   . Esophageal reflux   . Irritable bowel syndrome   . Psoriatic arthropathy ([place])   . Unspecified hereditary and idiopathic peripheral neuropathy   . Anxiety state, unspecified   . Anemia, unspecified   . Allergy   . Arthritis   . Cataract     bilaterally removed [id number]    Past Surgical History  Procedure Laterality Date  . Cataract extraction, bilateral      [id number]  . Nasal sinus surgery    . Abdominal hysterectomy    . Shoulder surgery  2005  . Bilat tkrs  04/2010    by [name]  . Joint replacement Bilateral     knees  . Bunionectomy  2009  . Dequervains tensenovitis Left   . Colonoscopy  08-26-2004    with gessner tics and hems   . Ablation on endometriosis      x2    There were no vitals filed for this visit.      Subjective Assessment - 10/08/15 1736    Subjective Pt. states her LBP occasionally bothers her however no pain currently.     Currently in Pain? No/denies   Pain Score 0-No pain   Multiple Pain Sites No     Today  Treatment:  Therex: UBE: level 2.5, 90"/90" Hooklying LE marching x 10 reps  Hooklying bridging x 10 reps  Hooklying B abd/ER x 10 reps B sidelying clam shell with blue TB x 10 reps R shoulder AAROM abduction with cane x 10 reps  R shoulder beach ball roll on wall into abduction x 10 reps  Seated R shoulder AAROM with big red p-ball (75cm) x 10          PT Short Term Goals - 09/10/15 1456    PT SHORT TERM GOAL #2   Title assess R Shoulder by 09/18/15 and update POC and LTG as necessary   Status Achieved           PT Long Term Goals - 10/01/15 1547    PT LONG TERM GOAL #1   Title pt independent with advanced HEP as necessary for LBP and shoulder pain by 10/09/15   Status Achieved  10/01/15: Pt. able to demo/verbalized independence with advanced HEP for LBP and shoulder pain.     PT LONG TERM GOAL #2   Title B LE MMT 4+/5 or better without pain by 10/09/15   Status On-going   PT LONG TERM GOAL #3   Title pt able to  sit as necessary for work and travel without limitation by LBP by 10/09/15   Status Partially Met  10/01/15: Pt. reports she isn't limited by LBP on the average work day however pt. reports some days she is limited by LBP while traveling.     PT LONG TERM GOAL #4   Title pt able to stand as necessary for shopping and ADLs/chores without limitation by LBP by 10/09/15   Status Partially Met  10/01/15: Pt. able to stand as necessary for shopping and ADL/chores without limitation with exception of standing still cooking for over 1 hr.     PT LONG TERM GOAL #5   Title R Shoulder AROM WFL by 10/09/15   Status On-going   PT LONG TERM GOAL #6   Title R Shoulder MMT 4/5 or better all planes by 10/09/15   Status On-going   PT LONG TERM GOAL #7   Title pt able to perform ADLs, chores, and most recreational activities without limitation by R shoulder pain by 10/09/15   Status Achieved  10/01/15: Pt. able to perform ADLs, chores, and most recreational activities without limitation by R  shoulder pain.                 Plan - 10/08/15 1755    Clinical Impression Statement Pt. reports her R shoulder continues to feel good however still with occasional LBP due to standing for longer periods of time now.  Today's treatment focused on lumbopelvic strengthening activity with shoulder abduction stretching per pt. request.     PT Treatment/Interventions Manual techniques;Therapeutic exercise;Therapeutic activities;Traction;Dry needling;Taping;Patient/family education;Balance training;Functional mobility training;Cryotherapy;Electrical Stimulation;Moist Heat   PT Next Visit Plan Pt. anticipates D/c per PT assessment      Patient will benefit from skilled therapeutic intervention in order to improve the following deficits and impairments:  Pain, Postural dysfunction, Improper body mechanics, Decreased strength, Decreased range of motion, Impaired flexibility, Impaired UE functional use  Visit Diagnosis: Bilateral low back pain without sciatica     Problem List Patient Active Problem List   Diagnosis Date Noted  . Varicose veins of right lower extremity with complications 24/46/2863  . Elevated BP 08/09/2015  . Varicose veins of left lower extremity with complications 81/77/1165  . Varicose veins of bilateral lower extremities with other complications 79/08/8331  . Varicose veins of lower extremities with ulcer ([place]) 04/05/2015  . Status post bilateral knee replacements 12/13/2013  . Venous stasis ulcer of ankle ([place]) 06/06/2013  . ADD (attention deficit disorder) 04/04/2012  . INSOMNIA 06/20/2010  . HYPOTHYROIDISM, BORDERLINE 01/17/2010  . OBESITY 01/17/2010  . DEGENERATIVE JOINT DISEASE 01/17/2010  . ANEMIA 10/02/2009  . SINUSITIS, ACUTE 10/02/2009  . ASTHMA 10/02/2009  . Venous (peripheral) insufficiency 03/11/2008  . ALLERGIC RHINITIS 03/11/2008  . BRONCHITIS, RECURRENT 03/11/2008  . MIGRAINES, HX OF 03/11/2008  . ANXIETY 11/24/2007  . Hereditary and  idiopathic peripheral neuropathy 11/24/2007  . GERD 11/24/2007  . IRRITABLE BOWEL SYNDROME 11/24/2007  . PSORIATIC ARTHROPATHY 11/24/2007    Bess Harvest, PTA 10/08/2015, 5:58 PM  [facility] [address]  [place], [place], [address] Phone: [phone]   Fax:  [phone]  Name: MERRYN THAKER MRN: [id number] Date of Birth: 04/13/1951

## 2015-10-10 ENCOUNTER — Ambulatory Visit: Payer: Commercial Managed Care - HMO

## 2015-10-10 DIAGNOSIS — M545 Low back pain, unspecified: Secondary | ICD-10-CM

## 2015-10-10 NOTE — Therapy (Addendum)
[facility] [address]  [place], [place], [address] Phone: [phone]   Fax:  [phone]  Physical Therapy Treatment  Patient Details  Name: Sarah Horton MRN: [id number] Date of Birth: 28-Feb-1951 Referring Provider: Estanislado Pandy  Encounter Date: 10/10/2015      PT End of Session - 10/10/15 0809    Visit Number 12   Number of Visits 12   Date for PT Re-Evaluation 10/09/15   PT Start Time 0804   PT Stop Time 3419   PT Time Calculation (min) 43 min   Activity Tolerance Patient tolerated treatment well   Behavior During Therapy [facility] for tasks assessed/performed      Past Medical History  Diagnosis Date  . Allergic rhinitis, cause unspecified   . Unspecified chronic bronchitis ([place])   . Unspecified venous (peripheral) insufficiency   . Unspecified hypothyroidism   . Obesity, unspecified   . Esophageal reflux   . Irritable bowel syndrome   . Psoriatic arthropathy ([place])   . Unspecified hereditary and idiopathic peripheral neuropathy   . Anxiety state, unspecified   . Anemia, unspecified   . Allergy   . Arthritis   . Cataract     bilaterally removed [id number]    Past Surgical History  Procedure Laterality Date  . Cataract extraction, bilateral      [id number]  . Nasal sinus surgery    . Abdominal hysterectomy    . Shoulder surgery  2005  . Bilat tkrs  04/2010    by [name]  . Joint replacement Bilateral     knees  . Bunionectomy  2009  . Dequervains tensenovitis Left   . Colonoscopy  08-26-2004    with gessner tics and hems   . Ablation on endometriosis      x2    There were no vitals filed for this visit.      Subjective Assessment - 10/10/15 0806    Subjective Pt. states her LBP today is a 4/10 pain initially.  No other pain or complaints reported.  Pt. reports her back pain is worse after driving a lot yesterday for work.     Currently in Pain? Yes   Pain Score 5    Pain  Location Back   Pain Orientation Lower   Pain Descriptors / Indicators Aching;Dull   Pain Type Acute pain   Pain Onset More than a month ago            [facility] PT Assessment - 10/10/15 0824    AROM   Right Shoulder Flexion 176 Degrees   Right Shoulder ABduction 142 Degrees   Right Shoulder Internal Rotation 82 Degrees   Right Shoulder External Rotation 84 Degrees   Strength   Strength Assessment Site Shoulder   Right/Left Shoulder Right   Right Shoulder Flexion 4/5   Right Shoulder ABduction 4/5   Right Shoulder Internal Rotation 4/5   Right Shoulder External Rotation 4/5   Right/Left Hip Right;Left   Right Hip Flexion 4/5   Right Hip Extension 4/5   Right Hip External Rotation  4+/5   Right Hip Internal Rotation 4/5   Right Hip ABduction 4/5   Right Hip ADduction 5/5   Left Hip Flexion 4+/5   Left Hip Extension 4/5   Left Hip External Rotation 4/5   Left Hip Internal Rotation 4+/5   Left Hip ABduction 4+/5   Left Hip ADduction 5/5   Right Knee Flexion 5/5   Right Knee Extension  5/5   Left Knee Flexion 5/5   Left Knee Extension 5/5      Today's Treatment   UBE 1.5 level, 4 min   R shoulder MMT assessment   B LE MMT assessment   R shoulder AROM assessment  Goal assessment       PT Education - 10/10/15 0855    Education provided Yes   Education Details Lumbopelvic strengthening abdominal bracing, abdominal brace with marching, alternating UE, LE in quadruped   Person(s) Educated Patient   Methods Explanation;Handout   Comprehension Verbalized understanding;Returned demonstration;Verbal cues required          PT Short Term Goals - 09/10/15 1456    PT SHORT TERM GOAL #2   Title assess R Shoulder by 09/18/15 and update POC and LTG as necessary   Status Achieved           PT Long Term Goals - 10/10/15 0810    PT LONG TERM GOAL #1   Title pt independent with advanced HEP as necessary for LBP and shoulder pain by 10/09/15   Status Achieved   10/10/15: Pt. able to demo/verbalized independence with advanced HEP for LBP and shoulder pain.     PT LONG TERM GOAL #2   Title B LE MMT 4+/5 or better without pain by 10/09/15   Status Partially Met  10/10/15: Pt. able to get demo 4+/5 or better on most LE MMT without pain.   PT LONG TERM GOAL #3   Title pt able to sit as necessary for work and travel without limitation by LBP by 10/09/15   Status Partially Met  10/10/15: Pt. reports she isn't limited by LBP on the average work day however pt. reports some days she is limited by LBP while traveling.     PT LONG TERM GOAL #4   Title pt able to stand as necessary for shopping and ADLs/chores without limitation by LBP by 10/09/15   Status Partially Met  10/10/15: Pt. able to stand as necessary for shopping and ADL/chores without limitation with exception of standing doing dailly task for prolonged period of time.     PT LONG TERM GOAL #5   Title R Shoulder AROM WFL by 10/09/15   Status Achieved  10/10/15: Pt. R shoulder AROM WFL: R shoulder AROM flexion 176 dg, abduction 142 dg,  IR 82 dg, ER 84 dg   PT LONG TERM GOAL #6   Title R Shoulder MMT 4/5 or better all planes by 10/09/15   Status Achieved  10/10/15: Pt. able to demo R shoulder MMT of 4/5 or better in all planes.     PT LONG TERM GOAL #7   Title pt able to perform ADLs, chores, and most recreational activities without limitation by R shoulder pain by 10/09/15   Status Achieved  10/10/15: Pt. able to perform ADLs, chores, and most recreational activities without limitation by R shoulder pain.                 Plan - 10/10/15 0810    Clinical Impression Statement Pt. able to demo/verbalized independence with advanced HEP for LBP and shoulder pain.  Pt. able to demo 4+/5 or better on most LE MMT without pain.  Pt. reports she isn't limited by LBP on the average work day however reports some days she is limited by LBP while traveling.  Pt. able to stand as necessary for shopping and ADL/chores without  limitation with exception of standing doing daily tasks for prolonged  periods of time.  Pt. R shoulder AROM WFL: R shoulder AROM flexion 176 dg, abduction 142 dg, IR 82 dg, ER 84 dg.  Pt. able to demo R shoulder MMT of 4/5 or better in all planes.  Pt. able to perform ADLs, chores, and most recreational activities without limitation by R shoulder pain.  D/c discussed with pt., pt. agreed to d/c; PT agreed d/c as appropriate.     PT Treatment/Interventions Manual techniques;Therapeutic exercise;Therapeutic activities;Traction;Dry needling;Taping;Patient/family education;Balance training;Functional mobility training;Cryotherapy;Electrical Stimulation;Moist Heat   PT Next Visit Plan Pt. d/c      Patient will benefit from skilled therapeutic intervention in order to improve the following deficits and impairments:  Pain, Postural dysfunction, Improper body mechanics, Decreased strength, Decreased range of motion, Impaired flexibility, Impaired UE functional use  Visit Diagnosis: Bilateral low back pain without sciatica     Problem List Patient Active Problem List   Diagnosis Date Noted  . Varicose veins of right lower extremity with complications 79/90/9400  . Elevated BP 08/09/2015  . Varicose veins of left lower extremity with complications 10/11/6786  . Varicose veins of bilateral lower extremities with other complications 93/38/8266  . Varicose veins of lower extremities with ulcer ([place]) 04/05/2015  . Status post bilateral knee replacements 12/13/2013  . Venous stasis ulcer of ankle ([place]) 06/06/2013  . ADD (attention deficit disorder) 04/04/2012  . INSOMNIA 06/20/2010  . HYPOTHYROIDISM, BORDERLINE 01/17/2010  . OBESITY 01/17/2010  . DEGENERATIVE JOINT DISEASE 01/17/2010  . ANEMIA 10/02/2009  . SINUSITIS, ACUTE 10/02/2009  . ASTHMA 10/02/2009  . Venous (peripheral) insufficiency 03/11/2008  . ALLERGIC RHINITIS 03/11/2008  . BRONCHITIS, RECURRENT 03/11/2008  . MIGRAINES, HX OF  03/11/2008  . ANXIETY 11/24/2007  . Hereditary and idiopathic peripheral neuropathy 11/24/2007  . GERD 11/24/2007  . IRRITABLE BOWEL SYNDROME 11/24/2007  . PSORIATIC ARTHROPATHY 11/24/2007    Bess Harvest, PTA 10/10/2015, 11:04 AM  [facility] [address]  [place], [place], [address] Phone: [phone]   Fax:  [phone]  Name: MADILYNNE MULLAN MRN: [id number] Date of Birth: [birth year]    PHYSICAL THERAPY DISCHARGE SUMMARY  Visits from Start of Care: 12  Current functional level related to goals / functional outcomes: Very good progress overall all goals met or nearly met.   Remaining deficits: Intermittent LBP; mild right shoulder and bilateral hip weakness   Education / Equipment: Lumbar and shoulder HEP Plan: Patient agrees to discharge.  Patient goals were not met. Patient is being discharged due to being pleased with the current functional level.  ?????        Leonette Most PT, OCS 10/10/2015 2:37 PM

## 2015-10-15 ENCOUNTER — Ambulatory Visit: Payer: Commercial Managed Care - HMO

## 2015-10-17 ENCOUNTER — Ambulatory Visit: Payer: Commercial Managed Care - HMO | Admitting: Physical Therapy

## 2016-01-08 ENCOUNTER — Other Ambulatory Visit: Payer: Self-pay | Admitting: Pulmonary Disease

## 2016-01-08 MED ORDER — SUMATRIPTAN 20 MG/ACT NA SOLN: 1.0000 | NASAL | 2 refills | Status: DC | PRN

## 2016-01-08 MED ORDER — PROMETHAZINE HCL 25 MG PO TABS: 25.0000 mg | ORAL_TABLET | Freq: Four times a day (QID) | ORAL | 2 refills | Status: AC | PRN

## 2016-02-11 ENCOUNTER — Ambulatory Visit (INDEPENDENT_AMBULATORY_CARE_PROVIDER_SITE_OTHER): Payer: Commercial Managed Care - HMO | Admitting: Podiatry

## 2016-02-11 ENCOUNTER — Encounter: Payer: Self-pay | Admitting: Podiatry

## 2016-02-11 DIAGNOSIS — L84 Corns and callosities: Secondary | ICD-10-CM | POA: Diagnosis not present

## 2016-02-11 DIAGNOSIS — M216X9 Other acquired deformities of unspecified foot: Secondary | ICD-10-CM | POA: Diagnosis not present

## 2016-02-11 DIAGNOSIS — M79673 Pain in unspecified foot: Secondary | ICD-10-CM | POA: Diagnosis not present

## 2016-02-11 NOTE — Patient Instructions (Signed)
You can try a urea based cream to apply to the calluses.

## 2016-02-11 NOTE — Progress Notes (Signed)
   Subjective:    Patient ID: Sarah Horton, female    DOB: 1951-05-12, 65 y.o.   MRN: [id number]  HPI  65 year old female presents the office today for concerns of calluses versus warts to the balls of her feet on both side which is been ongoing for quite some time. She's tried over-the-counter salicylic acid treatments for any relief. She states that the areas are painful upon pressure. She denies stepping on any foreign objects. Denies any swelling or redness. She has a history of psoriatic arthritis. No other complaints at this time. Review of Systems  All other systems reviewed and are negative.      Objective:   Physical Exam General: AAO x3, NAD  Dermatological: Diffuse hyperkeratotic lesion submetatarsal 1 through 5 bilaterally. These appear to be directly underlying the metatarsal heads 4 and 5 bilaterally. Upon debridement there is no underlying ulceration, drainage or other signs of infection. The right submetatarsal 4 and 5 appear to be worse. No open lesions or other pre-ulcerative lesions.  Vascular: Dorsalis Pedis artery and Posterior Tibial artery pedal pulses are 2/4 bilateral with immedate capillary fill time.There is no pain with calf compression, swelling, warmth, erythema.   Neruologic: Grossly intact via light touch bilateral. Vibratory intact via tuning fork bilateral. Protective threshold with Semmes Wienstein monofilament intact to all pedal sites bilateral.   Musculoskeletal: There is, submetatarsal of plantar aspect of the fat pad. Tenderness along the hyperkeratotic lesions. No other areas of tenderness. Muscular strength 5/5 in all groups tested bilateral.  Gait: Unassisted, Nonantalgic.        Assessment & Plan:  Hyperkeratotic lesions due to pressure, biomechanical changes submetatarsal one to 5 bilaterally. -Treatment options discussed including all alternatives, risks, and complications -Etiology of symptoms were discussed -Hypereratotic  lesions well debrided without complications or bleeding. Offloading pads were dispensed to help offload the metatarsal heads as an added to the over-the-counter insert which she  had purchased already. I also recommended a urea cream to the areas daily. Follow-up in 3 months or sooner if needed. Call any questions concerns meantime.  Ovid CurdMatthew Wagoner, DPM

## 2016-03-17 ENCOUNTER — Encounter: Payer: Self-pay | Admitting: Rheumatology

## 2016-04-02 ENCOUNTER — Other Ambulatory Visit: Payer: Self-pay | Admitting: Rheumatology

## 2016-04-03 LAB — CBC WITH DIFFERENTIAL/PLATELET
BASOS: 0 %
Basophils Absolute: 0 10*3/uL (ref 0.0–0.2)
EOS (ABSOLUTE): 0.1 10*3/uL (ref 0.0–0.4)
Eos: 1 %
HEMATOCRIT: 38.3 % (ref 34.0–46.6)
Hemoglobin: 12.8 g/dL (ref 11.1–15.9)
IMMATURE GRANS (ABS): 0 10*3/uL (ref 0.0–0.1)
Immature Granulocytes: 0 %
LYMPHS: 32 %
Lymphocytes Absolute: 2.9 10*3/uL (ref 0.7–3.1)
MCH: 28.5 pg (ref 26.6–33.0)
MCHC: 33.4 g/dL (ref 31.5–35.7)
MCV: 85 fL (ref 79–97)
MONOS ABS: 0.6 10*3/uL (ref 0.1–0.9)
Monocytes: 7 %
NEUTROS ABS: 5.2 10*3/uL (ref 1.4–7.0)
Neutrophils: 60 %
PLATELETS: 249 10*3/uL (ref 150–379)
RBC: 4.49 x10E6/uL (ref 3.77–5.28)
RDW: 16 % — ABNORMAL HIGH (ref 12.3–15.4)
WBC: 8.8 10*3/uL (ref 3.4–10.8)

## 2016-04-03 LAB — COMPREHENSIVE METABOLIC PANEL
A/G RATIO: 1.7 (ref 1.2–2.2)
ALT: 30 IU/L (ref 0–32)
AST: 26 IU/L (ref 0–40)
Albumin: 4.5 g/dL (ref 3.6–4.8)
Alkaline Phosphatase: 85 IU/L (ref 39–117)
BILIRUBIN TOTAL: 0.4 mg/dL (ref 0.0–1.2)
BUN / CREAT RATIO: 16 (ref 12–28)
BUN: 17 mg/dL (ref 8–27)
CHLORIDE: 104 mmol/L (ref 96–106)
CO2: 21 mmol/L (ref 18–29)
Calcium: 9.2 mg/dL (ref 8.7–10.3)
Creatinine, Ser: 1.04 mg/dL — ABNORMAL HIGH (ref 0.57–1.00)
GFR calc non Af Amer: 58 mL/min/{1.73_m2} — ABNORMAL LOW (ref >59)
GFR, EST AFRICAN AMERICAN: 67 mL/min/{1.73_m2} (ref >59)
GLOBULIN, TOTAL: 2.6 g/dL (ref 1.5–4.5)
Glucose: 91 mg/dL (ref 65–99)
POTASSIUM: 4.7 mmol/L (ref 3.5–5.2)
SODIUM: 142 mmol/L (ref 134–144)
TOTAL PROTEIN: 7.1 g/dL (ref 6.0–8.5)

## 2016-04-05 ENCOUNTER — Telehealth: Payer: Self-pay | Admitting: Radiology

## 2016-04-05 NOTE — Telephone Encounter (Signed)
Lab results from [facility] CBC CMP c/w previous  Will call pt / sent for scanning, since I can not pull up [facility]

## 2016-04-06 NOTE — Telephone Encounter (Signed)
I have called patient to advise her of lab results left message

## 2016-04-08 NOTE — Progress Notes (Signed)
*IMAGE* Office Visit Note  Patient: Sarah Horton             Date of Birth: 10-Dec-1950           MRN: [id number]             PCP: Michele Mcalpine, MD Referring: Michele Mcalpine, MD Visit Date: 04/10/2016 Occupation:@GUAROCC @    Subjective:  Follow-up Follow-up on psoriatic arthritis psoriasis and high risk prescription  History of Present Illness: Sarah Horton is a 65 y.o. female last seen in our office by Mr. Leane Call on 11/11/2015. At that time patient Psoriatic arthritis was doing well. No flares. No joint pain stiffness and swelling. The psoriasis is also doing well and she was not having any flare. She was off of Enbrel since about August 2017. She is doing really well with OTEZLA , methotrexate 6 per week, folic acid 2 per day. She's having some low back pain at that visit. She was sent to physical therapy for evaluation and treatment of back pain.   Today, the patient states she is doing well w/ PsA. Has stiffness to back w/ extended sitting, standing, etc. Has proper shoes w/ inserts. Sees podiatris and he worked on calluses .  Walked a lot on a recent trip to [place] and did well.    of Daily Living:  Patient reports morning stiffness for 15 minutes.   Patient Denies nocturnal pain.  Difficulty dressing/grooming: Denies Difficulty climbing stairs: Denies Difficulty getting out of chair: Denies Difficulty using hands for taps, buttons, cutlery, and/or writing: Denies   Review of Systems  Constitutional: Negative for fatigue.  HENT: Negative for mouth sores and mouth dryness.   Eyes: Negative for dryness.  Respiratory: Negative for shortness of breath.   Gastrointestinal: Negative for constipation and diarrhea.  Musculoskeletal: Negative for myalgias and myalgias.  Skin: Negative for sensitivity to sunlight.  Psychiatric/Behavioral: Negative for decreased concentration and sleep disturbance.    PMFS History:  Patient Active Problem List   Diagnosis Date Noted  . Subacromial bursitis of right shoulder joint 04/10/2016  . Low back pain 04/09/2016  . High risk medications (not anticoagulants) long-term use 04/09/2016  . Pre-ulcerative calluses 02/11/2016  . Varicose veins of right lower extremity with complications 08/12/2015  . Elevated BP 08/09/2015  . Varicose veins of left lower extremity with complications 07/29/2015  . Varicose veins of bilateral lower extremities with other complications 07/09/2015  . Varicose veins of lower extremities with ulcer ([facility]) 04/05/2015  . Status post bilateral knee replacements 12/13/2013  . Venous stasis ulcer of ankle ([facility]) 06/06/2013  . ADD (attention deficit disorder) 04/04/2012  . INSOMNIA 06/20/2010  . HYPOTHYROIDISM, BORDERLINE 01/17/2010  . OBESITY 01/17/2010  . DEGENERATIVE JOINT DISEASE 01/17/2010  . ANEMIA 10/02/2009  . SINUSITIS, ACUTE 10/02/2009  . ASTHMA 10/02/2009  . Venous (peripheral) insufficiency 03/11/2008  . ALLERGIC RHINITIS 03/11/2008  . BRONCHITIS, RECURRENT 03/11/2008  . MIGRAINES, HX OF 03/11/2008  . ANXIETY 11/24/2007  . Hereditary and idiopathic peripheral neuropathy 11/24/2007  . GERD 11/24/2007  . IRRITABLE BOWEL SYNDROME 11/24/2007  . PSORIATIC ARTHROPATHY 11/24/2007    Past Medical History:  Diagnosis Date  . Allergic rhinitis, cause unspecified   . Allergy   . Anemia, unspecified   . Anxiety state, unspecified   . Arthritis   . Cataract    bilaterally removed 2003,2007  . Esophageal reflux   . Irritable bowel syndrome   . Migraine   . Obesity, unspecified   . Psoriatic  arthropathy ([facility])   . Unspecified chronic bronchitis   . Unspecified hereditary and idiopathic peripheral neuropathy   . Unspecified hypothyroidism   . Unspecified venous (peripheral) insufficiency     Family History  Problem Relation Age of Onset  . Cirrhosis Father   . COPD Mother   . Heart disease Mother   . Hypertension Mother   . Varicose Veins Mother   .  Breast cancer Maternal Grandmother   . Breast cancer Other     1st cousin   . Pancreatic cancer Other     1st cousin   . Colon cancer Neg Hx   . Colon polyps Neg Hx   . Esophageal cancer Neg Hx   . Rectal cancer Neg Hx   . Stomach cancer Neg Hx    Past Surgical History:  Procedure Laterality Date  . ABDOMINAL HYSTERECTOMY    . ABDOMINAL HYSTERECTOMY    . ABLATION ON ENDOMETRIOSIS     x2  . Bilat TKRs  04/2010   by [name]  . BUNIONECTOMY  2009  . CATARACT EXTRACTION, BILATERAL     [id number],2007  . COLONOSCOPY  08-26-2004   with gessner tics and hems   . dequervains tensenovitis Left   . JOINT REPLACEMENT Bilateral    knees  . NASAL SINUS SURGERY    . SHOULDER SURGERY  2005   Social History   Social History Narrative  . No narrative on file     Objective: Vital Signs: BP 119/79 (BP Location: Left Arm, Patient Position: Sitting, Cuff Size: Large)   Pulse (!) 101   Resp 14   Ht 5\' 9"  (1.753 m)   Wt 229 lb (103.9 kg)   BMI 33.82 kg/m    Physical Exam  Constitutional: She is oriented to person, place, and time. She appears well-developed and well-nourished.  HENT:  Head: Normocephalic and atraumatic.  Eyes: EOM are normal. Pupils are equal, round, and reactive to light.  Cardiovascular: Normal rate, regular rhythm and normal heart sounds.  Exam reveals no gallop and no friction rub.   No murmur heard. Pulmonary/Chest: Effort normal and breath sounds normal. She has no wheezes. She has no rales.  Abdominal: Soft. Bowel sounds are normal. She exhibits no distension. There is no tenderness. There is no guarding. No hernia.  Musculoskeletal: Normal range of motion. She exhibits no edema, tenderness or deformity.  Lymphadenopathy:    She has no cervical adenopathy.  Neurological: She is alert and oriented to person, place, and time. Coordination normal.  Skin: Skin is warm and dry. Capillary refill takes less than 2 seconds. No rash noted.  Psychiatric: She has a normal  mood and affect. Her behavior is normal.  Nursing note and vitals reviewed.    Musculoskeletal Exam:  Full range of motion of all joints Grip strength is equal and strong bilaterally Fibromyalgia tender points are all absent  CDAI Exam: No CDAI exam completed.   No synovitis.  Investigation: No additional findings.  Labs from 01/24/2016 shows CBC with differential is normal CMP with GFR shows elevated nonfasting glucose at 103 creatinine mildly elevated at 1.10 with a GFR slightly low at 54  Imaging: No results found.  Speciality Comments: No specialty comments available.    Procedures:  No procedures performed Allergies: Nucynta [tapentadol]; Codeine; and Penicillins   Assessment / Plan: Visit Diagnoses: PSORIATIC ARTHROPATHY  High risk medications (not anticoagulants) long-term use - OTEZLA & MTX 6/WEEK --ADEQUATE RESPONSE--------->>FAILED ENBREL AUG 2017  Plaque psoriasis  Low back  pain, unspecified back pain laterality, unspecified chronicity, with sciatica presence unspecified  Subacromial bursitis of right shoulder joint   Labs are not needed for Otezla but we do need labs for methotrexate  CBC with differential CMP with GFR today and then every 3 months           Recent labs from 04/02/2016 are in the computer. I'm able to see that her CMP with GFR are normal except for creatinine is slightly elevated at 1.04 and GFR is slightly low at 58 but acceptable. Patient's CBC is normal except her RDW is 16.0 which is close to normal limits.  Continue efforts at weight loss We discussed pt may want to look into next 56 day diet.  patient had bone density done via PCP, Dr. Lynden AngElizabeth Lane. It was done in January 2017. It showed osteopenia. She had a T score of -2.0. It is being addressed by the PCP. Patient will do proper diet exercise to keep the bone health.  I gave the patient a prescription for Voltaren gel and dispensed 3 tubes with 3 refills. She can give this a  try and see if it helps her. She is currently having some right anterior shoulder joint pain consistent with bursitis. Most days she does not have any trouble with range of motion but on occasion she does. She does not want any injections at this time which is understandable and we deferred injections until future visit. For now Voltaren gel may be useful and she'll give it a try. We discussed on how to use it how to apply it and to use good Rx to get 1 tube for $27  Follow-up in 5 months  Adnexa visit patient is doing well without has led methotrexate. See if she needs methotrexate still. It may be wise to consider decreasing her methotrexate and stopping it altogether.  Orders: No orders of the defined types were placed in this encounter.  Meds ordered this encounter  Medications  . MAGNESIUM MALATE PO    Sig: Take by mouth.  . diclofenac sodium (VOLTAREN) 1 % GEL    Sig: Voltaren Gel 3 grams to 3 large joints upto TID 3 TUBES with 3 refills    Dispense:  3 Tube    Refill:  3    Voltaren Gel 3 grams to 3 large joints upto TID 3 TUBES with 3 refills    Face-to-face time spent with patient was 30 minutes. 50% of time was spent in counseling and coordination of care.  Follow-Up Instructions: Return in about 5 months (around 09/08/2016) for PsA, Ps, Otezla and MTX working well, ?consider stopping MTX .

## 2016-04-09 DIAGNOSIS — M545 Low back pain, unspecified: Secondary | ICD-10-CM | POA: Insufficient documentation

## 2016-04-10 ENCOUNTER — Ambulatory Visit (INDEPENDENT_AMBULATORY_CARE_PROVIDER_SITE_OTHER): Payer: Commercial Managed Care - HMO | Admitting: Rheumatology

## 2016-04-10 ENCOUNTER — Encounter: Payer: Self-pay | Admitting: Rheumatology

## 2016-04-10 VITALS — BP 119/79 | HR 101 | Resp 14 | Ht 69.0 in | Wt 229.0 lb

## 2016-04-10 DIAGNOSIS — Z79899 Other long term (current) drug therapy: Secondary | ICD-10-CM

## 2016-04-10 DIAGNOSIS — M545 Low back pain: Secondary | ICD-10-CM | POA: Diagnosis not present

## 2016-04-10 DIAGNOSIS — L4050 Arthropathic psoriasis, unspecified: Secondary | ICD-10-CM

## 2016-04-10 DIAGNOSIS — M7551 Bursitis of right shoulder: Secondary | ICD-10-CM | POA: Diagnosis not present

## 2016-04-10 DIAGNOSIS — L400 Psoriasis vulgaris: Secondary | ICD-10-CM | POA: Diagnosis not present

## 2016-04-10 MED ORDER — DICLOFENAC SODIUM 1 % TD GEL: TRANSDERMAL | 3 refills | Status: DC

## 2016-04-13 ENCOUNTER — Other Ambulatory Visit: Payer: Self-pay | Admitting: Rheumatology

## 2016-04-13 NOTE — Telephone Encounter (Signed)
Last visit 11/11/15 Next visit 09/11/16 Ok to refill per Dr Corliss Skainseveshwar

## 2016-04-25 ENCOUNTER — Other Ambulatory Visit: Payer: Self-pay | Admitting: Rheumatology

## 2016-04-27 NOTE — Telephone Encounter (Signed)
Okay to refill methotrexate 6 per week.According to the last visit, at the next visit 09/11/2016, if patient is still doing well, we may consider decreasing her methotrexate 4 per week.

## 2016-04-27 NOTE — Telephone Encounter (Signed)
Labs 04/02/16 show very slight increase Creat ,decrease of GFR Ok to refill MTX 6/week ?  Your note states consider decreasing or stopping MTX, please review and advise if ok to refill   Last visit 04/10/16 Next visit 09/11/16

## 2016-04-28 MED ORDER — METHOTREXATE 2.5 MG PO TABS: ORAL_TABLET | ORAL | 0 refills | Status: DC

## 2016-05-02 ENCOUNTER — Other Ambulatory Visit: Payer: Self-pay | Admitting: Rheumatology

## 2016-05-04 NOTE — Telephone Encounter (Signed)
04/10/16 last visit  09/11/16 next visit  Ok to refill per Dr Corliss Skainseveshwar

## 2016-05-15 ENCOUNTER — Other Ambulatory Visit: Payer: Commercial Managed Care - HMO

## 2016-05-15 ENCOUNTER — Ambulatory Visit: Payer: Commercial Managed Care - HMO | Admitting: Acute Care

## 2016-05-15 ENCOUNTER — Encounter: Payer: Self-pay | Admitting: Acute Care

## 2016-05-15 ENCOUNTER — Ambulatory Visit (INDEPENDENT_AMBULATORY_CARE_PROVIDER_SITE_OTHER): Payer: Commercial Managed Care - HMO | Admitting: Acute Care

## 2016-05-15 VITALS — BP 126/80 | HR 90 | Ht 69.0 in | Wt 224.2 lb

## 2016-05-15 DIAGNOSIS — R109 Unspecified abdominal pain: Secondary | ICD-10-CM

## 2016-05-15 MED ORDER — CIPROFLOXACIN HCL 500 MG PO TABS: 500.0000 mg | ORAL_TABLET | Freq: Two times a day (BID) | ORAL | 0 refills | Status: DC

## 2016-05-15 NOTE — Patient Instructions (Addendum)
I am sorry you are not feeling well today We will do a urine culture  And UA today.. We will prescribe Cipro 500 mg twice daily x 7 days. If not improving by Monday call us back. If worsens over the weekend seek care at [facility]. Follow up appointment with Dr. Kriste BasqueNadel in 2 weeks. Please contact office for sooner follow up if symptoms do not improve or worsen or seek emergency care

## 2016-05-15 NOTE — Assessment & Plan Note (Signed)
Right Flank pain in a patient who is chronically immunocompromised on methotrexate and Otezla Plan: We will do a urine culture  And UA today.. We will prescribe Cipro 500 mg twice daily x 7 days. If not improving by Monday call us back. If worsens over the weekend seek care at [facility]. Follow up appointment with Dr. Kriste BasqueNadel in 2 weeks. Please contact office for sooner follow up if symptoms do not improve or worsen or seek emergency care

## 2016-05-15 NOTE — Progress Notes (Signed)
History of Present Illness Sarah BachCatherine M Moltz is a 65 y.o. female former smoker with recurrent bronchitis, psoriatic arthritis followed by Dr. Kriste BasqueNadel.   05/15/2016 Acute OV: Pt. Presents today for a suspected kidney infection. She states she has not had one for 40 years, but used to have them frequently. She has flank area back pain in the right.She has tried heat ,Voltaren., and Advil without relief.. She is  on [place]iatezla and Methotrexate and Robaxin for psoriatic arthritis.She suspects she has had a fever, but has not taken temperature. No urine cloudiness or odor noted. She denies chest pain, orthopnea or hemoptysis. I consulted with Dr. Kriste BasqueNadel on this case. We will send urine for culture and UA, treat with Cipro, and if no improvement by Monday, we will consider CT to assess kidneys.   Tests 05/15/2016: Urine Culture>> Pending  Past medical hx Past Medical History:  Diagnosis Date  . Allergic rhinitis, cause unspecified   . Allergy   . Anemia, unspecified   . Anxiety state, unspecified   . Arthritis   . Cataract    bilaterally removed 2003,2007  . Esophageal reflux   . Irritable bowel syndrome   . Migraine   . Obesity, unspecified   . Psoriatic arthropathy (HCC)   . Unspecified chronic bronchitis   . Unspecified hereditary and idiopathic peripheral neuropathy   . Unspecified hypothyroidism   . Unspecified venous (peripheral) insufficiency      Past surgical hx, Family hx, Social hx all reviewed.  Current Outpatient Prescriptions on File Prior to Visit  Medication Sig  . amphetamine-dextroamphetamine (ADDERALL) 30 MG tablet Take 30 mg by mouth daily.  . cetirizine (ZYRTEC) 10 MG tablet Take 10 mg by mouth daily.  . Cholecalciferol (VITAMIN D3) 1000 units CAPS Take by mouth.  . diclofenac sodium (VOLTAREN) 1 % GEL Voltaren Gel 3 grams to 3 large joints upto TID 3 TUBES with 3 refills  . famotidine (PEPCID) 10 MG tablet Take 20 mg by mouth daily.   Marland Kitchen. lactobacillus  acidophilus (BACID) TABS tablet Take 1 tablet by mouth daily.  Marland Kitchen. levothyroxine (SYNTHROID, LEVOTHROID) 50 MCG tablet Take 75 mcg by mouth daily.   Marland Kitchen. MAGNESIUM MALATE PO Take by mouth.  Marland Kitchen. MAGNESIUM PO Take 750 mg by mouth daily.  . methocarbamol (ROBAXIN) 500 MG tablet TAKE 1 TABLET BY MOUTH 3 TIMES A DAY AS NEEDED  . methotrexate (RHEUMATREX) 2.5 MG tablet TAKE 6 TABLETS ONCE A WEEK  . metroNIDAZOLE (METROGEL) 0.75 % gel Apply 1 application topically 2 (two) times daily.  . montelukast (SINGULAIR) 10 MG tablet Take 10 mg by mouth at bedtime.   . Multiple Vitamin (MULTIVITAMIN) tablet Take 1 tablet by mouth daily.    . mupirocin ointment (BACTROBAN) 2 % Place 1 application into the nose 2 (two) times daily.  . Naproxen Sodium (ALEVE PO) Take 2 tablets by mouth 2 (two) times daily before a meal.  . OTEZLA 30 MG TABS TAKE ONE TABLET (30 MG) BY MOUTH TWICE DAILY. TAKE WITH OR WITHOUT FOOD. DO NOT CRUSH, CHEW OR SPLIT TABLET. STORE AT ROOM TEMPERATURE.  . promethazine (PHENERGAN) 25 MG tablet Take 1 tablet (25 mg total) by mouth every 6 (six) hours as needed for nausea or vomiting.  Marland Kitchen. SALINE MIST SPRAY NA Place into the nose.  . SUMAtriptan (IMITREX) 20 MG/ACT nasal spray Place 1 spray (20 mg total) into the nose every 2 (two) hours as needed.   No current facility-administered medications on file prior to visit.  Allergies  Allergen Reactions  . Nucynta [Tapentadol]     Hives  . Codeine     REACTION: severe nausea by itself  . Penicillins     REACTION: hives all over    Review Of Systems:  Constitutional:   No  weight loss, night sweats,  ?Fevers, +chills,+ fatigue, or  lassitude.  HEENT:   No headaches,  Difficulty swallowing,  Tooth/dental problems, or  Sore throat,                No sneezing, itching, ear ache, nasal congestion, post nasal drip,   CV:  No chest pain,  Orthopnea, PND, swelling in lower extremities, anasarca, dizziness, palpitations, syncope.   GI  No  heartburn, indigestion, abdominal pain, nausea, vomiting, diarrhea, change in bowel habits, loss of appetite, bloody stools.   Resp: No shortness of breath with exertion or at rest.  No excess mucus, no productive cough,  No non-productive cough,  No coughing up of blood.  No change in color of mucus.  No wheezing.  No chest wall deformity  Skin: no rash or lesions.  GU: no dysuria, change in color of urine, no urgency or frequency.  + R flank pain, no hematuria   MS:  No joint pain or swelling.  No decreased range of motion.  No back pain.  Psych:  No change in mood or affect. No depression or anxiety.  No memory loss.   Vital Signs BP 126/80 (BP Location: Left Arm, Patient Position: Sitting, Cuff Size: Normal)   Pulse 90   Ht 5\' 9"  (1.753 m)   Wt 224 lb 3.2 oz (101.7 kg)   SpO2 98%   BMI 33.11 kg/m    Physical Exam:  General- No distress,  A&Ox3, pleasant ENT: No sinus tenderness, TM clear, pale nasal mucosa, no oral exudate,no post nasal drip, no LAN Cardiac: S1, S2, regular rate and rhythm, no murmur Chest: No wheeze/ rales/ dullness; no accessory muscle use, no nasal flaring, no sternal retractions Abd.: Soft Non-tender Ext: No clubbing cyanosis, edema Neuro:  normal strength Skin: No rashes, warm and dry Psych: normal mood and behavior + Right Flank pain.   Assessment/Plan  Flank pain, acute Right Flank pain in a patient who is chronically immunocompromised on methotrexate and Otezla Plan: We will do a urine culture  And UA today.. We will prescribe Cipro 500 mg twice daily x 7 days. If not improving by Monday call us back. If worsens over the weekend seek care at [facility]. Follow up appointment with Dr. Kriste BasqueNadel in 2 weeks. Please contact office for sooner follow up if symptoms do not improve or worsen or seek emergency care      Bevelyn NgoSarah F Groce, NP 05/15/2016  4:01 PM

## 2016-05-17 LAB — URINE CULTURE: Organism ID, Bacteria: NO GROWTH

## 2016-05-21 ENCOUNTER — Other Ambulatory Visit: Payer: Self-pay | Admitting: *Deleted

## 2016-05-29 ENCOUNTER — Other Ambulatory Visit: Payer: Self-pay | Admitting: *Deleted

## 2016-05-29 MED ORDER — APREMILAST 30 MG PO TABS: ORAL_TABLET | ORAL | 0 refills | Status: DC

## 2016-06-18 ENCOUNTER — Ambulatory Visit (INDEPENDENT_AMBULATORY_CARE_PROVIDER_SITE_OTHER)
Admission: RE | Admit: 2016-06-18 | Discharge: 2016-06-18 | Disposition: A | Discharge status: Home / Self Care | Payer: Commercial Managed Care - HMO | Source: Ambulatory Visit | Attending: Pulmonary Disease | Admitting: Pulmonary Disease

## 2016-06-18 ENCOUNTER — Encounter: Payer: Self-pay | Admitting: Pulmonary Disease

## 2016-06-18 ENCOUNTER — Ambulatory Visit (INDEPENDENT_AMBULATORY_CARE_PROVIDER_SITE_OTHER): Payer: Commercial Managed Care - HMO | Admitting: Pulmonary Disease

## 2016-06-18 VITALS — BP 132/84 | HR 96 | Temp 98.7°F | Ht 68.5 in | Wt 225.4 lb

## 2016-06-18 DIAGNOSIS — M545 Low back pain: Secondary | ICD-10-CM

## 2016-06-18 DIAGNOSIS — G609 Hereditary and idiopathic neuropathy, unspecified: Secondary | ICD-10-CM

## 2016-06-18 DIAGNOSIS — J301 Allergic rhinitis due to pollen: Secondary | ICD-10-CM

## 2016-06-18 DIAGNOSIS — I83893 Varicose veins of bilateral lower extremities with other complications: Secondary | ICD-10-CM

## 2016-06-18 DIAGNOSIS — R0789 Other chest pain: Secondary | ICD-10-CM | POA: Diagnosis not present

## 2016-06-18 DIAGNOSIS — Z6834 Body mass index (BMI) 34.0-34.9, adult: Secondary | ICD-10-CM

## 2016-06-18 DIAGNOSIS — I872 Venous insufficiency (chronic) (peripheral): Secondary | ICD-10-CM | POA: Diagnosis not present

## 2016-06-18 DIAGNOSIS — M150 Primary generalized (osteo)arthritis: Secondary | ICD-10-CM

## 2016-06-18 DIAGNOSIS — M159 Polyosteoarthritis, unspecified: Secondary | ICD-10-CM

## 2016-06-18 DIAGNOSIS — L4050 Arthropathic psoriasis, unspecified: Secondary | ICD-10-CM

## 2016-06-18 DIAGNOSIS — G8929 Other chronic pain: Secondary | ICD-10-CM

## 2016-06-18 DIAGNOSIS — E6609 Other obesity due to excess calories: Secondary | ICD-10-CM

## 2016-06-18 DIAGNOSIS — K219 Gastro-esophageal reflux disease without esophagitis: Secondary | ICD-10-CM

## 2016-06-18 DIAGNOSIS — E039 Hypothyroidism, unspecified: Secondary | ICD-10-CM

## 2016-06-18 DIAGNOSIS — K589 Irritable bowel syndrome without diarrhea: Secondary | ICD-10-CM

## 2016-06-18 MED ORDER — TRAMADOL HCL 50 MG PO TABS: 50.0000 mg | ORAL_TABLET | Freq: Two times a day (BID) | ORAL | 1 refills | Status: DC | PRN

## 2016-06-18 NOTE — Patient Instructions (Signed)
Today we updated your med list in our EPIC system...    Continue your current medications the same...  We decided to check a follow up CXR today...    We will contact you w/ the results when available...   Continue w/ your topical management using rest, heat, etc...    Add-in the topical LIDOCAINE patches to see if this helps...  We wrote for TRAMADOL 50mg  to take up to twice daily with an extra-strength TYLENOL to boost it's effect...  At your convenience- let's reschedule your annual exam w/ EKG & FASTING blood work at that time.Marland Kitchen..Marland Kitchen

## 2016-06-19 ENCOUNTER — Encounter: Payer: Self-pay | Admitting: Pulmonary Disease

## 2016-06-19 NOTE — Progress Notes (Signed)
Subjective:    Patient ID: Sarah Horton, female    DOB: 07/07/1950, 66 y.o.   MRN: [id number]  HPI 66 y/o WF here for a follow up visit... she has multiple medical problems as noted below...   SEE PREV EPIC NOTES FOR OLDER DATA >>    LABS 4/13 from Sarah Horton> FLP- at goals on diet alone;  BMet- wnl w/ BS=84 A1c=5.4 & Creat=1.1;  TSH=1.98   ~  December 14, 2012:  Yearly [place] describes a busy yr, lots going on, just can't seem to lose weight;  We reviewed the following medical problems during today's office visit >>     AR, AB> on Nasonex, Astelin, Xyzal5 as needed; followed by Sarah Horton for allergy- prev on shots but she stopped on her own, may need retesting...    Ven Insuffic> on low sodium, elevation, support hose; not requiring diuretics & no signif edema...    Hypothy> followed by Sarah Horton on Synthroid50; we do not have notes or labs but pt assures me they are ok...    Obesity> wt= 238# & unable to lose; we reviewed diet + exercise; asked to discuss this w/ her Endocrinologist...    GI- GERD, Divertics, IBS> on Prilosec20 & Phenergan 41m for nausea w/ migraines; last colon 2006 by Sarah Horton w/o polyps found...    DJD & Psoriatic arthritis> followed by Sarah Horton on Enbrel, & Robaxin500Bid prn; last seen 2/14 & note reviewed- hx psoriatic arthritis c/o pain in feet, LBP, tol Enbrel well- labs (done every 343moreported all nromal; they rec [facility]...    Hx migraines> on Imitrex nasal spray per Sarah Horton (likes the spray & wants tabs too); when last seen 2013 they tried shots in scalp for migraines- no benefit; states HAs diminished off Premarin...    Hx periph neuropathy> she [place]iat[place] "it's like walking on rocks" & she is rec to try non-weight-bearing exercises...    Anxiety, ?ADD> she [facility]XR3059[facility]; they requested EKG here prior to Rx- this was done 10/13 w/ NSR, rate66, poor R prog V1-3, otherw wnl... We  reviewed prob list, meds, xrays and labs> see below for updates >> she requests handicap sticker...  EKG 10/13 by Sarah Horton showed NSR, rate66, poor R progression  LABS:  Last labs in Epic 1/12; she tells me that all labs done now by Sarah Horton at [facility] & Sarah Horton (asked to get copies to us [place] scan into EPIC)...   ~  June 06, 2013:  49mo2mo & add-on appt requested for sore over left medial malleolus; some discomfort, some min drainage, not red/ inflammed, no f/c/s, notes it's slow to heal; she [facility] & they suggested f/u here... She has VV/ VI stasis changes, notes a scratch on left ankle area above medial malleolus w/ broken skin that started this whole thing, she wears TED hose daily;  Exam shows VV/VI, dependent rubor, onychomycosis;  She has seen Ortho- Sarah Horton in the past regarding her right foot & fx toe w/ 2nd toe overriding the 3rd... We decided to treat w/ gentle cleansing 1-2x per day, cover w/ silvadene cream & no-stick pad, plus her support hose... She is to call if not improving for wound care clinic referral...    She sees Sarah Horton for her allergies> cough, reflux related and Rx helps; Qnasl is helping her sinuses she says...     She has DJD & psoriatic arthritis followed regularly by Sarah Horton  on Enbrel w/ labs done via her office every 73mo also sees practitioners at [facility]..Marland KitchenMarland Kitchen   ?peripheral neuropathy prev [place] and we don't have records We reviewed prob list, meds, xrays and labs> see below for updates >> she had the 2014 Flu vaccine 10/14...   LABS 12/14:  Chems- wnl;  CBC- wnl w/ WBC=8.2;  TSH=4.17  ~  December 13, 2013:  669moOV & Sarah Horton indicating that she is under considerable stress w/ husb illness (DM, ITP, FLD=>cirrhosis, DDD in neck);  She continues to get her regular medical care from her specialists that she sees w/ some regularity>>    Sarah Horton (ENT- seen 4/4/85note reviewed) & VanWinkle (Allergy) for AR,  sinusitis; she had ENT eval & CT Sinus 4/15 showing post surg changes, no active sinus dis/ mucosal thickening/ or obstruction; he suspected noct reflux- Rx Prilosec, antireflux regimen, Dymista;  She notes that she can feel her sinuses draining & doubts this is reflux since she often hold her head down over the bed which stops the trickle & helps her cough;  Exam is neg, lungs are clear;  CXR today shows norm heart size, clear lungs, NAD (remote healed left 6th rib fx)... We reviewed effective antireflux measures...     Sarah Horton (Endocrine- we do not have recent notes from her) for Hypothyroid, Obesity (wt up 4# to 251#, BMI=37-8), Lipid checks; pt indicates that Labs 4/15 showed TSH= 2.38 and A1c= 5.1; she will get FLP done, we reviewed diet recs...    DrDeveschwar (Rheum- we do not have recent notes from her) follows her Psoriatic arthritis & DJD on Enbrel & Robaxin; she's had bilat TKRs from Sarah Horton; pt brought labs from 5/15> Chems- wnl;  CBC- ok w/ Hg=12.3 (MCV=81);  Quantiferon Gold= NEG...     She also has VenInsuffic & hx stasis ulcer on left medial malleolus area treated w/ cleansing, Silvadene cream, dressings, and eventually resolved; more recent right foot big toe fx, hx hammer toes w/ prev surg & followed by Podiatry...  We reviewed prob list, meds, xrays and labs> see below for updates >>   CT Sinuses 09/2013 by ENT showed no active sinus dis...   Labs from Sarah Horton/15 reviewed...  CXR 7/15 showed norm heart size, clear lungs, NAD (remote healed left 6th rib fx)..  ~  February 07, 2015:  1379moV & add-on appt requested by pt after a fall at home on sidewalk ~3wks ago; she bruised her right side but didn't break the skin; several days later she noted an ulcer at her right medial malleolus assoc w/ some redness/ drainage of clear fluid/ etc;  She has been treating herself w/ Duoderm, then recently switched to Silvadene+dressing but noted some pain in the area;  She called her Ortho-  Sarah Horton who did her bilat TKRs but he couldn't see her til Oct, and similar after checking w/ her rheum- Sarah Horton (on Enbrel for psoriatic arthritis);  She then called here for add-on appt to be checked...    As noted above she has a long list of medical specialists tending to her medical needs>  IMP/PLAN>>  We decided to treat w/ Keflex50Tis x7d and Vicodin prn pain;  She will clean once or twice daily, cover w/ Silvadene cream/ non-stick dressing/ paper tape;  She will f/u w/ her Orthopedist & rheumatologist;  Offered referral to wound clinic if necessary...  ADDENDUM>>  Venous Dopplers 03/08/15 by [facility] showed no evid of DVT or superficial vein thrombosis,  vein incompetence in bilat GSV & common femoral veins....  ~  June 28, 2015:  54moROV & Sarah is here to have FLMA forms completed for time off work;  She needs a LOA from work for several months due to on-going difficulty w/ recurrent leg wound care, Rheumatology management of her psoriatic arthritis & DJD, [facility]...  We reviewed the following medical problems during today's office visit >>     AR, AB> on Nasalide, Zyrtek10, Singulair10- followed by Sarah Horton etal for allergy- prev on shots but she stopped on her own, may need retesting; lastseen 12/16- note reviewed...    Ven Insuffic, LEG WOUNDS- stasis ulcers on legs> on low sodium, elevation, support hose; not requiring diuretics & no signif edema; she has chr ven insuffic w/ eval by [facility]- DrChen/ DrLawson w/ GSV surgery planned; she is followed in the wound care clinic (seen ER 05/25/15) w/ on-going treatment w/ Silvadene & Sorbigel...    Hypothy> followed by Sarah Horton on Synthroid50- taking 1.5 tabs/d; we do not have notes or labs but pt assures me they are ok!    Obesity> wt= 235# & unable to lose; we reviewed diet + exercise; asked to discuss this w/ her Endocrinologist, Sarah Horton...    GI- GERD, Divertics, IBS> on Pepcid20,  Miralax, & Phenergan 245mfor nausea w/ migraines; last colon 2006 by Sarah Horton w/o polyps found & she is due for f/u screening procedure...    DJD & Psoriatic arthritis> followed by Sarah Horton- prev on Enbrel (off x months due to legulcers), on Robaxin500Bid prn; hx psoriatic arthritis c/o pain in feet, LBP, etc; she has seen [facility]; pt tells me they plan to restart MTX before Enbrel again after her vein surg (we do not have recent notes from Rheum)...    Hx migraines> on Imitrex nasal spray per Sarah Horton (likes the spray & wants tabs too); when last seen 2013 they tried shots in scalp for migraines- no benefit; states HAs diminished off Premarin...    Hx periph neuropathy> she [place]iat[place] "it's like walking on rocks" & she is rec to try non-weight-bearing exercises...    Anxiety, ?ADD> she is on Adderall-XR3062m [facility]; they requested EKG here prior to Rx- this was done 10/13 w/ NSR, rate66, poor R prog V1-3, otherw wnl... EXAM shows Afeb, VSS,O2sat=96% on RA;  HEENT- neg, mallampati2;  Chest- clear w/o w/r/r;  Heart- RR w/o m/r/g;  Abd- obese, soft, nontender;  Ext- VI, leg ulcer/ dressing left leg w/ edema...  XRays of LEs> no osseous abn, no osteomyelitis, +vasc calcif in soft tissues, edema...   LABS from 02/2015- Sarah Horton brought by pt & reviewed...  IMP/PLAN>>  The pt brough FLMA forms from both her insurance company & her employer- [employer]: theses were completed and retruned to the pt to distribute;  Rxfor TRAMADOL 39m68mTid prn pain was written today;  She is rec to continue regular f/u w/ the wound center, [facility], Rheum, Endocrine, etc;  shewill f/u w/ me in 87mo 108mooner if needed...   ~  June 18, 2016:  57mo 23mo Sarah Horton Smacknts for a f/u visit after seeing SG 29mo ag35mor an acute visit to r/o kidney infection- c/o R back & flank pain, tried heat/ Voltaren/ Advil but no relief; she has psoriatic arthritis & treated by  DrDevesLadene ArtistPA w/ MTX/ Otezla/ Robaxin; we started Cipro500Bid & checked  urine (C&S was no growth);  Now she tells me that the back discomfort has been off & on for several months (thinks she had a fall as well)- using heat, musc relaxers, stretching; she notes the pain is worse w/ cough/ sneeze; she tells me that Rheum was concernedabout her kidneys & changed her meds; she went thru PT in spring of 2017 (finished 5/17); note that exam showed that she is tender in R post chest wall, no rash, lungs clear...    EXAM shows Afeb, VSS, O2sat=99% on RA;  HEENT- neg, mallampati2;  Chest- tender on palp R flank/ post chest wall;  Heart- RR w/o m/r/g;  Abd- obese, soft, neg;  Ext- VI, tr edema, no c/c...  CXR 06/18/16 (independently reviewed by me in the PACS system) showed norm heart size, clear lungs, NAD, and boney thorax is intact... IMP/PLAN>>  Sarah's right post chest discomfort appears to be a type of CWP- we discussed rest, heat, try Lidocaine patch, plus her Tramadol+Tylenol pain med; she will ret for CPX w/ EKG & FLabs...          Problem List:  ALLERGIC RHINITIS (ICD-477.9) - on XYZAL 36m/d,  NASONEX Qhs,  ASTELIN Prn... +allergy testing in past- trees & grasses mostly... ~  [facility] ~  She is followed by Sarah Horton for ENT w/ CT Sinuses 4/15 that was NEG...  BRONCHITIS, RECURRENT (ICD-491.9) - she's been doing well overall without recent infections etc... ~  CXR 11/11 showed normal heart size, clear lungs, healing fx left 6th rib posteriorly... ~  CXR 7/15 showed norm heart size, clear lungs, NAD (remote healed left 6th rib fx)  VENOUS INSUFFICIENCY (ICD-459.81) - she follows a low salt diet, elevates legs, and wears support hose Prn... she's had normal ArtDopplers and neg VenDopplers in the last 523yr.. ~  7/13 & 7/14:  She notes trophic changes & discoloration of the skin, no edema however... ~  12/14:  She presented w/ a sore over  the left medial malleolus slow to heal after a scratch; treated w/ cleaning bid, cover w/ Silvadene cream, no-stick pad and support hose... ~  5-7/15:  She had right great toe fx, seen by Podiatry & f/u wound care clinic... ~  9/16:  She presented afte fall w/ ulcer developing at medial malleolus of right leg- advised cleansing daily/ Silvadene cream/ Telfa dressing w/ paper tape/ elevate etc; Rx Keflex & Vicodin & refer to wound clinic if not responding... ~  1/17:  She now has ulcer on left leg-still followed in the wound care clinic & by [facility]-DrChen/Lawson...  HYPOTHYROIDISM, BORDERLINE (ICD-244.9) - followed by Sarah Horton on LEVOTHYROID 5036md;  Pt states tha she checks pt once per yr and does her thyroid labs... ~  Labs 4/13 by DrBWilder Gladeowed TSH= 1.98 ~  She continues to f/u w/ Endocrine, Sarah Horton w/ labs done in her office...  OBESITY (ICD-278.00) - she is 5' 7"  tall and BMI at 35-38 range... ~  weight 10/10 = 236# ~  weight 8/11 = 255# (BMI= 37-38) ~  Weight 6/12 = 211# (BMI= 33) ~  Weight 7/13 = 240# and she notes not exercising due to work & back... ~  Weight 7/14 = 238# ~  Weight 12/14 = 248# ~  Weight 7/15 = 251# and we reviewed diet, exercise, wt reduction strategies... ~  Weight 9/16 = 241# ~  Weight 1/17 = 235#  GERD (ICD-530.81) - she uses OTC Pepcid as needed...Marland KitchenMarland Kitchen  IRRITABLE BOWEL SYNDROME (ICD-564.1) - last colonoscopy 3/06 by Sarah Horton showed divertics, hems, prob IBS... f/u planned 66yr.  DEGENERATIVE JOINT DISEASE (ICD-715.90) - she has osteoarthritis and takes TYLENOL as needed (stopped Tramadol due to "dizzy & sick"). ~  11/11: she had bilat TKRs done by Sarah Horton> off work x 349molots of PT, & finally improved.  PSORIATIC ARTHROPATHY (ICD-696.0) - on ENBREL 5070mk, (off prev MTX since her knee surg 11/11)... followed by Sarah Horton locally [name] also [facility] [name] for [facility] ~  1/17:  She continues regular f/u & management by DrDLadene Artiste do  not have recent notes- pt informs me that her Enbrel is on hold due to leg wounds...  MIGRAINES, HX OF (ICD-V13.8) - on IMITREX Prn, PHENERGAN Prn... she is followed by Sarah Horton for the [facility]ache [facility] & used to take Atenolol 73m1mfor prevention but she notes much improved now (since off Premarin), est only 2-3 HAs per yr now & doesn't need the BBlocker she says...  PERIPHERAL NEUROPATHY (ICD-356.9) - she tried Neurontin but was INTOL "it made me stupid"...  ANXIETY (ICD-300.00) >> Adult ADD  ~  [facility]...  Health Maintenance - GYN= [name]'s office... prev on Premarin, off now w/ hot flashes controlled after consult w/ Sarah Horton w/ CLONIDINE 0.1mg/18m. [facility]/ [facility]- on Calcium, Vits, Vit D OTC... rec to take ASA 81mg/49m FLP's in past w/ borderline LDL~ 110 range- on diet Rx alone... ~  Labs 4/13 by Sarah Horton showed TChol 168, TG 108, HDL 82, LDL 64 ~  Immuniz> She had 2014 Flu vaccine 10/14;  Given TDAP 10/13;    Past Surgical History:  Procedure Laterality Date  . ABDOMINAL HYSTERECTOMY    . ABDOMINAL HYSTERECTOMY    . ABLATION ON ENDOMETRIOSIS     x2  . Bilat TKRs  04/2010   by Sarah Horton  . BUNIONECTOMY  2009  . CATARACT EXTRACTION, BILATERAL     2003,[id number]  08-26-2004   with gessner tics and hems   . dequervains tensenovitis Left   . JOINT REPLACEMENT Bilateral    knees  . NASAL SINUS SURGERY    . SHOULDER SURGERY  2005    Outpatient Encounter Prescriptions as of 06/18/2016  Medication Sig Dispense Refill  . amphetamine-dextroamphetamine (ADDERALL) 30 MG tablet Take 30 mg by mouth daily.    . ApreMarland Kitchenilast (OTEZLA) 30 MG TABS TAKE ONE TABLET (30 MG) BY MOUTH TWICE DAILY. TAKE WITH OR WITHOUT FOOD. DO NOT CRUSH, CHEW OR SPLIT TABLET. STORE AT ROOM TEMPERATURE. 180 tablet 0  . cetirizine (ZYRTEC) 10 MG tablet Take 10 mg by mouth daily.     . Cholecalciferol (VITAMIN D3) 1000 units CAPS Take by mouth.    . famotidine (PEPCID) 10 MG tablet Take 20 mg by mouth daily.     . lactMarland Kitchenbacillus acidophilus (BACID) TABS tablet Take 1 tablet by mouth daily.    . Levothyroxine Sodium 75 MCG CAPS Take 75 mcg by mouth daily.     . MAGNMarland KitchenSIUM MALATE PO Take by mouth.    . MAGNMarland KitchenSIUM PO Take 750 mg by mouth daily.    . methocarbamol (ROBAXIN) 500 MG tablet TAKE 1 TABLET BY MOUTH 3 TIMES A DAY AS NEEDED 90 tablet 2  . methotrexate (RHEUMATREX) 2.5 MG tablet TAKE 6 TABLETS ONCE A WEEK (Patient taking differently: TAKE 5 TABLETS ONCE A WEEK) 72  tablet 0  . montelukast (SINGULAIR) 10 MG tablet Take 10 mg by mouth at bedtime.     . Multiple Vitamin (MULTIVITAMIN) tablet Take 1 tablet by mouth daily.      . promethazine (PHENERGAN) 25 MG tablet Take 1 tablet (25 mg total) by mouth every 6 (six) hours as needed for nausea or vomiting. 30 tablet 2  . SALINE MIST SPRAY NA Place into the nose.    . SUMAtriptan (IMITREX) 20 MG/ACT nasal spray Place 1 spray (20 mg total) into the nose every 2 (two) hours as needed. 1 Inhaler 2  . traMADol (ULTRAM) 50 MG tablet Take 1 tablet (50 mg total) by mouth 2 (two) times daily as needed. 60 tablet 1  . [DISCONTINUED] ciprofloxacin (CIPRO) 500 MG tablet Take 1 tablet (500 mg total) by mouth 2 (two) times daily. (Patient not taking: Reported on 06/18/2016) 14 tablet 0  . [DISCONTINUED] diclofenac sodium (VOLTAREN) 1 % GEL Voltaren Gel 3 grams to 3 large joints upto TID 3 TUBES with 3 refills (Patient not taking: Reported on 06/18/2016) 3 Tube 3  . [DISCONTINUED] metroNIDAZOLE (METROGEL) 0.75 % gel Apply 1 application topically 2 (two) times daily.    . [DISCONTINUED] mupirocin ointment (BACTROBAN) 2 % Place 1 application into the nose 2 (two) times daily.    . [DISCONTINUED] Naproxen Sodium (ALEVE PO) Take 2 tablets by mouth 2 (two) times daily before a meal.     No facility-administered encounter medications on file as of  06/18/2016.     Allergies  Allergen Reactions  . Nucynta [Tapentadol]     Hives  . Codeine     REACTION: severe nausea by itself  . Penicillins     REACTION: hives all over    Review of Systems         See HPI - all other systems neg except as noted...  The patient complains of weight gain, dyspnea on exertion, and difficulty walking.  The patient denies anorexia, fever, weight loss, vision loss, decreased hearing, hoarseness, chest pain, syncope, peripheral edema, prolonged cough, headaches, hemoptysis, abdominal pain, melena, hematochezia, severe indigestion/heartburn, hematuria, incontinence, muscle weakness, suspicious skin lesions, transient blindness, depression, unusual weight change, abnormal bleeding, enlarged lymph nodes, and angioedema.     Objective:   Physical Exam     WD, Overweight, 66 y/o WF in NAD... GENERAL:  Alert & oriented; pleasant & cooperative... HEENT:  [place]/AT, EOM-wnl, PERRLA, EACs-clear, TMs-wnl, NOSE-clear, THROAT-clear & wnl. NECK:  Supple w/ fairROM; no JVD; normal carotid impulses w/o bruits; no thyromegaly or nodules palpated; no lymphadenopathy. CHEST:  Clear to P & A; without wheezes/ rales/ or rhonchi. HEART:  Regular Rhythm; without murmurs/ rubs/ or gallops. ABDOMEN:  Obese, soft & nontender; normal bowel sounds; no organomegaly or masses detected. EXT:  severe arthritic changes in knees; +varicose veins/ +venous insuffic/ tr edema. NEURO:  CN's intact; motor testing normal; sensory testing normal; gait normal & balance OK. DERM:  few min psoriatic patches, dependent rubor in legs, overriding right 2nd toe, onychomycosis...   RADIOLOGY DATA:  Reviewed in the EPIC EMR & discussed w/ the patient...  LABORATORY DATA:  Reviewed in the EPIC EMR & discussed w/ the patient...   Assessment & Plan:    Right posterior chest wall pain ?etiology>  We discussed ?Rx w/ rest, heat, Lidocaine patches, Tramadol+Tylenol rx...   AR/ Bronchitis>  Stable on  antihist, nasal steroid, etc; she c/o sinus drainage, cough, etc eval by ENT & Allergy teams...  Ven Insuffic>  She did beautifully after her knee surg, no DVT, swelling, etc; reminded to elev legs, wear support hose, etc... 1/17>  She tells me thatDrLawson is planning GSV surgery...  Venous stasis ulcer right medial malleolus area>> needs local therapy & wound care=> Rx w/ Keflex, Vicodin, Silvadene/ dressings/ etc; prev hammer toe surg, etc=> improved 1/17>  She now has lesion on left leg- wound care clinic, [facility], Rheum/ Derm...  HYPOTHYROID>  Followed by Sarah Horton on Synthroid50 & she does her labs...  OBESITY>  Fabulous job w/ wt down 47# to 211 in 2012, but unfortunately gained up to 250+# in 2013 & beyond...  GI>  GERD/ IBS>  follwed by Sarah Horton, up to date, & stable...  DJD/ PSORIATIC Arthritis>  follwed by Sarah Horton & Sarah Horton on ENBREL Rx...  Migraine HA>  Hx migraines but stable & quiescent; followed by Sarah Horton- she request refill IMITREX nasal sp...  Other medical problems as noted>>> pt will get [facility] at [facility] w/ copy to me...   Patient's Medications  New Prescriptions   TRAMADOL (ULTRAM) 50 MG TABLET    Take 1 tablet (50 mg total) by mouth 2 (two) times daily as needed.  Previous Medications   AMPHETAMINE-DEXTROAMPHETAMINE (ADDERALL) 30 MG TABLET    Take 30 mg by mouth daily.   APREMILAST (OTEZLA) 30 MG TABS    TAKE ONE TABLET (30 MG) BY MOUTH TWICE DAILY. TAKE WITH OR WITHOUT FOOD. DO NOT CRUSH, CHEW OR SPLIT TABLET. STORE AT ROOM TEMPERATURE.   CETIRIZINE (ZYRTEC) 10 MG TABLET    Take 10 mg by mouth daily.   CHOLECALCIFEROL (VITAMIN D3) 1000 UNITS CAPS    Take by mouth.   FAMOTIDINE (PEPCID) 10 MG TABLET    Take 20 mg by mouth daily.    LACTOBACILLUS ACIDOPHILUS (BACID) TABS TABLET    Take 1 tablet by mouth daily.   LEVOTHYROXINE SODIUM 75 MCG CAPS    Take 75 mcg by mouth daily.    MAGNESIUM MALATE PO    Take by mouth.   MAGNESIUM PO    Take 750 mg by mouth  daily.   METHOCARBAMOL (ROBAXIN) 500 MG TABLET    TAKE 1 TABLET BY MOUTH 3 TIMES A DAY AS NEEDED   METHOTREXATE (RHEUMATREX) 2.5 MG TABLET    TAKE 6 TABLETS ONCE A WEEK   MONTELUKAST (SINGULAIR) 10 MG TABLET    Take 10 mg by mouth at bedtime.    MULTIPLE VITAMIN (MULTIVITAMIN) TABLET    Take 1 tablet by mouth daily.     PROMETHAZINE (PHENERGAN) 25 MG TABLET    Take 1 tablet (25 mg total) by mouth every 6 (six) hours as needed for nausea or vomiting.   SALINE MIST SPRAY NA    Place into the nose.   SUMATRIPTAN (IMITREX) 20 MG/ACT NASAL SPRAY    Place 1 spray (20 mg total) into the nose every 2 (two) hours as needed.  Modified Medications   No medications on file  Discontinued Medications   CIPROFLOXACIN (CIPRO) 500 MG TABLET    Take 1 tablet (500 mg total) by mouth 2 (two) times daily.   DICLOFENAC SODIUM (VOLTAREN) 1 % GEL    Voltaren Gel 3 grams to 3 large joints upto TID 3 TUBES with 3 refills   METRONIDAZOLE (METROGEL) 0.75 % GEL    Apply 1 application topically 2 (two) times daily.   MUPIROCIN OINTMENT (BACTROBAN) 2 %    Place 1 application into the nose 2 (two) times daily.   NAPROXEN SODIUM (  ALEVE PO)    Take 2 tablets by mouth 2 (two) times daily before a meal.

## 2016-07-06 ENCOUNTER — Ambulatory Visit: Payer: Commercial Managed Care - HMO | Admitting: Pulmonary Disease

## 2016-07-16 ENCOUNTER — Ambulatory Visit: Payer: Commercial Managed Care - HMO | Admitting: Pulmonary Disease

## 2016-07-16 ENCOUNTER — Encounter: Payer: Self-pay | Admitting: Pulmonary Disease

## 2016-07-16 DIAGNOSIS — K589 Irritable bowel syndrome without diarrhea: Secondary | ICD-10-CM

## 2016-07-16 DIAGNOSIS — Z6833 Body mass index (BMI) 33.0-33.9, adult: Secondary | ICD-10-CM

## 2016-07-16 DIAGNOSIS — I83893 Varicose veins of bilateral lower extremities with other complications: Secondary | ICD-10-CM

## 2016-07-16 DIAGNOSIS — K219 Gastro-esophageal reflux disease without esophagitis: Secondary | ICD-10-CM

## 2016-07-16 DIAGNOSIS — M150 Primary generalized (osteo)arthritis: Secondary | ICD-10-CM

## 2016-07-16 DIAGNOSIS — R0789 Other chest pain: Secondary | ICD-10-CM | POA: Insufficient documentation

## 2016-07-16 DIAGNOSIS — J301 Allergic rhinitis due to pollen: Secondary | ICD-10-CM

## 2016-07-16 DIAGNOSIS — G8929 Other chronic pain: Secondary | ICD-10-CM

## 2016-07-16 DIAGNOSIS — L4050 Arthropathic psoriasis, unspecified: Secondary | ICD-10-CM

## 2016-07-16 DIAGNOSIS — G609 Hereditary and idiopathic neuropathy, unspecified: Secondary | ICD-10-CM

## 2016-07-16 DIAGNOSIS — E6609 Other obesity due to excess calories: Secondary | ICD-10-CM

## 2016-07-16 DIAGNOSIS — M159 Polyosteoarthritis, unspecified: Secondary | ICD-10-CM

## 2016-07-16 DIAGNOSIS — E039 Hypothyroidism, unspecified: Secondary | ICD-10-CM

## 2016-07-16 DIAGNOSIS — I872 Venous insufficiency (chronic) (peripheral): Secondary | ICD-10-CM

## 2016-07-16 DIAGNOSIS — M545 Low back pain: Secondary | ICD-10-CM

## 2016-08-01 ENCOUNTER — Other Ambulatory Visit: Payer: Self-pay | Admitting: Rheumatology

## 2016-08-03 NOTE — Telephone Encounter (Signed)
ok 

## 2016-08-03 NOTE — Telephone Encounter (Signed)
Last Visit: 04/10/16 Next Visit: 09/11/16  Okay to refill Methocarbamol?

## 2016-08-24 ENCOUNTER — Other Ambulatory Visit: Payer: Self-pay | Admitting: Rheumatology

## 2016-08-25 NOTE — Telephone Encounter (Signed)
Last Visit: 04/10/16 Next Visit: 09/11/16 Labs: 04/02/16 Creat 1.04 GFR 58  Okay to refill Otezla?

## 2016-09-03 DIAGNOSIS — L409 Psoriasis, unspecified: Secondary | ICD-10-CM | POA: Insufficient documentation

## 2016-09-03 NOTE — Progress Notes (Deleted)
Office Visit Note  Patient: Sarah Horton             Date of Birth: 1950/07/03           MRN: [id number]             PCP: Michele Mcalpine, MD Referring: Michele Mcalpine, MD Visit Date: 09/11/2016 Occupation: @GUAROCC @    Subjective:  No chief complaint on file.   History of Present Illness: Sarah Horton is a 66 y.o. female ***   Activities of Daily Living:  Patient reports morning stiffness for *** {minute/hour:19697}.   Patient {ACTIONS;DENIES/REPORTS:21021675::"Denies"} nocturnal pain.  Difficulty dressing/grooming: {ACTIONS;DENIES/REPORTS:21021675::"Denies"} Difficulty climbing stairs: {ACTIONS;DENIES/REPORTS:21021675::"Denies"} Difficulty getting out of chair: {ACTIONS;DENIES/REPORTS:21021675::"Denies"} Difficulty using hands for taps, buttons, cutlery, and/or writing: {ACTIONS;DENIES/REPORTS:21021675::"Denies"}   No Rheumatology ROS completed.   PMFS History:  Patient Active Problem List   Diagnosis Date Noted  . Psoriasis 09/03/2016  . Chest wall pain 07/16/2016  . Flank pain, acute 05/15/2016  . Subacromial bursitis of right shoulder joint 04/10/2016  . Low back pain 04/09/2016  . High risk medications (not anticoagulants) long-term use 04/09/2016  . Pre-ulcerative calluses 02/11/2016  . Varicose veins of right lower extremity with complications 08/12/2015  . Elevated BP 08/09/2015  . Varicose veins of left lower extremity with complications 07/29/2015  . Varicose veins of bilateral lower extremities with other complications 07/09/2015  . Varicose veins of lower extremities with ulcer (HCC) 04/05/2015  . Status post bilateral knee replacements 12/13/2013  . Venous stasis ulcer of ankle (HCC) 06/06/2013  . ADD (attention deficit disorder) 04/04/2012  . INSOMNIA 06/20/2010  . Hypothyroidism 01/17/2010  . Obesity 01/17/2010  . Osteoarthritis 01/17/2010  . ANEMIA 10/02/2009  . SINUSITIS, ACUTE 10/02/2009  . Asthma 10/02/2009  . Venous  (peripheral) insufficiency 03/11/2008  . Allergic rhinitis 03/11/2008  . BRONCHITIS, RECURRENT 03/11/2008  . MIGRAINES, HX OF 03/11/2008  . Anxiety state 11/24/2007  . Hereditary and idiopathic peripheral neuropathy 11/24/2007  . GERD 11/24/2007  . IRRITABLE BOWEL SYNDROME 11/24/2007  . PSORIATIC ARTHROPATHY 11/24/2007    Past Medical History:  Diagnosis Date  . Allergic rhinitis, cause unspecified   . Allergy   . Anemia, unspecified   . Anxiety state, unspecified   . Arthritis   . Cataract    bilaterally removed 2003,2007  . Esophageal reflux   . Irritable bowel syndrome   . Migraine   . Obesity, unspecified   . Psoriatic arthropathy (HCC)   . Unspecified chronic bronchitis   . Unspecified hereditary and idiopathic peripheral neuropathy   . Unspecified hypothyroidism   . Unspecified venous (peripheral) insufficiency     Family History  Problem Relation Age of Onset  . Cirrhosis Father   . COPD Mother   . Heart disease Mother   . Hypertension Mother   . Varicose Veins Mother   . Breast cancer Maternal Grandmother   . Breast cancer Other     1st cousin   . Pancreatic cancer Other     1st cousin   . Colon cancer Neg Hx   . Colon polyps Neg Hx   . Esophageal cancer Neg Hx   . Rectal cancer Neg Hx   . Stomach cancer Neg Hx    Past Surgical History:  Procedure Laterality Date  . ABDOMINAL HYSTERECTOMY    . ABDOMINAL HYSTERECTOMY    . ABLATION ON ENDOMETRIOSIS     x2  . Bilat TKRs  04/2010   by [name]  . BUNIONECTOMY  2009  .  CATARACT EXTRACTION, BILATERAL     [id number],2007  . COLONOSCOPY  08-26-2004   with gessner tics and hems   . dequervains tensenovitis Left   . JOINT REPLACEMENT Bilateral    knees  . NASAL SINUS SURGERY    . SHOULDER SURGERY  2005   Social History   Social History Narrative  . No narrative on file     Objective: Vital Signs: There were no vitals taken for this visit.   Physical Exam   Musculoskeletal Exam: ***  CDAI  Exam: No CDAI exam completed.    Investigation: Findings:  08/27/2015 negative Hepatitis Panel and TB gold   CBC    Component Value Date/Time   WBC 7.8 09/08/2016 1648   WBC 8.2 06/06/2013 1130   RBC 4.47 09/08/2016 1648   RBC 4.78 06/06/2013 1130   HGB 13.1 06/06/2013 1130   HCT 37.7 09/08/2016 1648   PLT 246 09/08/2016 1648   MCV 84 09/08/2016 1648   MCH 27.7 09/08/2016 1648   MCH 31.6 05/05/2010 0334   MCHC 32.9 09/08/2016 1648   MCHC 33.2 06/06/2013 1130   RDW 15.5 (H) 09/08/2016 1648   LYMPHSABS 2.5 09/08/2016 1648   MONOABS 1.0 06/06/2013 1130   EOSABS 0.1 09/08/2016 1648   BASOSABS 0.0 09/08/2016 1648   CMP     Component Value Date/Time   NA 142 09/08/2016 1648   K 4.6 09/08/2016 1648   CL 104 09/08/2016 1648   CO2 23 09/08/2016 1648   GLUCOSE 91 09/08/2016 1648   GLUCOSE 97 06/06/2013 1130   GLUCOSE 90 04/16/2006 1125   BUN 14 09/08/2016 1648   CREATININE 1.26 (H) 09/08/2016 1648   CALCIUM 9.2 09/08/2016 1648   PROT 6.7 09/08/2016 1648   ALBUMIN 4.2 09/08/2016 1648   AST 22 09/08/2016 1648   ALT 24 09/08/2016 1648   ALKPHOS 90 09/08/2016 1648   BILITOT 0.3 09/08/2016 1648   GFRNONAA 45 (L) 09/08/2016 1648   GFRAA 52 (L) 09/08/2016 1648     Imaging: No results found.  Speciality Comments: No specialty comments available.    Procedures:  No procedures performed Allergies: Nucynta [tapentadol]; Codeine; and Penicillins   Assessment / Plan:     Visit Diagnoses: Psoriatic arthropathy (HCC)  Psoriasis  High risk medications (not anticoagulants) long-term use - Methotrexate and Otezla   History of total knee replacement, bilateral  Hypothyroidism  Anxiety state  Obesity  Venous (peripheral) insufficiency  History of asthma  Venous stasis ulcer of right ankle    Patient also has Hypothyroidism; Obesity; Anemia; Anxiety; Hereditary and idiopathic peripheral neuropathy; Venous (peripheral) insufficiency; Sinusitis; Allergic rhinitis;  Bronchitis Asthma; GERD; Irritable Bowel Syndrome; Migraines, history of insomnia, ADD (attention deficit disorder); Venous stasis ulcer of ankle (HCC); Status post bilateral knee replacements; Varicose veins of lower extremities with ulcer (HCC); Varicose veins of bilateral lower extremities with other complications; Varicose veins of left lower extremity with complications; Elevated BP; Varicose veins of right lower extremity with complications; Pre-ulcerative calluses; Low back pain; High risk medications (not anticoagulants) long-term use; Subacromial bursitis of right shoulder joint; Flank pain, acute; and history of Chest wall pain; on her problem list.  Orders: No orders of the defined types were placed in this encounter.  No orders of the defined types were placed in this encounter.   Face-to-face time spent with patient was *** minutes. 50% of time was spent in counseling and coordination of care.  Follow-Up Instructions: No Follow-up on file.   Pollyann SavoyShaili Azelyn Batie, MD  Note - This record has been created using [employer].  Chart creation errors have been sought, but may not always  have been located. Such creation errors do not reflect on  the standard of medical care.

## 2016-09-08 ENCOUNTER — Telehealth: Payer: Self-pay | Admitting: Rheumatology

## 2016-09-08 ENCOUNTER — Other Ambulatory Visit: Payer: Self-pay | Admitting: *Deleted

## 2016-09-08 DIAGNOSIS — Z79899 Other long term (current) drug therapy: Secondary | ICD-10-CM

## 2016-09-08 NOTE — Telephone Encounter (Signed)
Please release orders for [facility]. Patient is there now for labs.   Fax# [phone]

## 2016-09-08 NOTE — Telephone Encounter (Signed)
Labs released and faxed  

## 2016-09-09 ENCOUNTER — Telehealth: Payer: Self-pay | Admitting: Rheumatology

## 2016-09-09 ENCOUNTER — Other Ambulatory Visit: Payer: Self-pay | Admitting: *Deleted

## 2016-09-09 DIAGNOSIS — Z79899 Other long term (current) drug therapy: Secondary | ICD-10-CM

## 2016-09-09 LAB — CMP14+EGFR
ALT: 24 IU/L (ref 0–32)
AST: 22 IU/L (ref 0–40)
Albumin/Globulin Ratio: 1.7 (ref 1.2–2.2)
Albumin: 4.2 g/dL (ref 3.6–4.8)
Alkaline Phosphatase: 90 IU/L (ref 39–117)
BUN/Creatinine Ratio: 11 — ABNORMAL LOW (ref 12–28)
BUN: 14 mg/dL (ref 8–27)
Bilirubin Total: 0.3 mg/dL (ref 0.0–1.2)
CALCIUM: 9.2 mg/dL (ref 8.7–10.3)
CHLORIDE: 104 mmol/L (ref 96–106)
CO2: 23 mmol/L (ref 18–29)
CREATININE: 1.26 mg/dL — AB (ref 0.57–1.00)
GFR, EST AFRICAN AMERICAN: 52 mL/min/{1.73_m2} — AB (ref >59)
GFR, EST NON AFRICAN AMERICAN: 45 mL/min/{1.73_m2} — AB (ref >59)
GLUCOSE: 91 mg/dL (ref 65–99)
Globulin, Total: 2.5 g/dL (ref 1.5–4.5)
Potassium: 4.6 mmol/L (ref 3.5–5.2)
Sodium: 142 mmol/L (ref 134–144)
TOTAL PROTEIN: 6.7 g/dL (ref 6.0–8.5)

## 2016-09-09 LAB — CBC WITH DIFFERENTIAL/PLATELET
BASOS ABS: 0 10*3/uL (ref 0.0–0.2)
BASOS: 0 %
EOS (ABSOLUTE): 0.1 10*3/uL (ref 0.0–0.4)
EOS: 2 %
HEMATOCRIT: 37.7 % (ref 34.0–46.6)
HEMOGLOBIN: 12.4 g/dL (ref 11.1–15.9)
IMMATURE GRANS (ABS): 0 10*3/uL (ref 0.0–0.1)
Immature Granulocytes: 0 %
LYMPHS: 32 %
Lymphocytes Absolute: 2.5 10*3/uL (ref 0.7–3.1)
MCH: 27.7 pg (ref 26.6–33.0)
MCHC: 32.9 g/dL (ref 31.5–35.7)
MCV: 84 fL (ref 79–97)
MONOCYTES: 8 %
Monocytes Absolute: 0.6 10*3/uL (ref 0.1–0.9)
NEUTROS ABS: 4.5 10*3/uL (ref 1.4–7.0)
Neutrophils: 58 %
PLATELETS: 246 10*3/uL (ref 150–379)
RBC: 4.47 x10E6/uL (ref 3.77–5.28)
RDW: 15.5 % — ABNORMAL HIGH (ref 12.3–15.4)
WBC: 7.8 10*3/uL (ref 3.4–10.8)

## 2016-09-09 NOTE — Telephone Encounter (Signed)
Sarah Horton from [facility] left a message stating they need a new standing order for this patient.  Fax #[phone].  Thank you.

## 2016-09-09 NOTE — Telephone Encounter (Signed)
Standing orders placed in computer and will release when patient goes to lab next time.

## 2016-09-09 NOTE — Progress Notes (Signed)
Mild elevation of creatinine. We'll continue to monitor

## 2016-09-11 ENCOUNTER — Ambulatory Visit: Payer: Commercial Managed Care - HMO | Admitting: Rheumatology

## 2016-09-22 DIAGNOSIS — Z872 Personal history of diseases of the skin and subcutaneous tissue: Secondary | ICD-10-CM | POA: Insufficient documentation

## 2016-09-22 DIAGNOSIS — I7300 Raynaud's syndrome without gangrene: Secondary | ICD-10-CM | POA: Insufficient documentation

## 2016-09-22 DIAGNOSIS — M8589 Other specified disorders of bone density and structure, multiple sites: Secondary | ICD-10-CM | POA: Insufficient documentation

## 2016-09-22 DIAGNOSIS — Z79899 Other long term (current) drug therapy: Secondary | ICD-10-CM | POA: Insufficient documentation

## 2016-09-22 NOTE — Progress Notes (Deleted)
Office Visit Note  Patient: Sarah Horton             Date of Birth: 02-26-1951           MRN: [id number]             PCP: Michele Mcalpine, MD Referring: Michele Mcalpine, MD Visit Date: 10/05/2016 Occupation: @    Subjective:  No chief complaint on file.   History of Present Illness: Sarah Horton is a 66 y.o. female ***   Activities of Daily Living:  Patient reports morning stiffness for *** {minute/hour:19697}.   Patient {ACTIONS;DENIES/REPORTS:21021675::"Denies"} nocturnal pain.  Difficulty dressing/grooming: {ACTIONS;DENIES/REPORTS:21021675::"Denies"} Difficulty climbing stairs: {ACTIONS;DENIES/REPORTS:21021675::"Denies"} Difficulty getting out of chair: {ACTIONS;DENIES/REPORTS:21021675::"Denies"} Difficulty using hands for taps, buttons, cutlery, and/or writing: {ACTIONS;DENIES/REPORTS:21021675::"Denies"}   No Rheumatology ROS completed.   PMFS History:  Patient Active Problem List   Diagnosis Date Noted  . Raynaud's disease without gangrene 09/22/2016  . High risk medication use 09/22/2016  . History of ulcer of lower limb/ Venous stasis ulcer 01/2015  09/22/2016  . Osteopenia of multiple sites 09/22/2016  . Psoriasis 09/03/2016  . Chest wall pain 07/16/2016  . Flank pain, acute 05/15/2016  . Subacromial bursitis of right shoulder joint 04/10/2016  . Low back pain 04/09/2016  . High risk medications (not anticoagulants) long-term use 04/09/2016  . Pre-ulcerative calluses 02/11/2016  . Varicose veins of right lower extremity with complications 08/12/2015  . Elevated BP 08/09/2015  . Varicose veins of left lower extremity with complications 07/29/2015  . Varicose veins of bilateral lower extremities with other complications 07/09/2015  . Varicose veins of lower extremities with ulcer (HCC) 04/05/2015  . Status post bilateral knee replacements 12/13/2013  . Venous stasis ulcer of ankle (HCC) 06/06/2013  . ADD (attention deficit disorder)  04/04/2012  . INSOMNIA 06/20/2010  . Hypothyroidism 01/17/2010  . Obesity 01/17/2010  . Osteoarthritis 01/17/2010  . ANEMIA 10/02/2009  . SINUSITIS, ACUTE 10/02/2009  . Asthma 10/02/2009  . Venous (peripheral) insufficiency 03/11/2008  . Allergic rhinitis 03/11/2008  . BRONCHITIS, RECURRENT 03/11/2008  . History of migraine 03/11/2008  . Anxiety state 11/24/2007  . Hereditary and idiopathic peripheral neuropathy 11/24/2007  . GERD 11/24/2007  . IRRITABLE BOWEL SYNDROME 11/24/2007  . PSORIATIC ARTHROPATHY 11/24/2007    Past Medical History:  Diagnosis Date  . Allergic rhinitis, cause unspecified   . Allergy   . Anemia, unspecified   . Anxiety state, unspecified   . Arthritis   . Cataract    bilaterally removed 2003,2007  . Esophageal reflux   . Irritable bowel syndrome   . Migraine   . Obesity, unspecified   . Psoriatic arthropathy (HCC)   . Unspecified chronic bronchitis   . Unspecified hereditary and idiopathic peripheral neuropathy   . Unspecified hypothyroidism   . Unspecified venous (peripheral) insufficiency     Family History  Problem Relation Age of Onset  . Cirrhosis Father   . COPD Mother   . Heart disease Mother   . Hypertension Mother   . Varicose Veins Mother   . Breast cancer Maternal Grandmother   . Breast cancer Other     1st cousin   . Pancreatic cancer Other     1st cousin   . Colon cancer Neg Hx   . Colon polyps Neg Hx   . Esophageal cancer Neg Hx   . Rectal cancer Neg Hx   . Stomach cancer Neg Hx    Past Surgical History:  Procedure Laterality Date  . ABDOMINAL  HYSTERECTOMY    . ABDOMINAL HYSTERECTOMY    . ABLATION ON ENDOMETRIOSIS     x2  . Bilat TKRs  04/2010   by [name]  . BUNIONECTOMY  2009  . CATARACT EXTRACTION, BILATERAL     [id number]  . COLONOSCOPY  08-26-2004   with gessner tics and hems   . dequervains tensenovitis Left   . JOINT REPLACEMENT Bilateral    knees  . NASAL SINUS SURGERY    . SHOULDER SURGERY  2005    Social History   Social History Narrative  . No narrative on file     Objective: Vital Signs: There were no vitals taken for this visit.   Physical Exam   Musculoskeletal Exam: ***  CDAI Exam: No CDAI exam completed.   Orders Only on 09/08/2016  Component Date Value Ref Range Status  . WBC 09/08/2016 7.8  3.4 - 10.8 x10E3/uL Final  . RBC 09/08/2016 4.47  3.77 - 5.28 x10E6/uL Final  . Hemoglobin 09/08/2016 12.4  11.1 - 15.9 g/dL Final  . Hematocrit 95/63/8756 37.7  34.0 - 46.6 % Final  . MCV 09/08/2016 84  79 - 97 fL Final  . MCH 09/08/2016 27.7  26.6 - 33.0 pg Final  . MCHC 09/08/2016 32.9  31.5 - 35.7 g/dL Final  . RDW 43/32/9518 15.5* 12.3 - 15.4 % Final  . Platelets 09/08/2016 246  150 - 379 x10E3/uL Final  . Neutrophils 09/08/2016 58  Not Estab. % Final  . Lymphs 09/08/2016 32  Not Estab. % Final  . Monocytes 09/08/2016 8  Not Estab. % Final  . Eos 09/08/2016 2  Not Estab. % Final  . Basos 09/08/2016 0  Not Estab. % Final  . Neutrophils Absolute 09/08/2016 4.5  1.4 - 7.0 x10E3/uL Final  . Lymphocytes Absolute 09/08/2016 2.5  0.7 - 3.1 x10E3/uL Final  . Monocytes Absolute 09/08/2016 0.6  0.1 - 0.9 x10E3/uL Final  . EOS (ABSOLUTE) 09/08/2016 0.1  0.0 - 0.4 x10E3/uL Final  . Basophils Absolute 09/08/2016 0.0  0.0 - 0.2 x10E3/uL Final  . Immature Granulocytes 09/08/2016 0  Not Estab. % Final  . Immature Grans (Abs) 09/08/2016 0.0  0.0 - 0.1 x10E3/uL Final  . Glucose 09/08/2016 91  65 - 99 mg/dL Final  . BUN 84/16/6063 14  8 - 27 mg/dL Final  . Creatinine, Ser 09/08/2016 1.26* 0.57 - 1.00 mg/dL Final  . GFR calc non Af Amer 09/08/2016 45* >59 mL/min/1.73 Final  . GFR calc Af Amer 09/08/2016 52* >59 mL/min/1.73 Final  . BUN/Creatinine Ratio 09/08/2016 11* 12 - 28 Final  . Sodium 09/08/2016 142  134 - 144 mmol/L Final  . Potassium 09/08/2016 4.6  3.5 - 5.2 mmol/L Final  . Chloride 09/08/2016 104  96 - 106 mmol/L Final  . CO2 09/08/2016 23  18 - 29 mmol/L Final  .  Calcium 09/08/2016 9.2  8.7 - 10.3 mg/dL Final  . Total Protein 09/08/2016 6.7  6.0 - 8.5 g/dL Final  . Albumin 01/60/1093 4.2  3.6 - 4.8 g/dL Final  . Globulin, Total 09/08/2016 2.5  1.5 - 4.5 g/dL Final  . Albumin/Globulin Ratio 09/08/2016 1.7  1.2 - 2.2 Final  . Bilirubin Total 09/08/2016 0.3  0.0 - 1.2 mg/dL Final  . Alkaline Phosphatase 09/08/2016 90  39 - 117 IU/L Final  . AST 09/08/2016 22  0 - 40 IU/L Final  . ALT 09/08/2016 24  0 - 32 IU/L Final    Investigation: No additional findings.  Imaging: No results found.  Speciality Comments: No specialty comments available.    Procedures:  No procedures performed Allergies: Nucynta [tapentadol]; Codeine; and Penicillins   Assessment / Plan:     Visit Diagnoses: Psoriatic arthropathy (HCC)  Psoriasis  Raynaud's disease without gangrene  High risk medication use - Methotrexate, folic acid, otezla  Subacromial bursitis of right shoulder joint  Status post bilateral knee replacements  History of migraine  History of anxiety  History of asthma  History of IBS  History of gastroesophageal reflux (GERD)  History of peripheral neuropathy  History of anemia  History of hypothyroidism  History of ulcer of lower limb/ Venous stasis ulcer 01/2015   Osteopenia of multiple sites    Orders: No orders of the defined types were placed in this encounter.  No orders of the defined types were placed in this encounter.   Face-to-face time spent with patient was *** minutes. 50% of time was spent in counseling and coordination of care.  Follow-Up Instructions: No Follow-up on file.   Pollyann Savoy, MD  Note - This record has been created using [employer].  Chart creation errors have been sought, but may not always  have been located. Such creation errors do not reflect on  the standard of medical care.

## 2016-09-29 ENCOUNTER — Other Ambulatory Visit: Payer: Self-pay | Admitting: Rheumatology

## 2016-09-30 NOTE — Telephone Encounter (Signed)
04/10/16 last visit 10/05/16 next visit  No lab monitor required for [facility] to refill per Dr Corliss Skains

## 2016-10-05 ENCOUNTER — Ambulatory Visit: Payer: Commercial Managed Care - HMO | Admitting: Rheumatology

## 2016-10-18 ENCOUNTER — Other Ambulatory Visit: Payer: Self-pay | Admitting: Rheumatology

## 2016-10-19 NOTE — Telephone Encounter (Signed)
last visit11/3/17 Next Visit due April 2018. Message sent to the front to schedule patient. Labs 4//18 Creat. 1.26 Previously 1.04 GFR 45 previously 58  Okay to refill MTX?

## 2016-10-19 NOTE — Telephone Encounter (Signed)
Decrease MTX to 3 tabs po q week

## 2016-10-21 ENCOUNTER — Telehealth: Payer: Self-pay | Admitting: Rheumatology

## 2016-10-21 NOTE — Telephone Encounter (Signed)
Clarified MTX directions are 3 tabs per week.

## 2016-10-21 NOTE — Telephone Encounter (Signed)
Pharmacy calling to  Clarify MTX directions. Please call to advise.

## 2016-11-12 ENCOUNTER — Ambulatory Visit: Payer: Commercial Managed Care - HMO | Admitting: Rheumatology

## 2016-11-20 DIAGNOSIS — Z8669 Personal history of other diseases of the nervous system and sense organs: Secondary | ICD-10-CM | POA: Insufficient documentation

## 2016-11-20 DIAGNOSIS — M778 Other enthesopathies, not elsewhere classified: Secondary | ICD-10-CM | POA: Insufficient documentation

## 2016-11-20 NOTE — Progress Notes (Signed)
Office Visit Note  Patient: Sarah Horton             Date of Birth: 30-Dec-1950           MRN: [id number]             PCP: Michele Mcalpine, MD Referring: Michele Mcalpine, MD Visit Date: 11/24/2016 Occupation: @GUAROCC @    Subjective:  Lower back pain.   History of Present Illness: Sarah HAVLICEK is a 66 y.o. female with history of psoriatic arthritis and psoriasis. She states she's been having some discomfort in her lower back. She denies any joint swelling. Her psoriasis is fairly well controlled. She's been tolerating Otezla and methotrexate well.  Activities of Daily Living:  Patient reports morning stiffness for 10 minutes.   Patient Reports nocturnal pain.  Difficulty dressing/grooming: Denies Difficulty climbing stairs: Reports Difficulty getting out of chair: Reports Difficulty using hands for taps, buttons, cutlery, and/or writing: Denies   Review of Systems  Constitutional: Positive for fatigue. Negative for night sweats, weight gain, weight loss and weakness.  HENT: Negative for mouth sores, trouble swallowing, trouble swallowing, mouth dryness and nose dryness.   Eyes: Negative for pain, redness, visual disturbance and dryness.  Respiratory: Negative for cough, shortness of breath and difficulty breathing.   Cardiovascular: Negative for chest pain, palpitations, hypertension, irregular heartbeat and swelling in legs/feet.  Gastrointestinal: Negative for blood in stool, constipation and diarrhea.  Endocrine: Negative for increased urination.  Genitourinary: Negative for vaginal dryness.  Musculoskeletal: Positive for arthralgias, joint pain and morning stiffness. Negative for joint swelling, myalgias, muscle weakness, muscle tenderness and myalgias.  Skin: Positive for rash. Negative for color change, hair loss, skin tightness, ulcers and sensitivity to sunlight.       psoriasis  Allergic/Immunologic: Negative for susceptible to infections.    Neurological: Negative for dizziness, memory loss and night sweats.  Hematological: Negative for swollen glands.  Psychiatric/Behavioral: Positive for sleep disturbance. Negative for depressed mood. The patient is not nervous/anxious.     PMFS History:  Patient Active Problem List   Diagnosis Date Noted  . History of peripheral neuropathy 11/20/2016  . Left wrist tendonitis/ history of surgery for Dequervains  11/20/2016  . Raynaud's disease without gangrene 09/22/2016  . High risk medication use 09/22/2016  . History of ulcer of lower limb/ Venous stasis ulcer 01/2015  09/22/2016  . Osteopenia of multiple sites 09/22/2016  . Psoriasis 09/03/2016  . Chest wall pain 07/16/2016  . Flank pain, acute 05/15/2016  . Subacromial bursitis of right shoulder joint 04/10/2016  . Low back pain 04/09/2016  . Pre-ulcerative calluses 02/11/2016  . Varicose veins of right lower extremity with complications 08/12/2015  . Elevated BP 08/09/2015  . Varicose veins of left lower extremity with complications 07/29/2015  . Varicose veins of bilateral lower extremities with other complications 07/09/2015  . Varicose veins of lower extremities with ulcer (HCC) 04/05/2015  . Status post bilateral knee replacements 12/13/2013  . Venous stasis ulcer of ankle (HCC) 06/06/2013  . ADD (attention deficit disorder) 04/04/2012  . INSOMNIA 06/20/2010  . Hypothyroidism 01/17/2010  . Obesity 01/17/2010  . Osteoarthritis 01/17/2010  . ANEMIA 10/02/2009  . SINUSITIS, ACUTE 10/02/2009  . Asthma 10/02/2009  . Venous (peripheral) insufficiency 03/11/2008  . Allergic rhinitis 03/11/2008  . BRONCHITIS, RECURRENT 03/11/2008  . History of migraine 03/11/2008  . Anxiety state 11/24/2007  . Hereditary and idiopathic peripheral neuropathy 11/24/2007  . GERD 11/24/2007  . IRRITABLE BOWEL SYNDROME 11/24/2007  .  PSORIATIC ARTHROPATHY 11/24/2007    Past Medical History:  Diagnosis Date  . Allergic rhinitis, cause  unspecified   . Allergy   . Anemia, unspecified   . Anxiety state, unspecified   . Arthritis   . Cataract    bilaterally removed 2003,2007  . Esophageal reflux   . Irritable bowel syndrome   . Migraine   . Obesity, unspecified   . Psoriatic arthropathy (HCC)   . Unspecified chronic bronchitis (HCC)   . Unspecified hereditary and idiopathic peripheral neuropathy   . Unspecified hypothyroidism   . Unspecified venous (peripheral) insufficiency     Family History  Problem Relation Age of Onset  . Cirrhosis Father   . COPD Mother   . Heart disease Mother   . Hypertension Mother   . Varicose Veins Mother   . Breast cancer Maternal Grandmother   . Breast cancer Other        1st cousin   . Pancreatic cancer Other        1st cousin   . Colon cancer Neg Hx   . Colon polyps Neg Hx   . Esophageal cancer Neg Hx   . Rectal cancer Neg Hx   . Stomach cancer Neg Hx    Past Surgical History:  Procedure Laterality Date  . ABDOMINAL HYSTERECTOMY    . ABDOMINAL HYSTERECTOMY    . ABLATION ON ENDOMETRIOSIS     x2  . Bilat TKRs  04/2010   by [name]  . BUNIONECTOMY  2009  . CATARACT EXTRACTION, BILATERAL     [id number],2007  . COLONOSCOPY  08-26-2004   with gessner tics and hems   . dequervains tensenovitis Left   . JOINT REPLACEMENT Bilateral    knees  . NASAL SINUS SURGERY    . SHOULDER SURGERY  2005   Social History   Social History Narrative  . No narrative on file     Objective: Vital Signs: BP 133/76   Pulse 76   Ht 5' 8.5" (1.74 m)   Wt 224 lb (101.6 kg)   BMI 33.56 kg/m    Physical Exam  Constitutional: She is oriented to person, place, and time. She appears well-developed and well-nourished.  HENT:  Head: Normocephalic and atraumatic.  Eyes: Conjunctivae and EOM are normal.  Neck: Normal range of motion.  Cardiovascular: Normal rate, regular rhythm, normal heart sounds and intact distal pulses.   Pulmonary/Chest: Effort normal and breath sounds normal.    Abdominal: Soft. Bowel sounds are normal.  Lymphadenopathy:    She has no cervical adenopathy.  Neurological: She is alert and oriented to person, place, and time.  Skin: Skin is warm and dry. Capillary refill takes less than 2 seconds.  Psychiatric: She has a normal mood and affect. Her behavior is normal.  Nursing note and vitals reviewed.    Musculoskeletal Exam: C-spine and thoracic lumbar spine good range of motion. Shoulder joints elbow joints wrist joint MCPs PIPs DIPs with good range of motion. She has some thickening of PIP/DIP joints with no synovitis. There was no active psoriasis lesions. Hip joints knee joints ankles up to these PIPs DIPs with good range of motion with no synovitis.  CDAI Exam: CDAI Homunculus Exam:   Joint Counts:  CDAI Tender Joint count: 0 CDAI Swollen Joint count: 0  Global Assessments:  Patient Global Assessment: 3 Provider Global Assessment: 3  CDAI Calculated Score: 6    Investigation: Findings:   TB Gold is negative as of June 2016.   CBC  Latest Ref Rng & Units 09/08/2016 04/02/2016 06/06/2013  WBC 3.4 - 10.8 x10E3/uL 7.8 8.8 8.2  Hemoglobin 11.1 - 15.9 g/dL 40.9 81.1 91.4  Hematocrit 34.0 - 46.6 % 37.7 38.3 39.6  Platelets 150 - 379 x10E3/uL 246 249 197.0     CMP Latest Ref Rng & Units 09/08/2016 04/02/2016 06/06/2013  Glucose 65 - 99 mg/dL 91 91 97  BUN 8 - 27 mg/dL 14 17 18   Creatinine 0.57 - 1.00 mg/dL 7.82(N) 5.62(Z) 1.0  Sodium 134 - 144 mmol/L 142 142 139  Potassium 3.5 - 5.2 mmol/L 4.6 4.7 4.7  Chloride 96 - 106 mmol/L 104 104 107  CO2 18 - 29 mmol/L 23 21 25   Calcium 8.7 - 10.3 mg/dL 9.2 9.2 8.9  Total Protein 6.0 - 8.5 g/dL 6.7 7.1 7.2  Total Bilirubin 0.0 - 1.2 mg/dL 0.3 0.4 0.5  Alkaline Phos 39 - 117 IU/L 90 85 72  AST 0 - 40 IU/L 22 26 19   ALT 0 - 32 IU/L 24 30 18      Imaging: No results found.  Speciality Comments: No specialty comments available.    Procedures:  No procedures performed Allergies:  Nucynta [tapentadol]; Codeine; and Penicillins   Assessment / Plan:     Visit Diagnoses: PSORIATIC ARTHROPATHY: Patient has no active synovitis on examination she appears to be doing quite well on current combination of medications.  Psoriasis: She has no active psoriasis lesions.  High risk medication use - Otezla BID, methotrexate 3 tablets by mouth every week, folic acid negative by mouth daily. Her labs are normal. We will check her labs every 3 months to monitor for drug toxicity.  Raynaud's disease without gangrene: Currently not active   History of total knee replacement, bilateral: Doing well  History of ulcer of lower limb/ Venous stasis ulcer 01/2015 : No recurrence  Osteopenia of multiple sites: She is on calcium and vitamin D.  Primary insomnia: Good sleep hygiene discussed. Class 1 obesity due to excess calories with body mass index (BMI) of 33.0 to 33.9 in adult, unspecified whether serious comorbidity present: Weight loss diet and exercise was discussed.  Chronic midline low back pain without sciatica: She has chronic pain . However uncertain if it's related to her disc disease or psoriatic arthritis  Subacromial bursitis of right shoulder joint: Resolved  Other medical problems are listed as follows:  History of migraine  History of hypothyroidism  History of gastroesophageal reflux (GERD)  History of asthma  History of IBS  History of hypertension  History of peripheral neuropathy    Orders: No orders of the defined types were placed in this encounter.  No orders of the defined types were placed in this encounter.   Face-to-face time spent with patient was 30 minutes. 50% of time was spent in counseling and coordination of care.  Follow-Up Instructions: Return for Psoriatic arthritis Psoriasis.   Pollyann Savoy, MD  Note - This record has been created using Animal nutritionist.  Chart creation errors have been sought, but may not always  have been  located. Such creation errors do not reflect on  the standard of medical care.

## 2016-11-23 ENCOUNTER — Other Ambulatory Visit: Payer: Self-pay | Admitting: Rheumatology

## 2016-11-23 NOTE — Telephone Encounter (Signed)
Last Visit: 04/10/16 Next Visit: 11/24/16  Okay to refill Folic acid?

## 2016-11-24 ENCOUNTER — Encounter: Payer: Self-pay | Admitting: Rheumatology

## 2016-11-24 ENCOUNTER — Ambulatory Visit (INDEPENDENT_AMBULATORY_CARE_PROVIDER_SITE_OTHER): Payer: Commercial Managed Care - HMO | Admitting: Rheumatology

## 2016-11-24 VITALS — BP 133/76 | HR 76 | Ht 68.5 in | Wt 224.0 lb

## 2016-11-24 DIAGNOSIS — Z79899 Other long term (current) drug therapy: Secondary | ICD-10-CM | POA: Diagnosis not present

## 2016-11-24 DIAGNOSIS — Z6833 Body mass index (BMI) 33.0-33.9, adult: Secondary | ICD-10-CM

## 2016-11-24 DIAGNOSIS — L4050 Arthropathic psoriasis, unspecified: Secondary | ICD-10-CM

## 2016-11-24 DIAGNOSIS — M8589 Other specified disorders of bone density and structure, multiple sites: Secondary | ICD-10-CM

## 2016-11-24 DIAGNOSIS — Z96653 Presence of artificial knee joint, bilateral: Secondary | ICD-10-CM

## 2016-11-24 DIAGNOSIS — I7300 Raynaud's syndrome without gangrene: Secondary | ICD-10-CM

## 2016-11-24 DIAGNOSIS — Z8679 Personal history of other diseases of the circulatory system: Secondary | ICD-10-CM

## 2016-11-24 DIAGNOSIS — Z8719 Personal history of other diseases of the digestive system: Secondary | ICD-10-CM

## 2016-11-24 DIAGNOSIS — M545 Low back pain: Secondary | ICD-10-CM

## 2016-11-24 DIAGNOSIS — L409 Psoriasis, unspecified: Secondary | ICD-10-CM

## 2016-11-24 DIAGNOSIS — G8929 Other chronic pain: Secondary | ICD-10-CM

## 2016-11-24 DIAGNOSIS — Z8669 Personal history of other diseases of the nervous system and sense organs: Secondary | ICD-10-CM

## 2016-11-24 DIAGNOSIS — E6609 Other obesity due to excess calories: Secondary | ICD-10-CM

## 2016-11-24 DIAGNOSIS — Z8639 Personal history of other endocrine, nutritional and metabolic disease: Secondary | ICD-10-CM

## 2016-11-24 DIAGNOSIS — Z872 Personal history of diseases of the skin and subcutaneous tissue: Secondary | ICD-10-CM

## 2016-11-24 DIAGNOSIS — Z8709 Personal history of other diseases of the respiratory system: Secondary | ICD-10-CM

## 2016-11-24 DIAGNOSIS — F5101 Primary insomnia: Secondary | ICD-10-CM

## 2016-11-24 NOTE — Patient Instructions (Signed)
Standing Labs We placed an order today for your standing lab work.    Please come back and get your standing labs in Juy and every 3 months  We have open lab Monday through Friday from 8:30-11:30 AM and 1:30-4 PM at the office of Dr. Arbutus PedShaili Renad Jenniges/Naitik Panwala, PA.   The office is located at [address], [address], [place], [place] [address] No appointment is necessary.   Labs are drawn by [facility].  You may receive a bill from [facility] for your lab work. If you have any questions regarding directions or hours of operation,  please call [phone].

## 2016-11-25 ENCOUNTER — Other Ambulatory Visit: Payer: Self-pay | Admitting: Rheumatology

## 2016-11-26 NOTE — Telephone Encounter (Signed)
ok 

## 2016-11-26 NOTE — Telephone Encounter (Signed)
Last Visit: 11/24/16 Next visit: 04/28/17  Okay to refill Methocarbamol?

## 2017-01-18 ENCOUNTER — Telehealth: Payer: Self-pay | Admitting: Rheumatology

## 2017-01-18 ENCOUNTER — Other Ambulatory Visit: Payer: Self-pay | Admitting: Rheumatology

## 2017-01-18 NOTE — Telephone Encounter (Signed)
FYI patient will e in on Monday 01/25/17 for labs, not Friday.

## 2017-01-18 NOTE — Telephone Encounter (Signed)
ok 

## 2017-01-18 NOTE — Telephone Encounter (Signed)
Last Visit: 11/24/16 Next visit: 04/28/17 Labs: 09/08/16 Mild elevation in Creat.   Patient will update labs on Friday.  Okay to refill 30 day supply?

## 2017-01-25 ENCOUNTER — Telehealth: Payer: Self-pay | Admitting: Radiology

## 2017-01-25 DIAGNOSIS — Z79899 Other long term (current) drug therapy: Secondary | ICD-10-CM

## 2017-01-25 LAB — CBC WITH DIFFERENTIAL/PLATELET
Basophils Absolute: 0 cells/uL (ref 0–200)
Basophils Relative: 0 %
EOS PCT: 2 %
Eosinophils Absolute: 184 cells/uL (ref 15–500)
HCT: 38.8 % (ref 35.0–45.0)
Hemoglobin: 12.6 g/dL (ref 11.7–15.5)
LYMPHS ABS: 2576 {cells}/uL (ref 850–3900)
LYMPHS PCT: 28 %
MCH: 27.6 pg (ref 27.0–33.0)
MCHC: 32.5 g/dL (ref 32.0–36.0)
MCV: 84.9 fL (ref 80.0–100.0)
MONOS PCT: 8 %
MPV: 9.5 fL (ref 7.5–12.5)
Monocytes Absolute: 736 cells/uL (ref 200–950)
NEUTROS PCT: 62 %
Neutro Abs: 5704 cells/uL (ref 1500–7800)
PLATELETS: 219 10*3/uL (ref 140–400)
RBC: 4.57 MIL/uL (ref 3.80–5.10)
RDW: 15.5 % — AB (ref 11.0–15.0)
WBC: 9.2 10*3/uL (ref 3.8–10.8)

## 2017-01-25 NOTE — Addendum Note (Signed)
Addended byCaffie Damme on: 01/25/2017 04:11 PM   Modules accepted: Orders

## 2017-01-26 LAB — COMPLETE METABOLIC PANEL WITH GFR
ALK PHOS: 87 U/L (ref 33–130)
ALT: 25 U/L (ref 6–29)
AST: 21 U/L (ref 10–35)
Albumin: 4.1 g/dL (ref 3.6–5.1)
BUN: 15 mg/dL (ref 7–25)
CHLORIDE: 108 mmol/L (ref 98–110)
CO2: 22 mmol/L (ref 20–32)
Calcium: 8.9 mg/dL (ref 8.6–10.4)
Creat: 1.1 mg/dL — ABNORMAL HIGH (ref 0.50–0.99)
GFR, EST NON AFRICAN AMERICAN: 54 mL/min — AB (ref >=60)
GFR, Est African American: 62 mL/min (ref >=60)
GLUCOSE: 94 mg/dL (ref 65–99)
POTASSIUM: 4.4 mmol/L (ref 3.5–5.3)
SODIUM: 140 mmol/L (ref 135–146)
Total Bilirubin: 0.4 mg/dL (ref 0.2–1.2)
Total Protein: 6.5 g/dL (ref 6.1–8.1)

## 2017-01-26 NOTE — Telephone Encounter (Signed)
Labs are stable.

## 2017-02-12 ENCOUNTER — Other Ambulatory Visit: Payer: Self-pay | Admitting: *Deleted

## 2017-02-12 MED ORDER — METAXALONE 800 MG PO TABS: 800.0000 mg | ORAL_TABLET | Freq: Three times a day (TID) | ORAL | 2 refills | Status: DC

## 2017-02-12 NOTE — Telephone Encounter (Signed)
Fax received regarding Methocarbamol being on back order and not available. Request a medication change. Per Dr. Corliss Skainseveshwar okay to send prescription for Skelaxin.

## 2017-03-03 ENCOUNTER — Other Ambulatory Visit: Payer: Self-pay | Admitting: Rheumatology

## 2017-03-03 NOTE — Telephone Encounter (Signed)
Last Visit: 11/24/16 Next visit: 04/28/17 Labs: 01/25/17 Stable  Okay to refill per Dr. Deveshwar 

## 2017-04-09 ENCOUNTER — Other Ambulatory Visit: Payer: Self-pay | Admitting: Rheumatology

## 2017-04-12 NOTE — Telephone Encounter (Signed)
Last Visit: 11/24/16 Next visit: 04/28/17  Okay to refill per Dr. Corliss Skainseveshwar

## 2017-04-13 ENCOUNTER — Other Ambulatory Visit: Payer: Self-pay | Admitting: Rheumatology

## 2017-04-13 NOTE — Telephone Encounter (Signed)
Last Visit: 11/24/16 Next visit: 04/28/17 Labs: 01/25/17 Stable  Okay to refill per Dr. Corliss Skainseveshwar

## 2017-04-28 ENCOUNTER — Ambulatory Visit: Payer: Commercial Managed Care - HMO | Admitting: Rheumatology

## 2017-05-08 ENCOUNTER — Other Ambulatory Visit: Payer: Self-pay | Admitting: Rheumatology

## 2017-05-08 NOTE — Progress Notes (Deleted)
Office Visit Note  Patient: Sarah Horton             Date of Birth: 07-21-1950           MRN: [id number]             PCP: Michele McalpineNadel, Scott M, MD Referring: Michele McalpineNadel, Scott M, MD Visit Date: 05/20/2017 Occupation: @GUAROCC @    Subjective:  No chief complaint on file.   History of Present Illness: Sarah Horton is a 66 y.o. female ***   Activities of Daily Living:  Patient reports morning stiffness for *** {minute/hour:19697}.   Patient {ACTIONS;DENIES/REPORTS:21021675::"Denies"} nocturnal pain.  Difficulty dressing/grooming: {ACTIONS;DENIES/REPORTS:21021675::"Denies"} Difficulty climbing stairs: {ACTIONS;DENIES/REPORTS:21021675::"Denies"} Difficulty getting out of chair: {ACTIONS;DENIES/REPORTS:21021675::"Denies"} Difficulty using hands for taps, buttons, cutlery, and/or writing: {ACTIONS;DENIES/REPORTS:21021675::"Denies"}   No Rheumatology ROS completed.   PMFS History:  Patient Active Problem List   Diagnosis Date Noted  . History of peripheral neuropathy 11/20/2016  . Left wrist tendonitis/ history of surgery for Dequervains  11/20/2016  . Raynaud's disease without gangrene 09/22/2016  . High risk medication use 09/22/2016  . History of ulcer of lower limb/ Venous stasis ulcer 01/2015  09/22/2016  . Osteopenia of multiple sites 09/22/2016  . Psoriasis 09/03/2016  . Chest wall pain 07/16/2016  . Flank pain, acute 05/15/2016  . Subacromial bursitis of right shoulder joint 04/10/2016  . Low back pain 04/09/2016  . Pre-ulcerative calluses 02/11/2016  . Varicose veins of right lower extremity with complications 08/12/2015  . Elevated BP 08/09/2015  . Varicose veins of left lower extremity with complications 07/29/2015  . Varicose veins of bilateral lower extremities with other complications 07/09/2015  . Varicose veins of lower extremities with ulcer (HCC) 04/05/2015  . Status post bilateral knee replacements 12/13/2013  . Venous stasis ulcer of ankle (HCC)  06/06/2013  . ADD (attention deficit disorder) 04/04/2012  . INSOMNIA 06/20/2010  . Hypothyroidism 01/17/2010  . Obesity 01/17/2010  . Osteoarthritis 01/17/2010  . ANEMIA 10/02/2009  . SINUSITIS, ACUTE 10/02/2009  . Asthma 10/02/2009  . Venous (peripheral) insufficiency 03/11/2008  . Allergic rhinitis 03/11/2008  . BRONCHITIS, RECURRENT 03/11/2008  . History of migraine 03/11/2008  . Anxiety state 11/24/2007  . Hereditary and idiopathic peripheral neuropathy 11/24/2007  . GERD 11/24/2007  . IRRITABLE BOWEL SYNDROME 11/24/2007  . PSORIATIC ARTHROPATHY 11/24/2007    Past Medical History:  Diagnosis Date  . Allergic rhinitis, cause unspecified   . Allergy   . Anemia, unspecified   . Anxiety state, unspecified   . Arthritis   . Cataract    bilaterally removed 2003,2007  . Esophageal reflux   . Irritable bowel syndrome   . Migraine   . Obesity, unspecified   . Psoriatic arthropathy (HCC)   . Unspecified chronic bronchitis (HCC)   . Unspecified hereditary and idiopathic peripheral neuropathy   . Unspecified hypothyroidism   . Unspecified venous (peripheral) insufficiency     Family History  Problem Relation Age of Onset  . Cirrhosis Father   . COPD Mother   . Heart disease Mother   . Hypertension Mother   . Varicose Veins Mother   . Breast cancer Maternal Grandmother   . Breast cancer Other        1st cousin   . Pancreatic cancer Other        1st cousin   . Colon cancer Neg Hx   . Colon polyps Neg Hx   . Esophageal cancer Neg Hx   . Rectal cancer Neg Hx   .  Stomach cancer Neg Hx    Past Surgical History:  Procedure Laterality Date  . ABDOMINAL HYSTERECTOMY    . ABDOMINAL HYSTERECTOMY    . ABLATION ON ENDOMETRIOSIS     x2  . Bilat TKRs  04/2010   by [name]  . BUNIONECTOMY  2009  . CATARACT EXTRACTION, BILATERAL     [id number],2007  . COLONOSCOPY  08-26-2004   with gessner tics and hems   . dequervains tensenovitis Left   . JOINT REPLACEMENT Bilateral     knees  . NASAL SINUS SURGERY    . SHOULDER SURGERY  2005   Social History   Social History Narrative  . Not on file     Objective: Vital Signs: There were no vitals taken for this visit.   Physical Exam   Musculoskeletal Exam: ***  CDAI Exam: No CDAI exam completed.    Investigation: No additional findings.  CBC Latest Ref Rng & Units 01/25/2017 09/08/2016 04/02/2016  WBC 3.8 - 10.8 K/uL 9.2 7.8 8.8  Hemoglobin 11.7 - 15.5 g/dL 54.012.6 98.112.4 19.112.8  Hematocrit 35.0 - 45.0 % 38.8 37.7 38.3  Platelets 140 - 400 K/uL 219 246 249   CMP Latest Ref Rng & Units 01/25/2017 09/08/2016 04/02/2016  Glucose 65 - 99 mg/dL 94 91 91  BUN 7 - 25 mg/dL 15 14 17   Creatinine 0.50 - 0.99 mg/dL 4.78(G1.10(H) 9.56(O1.26(H) 1.30(Q1.04(H)  Sodium 135 - 146 mmol/L 140 142 142  Potassium 3.5 - 5.3 mmol/L 4.4 4.6 4.7  Chloride 98 - 110 mmol/L 108 104 104  CO2 20 - 32 mmol/L 22 23 21   Calcium 8.6 - 10.4 mg/dL 8.9 9.2 9.2  Total Protein 6.1 - 8.1 g/dL 6.5 6.7 7.1  Total Bilirubin 0.2 - 1.2 mg/dL 0.4 0.3 0.4  Alkaline Phos 33 - 130 U/L 87 90 85  AST 10 - 35 U/L 21 22 26   ALT 6 - 29 U/L 25 24 30    Imaging: No results found.  Speciality Comments: No specialty comments available.    Procedures:  No procedures performed Allergies: Nucynta [tapentadol]; Codeine; and Penicillins   Assessment / Plan:     Visit Diagnoses: No diagnosis found.    Orders: No orders of the defined types were placed in this encounter.  No orders of the defined types were placed in this encounter.   Face-to-face time spent with patient was *** minutes. 50% of time was spent in counseling and coordination of care.  Follow-Up Instructions: No Follow-up on file.   Ellen HenriMarissa C Yazir Koerber, CMA  Note - This record has been created using Animal nutritionistDragon software.  Chart creation errors have been sought, but may not always  have been located. Such creation errors do not reflect on  the standard of medical care.

## 2017-05-11 NOTE — Telephone Encounter (Signed)
Last Visit: 11/24/16 Next visit:05/20/17 Labs: 01/25/17 Stable  Patient advised she is due to update labs. Patient states she will update labs this week.   Okay to refill 30 day supply per Dr. Corliss Skainseveshwar

## 2017-05-18 ENCOUNTER — Other Ambulatory Visit: Payer: Self-pay

## 2017-05-18 DIAGNOSIS — Z79899 Other long term (current) drug therapy: Secondary | ICD-10-CM

## 2017-05-18 LAB — COMPLETE METABOLIC PANEL WITH GFR
AG Ratio: 1.6 (calc) (ref 1.0–2.5)
ALBUMIN MSPROF: 4.2 g/dL (ref 3.6–5.1)
ALT: 19 U/L (ref 6–29)
AST: 17 U/L (ref 10–35)
Alkaline phosphatase (APISO): 93 U/L (ref 33–130)
BUN / CREAT RATIO: 10 (calc) (ref 6–22)
BUN: 11 mg/dL (ref 7–25)
CALCIUM: 9.2 mg/dL (ref 8.6–10.4)
CO2: 27 mmol/L (ref 20–32)
CREATININE: 1.05 mg/dL — AB (ref 0.50–0.99)
Chloride: 106 mmol/L (ref 98–110)
GFR, EST AFRICAN AMERICAN: 65 mL/min/{1.73_m2} (ref >=60)
GFR, EST NON AFRICAN AMERICAN: 56 mL/min/{1.73_m2} — AB (ref >=60)
GLOBULIN: 2.7 g/dL (ref 1.9–3.7)
Glucose, Bld: 101 mg/dL — ABNORMAL HIGH (ref 65–99)
Potassium: 4.6 mmol/L (ref 3.5–5.3)
SODIUM: 139 mmol/L (ref 135–146)
TOTAL PROTEIN: 6.9 g/dL (ref 6.1–8.1)
Total Bilirubin: 0.3 mg/dL (ref 0.2–1.2)

## 2017-05-18 LAB — CBC WITH DIFFERENTIAL/PLATELET
Basophils Absolute: 28 cells/uL (ref 0–200)
Basophils Relative: 0.4 %
EOS ABS: 78 {cells}/uL (ref 15–500)
Eosinophils Relative: 1.1 %
HEMATOCRIT: 38.5 % (ref 35.0–45.0)
Hemoglobin: 12.6 g/dL (ref 11.7–15.5)
LYMPHS ABS: 2272 {cells}/uL (ref 850–3900)
MCH: 27.4 pg (ref 27.0–33.0)
MCHC: 32.7 g/dL (ref 32.0–36.0)
MCV: 83.7 fL (ref 80.0–100.0)
MPV: 10.8 fL (ref 7.5–12.5)
Monocytes Relative: 8.4 %
NEUTROS PCT: 58.1 %
Neutro Abs: 4125 cells/uL (ref 1500–7800)
PLATELETS: 238 10*3/uL (ref 140–400)
RBC: 4.6 10*6/uL (ref 3.80–5.10)
RDW: 13.9 % (ref 11.0–15.0)
TOTAL LYMPHOCYTE: 32 %
WBC: 7.1 10*3/uL (ref 3.8–10.8)
WBCMIX: 596 {cells}/uL (ref 200–950)

## 2017-05-19 NOTE — Progress Notes (Signed)
Labs are stable.

## 2017-05-20 ENCOUNTER — Ambulatory Visit: Payer: Commercial Managed Care - HMO | Admitting: Rheumatology

## 2017-05-20 NOTE — Progress Notes (Signed)
Office Visit Note  Patient: Sarah Horton             Date of Birth: Nov 04, 1950           MRN: [id number]             PCP: Sarah Horton Referring: Sarah Horton Visit Date: 05/21/2017 Occupation: @GUAROCC @    Subjective:  Other (right hip/ankle pain )   History of Present Illness: Sarah Horton is a 66 y.o. female with history of psoriatic arthritis and psoriasis. She states that Sarah Horton has been working quite well for her. She has not had any flare of psoriatic arthritis. She developed flulike symptoms is in September 2018. She states today while for her to recover from that. She still had some congestion afterwards. She's been experiencing some pain in her right foot she believes because of that she's been having discomfort in her right hip. Lower back pain is better. Her right shoulder joint is doing better.  Activities of Daily Living:  Patient reports morning stiffness for 30 minutes.   Patient Denies nocturnal pain.  Difficulty dressing/grooming: Denies Difficulty climbing stairs: Reports Difficulty getting out of chair: Denies Difficulty using hands for taps, buttons, cutlery, and/or writing: Denies   Review of Systems  Constitutional: Positive for fatigue. Negative for night sweats, weight gain, weight loss and weakness.  HENT: Negative for mouth sores, trouble swallowing, trouble swallowing, mouth dryness and nose dryness.   Eyes: Negative for pain, redness, visual disturbance and dryness.  Respiratory: Negative for cough, shortness of breath and difficulty breathing.   Cardiovascular: Negative for chest pain, palpitations, hypertension, irregular heartbeat and swelling in legs/feet.  Gastrointestinal: Negative for blood in stool, constipation and diarrhea.  Endocrine: Negative for increased urination.  Genitourinary: Negative for vaginal dryness.  Musculoskeletal: Positive for arthralgias, joint pain and morning stiffness. Negative for joint  swelling, myalgias, muscle weakness, muscle tenderness and myalgias.  Skin: Positive for rash. Negative for color change, hair loss, skin tightness, ulcers and sensitivity to sunlight.  Allergic/Immunologic: Negative for susceptible to infections.  Neurological: Positive for headaches. Negative for dizziness, memory loss and night sweats.  Hematological: Negative for swollen glands.  Psychiatric/Behavioral: Negative for depressed mood and sleep disturbance. The patient is not nervous/anxious.     PMFS History:  Patient Active Problem List   Diagnosis Date Noted  . History of peripheral neuropathy 11/20/2016  . Left wrist tendonitis/ history of surgery for Dequervains  11/20/2016  . Raynaud's disease without gangrene 09/22/2016  . High risk medication use 09/22/2016  . History of ulcer of lower limb/ Venous stasis ulcer 01/2015  09/22/2016  . Osteopenia of multiple sites 09/22/2016  . Psoriasis 09/03/2016  . Chest wall pain 07/16/2016  . Flank pain, acute 05/15/2016  . Subacromial bursitis of right shoulder joint 04/10/2016  . Low back pain 04/09/2016  . Pre-ulcerative calluses 02/11/2016  . Varicose veins of right lower extremity with complications 08/12/2015  . Elevated BP 08/09/2015  . Varicose veins of left lower extremity with complications 07/29/2015  . Varicose veins of bilateral lower extremities with other complications 07/09/2015  . Varicose veins of lower extremities with ulcer (HCC) 04/05/2015  . Status post bilateral knee replacements 12/13/2013  . Venous stasis ulcer of ankle (HCC) 06/06/2013  . ADD (attention deficit disorder) 04/04/2012  . INSOMNIA 06/20/2010  . Hypothyroidism 01/17/2010  . Obesity 01/17/2010  . Osteoarthritis 01/17/2010  . ANEMIA 10/02/2009  . SINUSITIS, ACUTE 10/02/2009  . Asthma 10/02/2009  .  Venous (peripheral) insufficiency 03/11/2008  . Allergic rhinitis 03/11/2008  . BRONCHITIS, RECURRENT 03/11/2008  . History of migraine 03/11/2008  .  Anxiety state 11/24/2007  . Hereditary and idiopathic peripheral neuropathy 11/24/2007  . GERD 11/24/2007  . IRRITABLE BOWEL SYNDROME 11/24/2007  . PSORIATIC ARTHROPATHY 11/24/2007    Past Medical History:  Diagnosis Date  . Allergic rhinitis, cause unspecified   . Allergy   . Anemia, unspecified   . Anxiety state, unspecified   . Arthritis   . Cataract    bilaterally removed 2003,2007  . Esophageal reflux   . Irritable bowel syndrome   . Migraine   . Obesity, unspecified   . Psoriatic arthropathy (HCC)   . Unspecified chronic bronchitis (HCC)   . Unspecified hereditary and idiopathic peripheral neuropathy   . Unspecified hypothyroidism   . Unspecified venous (peripheral) insufficiency     Family History  Problem Relation Age of Onset  . Cirrhosis Father   . COPD Mother   . Heart disease Mother   . Hypertension Mother   . Varicose Veins Mother   . Breast cancer Maternal Grandmother   . Breast cancer Other        1st cousin   . Pancreatic cancer Other        1st cousin   . Thyroid disease Sister   . Colon cancer Neg Hx   . Colon polyps Neg Hx   . Esophageal cancer Neg Hx   . Rectal cancer Neg Hx   . Stomach cancer Neg Hx    Past Surgical History:  Procedure Laterality Date  . ABDOMINAL HYSTERECTOMY    . ABDOMINAL HYSTERECTOMY    . ABLATION ON ENDOMETRIOSIS     x2  . Bilat TKRs  04/2010   by [name]  . BUNIONECTOMY  2009  . CATARACT EXTRACTION, BILATERAL     [id number]  . COLONOSCOPY  08-26-2004   with gessner tics and hems   . dequervains tensenovitis Left   . JOINT REPLACEMENT Bilateral    knees  . NASAL SINUS SURGERY    . SHOULDER SURGERY  2005   Social History   Social History Narrative  . Not on file     Objective: Vital Signs: BP (!) 157/98 (BP Location: Left Arm, Patient Position: Sitting, Cuff Size: Normal)   Pulse 97   Resp 18   Ht 5\' 9"  (1.753 m)   Wt 235 lb (106.6 kg)   BMI 34.70 kg/m    Physical Exam  Constitutional: She is  oriented to person, place, and time. She appears well-developed and well-nourished.  HENT:  Head: Normocephalic and atraumatic.  Eyes: Conjunctivae and EOM are normal.  Neck: Normal range of motion.  Cardiovascular: Normal rate, regular rhythm, normal heart sounds and intact distal pulses.  Pulmonary/Chest: Effort normal and breath sounds normal.  Abdominal: Soft. Bowel sounds are normal.  Lymphadenopathy:    She has no cervical adenopathy.  Neurological: She is alert and oriented to person, place, and time.  Skin: Skin is warm and dry. Capillary refill takes less than 2 seconds.  Psychiatric: She has a normal mood and affect. Her behavior is normal.  Nursing note and vitals reviewed.    Musculoskeletal Exam: C-spine and thoracic lumbar spine good range of motion. She had good range of motion of her shoulders elbows wrists joint MCPs PIPs DIPs without any synovitis. Hip joints are good range of motion. She is tenderness on palpation of her right trochanteric bursa. Knee joints ankles MTPs with good  range of motion with no synovitis.   CDAI Exam: CDAI Homunculus Exam:   Joint Counts:  CDAI Tender Joint count: 0 CDAI Swollen Joint count: 0  Global Assessments:  Patient Global Assessment: 3 Provider Global Assessment: 2  CDAI Calculated Score: 5    Investigation: No additional findings. CBC Latest Ref Rng & Units 05/18/2017 01/25/2017 09/08/2016  WBC 3.8 - 10.8 Thousand/uL 7.1 9.2 7.8  Hemoglobin 11.7 - 15.5 g/dL 86.5 78.4 69.6  Hematocrit 35.0 - 45.0 % 38.5 38.8 37.7  Platelets 140 - 400 Thousand/uL 238 219 246   CMP Latest Ref Rng & Units 05/18/2017 01/25/2017 09/08/2016  Glucose 65 - 99 mg/dL 295(M) 94 91  BUN 7 - 25 mg/dL 11 15 14   Creatinine 0.50 - 0.99 mg/dL 8.41(L) 2.44(W) 1.02(V)  Sodium 135 - 146 mmol/L 139 140 142  Potassium 3.5 - 5.3 mmol/L 4.6 4.4 4.6  Chloride 98 - 110 mmol/L 106 108 104  CO2 20 - 32 mmol/L 27 22 23   Calcium 8.6 - 10.4 mg/dL 9.2 8.9 9.2  Total  Protein 6.1 - 8.1 g/dL 6.9 6.5 6.7  Total Bilirubin 0.2 - 1.2 mg/dL 0.3 0.4 0.3  Alkaline Phos 33 - 130 U/L - 87 90  AST 10 - 35 U/L 17 21 22   ALT 6 - 29 U/L 19 25 24     Imaging: No results found.  Speciality Comments: No specialty comments available.    Procedures:  No procedures performed Allergies: Nucynta [tapentadol]; Codeine; and Penicillins   Assessment / Plan:     Visit Diagnoses: Psoriatic arthropathy (HCC): She has no active synovitis on examination she seems to doing well on methotrexate and otezla combination.  Psoriasis: She has no active lesions of psoriasis.  High risk medication use - Otezla BID, methotrexate 3 tablets by mouth every week, folic acid negative by mouth daily. Her labs have been stable. We will continue to monitor labs every 3 months.  Raynaud's disease without gangrene: Protective clothing was discussed.  Status post bilateral knee replacements: Doing well  History of ulcer of lower limb/ Venous stasis ulcer 01/2015   Left wrist tendonitis/ history of surgery for Dequervains : She is currently asymptomatic.  Osteopenia of multiple sites: She is on calcium and vitamin D.  Primary insomnia: sleep hygiene was discussed.   Chronic midline low back pain without sciatica: Currently she is not very symptomatic.  Subacromial bursitis of right shoulder joint: Patient states that the symptoms flare off and on with traveling and caring bags.  Other medical problems are listed as follows:  History of migraine  History of peripheral neuropathy  History of hypothyroidism  History of hypertension  History of IBS  History of asthma  History of gastroesophageal reflux (GERD)    Orders: No orders of the defined types were placed in this encounter.  No orders of the defined types were placed in this encounter.   Face-to-face time spent with patient was 30 minutes. Greater than 50% of time was spent in counseling and coordination of  care.  Follow-Up Instructions: Return in about 5 months (around 10/19/2017) for Psoriatic arthritis.   Pollyann Savoy, Horton  Note - This record has been created using Animal nutritionist.  Chart creation errors have been sought, but may not always  have been located. Such creation errors do not reflect on  the standard of medical care.

## 2017-05-21 ENCOUNTER — Ambulatory Visit: Payer: Commercial Managed Care - HMO | Admitting: Rheumatology

## 2017-05-21 ENCOUNTER — Encounter: Payer: Self-pay | Admitting: Rheumatology

## 2017-05-21 VITALS — BP 157/98 | HR 97 | Resp 18 | Ht 69.0 in | Wt 235.0 lb

## 2017-05-21 DIAGNOSIS — M8589 Other specified disorders of bone density and structure, multiple sites: Secondary | ICD-10-CM

## 2017-05-21 DIAGNOSIS — F5101 Primary insomnia: Secondary | ICD-10-CM

## 2017-05-21 DIAGNOSIS — M7061 Trochanteric bursitis, right hip: Secondary | ICD-10-CM

## 2017-05-21 DIAGNOSIS — G8929 Other chronic pain: Secondary | ICD-10-CM

## 2017-05-21 DIAGNOSIS — Z8669 Personal history of other diseases of the nervous system and sense organs: Secondary | ICD-10-CM

## 2017-05-21 DIAGNOSIS — L409 Psoriasis, unspecified: Secondary | ICD-10-CM | POA: Diagnosis not present

## 2017-05-21 DIAGNOSIS — L4050 Arthropathic psoriasis, unspecified: Secondary | ICD-10-CM | POA: Diagnosis not present

## 2017-05-21 DIAGNOSIS — M545 Low back pain: Secondary | ICD-10-CM

## 2017-05-21 DIAGNOSIS — Z8639 Personal history of other endocrine, nutritional and metabolic disease: Secondary | ICD-10-CM

## 2017-05-21 DIAGNOSIS — I7300 Raynaud's syndrome without gangrene: Secondary | ICD-10-CM | POA: Diagnosis not present

## 2017-05-21 DIAGNOSIS — Z79899 Other long term (current) drug therapy: Secondary | ICD-10-CM

## 2017-05-21 DIAGNOSIS — Z8719 Personal history of other diseases of the digestive system: Secondary | ICD-10-CM

## 2017-05-21 DIAGNOSIS — M7551 Bursitis of right shoulder: Secondary | ICD-10-CM

## 2017-05-21 DIAGNOSIS — Z8679 Personal history of other diseases of the circulatory system: Secondary | ICD-10-CM

## 2017-05-21 DIAGNOSIS — Z872 Personal history of diseases of the skin and subcutaneous tissue: Secondary | ICD-10-CM | POA: Diagnosis not present

## 2017-05-21 DIAGNOSIS — Z96653 Presence of artificial knee joint, bilateral: Secondary | ICD-10-CM | POA: Diagnosis not present

## 2017-05-21 DIAGNOSIS — Z8709 Personal history of other diseases of the respiratory system: Secondary | ICD-10-CM

## 2017-05-21 NOTE — Patient Instructions (Addendum)
Iliotibial Band Syndrome Rehab Ask your health care provider which exercises are safe for you. Do exercises exactly as told by your health care provider and adjust them as directed. It is normal to feel mild stretching, pulling, tightness, or discomfort as you do these exercises, but you should stop right away if you feel sudden pain or your pain gets worse.Do not begin these exercises until told by your health care provider. Stretching and range of motion exercises These exercises warm up your muscles and joints and improve the movement and flexibility of your hip and pelvis. Exercise A: Quadriceps, prone  1. Lie on your abdomen on a firm surface, such as a bed or padded floor. 2. Bend your left / right knee and hold your ankle. If you cannot reach your ankle or pant leg, loop a belt around your foot and grab the belt instead. 3. Gently pull your heel toward your buttocks. Your knee should not slide out to the side. You should feel a stretch in the front of your thigh and knee. 4. Hold this position for __________ seconds. Repeat __________ times. Complete this stretch __________ times a day. Exercise B: Iliotibial band  1. Lie on your side with your left / right leg in the top position. 2. Bend both of your knees and grab your left / right ankle. Stretch out your bottom arm to help you balance. 3. Slowly bring your top knee back so your thigh goes behind your trunk. 4. Slowly lower your top leg toward the floor until you feel a gentle stretch on the outside of your left / right hip and thigh. If you do not feel a stretch and your knee will not fall farther, place the heel of your other foot on top of your knee and pull your knee down toward the floor with your foot. 5. Hold this position for __________ seconds. Repeat __________ times. Complete this stretch __________ times a day. Strengthening exercises These exercises build strength and endurance in your hip and pelvis. Endurance is the  ability to use your muscles for a long time, even after they get tired. Exercise C: Straight leg raises ( hip abductors) 1. Lie on your side with your left / right leg in the top position. Lie so your head, shoulder, knee, and hip line up. You may bend your bottom knee to help you balance. 2. Roll your hips slightly forward so your hips are stacked directly over each other and your left / right knee is facing forward. 3. Tense the muscles in your outer thigh and lift your top leg 4-6 inches (10-15 cm). 4. Hold this position for __________ seconds. 5. Slowly return to the starting position. Let your muscles relax completely before doing another repetition. Repeat __________ times. Complete this exercise __________ times a day. Exercise D: Straight leg raises ( hip extensors) 1. Lie on your abdomen on your bed or a firm surface. You can put a pillow under your hips if that is more comfortable. 2. Bend your left / right knee so your foot is straight up in the air. 3. Squeeze your buttock muscles and lift your left / right thigh off the bed. Do not let your back arch. 4. Tense this muscle as hard as you can without increasing any knee pain. 5. Hold this position for __________ seconds. 6. Slowly lower your leg to the starting position and allow it to relax completely. Repeat __________ times. Complete this exercise __________ times a day. Exercise E: Hip  hike 1. Stand sideways on a bottom step. Stand on your left / right leg with your other foot unsupported next to the step. You can hold onto the railing or wall if needed for balance. 2. Keep your knees straight and your torso square. Then, lift your left / right hip up toward the ceiling. 3. Slowly let your left / right hip lower toward the floor, past the starting position. Your foot should get closer to the floor. Do not lean or bend your knees. Repeat __________ times. Complete this exercise __________ times a day. This information is not  intended to replace advice given to you by your health care provider. Make sure you discuss any questions you have with your health care provider. Document Released: 05/25/2005 Document Revised: 01/28/2016 Document Reviewed: 04/26/2015 Elsevier Interactive Patient Education  2018 [employer]  [facility] We placed an order today for your standing lab work.    Please come back and get your standing labs in March and every 3 months   We have open lab Monday through Friday from 8:30-11:30 AM and 1:30-4 PM at the office of Dr. Pollyann SavoyShaili Layson Bertsch.   The office is located at [address], [address], [place], [place] [address] No appointment is necessary.   Labs are drawn by [facility].  You may receive a bill from [facility] for your lab work. If you have any questions regarding directions or hours of operation,  please call [phone].

## 2017-05-25 ENCOUNTER — Other Ambulatory Visit: Payer: Self-pay | Admitting: Rheumatology

## 2017-05-26 NOTE — Telephone Encounter (Signed)
Last Visit: 05/21/17 Next Visit: 10/26/17  Okay to refill per Dr. Deveshwar 

## 2017-06-12 ENCOUNTER — Other Ambulatory Visit: Payer: Self-pay | Admitting: Rheumatology

## 2017-06-14 NOTE — Telephone Encounter (Signed)
Last Visit: 05/21/17 Next Visit: 10/26/17 Labs: 05/18/17 Stable  Okay to refill per Dr. Corliss Skainseveshwar

## 2017-07-13 DIAGNOSIS — L718 Other rosacea: Secondary | ICD-10-CM | POA: Diagnosis not present

## 2017-07-13 DIAGNOSIS — L738 Other specified follicular disorders: Secondary | ICD-10-CM | POA: Diagnosis not present

## 2017-07-13 DIAGNOSIS — L7211 Pilar cyst: Secondary | ICD-10-CM | POA: Diagnosis not present

## 2017-07-29 DIAGNOSIS — L7211 Pilar cyst: Secondary | ICD-10-CM | POA: Diagnosis not present

## 2017-08-06 ENCOUNTER — Telehealth: Payer: Self-pay | Admitting: Rheumatology

## 2017-08-06 MED ORDER — APREMILAST 30 MG PO TABS: 30.0000 mg | ORAL_TABLET | Freq: Two times a day (BID) | ORAL | 2 refills | Status: DC

## 2017-08-06 NOTE — Telephone Encounter (Signed)
Raynelle FanningJulie from [facility] left a voicemail  regarding prescription refill of [place] AFBOtezla.  Please fax the prescription to  #[phone] or call in a verbal order to #[phone]

## 2017-08-06 NOTE — Telephone Encounter (Signed)
Last Visit: 05/21/17 Next Visit: 10/26/17  Okay to refill per Dr. Deveshwar 

## 2017-08-14 ENCOUNTER — Other Ambulatory Visit: Payer: Self-pay | Admitting: Rheumatology

## 2017-08-16 NOTE — Telephone Encounter (Addendum)
Last Visit: 05/21/17 Next Visit: 10/26/17 Labs: 05/18/17 Stable  Patient reminded she is due for labs and will update 08/19/17  Okay to refill per Dr. Corliss Skainseveshwar

## 2017-08-17 ENCOUNTER — Other Ambulatory Visit: Payer: Self-pay | Admitting: Rheumatology

## 2017-08-17 DIAGNOSIS — H02831 Dermatochalasis of right upper eyelid: Secondary | ICD-10-CM | POA: Diagnosis not present

## 2017-08-17 DIAGNOSIS — H02834 Dermatochalasis of left upper eyelid: Secondary | ICD-10-CM | POA: Diagnosis not present

## 2017-08-17 DIAGNOSIS — H52203 Unspecified astigmatism, bilateral: Secondary | ICD-10-CM | POA: Diagnosis not present

## 2017-08-17 DIAGNOSIS — H524 Presbyopia: Secondary | ICD-10-CM | POA: Diagnosis not present

## 2017-08-17 NOTE — Telephone Encounter (Signed)
Last Visit: 05/21/17 Next Visit: 10/26/17  Okay to refill per Dr. Deveshwar 

## 2017-08-30 ENCOUNTER — Telehealth: Payer: Self-pay

## 2017-08-30 NOTE — Telephone Encounter (Signed)
Received a prior authorization request for [place] from [employer]. Authorization has been completed and submitted with last office visit notes. Will update once we have a response.   Mardene Lessig, [place]asta, CPhT 11:25 AM

## 2017-09-02 ENCOUNTER — Other Ambulatory Visit: Payer: Self-pay

## 2017-09-02 DIAGNOSIS — Z79899 Other long term (current) drug therapy: Secondary | ICD-10-CM | POA: Diagnosis not present

## 2017-09-02 LAB — COMPLETE METABOLIC PANEL WITH GFR
AG Ratio: 1.8 (calc) (ref 1.0–2.5)
ALKALINE PHOSPHATASE (APISO): 76 U/L (ref 33–130)
ALT: 15 U/L (ref 6–29)
AST: 16 U/L (ref 10–35)
Albumin: 4.1 g/dL (ref 3.6–5.1)
BILIRUBIN TOTAL: 0.5 mg/dL (ref 0.2–1.2)
BUN/Creatinine Ratio: 16 (calc) (ref 6–22)
BUN: 17 mg/dL (ref 7–25)
CHLORIDE: 108 mmol/L (ref 98–110)
CO2: 28 mmol/L (ref 20–32)
Calcium: 9 mg/dL (ref 8.6–10.4)
Creat: 1.06 mg/dL — ABNORMAL HIGH (ref 0.50–0.99)
GFR, EST AFRICAN AMERICAN: 64 mL/min/{1.73_m2} (ref >=60)
GFR, Est Non African American: 55 mL/min/{1.73_m2} — ABNORMAL LOW (ref >=60)
GLUCOSE: 87 mg/dL (ref 65–99)
Globulin: 2.3 g/dL (calc) (ref 1.9–3.7)
Potassium: 4.6 mmol/L (ref 3.5–5.3)
Sodium: 143 mmol/L (ref 135–146)
TOTAL PROTEIN: 6.4 g/dL (ref 6.1–8.1)

## 2017-09-02 LAB — CBC WITH DIFFERENTIAL/PLATELET
BASOS ABS: 53 {cells}/uL (ref 0–200)
Basophils Relative: 0.6 %
EOS ABS: 132 {cells}/uL (ref 15–500)
Eosinophils Relative: 1.5 %
HCT: 36.8 % (ref 35.0–45.0)
Hemoglobin: 12.2 g/dL (ref 11.7–15.5)
Lymphs Abs: 2561 cells/uL (ref 850–3900)
MCH: 27.1 pg (ref 27.0–33.0)
MCHC: 33.2 g/dL (ref 32.0–36.0)
MCV: 81.6 fL (ref 80.0–100.0)
MONOS PCT: 6.8 %
MPV: 11.3 fL (ref 7.5–12.5)
Neutro Abs: 5456 cells/uL (ref 1500–7800)
Neutrophils Relative %: 62 %
PLATELETS: 229 10*3/uL (ref 140–400)
RBC: 4.51 10*6/uL (ref 3.80–5.10)
RDW: 14.9 % (ref 11.0–15.0)
TOTAL LYMPHOCYTE: 29.1 %
WBC mixed population: 598 cells/uL (ref 200–950)
WBC: 8.8 10*3/uL (ref 3.8–10.8)

## 2017-09-07 NOTE — Telephone Encounter (Signed)
[facility] a fax from [facility] regarding a prior authorization approval for OTEZLA from 08/30/2017 to 06/07/2098.   Reference [id number]:[id number] Phone number:[phone]  Will send document to scan center.  Called pt to update. Pt voices understanding and denies any questions at this time.  Allien Melberg, [place], CPhT 10:32 AM

## 2017-09-28 ENCOUNTER — Other Ambulatory Visit: Payer: Self-pay | Admitting: Rheumatology

## 2017-09-28 NOTE — Telephone Encounter (Signed)
Last Visit: 05/21/17 Next Visit: 10/26/17 Labs: 09/02/17 Stable  Okay to refill per Dr. Corliss Skainseveshwar

## 2017-10-14 NOTE — Progress Notes (Signed)
Office Visit Note  Patient: Sarah Horton             Date of Birth: 1951/05/08           MRN: [id number]             PCP: Sarah Mcalpine, MD Referring: Sarah Mcalpine, MD Visit Date: 10/26/2017 Occupation: @    Subjective:  Arthritis (Doing fair, Otexla not working well, reduced MTX (3 weekly now), achilles tendon right swollen x 4 months, right hand swelling)   History of Present Illness: Sarah Horton is a 67 y.o. female aortic arthritis and psoriasis.  She states she has been having increased pain and discomfort recently.  She believes that Sarah Horton is not working for her.  She is having recurrent problems with right Achilles tendinitis.  She is been experiencing pain and swelling in her hands.  Her left SI joint is also painful.  Her DEXA dose was reduced to 3 tablets/week due to elevation in creatinine.  Activities of Daily Living:  Patient reports morning stiffness for 2 hours.   Patient Denies nocturnal pain.  Difficulty dressing/grooming: Denies Difficulty climbing stairs: Reports Difficulty getting out of chair: Denies Difficulty using hands for taps, buttons, cutlery, and/or writing: Reports   Review of Systems  Constitutional: Positive for fatigue. Negative for activity change, night sweats, weight gain and weight loss.  HENT: Positive for mouth dryness. Negative for mouth sores, trouble swallowing, trouble swallowing and nose dryness.   Eyes: Negative for pain, redness, visual disturbance and dryness.  Respiratory: Negative for cough, shortness of breath and difficulty breathing.   Cardiovascular: Negative for chest pain, palpitations, hypertension, irregular heartbeat and swelling in legs/feet.  Gastrointestinal: Negative for blood in stool, constipation and diarrhea.  Endocrine: Negative for excessive thirst and increased urination.  Genitourinary: Negative for difficulty urinating and vaginal dryness.  Musculoskeletal: Positive for  arthralgias, joint pain, joint swelling, morning stiffness and muscle tenderness. Negative for myalgias, muscle weakness and myalgias.  Skin: Positive for rash. Negative for color change, hair loss, skin tightness, ulcers and sensitivity to sunlight.  Allergic/Immunologic: Negative for susceptible to infections.  Neurological: Negative for dizziness, numbness, headaches, memory loss, night sweats and weakness.  Hematological: Negative for bruising/bleeding tendency and swollen glands.  Psychiatric/Behavioral: Negative for depressed mood and sleep disturbance. The patient is not nervous/anxious.     PMFS History:  Patient Active Problem List   Diagnosis Date Noted  . History of peripheral neuropathy 11/20/2016  . Left wrist tendonitis/ history of surgery for Dequervains  11/20/2016  . Raynaud's disease without gangrene 09/22/2016  . High risk medication use 09/22/2016  . History of ulcer of lower limb/ Venous stasis ulcer 01/2015  09/22/2016  . Osteopenia of multiple sites 09/22/2016  . Psoriasis 09/03/2016  . Chest wall pain 07/16/2016  . Flank pain, acute 05/15/2016  . Subacromial bursitis of right shoulder joint 04/10/2016  . Low back pain 04/09/2016  . Pre-ulcerative calluses 02/11/2016  . Varicose veins of right lower extremity with complications 08/12/2015  . Elevated BP 08/09/2015  . Varicose veins of left lower extremity with complications 07/29/2015  . Varicose veins of bilateral lower extremities with other complications 07/09/2015  . Varicose veins of lower extremities with ulcer (HCC) 04/05/2015  . Status post bilateral knee replacements 12/13/2013  . Venous stasis ulcer of ankle (HCC) 06/06/2013  . ADD (attention deficit disorder) 04/04/2012  . INSOMNIA 06/20/2010  . Hypothyroidism 01/17/2010  . Obesity 01/17/2010  . Osteoarthritis 01/17/2010  .  ANEMIA 10/02/2009  . SINUSITIS, ACUTE 10/02/2009  . Asthma 10/02/2009  . Venous (peripheral) insufficiency 03/11/2008  .  Allergic rhinitis 03/11/2008  . BRONCHITIS, RECURRENT 03/11/2008  . History of migraine 03/11/2008  . Anxiety state 11/24/2007  . Hereditary and idiopathic peripheral neuropathy 11/24/2007  . GERD 11/24/2007  . IRRITABLE BOWEL SYNDROME 11/24/2007  . PSORIATIC ARTHROPATHY 11/24/2007    Past Medical History:  Diagnosis Date  . Allergic rhinitis, cause unspecified   . Allergy   . Anemia, unspecified   . Anxiety state, unspecified   . Arthritis   . Cataract    bilaterally removed 2003,2007  . Esophageal reflux   . Irritable bowel syndrome   . Migraine   . Obesity, unspecified   . Psoriatic arthropathy (HCC)   . Unspecified chronic bronchitis (HCC)   . Unspecified hereditary and idiopathic peripheral neuropathy   . Unspecified hypothyroidism   . Unspecified venous (peripheral) insufficiency     Family History  Problem Relation Age of Onset  . Cirrhosis Father   . COPD Mother   . Heart disease Mother   . Hypertension Mother   . Varicose Veins Mother   . Breast cancer Maternal Grandmother   . Breast cancer Other        1st cousin   . Pancreatic cancer Other        1st cousin   . Thyroid disease Sister   . Colon cancer Neg Hx   . Colon polyps Neg Hx   . Esophageal cancer Neg Hx   . Rectal cancer Neg Hx   . Stomach cancer Neg Hx    Past Surgical History:  Procedure Laterality Date  . ABDOMINAL HYSTERECTOMY    . ABDOMINAL HYSTERECTOMY    . ABLATION ON ENDOMETRIOSIS     x2  . Bilat TKRs  04/2010   by [name]  . BUNIONECTOMY  2009  . CATARACT EXTRACTION, BILATERAL     [id number],[id number]  . COLONOSCOPY  08-26-2004   with gessner tics and hems   . dequervains tensenovitis Left   . JOINT REPLACEMENT Bilateral    knees  . NASAL SINUS SURGERY    . SHOULDER SURGERY  2005  . TOTAL SHOULDER ARTHROPLASTY     Social History   Social History Narrative  . Not on file     Objective: Vital Signs: BP 133/76 (BP Location: Left Arm, Patient Position: Sitting, Cuff Size: Normal)    Pulse (!) 107   Resp 18   Ht  (1.727 m)   Wt 240 lb (108.9 kg)   BMI 36.49 kg/m    Physical Exam  Constitutional: She is oriented to person, place, and time. She appears well-developed and well-nourished.  HENT:  Head: Normocephalic and atraumatic.  Eyes: Conjunctivae and EOM are normal.  Neck: Normal range of motion.  Cardiovascular: Normal rate, regular rhythm, normal heart sounds and intact distal pulses.  Pulmonary/Chest: Effort normal and breath sounds normal.  Abdominal: Soft. Bowel sounds are normal.  Lymphadenopathy:    She has no cervical adenopathy.  Neurological: She is alert and oriented to person, place, and time.  Skin: Skin is warm and dry. Capillary refill takes less than 2 seconds. No rash noted.  Psychiatric: She has a normal mood and affect. Her behavior is normal.  Nursing note and vitals reviewed.    Musculoskeletal Exam: C-spine thoracic lumbar spine good range of motion.  She has tenderness on palpation over left SI joint.  Shoulder joints elbow joints wrist joints with good  range of motion.  Joints as described below.  All the joints full range of motion.  CDAI Exam: CDAI Homunculus Exam:   Tenderness:  RUE: wrist Right hand: 1st MCP and 5th PIP RLE: tibiotalar  Swelling:  Right hand: 1st MCP and 5th PIP RLE: tibiotalar  Joint Counts:  CDAI Tender Joint count: 3 CDAI Swollen Joint count: 2  Global Assessments:  Patient Global Assessment: 7 Provider Global Assessment: 7  CDAI Calculated Score: 19    Investigation: No additional findings. CBC Latest Ref Rng & Units 09/02/2017 05/18/2017 01/25/2017  WBC 3.8 - 10.8 Thousand/uL 8.8 7.1 9.2  Hemoglobin 11.7 - 15.5 g/dL 16.1 09.6 04.5  Hematocrit 35.0 - 45.0 % 36.8 38.5 38.8  Platelets 140 - 400 Thousand/uL 229 238 219   CMP Latest Ref Rng & Units 09/02/2017 05/18/2017 01/25/2017  Glucose 65 - 99 mg/dL 87 409(W) 94  BUN 7 - 25 mg/dL Creatinine 0.50 - 0.99 mg/dL 1.19(J) 4.78(G)  9.56(O)  Sodium 135 - 146 mmol/L 143 139 140  Potassium 3.5 - 5.3 mmol/L 4.6 4.6 4.4  Chloride 98 - 110 mmol/L 108 106 108  CO2 20 - 32 mmol/L Calcium 8.6 - 10.4 mg/dL 9.0 9.2 8.9  Total Protein 6.1 - 8.1 g/dL 6.4 6.9 6.5  Total Bilirubin 0.2 - 1.2 mg/dL 0.5 0.3 0.4  Alkaline Phos 33 - 130 U/L - - 87  AST 10 - 35 U/L ALT 6 - 29 U/L Imaging: No results found.  Speciality Comments: No specialty comments available.    Procedures:  No procedures performed Allergies: Nucynta [tapentadol]; Codeine; and Penicillins   Assessment / Plan:     Visit Diagnoses: Psoriatic arthropathy (HCC)-having a flare with increased pain and swelling in multiple joints.  She has synovitis in her joints as described above.  She is also having sacroiliitis and right Achilles tendinitis.  It is interfering with her routine activities.  We had detailed discussion regarding different treatment options.  She had an adequate response to Enbrel in the past.  She is taking low-dose methotrexate with Sarah Horton which is not effective.  After reviewing different treatment options and their side effects she wanted to proceed with Cosentyx.  A handout was given and consent was taken.  We will apply for Cosentyx.  When she starts Cosentyx we will get labs in 1 month and then every 3 months to monitor for drug toxicity.  She has been advised to get pneumococcal and Shingrix vaccine if it is not up-to-date.  She will continue methotrexate for right now.  Her creatinine is elevated.  If she does well on Cosentyx we may be able to discontinue methotrexate. She will discontinue Otezla at least a week prior to Cosentyx injection. Medication counseling: TB Gold: Pending Hepatitis panel: September 05, 2015- HIV: Pending SPEP: Pending  immunoglobulin: Pending  Does patient have a history of inflammatory bowel disease? No  Counseled patient that Cosentyx is a IL-17 inhibitor that works to reduce pain and  inflammation associated with arthritis.  Counseled patient on purpose, proper use, and adverse effects of Cosentyx. Reviewed the most common adverse effects of infection, inflammatory bowel disease, and allergic reaction.  Reviewed the importance of regular labs while on Cosentyx.  Counseled patient that Cosentyx should be held prior to scheduled surgery.  Counseled patient to avoid live vaccines while on Cosentyx.  Advised patient to get annual influenza vaccine and the  pneumococcal vaccine as indicated.  Provided patient with medication education material and answered all questions.  Patient consented to Cosentyx.  Will upload consent into patient's chart.  Will apply for Cosentyx through patient's insurance.  Reviewed storage information for Cosentyx.  Advised initial injection must be administered in office.  Patient voiced understanding.     Psoriasis-she does not have any active psoriasis lesions currently.  High risk medication use -  MTX 3 tabs po q wk, folic acid  po qd, ( Inadequate response to Enbrel and Otezla)  Achilles tendinitis, right leg-she had tenderness and swelling over right Achilles tendon.  Sacroiliitis (HCC)-she had tenderness over left SI joint.  Status post bilateral knee replacements- Doing well. She has some stiffness .  Raynaud's disease without gangrene- more bothersome in her feet during the winter months.  Chronic midline low back pain without sciatica- she has chronic pain.  History of ulcer of lower limb/ Venous stasis ulcer 01/2015   Other medical problems are listed below:  Left wrist tendonitis/ history of surgery for Dequervains   Osteopenia of multiple sites  Primary insomnia  History of gastroesophageal reflux (GERD)  History of migraine  History of asthma  History of peripheral neuropathy  History of IBS  History of hypothyroidism  History of hypertension    Orders: Orders Placed This Encounter  Procedures  . HIV antibody  .  QuantiFERON-TB Gold Plus  . Serum protein electrophoresis with reflex  . IgG, IgA, IgM   No orders of the defined types were placed in this encounter.   Face-to-face time spent with patient was 40 minutes. >50% of time was spent in counseling and coordination of care.  Follow-Up Instructions: Return in about 3 months (around 01/26/2018) for Psoriatic arthritis.   Pollyann Savoy, MD  Note - This record has been created using Animal nutritionist.  Chart creation errors have been sought, but may not always  have been located. Such creation errors do not reflect on  the standard of medical care.

## 2017-10-20 ENCOUNTER — Other Ambulatory Visit: Payer: Self-pay | Admitting: Rheumatology

## 2017-10-20 NOTE — Telephone Encounter (Signed)
Last Visit: 05/21/17 Next Visit: 10/26/17 Labs: 09/02/17 Stable  Okay to refill per Dr. Corliss Skains

## 2017-10-26 ENCOUNTER — Encounter: Payer: Self-pay | Admitting: Rheumatology

## 2017-10-26 ENCOUNTER — Ambulatory Visit: Payer: BLUE CROSS/BLUE SHIELD | Admitting: Rheumatology

## 2017-10-26 VITALS — BP 133/76 | HR 107 | Resp 18 | Ht 68.0 in | Wt 240.0 lb

## 2017-10-26 DIAGNOSIS — G8929 Other chronic pain: Secondary | ICD-10-CM

## 2017-10-26 DIAGNOSIS — Z8669 Personal history of other diseases of the nervous system and sense organs: Secondary | ICD-10-CM

## 2017-10-26 DIAGNOSIS — L4050 Arthropathic psoriasis, unspecified: Secondary | ICD-10-CM

## 2017-10-26 DIAGNOSIS — Z79899 Other long term (current) drug therapy: Secondary | ICD-10-CM | POA: Diagnosis not present

## 2017-10-26 DIAGNOSIS — M7661 Achilles tendinitis, right leg: Secondary | ICD-10-CM | POA: Diagnosis not present

## 2017-10-26 DIAGNOSIS — Z96653 Presence of artificial knee joint, bilateral: Secondary | ICD-10-CM

## 2017-10-26 DIAGNOSIS — M545 Low back pain, unspecified: Secondary | ICD-10-CM

## 2017-10-26 DIAGNOSIS — Z8719 Personal history of other diseases of the digestive system: Secondary | ICD-10-CM

## 2017-10-26 DIAGNOSIS — I7300 Raynaud's syndrome without gangrene: Secondary | ICD-10-CM

## 2017-10-26 DIAGNOSIS — M461 Sacroiliitis, not elsewhere classified: Secondary | ICD-10-CM

## 2017-10-26 DIAGNOSIS — Z8639 Personal history of other endocrine, nutritional and metabolic disease: Secondary | ICD-10-CM

## 2017-10-26 DIAGNOSIS — Z8679 Personal history of other diseases of the circulatory system: Secondary | ICD-10-CM

## 2017-10-26 DIAGNOSIS — Z872 Personal history of diseases of the skin and subcutaneous tissue: Secondary | ICD-10-CM

## 2017-10-26 DIAGNOSIS — M8589 Other specified disorders of bone density and structure, multiple sites: Secondary | ICD-10-CM

## 2017-10-26 DIAGNOSIS — Z8709 Personal history of other diseases of the respiratory system: Secondary | ICD-10-CM

## 2017-10-26 DIAGNOSIS — L409 Psoriasis, unspecified: Secondary | ICD-10-CM | POA: Diagnosis not present

## 2017-10-26 DIAGNOSIS — F5101 Primary insomnia: Secondary | ICD-10-CM

## 2017-10-26 DIAGNOSIS — M778 Other enthesopathies, not elsewhere classified: Secondary | ICD-10-CM

## 2017-10-26 NOTE — Patient Instructions (Signed)
Standing Labs We placed an order today for your standing lab work.    Please come back and get your standing labs in 1 month then every 3 months  We have open lab Monday through Friday from 8:30-11:30 AM and 1:30-4:00 PM  at the office of Dr. Pollyann Savoy.   You may experience shorter wait times on Monday and Friday afternoons. The office is located at [address], [place], [place] [address] No appointment is necessary.   Labs are drawn by [facility].  You may receive a bill from [facility] for your lab work. If you have any questions regarding directions or hours of operation,  please call [phone].    Advise Pneumococcal and Shingrix vaccine   Secukinumab injection What is this medicine? SECUKINUMAB (sek ue KIN ue mab) is used to treat psoriasis. It is also used to treat psoriatic arthritis and ankylosing spondylitis. This medicine may be used for other purposes; ask your health care provider or pharmacist if you have questions. COMMON BRAND NAME(S): Cosentyx What should I tell my health care provider before I take this medicine? They need to know if you have any of these conditions: -Crohn's disease, ulcerative colitis, or other inflammatory bowel disease -infection or history of infection -other conditions affecting the immune system -recently received or are scheduled to receive a vaccine -tuberculosis, a positive skin test for tuberculosis, or have recently been in close contact with someone who has tuberculosis -an unusual or allergic reaction to secukinumab, other medicines, latex, rubber, foods, dyes, or preservatives -pregnant or trying to get pregnant -breast-feeding How should I use this medicine? This medicine is for injection under the skin. It may be administered by a healthcare professional in a hospital or clinic setting or at home. If you get this medicine at home, you will be taught how to prepare and give this medicine. Use exactly as directed. Take  your medicine at regular intervals. Do not take your medicine more often than directed. It is important that you put your used needles and syringes in a special sharps container. Do not put them in a trash can. If you do not have a sharps container, call your pharmacist or healthcare provider to get one. A special MedGuide will be given to you by the pharmacist with each prescription and refill. Be sure to read this information carefully each time. Talk to your pediatrician regarding the use of this medicine in children. Special care may be needed. Overdosage: If you think you have taken too much of this medicine contact a poison control center or emergency room at once. NOTE: This medicine is only for you. Do not share this medicine with others. What if I miss a dose? It is important not to miss your dose. Call your doctor of health care professional if you are unable to keep an appointment. If you give yourself the medicine and you miss a dose, take it as soon as you can. If it is almost time for your next dose, take only that dose. Do not take double or extra doses. What may interact with this medicine? Do not take this medicine with any of the following medications: -live virus vaccines This medicine may also interact with the following medications: -cyclosporine -inactivated vaccines -warfarin This list may not describe all possible interactions. Give your health care provider a list of all the medicines, herbs, non-prescription drugs, or dietary supplements you use. Also tell them if you smoke, drink alcohol, or use illegal drugs. Some items  may interact with your medicine. What should I watch for while using this medicine? Tell your doctor or healthcare professional if your symptoms do not start to get better or if they get worse. You will be tested for tuberculosis (TB) before you start this medicine. If your doctor prescribes any medicine for TB, you should start taking the TB medicine  before starting this medicine. Make sure to finish the full course of TB medicine. Call your doctor or healthcare professional for advice if you get a fever, chills or sore throat, or other symptoms of a cold or flu. Do not treat yourself. This drug decreases your body's ability to fight infections. Try to avoid being around people who are sick. This medicine can decrease the response to a vaccine. If you need to get vaccinated, tell your healthcare professional if you have received this medicine within the last 6 months. Extra booster doses may be needed. Talk to your doctor to see if a different vaccination schedule is needed. What side effects may I notice from receiving this medicine? Side effects that you should report to your doctor or health care professional as soon as possible: -allergic reactions like skin rash, itching or hives, swelling of the face, lips, or tongue -signs and symptoms of infection like fever or chills; cough; sore throat; pain or trouble passing urine Side effects that usually do not require medical attention (report to your doctor or health care professional if they continue or are bothersome): -diarrhea This list may not describe all possible side effects. Call your doctor for medical advice about side effects. You may report side effects to FDA at 1-800-FDA-1088. Where should I keep my medicine? Keep out of the reach of children. Store the prefilled syringe or injection pen in a refrigerator between 2 to 8 degrees C (36 to 46 degrees F). Keep the syringe or the pen in the original carton until ready for use. Protect from light. Do not freeze. Do not shake. Prior to use, remove the syringe or pen from the refrigerator and use within 1 hour. Throw away any unused medicine after the expiration date on the label. NOTE: This sheet is a summary. It may not cover all possible information. If you have questions about this medicine, talk to your doctor, pharmacist, or health care  provider.  2018 Elsevier/Gold Standard (2015-06-27 11:48:31)

## 2017-10-27 ENCOUNTER — Telehealth: Payer: Self-pay

## 2017-10-27 NOTE — Telephone Encounter (Signed)
What loading dose will the pt be starting on?  or s at week 0,1,2,3 and 4 followed by every 4 weeks?  Thanks  Adiah Guereca, [place], CPhT 10:09 AM

## 2017-10-27 NOTE — Telephone Encounter (Signed)
Cosyntex 300 mg

## 2017-10-27 NOTE — Telephone Encounter (Signed)
-----   Message from Ellen Henri, CMA sent at 10/26/2017  4:33 PM EDT ----- Regarding: cosentyx Please apply for cosentyx, per Dr. Corliss Skains. Can you also apply for the cover until your covered program? Thanks!

## 2017-10-28 LAB — IGG, IGA, IGM
IgG (Immunoglobin G), Serum: 834 mg/dL (ref 694–1618)
IgM, Serum: 71 mg/dL (ref 48–271)
Immunoglobulin A: 294 mg/dL (ref 81–463)

## 2017-10-28 LAB — PROTEIN ELECTROPHORESIS, SERUM, WITH REFLEX
Albumin ELP: 3.8 g/dL (ref 3.8–4.8)
Alpha 1: 0.3 g/dL (ref 0.2–0.3)
Alpha 2: 0.7 g/dL (ref 0.5–0.9)
Beta 2: 0.4 g/dL (ref 0.2–0.5)
Beta Globulin: 0.5 g/dL (ref 0.4–0.6)
Gamma Globulin: 0.8 g/dL (ref 0.8–1.7)
Total Protein: 6.5 g/dL (ref 6.1–8.1)

## 2017-10-28 LAB — QUANTIFERON-TB GOLD PLUS
Mitogen-NIL: 4.5 IU/mL
NIL: 0.01 IU/mL
QuantiFERON-TB Gold Plus: NEGATIVE
TB1-NIL: 0.01 IU/mL
TB2-NIL: 0 IU/mL

## 2017-10-28 LAB — HIV ANTIBODY (ROUTINE TESTING W REFLEX): HIV 1&2 Ab, 4th Generation: NONREACTIVE

## 2017-10-28 NOTE — Progress Notes (Signed)
WNL

## 2017-10-28 NOTE — Telephone Encounter (Signed)
Authorization has been submitted to pts insurance via cover my meds. Will update once we have a response.   Nakiyah Beverley, [place], CPhT 9:16 AM

## 2017-10-29 NOTE — Progress Notes (Signed)
Left message labs were normal

## 2017-11-02 NOTE — Telephone Encounter (Signed)
Received a fax from [facility], [place] regarding a prior authorization approval for COSENTYX PENS from 10/29/2017 to 06/07/2098.   Reference number: [id number] Phone number:[phone]  Will send document to scan center.  Called pt to update. We will send her Rx to her specialty pharmacy. The first dose will come to the clinic for administration. There is a co-pay card that she can use to help with co-pay. Pt did not answer, had to leave a message.   Arsenia Goracke, [place], CPhT 9:32 AM

## 2017-11-03 ENCOUNTER — Telehealth: Payer: Self-pay | Admitting: Rheumatology

## 2017-11-03 NOTE — Telephone Encounter (Signed)
Per patient [facility] does not have rx for Cosentyx. Please call patient to discuss. Per patient insurance has approved medication.

## 2017-11-04 MED ORDER — SECUKINUMAB 150 MG/ML ~~LOC~~ SOAJ: 300.0000 mg | SUBCUTANEOUS | 0 refills | Status: DC

## 2017-11-04 NOTE — Telephone Encounter (Signed)
Prescription has been sent to the pharmacy and will schedule nurse visit once medication has been received in the office.

## 2017-11-04 NOTE — Telephone Encounter (Signed)
Prescription has been sent to the pharmacy and will schedule nurse visit once medication has been received in the office.  

## 2017-11-04 NOTE — Addendum Note (Signed)
Addended by: Henriette Combs on: 11/04/2017 09:00 AM   Modules accepted: Orders

## 2017-11-08 NOTE — Telephone Encounter (Signed)
[facility] [phone]) to check the status of pts rx fro Cosentyx. Spoke with Victorino DikeJennifer who states that they don't have an Rx for Consentyx on file. Please call in verbal. Thanks!  Sheli Dorin 12:32 PM

## 2017-11-09 NOTE — Telephone Encounter (Signed)
Spoke with Chelesa at [facility] and verbal prescription has been provided. They will get prescription ready and send to office.

## 2017-11-09 NOTE — Telephone Encounter (Signed)
Patient called inquiring about her shipment of Cosentyx. She is supposed to start in the clinic. [facility] but they told her that did not have an Rx on file. Please call [facility] at [phone] to give verbal Rx. Please discard the previous phone number. Thanks!  She would like to get started soon. She will be going out of town on 6/10 or 6/12 to stay with some family for a couple of weeks. Please advise.   Chaia Ikard, [place], CPhT 10:10 AM

## 2017-11-09 NOTE — Telephone Encounter (Signed)
Pt called stating that she just got off the phone with her pharmacy. They will deliver the meds to pts home on Friday. She is wanting to start her Cosentyx sometime this week because she is leaving town on Monday for several weeks. First dose is suppose to be shipped to the clinic. We usually don't schedule a nursing visit until we have received the medication.   Called pharmacy to check the status of Rx. Spoke with representative who states that the meds is scheduled for delivery on Friday, June 7th to pts home. They will reach out to pt to consent shipment to office. Once complete, they will reach back out to clinic to schedule delivery.   Called pt back to update. She is out of otezla but would like an Rx for MTX called into [facility] to get her through until next week. She will be out of town for two weeks. She isn't sure if she should get a refill on Otezla or wait to start Cosentyx when she returns. Please advise. Thanks!  Vergia Chea, [place], CPhT 3:09 PM

## 2017-11-11 ENCOUNTER — Telehealth: Payer: Self-pay | Admitting: Rheumatology

## 2017-11-11 NOTE — Telephone Encounter (Signed)
Patient left a voicemail stating that she received her Cosentyx injection and would like to schedule a nurse visit for Tues 6/11.

## 2017-11-11 NOTE — Telephone Encounter (Signed)
See previous phone note.  

## 2017-11-11 NOTE — Telephone Encounter (Signed)
Patient advised once medication received in the office on 11/12/17 we will call to schedule appointment. Patient will call pharmacy regarding the MTX as a 90 day supply was sent to the pharmacy on 10-20-17 and she only received a 30 day supply.

## 2017-11-11 NOTE — Telephone Encounter (Signed)
Please call patient to check if she has received Cosentyx.  In that case she can receive her Cosentyx sample injection in the office.  Please call and methotrexate prescription if due.

## 2017-11-12 ENCOUNTER — Telehealth: Payer: Self-pay | Admitting: *Deleted

## 2017-11-12 NOTE — Telephone Encounter (Signed)
Received patient's Cosetnyx and it was placed in the fridge. Contacted patient and she has been scheduled for a nurse visit on 11/16/17 at 9 am.

## 2017-11-16 ENCOUNTER — Ambulatory Visit: Payer: BLUE CROSS/BLUE SHIELD | Admitting: *Deleted

## 2017-11-16 VITALS — BP 95/75 | HR 85

## 2017-11-16 DIAGNOSIS — L4050 Arthropathic psoriasis, unspecified: Secondary | ICD-10-CM

## 2017-11-16 MED ORDER — SECUKINUMAB 150 MG/ML ~~LOC~~ SOAJ
300.0000 mg | Freq: Once | SUBCUTANEOUS | Status: AC
  Administered 2017-11-16: 300 mg via SUBCUTANEOUS

## 2017-11-16 NOTE — Patient Instructions (Signed)
Standing Labs We placed an order today for your standing lab work.    Please come back and get your standing labs in 1 month and every 3 months  We have open lab Monday through Friday from 8:30-11:30 AM and 1:30-4:00 PM  at the office of Dr. Shaili Deveshwar.   You may experience shorter wait times on Monday and Friday afternoons. The office is located at [address], [address], [place], [place] [address] No appointment is necessary.   Labs are drawn by [facility].  You may receive a bill from [facility] for your lab work. If you have any questions regarding directions or hours of operation,  please call [phone].     

## 2017-11-16 NOTE — Progress Notes (Signed)
Patient in office for new start to Cosentyx. Patient was given demonstration to self administer Cosentyx. Patient was able to show the proper technique to self administer medication. Patient was given injection in right and left thigh and tolerated well. Patient was monitored in office for 30 minutes after injections for adverse reactions. No adverse reactions noted.   Administrations This Visit    Secukinumab SOAJ 300 mg    Admin Date 11/16/2017 Action Given Dose 300 mg Route Subcutaneous Administered By Henriette CombsHatton, Lanesha Azzaro L, LPN

## 2017-12-20 ENCOUNTER — Other Ambulatory Visit: Payer: Self-pay | Admitting: *Deleted

## 2017-12-20 ENCOUNTER — Telehealth: Payer: Self-pay | Admitting: Pulmonary Disease

## 2017-12-20 ENCOUNTER — Other Ambulatory Visit: Payer: Self-pay

## 2017-12-20 DIAGNOSIS — Z79899 Other long term (current) drug therapy: Secondary | ICD-10-CM | POA: Diagnosis not present

## 2017-12-20 LAB — CBC WITH DIFFERENTIAL/PLATELET
BASOS PCT: 0.5 %
Basophils Absolute: 31 cells/uL (ref 0–200)
EOS ABS: 68 {cells}/uL (ref 15–500)
Eosinophils Relative: 1.1 %
HCT: 37.8 % (ref 35.0–45.0)
HEMOGLOBIN: 12.5 g/dL (ref 11.7–15.5)
Lymphs Abs: 2182 cells/uL (ref 850–3900)
MCH: 27.2 pg (ref 27.0–33.0)
MCHC: 33.1 g/dL (ref 32.0–36.0)
MCV: 82.4 fL (ref 80.0–100.0)
MONOS PCT: 8.1 %
MPV: 10.9 fL (ref 7.5–12.5)
NEUTROS ABS: 3416 {cells}/uL (ref 1500–7800)
Neutrophils Relative %: 55.1 %
Platelets: 185 10*3/uL (ref 140–400)
RBC: 4.59 10*6/uL (ref 3.80–5.10)
RDW: 14.1 % (ref 11.0–15.0)
TOTAL LYMPHOCYTE: 35.2 %
WBC: 6.2 10*3/uL (ref 3.8–10.8)
WBCMIX: 502 {cells}/uL (ref 200–950)

## 2017-12-20 LAB — COMPLETE METABOLIC PANEL WITH GFR
AG Ratio: 1.7 (calc) (ref 1.0–2.5)
ALKALINE PHOSPHATASE (APISO): 89 U/L (ref 33–130)
ALT: 18 U/L (ref 6–29)
AST: 19 U/L (ref 10–35)
Albumin: 4.1 g/dL (ref 3.6–5.1)
BUN/Creatinine Ratio: 16 (calc) (ref 6–22)
BUN: 17 mg/dL (ref 7–25)
CALCIUM: 9 mg/dL (ref 8.6–10.4)
CO2: 26 mmol/L (ref 20–32)
CREATININE: 1.05 mg/dL — AB (ref 0.50–0.99)
Chloride: 107 mmol/L (ref 98–110)
GFR, EST NON AFRICAN AMERICAN: 56 mL/min/{1.73_m2} — AB (ref >=60)
GFR, Est African American: 65 mL/min/{1.73_m2} (ref >=60)
Globulin: 2.4 g/dL (calc) (ref 1.9–3.7)
Glucose, Bld: 106 mg/dL — ABNORMAL HIGH (ref 65–99)
POTASSIUM: 4.2 mmol/L (ref 3.5–5.3)
SODIUM: 142 mmol/L (ref 135–146)
Total Bilirubin: 0.5 mg/dL (ref 0.2–1.2)
Total Protein: 6.5 g/dL (ref 6.1–8.1)

## 2017-12-21 NOTE — Telephone Encounter (Signed)
Form signed by SN and placed in sealed envelope.  Notified Patient and she stated that she will pick up later this week.  Envelope placed at front desk for pick up.  Nothing further at this time.

## 2017-12-27 ENCOUNTER — Other Ambulatory Visit: Payer: Self-pay | Admitting: Rheumatology

## 2017-12-27 NOTE — Telephone Encounter (Signed)
Last Visit: 10/26/17 Next Visit: 01/27/18 Labs: 12/20/17 Glucose is 106. Creatinine and GFR are stable. All other labs are WNL. TB Gold: 10/26/17 Neg   Okay to refill per Dr. Corliss Skainseveshwar

## 2017-12-28 ENCOUNTER — Other Ambulatory Visit: Payer: Self-pay | Admitting: *Deleted

## 2017-12-28 MED ORDER — FOLIC ACID 1 MG PO TABS: 2.0000 mg | ORAL_TABLET | Freq: Every day | ORAL | 4 refills | Status: DC

## 2017-12-28 NOTE — Telephone Encounter (Signed)
Refill request received via fax  Last Visit: 10/26/17 Next Visit: 01/27/18  Okay to refill per Dr. Corliss Skainseveshwar

## 2018-01-11 ENCOUNTER — Telehealth: Payer: Self-pay | Admitting: Rheumatology

## 2018-01-11 MED ORDER — SECUKINUMAB 150 MG/ML ~~LOC~~ SOAJ: 300.0000 mg | SUBCUTANEOUS | 0 refills | Status: DC

## 2018-01-11 NOTE — Telephone Encounter (Signed)
Patient calling in reference to Cosentyx injection. Patient due for 8 week injection. Patient has called [facility] without results. Please call to advise.

## 2018-01-11 NOTE — Telephone Encounter (Addendum)
[facility] and they state they filled the week 4 prescription in the beginning of July but the same prescription was sent in on 12/27/2017 so they were calling for clarification because they could not fill that exact prescription since their records indicate patient had completed the loading dose.  I had sent a new prescription for a maintenance dose when I got off of the phone with the patient earlier. The pharmacy stated they had not received that prescription but the pharmacist took a verbal prescription for 300mg  subcutaneous every 4 weeks. I called the patient and explained that the pharmacy would now be getting her medication ready to ship. I advised patient to call us for a sample if the medication was not received in time. Patient verbalized understanding.

## 2018-01-11 NOTE — Telephone Encounter (Signed)
[facility] calling to get clarification on directions. Needing a maintanace dose rx for Cosentyx. Fax# [phone]

## 2018-01-11 NOTE — Telephone Encounter (Signed)
Returned patient's call and patient states her last cosentyx injection was on 12/14/2017 and she was finished with the loading dose. Patient is due for her maintenance dose and has not received the medication. Patient is to inject 300mg  subcutaneous every 30 days.   Last visit: 10/26/2017 Next visit: 01/27/2018 Labs: 12/20/2017 Glucose is 106. Creatinine and GFR are stable. All other labs are WNL. Tb Gold: 10/26/2017 negative   Okay to refill per Dr. Corliss Skainseveshwar.

## 2018-01-24 DIAGNOSIS — F902 Attention-deficit hyperactivity disorder, combined type: Secondary | ICD-10-CM | POA: Diagnosis not present

## 2018-01-24 DIAGNOSIS — F411 Generalized anxiety disorder: Secondary | ICD-10-CM | POA: Diagnosis not present

## 2018-01-24 DIAGNOSIS — F422 Mixed obsessional thoughts and acts: Secondary | ICD-10-CM | POA: Diagnosis not present

## 2018-01-24 NOTE — Progress Notes (Signed)
Office Visit Note  Patient: Sarah Horton             Date of Birth: 1951/03/13           MRN: [id number]             PCP: Michele Mcalpine, MD Referring: Michele Mcalpine, MD Visit Date: 01/27/2018 Occupation: @GUAROCC @  Subjective:  Right achilles tendonitis   History of Present Illness: Sarah Horton is a 67 y.o. female with history of psoriatic arthritis and Raynaud's. She is injecting Cosentyx, taking MTX 3 tablets po once weekly, and folic acid 1 mg po daily.  Patient denies missing any doses of her medications recently.  She states that she has noted significant benefit since starting on Cosentyx.  She states that she feels that it is a concern takes last for the full month.  She states that she has noticed significant improvement in her joint stiffness.  She states she still has some pain in her right first MCP joint.  She continues to have right Achilles tendinitis.  She denies any plantar fasciitis.  She states that she has bilateral SI joint pain if she is standing for prolonged peers of time or sitting for prolonged peers of time.  She states that her bilateral shoulder pain is improved significantly.  She states that overall her knee replacements are doing well but occasionally are stiff. She has no psoriasis at this time.  She reports that she has been having increased dry skin on her face and hands.  She states that she is planning to establish with a dermatologist closer to home.  She states that she continues to have intermittent symptoms of Raynolds but denies any digital ulcerations.   Activities of Daily Living:  Patient reports morning stiffness for 30  minutes.   Patient Denies nocturnal pain.  Difficulty dressing/grooming: Denies Difficulty climbing stairs: Denies Difficulty getting out of chair: Denies Difficulty using hands for taps, buttons, cutlery, and/or writing: Denies  Review of Systems  Constitutional: Positive for fatigue.  HENT: Negative for  mouth sores, mouth dryness and nose dryness.   Eyes: Negative for pain, visual disturbance and dryness.  Respiratory: Negative for cough, hemoptysis, shortness of breath and difficulty breathing.   Cardiovascular: Negative for chest pain, palpitations, hypertension and swelling in legs/feet.  Gastrointestinal: Negative for blood in stool, constipation and diarrhea.  Endocrine: Negative for increased urination.  Genitourinary: Negative for painful urination.  Musculoskeletal: Positive for arthralgias, joint pain, joint swelling and morning stiffness. Negative for myalgias, muscle weakness, muscle tenderness and myalgias.  Skin: Negative for color change, pallor, rash, hair loss, nodules/bumps, skin tightness, ulcers and sensitivity to sunlight.  Allergic/Immunologic: Negative for susceptible to infections.  Neurological: Positive for headaches. Negative for dizziness, numbness and weakness.  Hematological: Negative for swollen glands.  Psychiatric/Behavioral: Negative for depressed mood and sleep disturbance. The patient is not nervous/anxious.     PMFS History:  Patient Active Problem List   Diagnosis Date Noted  . History of peripheral neuropathy 11/20/2016  . Left wrist tendonitis/ history of surgery for Dequervains  11/20/2016  . Raynaud's disease without gangrene 09/22/2016  . High risk medication use 09/22/2016  . History of ulcer of lower limb/ Venous stasis ulcer 01/2015  09/22/2016  . Osteopenia of multiple sites 09/22/2016  . Psoriasis 09/03/2016  . Chest wall pain 07/16/2016  . Flank pain, acute 05/15/2016  . Subacromial bursitis of right shoulder joint 04/10/2016  . Low back pain 04/09/2016  .  Pre-ulcerative calluses 02/11/2016  . Varicose veins of right lower extremity with complications 08/12/2015  . Elevated BP 08/09/2015  . Varicose veins of left lower extremity with complications 07/29/2015  . Varicose veins of bilateral lower extremities with other complications  07/09/2015  . Varicose veins of lower extremities with ulcer (HCC) 04/05/2015  . Status post bilateral knee replacements 12/13/2013  . Venous stasis ulcer of ankle (HCC) 06/06/2013  . ADD (attention deficit disorder) 04/04/2012  . INSOMNIA 06/20/2010  . Hypothyroidism 01/17/2010  . Obesity 01/17/2010  . Osteoarthritis 01/17/2010  . ANEMIA 10/02/2009  . SINUSITIS, ACUTE 10/02/2009  . Asthma 10/02/2009  . Venous (peripheral) insufficiency 03/11/2008  . Allergic rhinitis 03/11/2008  . BRONCHITIS, RECURRENT 03/11/2008  . History of migraine 03/11/2008  . Anxiety state 11/24/2007  . Hereditary and idiopathic peripheral neuropathy 11/24/2007  . GERD 11/24/2007  . IRRITABLE BOWEL SYNDROME 11/24/2007  . PSORIATIC ARTHROPATHY 11/24/2007    Past Medical History:  Diagnosis Date  . Allergic rhinitis, cause unspecified   . Allergy   . Anemia, unspecified   . Anxiety state, unspecified   . Arthritis   . Cataract    bilaterally removed 2003,2007  . Esophageal reflux   . Irritable bowel syndrome   . Migraine   . Obesity, unspecified   . Psoriatic arthropathy (HCC)   . Unspecified chronic bronchitis (HCC)   . Unspecified hereditary and idiopathic peripheral neuropathy   . Unspecified hypothyroidism   . Unspecified venous (peripheral) insufficiency     Family History  Problem Relation Age of Onset  . Cirrhosis Father   . COPD Mother   . Heart disease Mother   . Hypertension Mother   . Varicose Veins Mother   . Breast cancer Maternal Grandmother   . Breast cancer Other        1st cousin   . Pancreatic cancer Other        1st cousin   . Thyroid disease Sister   . Colon cancer Neg Hx   . Colon polyps Neg Hx   . Esophageal cancer Neg Hx   . Rectal cancer Neg Hx   . Stomach cancer Neg Hx    Past Surgical History:  Procedure Laterality Date  . ABDOMINAL HYSTERECTOMY    . ABDOMINAL HYSTERECTOMY    . ABLATION ON ENDOMETRIOSIS     x2  . Bilat TKRs  04/2010   by [name]  .  BUNIONECTOMY  2009  . CATARACT EXTRACTION, BILATERAL     [id number],2007  . COLONOSCOPY  08-26-2004   with gessner tics and hems   . dequervains tensenovitis Left   . JOINT REPLACEMENT Bilateral    knees  . NASAL SINUS SURGERY    . SHOULDER SURGERY  2005  . TOTAL SHOULDER ARTHROPLASTY     Social History   Social History Narrative  . Not on file    Objective: Vital Signs: BP 139/84 (BP Location: Left Arm, Patient Position: Sitting, Cuff Size: Large)   Pulse (!) 103   Resp 16   Ht 5' 8.5" (1.74 m)   Wt 238 lb 12.8 oz (108.3 kg)   BMI 35.78 kg/m    Physical Exam  Constitutional: She is oriented to person, place, and time. She appears well-developed and well-nourished.  HENT:  Head: Normocephalic and atraumatic.  Eyes: Conjunctivae and EOM are normal.  Neck: Normal range of motion.  Cardiovascular: Normal rate, regular rhythm, normal heart sounds and intact distal pulses.  Pulmonary/Chest: Effort normal and breath sounds normal.  Abdominal: Soft. Bowel sounds are normal.  Lymphadenopathy:    She has no cervical adenopathy.  Neurological: She is alert and oriented to person, place, and time.  Skin: Skin is warm and dry. Capillary refill takes less than 2 seconds.  Psychiatric: She has a normal mood and affect. Her behavior is normal.  Nursing note and vitals reviewed.    Musculoskeletal Exam: C-spine, thoracic spine, lumbar spine good range of motion.  No midline spinal tenderness.  No SI joint tenderness.  Shoulder joints, elbow joints, wrist joints, MCPs, PIPs, DIPs good range of motion with no synovitis.  She has PIP and DIP synovial thickening.  She is complete fist formation bilaterally.  Hip joints, knee joints, ankle joints, MTPs, PIPs, DIPs good range of motion no synovitis.  Right Achilles tendinitis.  No tenderness of trochanteric bursa on exam.  No warmth or effusion bilateral knee replacements.  CDAI Exam: CDAI Score: Not documented Patient Global Assessment: Not  documented; Provider Global Assessment: Not documented Swollen: Not documented; Tender: Not documented Joint Exam   Not documented   There is currently no information documented on the homunculus. Go to the Rheumatology activity and complete the homunculus joint exam.  Investigation: No additional findings.  Imaging: No results found.  Recent Labs: Lab Results  Component Value Date   WBC 6.2 12/20/2017   HGB 12.5 12/20/2017   PLT 185 12/20/2017   NA 142 12/20/2017   K 4.2 12/20/2017   CL 107 12/20/2017   CO2 26 12/20/2017   GLUCOSE 106 (H) 12/20/2017   BUN 17 12/20/2017   CREATININE 1.05 (H) 12/20/2017   BILITOT 0.5 12/20/2017   ALKPHOS 87 01/25/2017   AST 19 12/20/2017   ALT 18 12/20/2017   PROT 6.5 12/20/2017   ALBUMIN 4.1 01/25/2017   CALCIUM 9.0 12/20/2017   GFRAA 65 12/20/2017   QFTBGOLDPLUS NEGATIVE 10/26/2017    Speciality Comments: No specialty comments available.  Procedures:  No procedures performed Allergies: Nucynta [tapentadol]; Codeine; and Penicillins   Assessment / Plan:     Visit Diagnoses: Psoriatic arthropathy (HCC): She has no synovitis or dactylitis on exam.  She was started on Cosentyx on 11/16/2017.  She continues to take methotrexate 3 tablets by mouth once weekly and folic acid 1 mg daily.  She has noticed significant benefit since starting on Cosentyx.  She feels like Cosentyx has been lasting a full month without any breakthrough pain.  Her joint stiffness has improved.  She continues to have right Achilles tendinitis.  She has tenderness of her right first MCP joint.  She has tenderness of bilateral SI joints on exam.  She will continue on his current treatment regimen.  She was advised to notify us if she develops increased joint pain or joint swelling.  She will follow-up in the office in 5 months.  Psoriasis: She has no psoriasis at this time.   High risk medication use - Cosentyx, MTX 3 tabs po q wk, folic acid 1mg  po qd, ( Inadequate  response to Enbrel and Otezla).  CBC and CMP were drawn on 12/20/17.  She will return in October and every 3 months.  TB gold negative 10/26/17.    Sacroiliitis (HCC): She has bilateral SI joint tenderness.   Status post bilateral knee replacements: Doing well.  No warmth or effusion.  She has good ROM of both knee replacements.   Raynaud's disease without gangrene: She has intermittent symptoms of Raynaud's.  No digital ulcerations or signs of gangrene noted.  We  discussed keeping core body temperature warm and to wear gloves.  Chronic midline low back pain without sciatica: She has no midline spinal tenderness.  She has no symptoms of sciatica.   History of ulcer of lower limb/ Venous stasis ulcer 01/2015   Left wrist tendonitis/ history of surgery for Dequervains   Osteopenia of multiple sites: She takes vitamin D 1,000 units by mouth daily.    Primary insomnia  Other medical conditions are listed as follows:   History of hypertension  History of gastroesophageal reflux (GERD)  History of peripheral neuropathy  History of IBS  History of migraine  History of asthma  History of hypothyroidism   Orders: No orders of the defined types were placed in this encounter.  No orders of the defined types were placed in this encounter.   Face-to-face time spent with patient was 30 minutes. Greater than 50% of time was spent in counseling and coordination of care.  Follow-Up Instructions: Return in about 5 months (around 06/29/2018) for Psoriatic arthritis.   Gearldine Bienenstock, PA-C   I examined and evaluated the patient with Sherron Ales PA.  Patient had no synovitis on my examination today.  Although she continues to have some discomfort in her joints.  The plan of care was discussed as noted above.  Pollyann Savoy, MD  Note - This record has been created using Animal nutritionist.  Chart creation errors have been sought, but may not always  have been located. Such creation errors  do not reflect on  the standard of medical care.

## 2018-01-26 ENCOUNTER — Telehealth: Payer: Self-pay | Admitting: Pharmacy Technician

## 2018-01-26 NOTE — Telephone Encounter (Signed)
Findings of benefits investigation via test claims: Cosentyx 300mg    Insurance: [employer]/ [employer]  Phone: [phone]  Refill too soon, next refill due 02/03/18.   2:03 PM Dorthula Nettlesachael N Rosalena Mccorry, CPhT

## 2018-01-27 ENCOUNTER — Ambulatory Visit: Payer: BLUE CROSS/BLUE SHIELD | Admitting: Rheumatology

## 2018-01-27 ENCOUNTER — Encounter: Payer: Self-pay | Admitting: Rheumatology

## 2018-01-27 VITALS — BP 139/84 | HR 103 | Resp 16 | Ht 68.5 in | Wt 238.8 lb

## 2018-01-27 DIAGNOSIS — L409 Psoriasis, unspecified: Secondary | ICD-10-CM

## 2018-01-27 DIAGNOSIS — Z8709 Personal history of other diseases of the respiratory system: Secondary | ICD-10-CM

## 2018-01-27 DIAGNOSIS — L4050 Arthropathic psoriasis, unspecified: Secondary | ICD-10-CM

## 2018-01-27 DIAGNOSIS — Z79899 Other long term (current) drug therapy: Secondary | ICD-10-CM | POA: Diagnosis not present

## 2018-01-27 DIAGNOSIS — F5101 Primary insomnia: Secondary | ICD-10-CM

## 2018-01-27 DIAGNOSIS — Z8679 Personal history of other diseases of the circulatory system: Secondary | ICD-10-CM

## 2018-01-27 DIAGNOSIS — M461 Sacroiliitis, not elsewhere classified: Secondary | ICD-10-CM | POA: Diagnosis not present

## 2018-01-27 DIAGNOSIS — Z8719 Personal history of other diseases of the digestive system: Secondary | ICD-10-CM

## 2018-01-27 DIAGNOSIS — Z8669 Personal history of other diseases of the nervous system and sense organs: Secondary | ICD-10-CM

## 2018-01-27 DIAGNOSIS — Z872 Personal history of diseases of the skin and subcutaneous tissue: Secondary | ICD-10-CM

## 2018-01-27 DIAGNOSIS — Z96653 Presence of artificial knee joint, bilateral: Secondary | ICD-10-CM

## 2018-01-27 DIAGNOSIS — M545 Low back pain: Secondary | ICD-10-CM

## 2018-01-27 DIAGNOSIS — Z8639 Personal history of other endocrine, nutritional and metabolic disease: Secondary | ICD-10-CM

## 2018-01-27 DIAGNOSIS — M778 Other enthesopathies, not elsewhere classified: Secondary | ICD-10-CM

## 2018-01-27 DIAGNOSIS — M8589 Other specified disorders of bone density and structure, multiple sites: Secondary | ICD-10-CM

## 2018-01-27 DIAGNOSIS — G8929 Other chronic pain: Secondary | ICD-10-CM

## 2018-01-27 DIAGNOSIS — I7300 Raynaud's syndrome without gangrene: Secondary | ICD-10-CM

## 2018-01-27 NOTE — Patient Instructions (Signed)
Standing Labs We placed an order today for your standing lab work.   Please come back and get your standing labs in October and every 3 months   We have open lab Monday through Friday from 8:30-11:30 AM and 1:30-4:00 PM  at the office of Dr. Shaili Deveshwar.   You may experience shorter wait times on Monday and Friday afternoons. The office is located at [address], [address], [place], [place] [address] No appointment is necessary.   Labs are drawn by [facility].  You may receive a bill from [facility] for your lab work. If you have any questions regarding directions or hours of operation,  please call [phone].    

## 2018-02-08 ENCOUNTER — Other Ambulatory Visit: Payer: Self-pay | Admitting: Rheumatology

## 2018-02-08 NOTE — Telephone Encounter (Signed)
Last Visit: 01/27/18 Next Visit: 06/30/18 Labs: 12/20/17 Glucose is 106. Creatinine and GFR are stable. All other labs are WNL.  Okay to refill per Dr. Corliss Skains

## 2018-03-22 ENCOUNTER — Other Ambulatory Visit: Payer: Self-pay | Admitting: Rheumatology

## 2018-03-22 NOTE — Telephone Encounter (Addendum)
Last Visit: 01/27/18 Next Visit: 06/30/18 Labs: 12/20/17 Glucose is 106. Creatinine and GFR are stable. All other labs are WNL. TB Gold: 10/26/17 Neg   Left message to remind patient she is due for labs this month.   Okay to refill per Dr. Corliss Skains

## 2018-03-24 ENCOUNTER — Other Ambulatory Visit: Payer: Self-pay

## 2018-03-24 DIAGNOSIS — Z79899 Other long term (current) drug therapy: Secondary | ICD-10-CM

## 2018-03-24 LAB — COMPLETE METABOLIC PANEL WITH GFR
AG RATIO: 1.6 (calc) (ref 1.0–2.5)
ALT: 17 U/L (ref 6–29)
AST: 18 U/L (ref 10–35)
Albumin: 3.9 g/dL (ref 3.6–5.1)
Alkaline phosphatase (APISO): 80 U/L (ref 33–130)
BUN/Creatinine Ratio: 13 (calc) (ref 6–22)
BUN: 13 mg/dL (ref 7–25)
CALCIUM: 8.8 mg/dL (ref 8.6–10.4)
CO2: 26 mmol/L (ref 20–32)
Chloride: 106 mmol/L (ref 98–110)
Creat: 1.02 mg/dL — ABNORMAL HIGH (ref 0.50–0.99)
GFR, EST AFRICAN AMERICAN: 67 mL/min/{1.73_m2} (ref >=60)
GFR, EST NON AFRICAN AMERICAN: 58 mL/min/{1.73_m2} — AB (ref >=60)
GLOBULIN: 2.4 g/dL (ref 1.9–3.7)
Glucose, Bld: 96 mg/dL (ref 65–99)
POTASSIUM: 4.5 mmol/L (ref 3.5–5.3)
SODIUM: 139 mmol/L (ref 135–146)
TOTAL PROTEIN: 6.3 g/dL (ref 6.1–8.1)
Total Bilirubin: 0.4 mg/dL (ref 0.2–1.2)

## 2018-03-24 LAB — CBC WITH DIFFERENTIAL/PLATELET
BASOS ABS: 58 {cells}/uL (ref 0–200)
Basophils Relative: 0.8 %
EOS ABS: 131 {cells}/uL (ref 15–500)
Eosinophils Relative: 1.8 %
HCT: 36.3 % (ref 35.0–45.0)
HEMOGLOBIN: 12.4 g/dL (ref 11.7–15.5)
Lymphs Abs: 2511 cells/uL (ref 850–3900)
MCH: 28.6 pg (ref 27.0–33.0)
MCHC: 34.2 g/dL (ref 32.0–36.0)
MCV: 83.6 fL (ref 80.0–100.0)
MPV: 11.1 fL (ref 7.5–12.5)
Monocytes Relative: 8.4 %
Neutro Abs: 3986 cells/uL (ref 1500–7800)
Neutrophils Relative %: 54.6 %
PLATELETS: 192 10*3/uL (ref 140–400)
RBC: 4.34 10*6/uL (ref 3.80–5.10)
RDW: 14.6 % (ref 11.0–15.0)
TOTAL LYMPHOCYTE: 34.4 %
WBC mixed population: 613 cells/uL (ref 200–950)
WBC: 7.3 10*3/uL (ref 3.8–10.8)

## 2018-04-20 ENCOUNTER — Other Ambulatory Visit: Payer: Self-pay | Admitting: Rheumatology

## 2018-04-20 NOTE — Telephone Encounter (Addendum)
Last Visit: 01/27/18 Next Visit: 06/30/18 Labs: 03/24/18 CBC WNL. Creatinine and GFR mildly elevated but stable. All other labs are WNL. TB Gold: 10/26/17 Neg   Okay to refill per Dr. Corliss Skainseveshwar

## 2018-04-30 ENCOUNTER — Other Ambulatory Visit: Payer: Self-pay | Admitting: Rheumatology

## 2018-05-02 ENCOUNTER — Other Ambulatory Visit: Payer: Self-pay | Admitting: Rheumatology

## 2018-05-02 NOTE — Telephone Encounter (Signed)
Last Visit: 01/27/2018 Next Visit: 06/30/2018 Labs: 03/24/2018 CBC WNL. Creatinine and GFR mildly elevated but stable. All other labs are WNL  Okay to refill per Dr. Corliss Skainseveshwar.

## 2018-05-30 ENCOUNTER — Other Ambulatory Visit: Payer: Self-pay

## 2018-05-30 DIAGNOSIS — Z79899 Other long term (current) drug therapy: Secondary | ICD-10-CM | POA: Diagnosis not present

## 2018-05-31 LAB — COMPLETE METABOLIC PANEL WITH GFR
AG Ratio: 1.5 (calc) (ref 1.0–2.5)
ALBUMIN MSPROF: 4.1 g/dL (ref 3.6–5.1)
ALT: 15 U/L (ref 6–29)
AST: 18 U/L (ref 10–35)
Alkaline phosphatase (APISO): 79 U/L (ref 33–130)
BILIRUBIN TOTAL: 0.5 mg/dL (ref 0.2–1.2)
BUN: 15 mg/dL (ref 7–25)
CHLORIDE: 106 mmol/L (ref 98–110)
CO2: 27 mmol/L (ref 20–32)
Calcium: 9.3 mg/dL (ref 8.6–10.4)
Creat: 0.96 mg/dL (ref 0.50–0.99)
GFR, EST AFRICAN AMERICAN: 72 mL/min/{1.73_m2} (ref >=60)
GFR, EST NON AFRICAN AMERICAN: 62 mL/min/{1.73_m2} (ref >=60)
GLUCOSE: 93 mg/dL (ref 65–99)
Globulin: 2.7 g/dL (calc) (ref 1.9–3.7)
Potassium: 4.3 mmol/L (ref 3.5–5.3)
Sodium: 139 mmol/L (ref 135–146)
TOTAL PROTEIN: 6.8 g/dL (ref 6.1–8.1)

## 2018-05-31 LAB — CBC WITH DIFFERENTIAL/PLATELET
ABSOLUTE MONOCYTES: 647 {cells}/uL (ref 200–950)
BASOS PCT: 0.8 %
Basophils Absolute: 62 cells/uL (ref 0–200)
EOS PCT: 1.9 %
Eosinophils Absolute: 146 cells/uL (ref 15–500)
HCT: 38.5 % (ref 35.0–45.0)
Hemoglobin: 12.7 g/dL (ref 11.7–15.5)
Lymphs Abs: 2418 cells/uL (ref 850–3900)
MCH: 28.2 pg (ref 27.0–33.0)
MCHC: 33 g/dL (ref 32.0–36.0)
MCV: 85.6 fL (ref 80.0–100.0)
MPV: 11 fL (ref 7.5–12.5)
Monocytes Relative: 8.4 %
NEUTROS PCT: 57.5 %
Neutro Abs: 4428 cells/uL (ref 1500–7800)
PLATELETS: 225 10*3/uL (ref 140–400)
RBC: 4.5 10*6/uL (ref 3.80–5.10)
RDW: 14.2 % (ref 11.0–15.0)
TOTAL LYMPHOCYTE: 31.4 %
WBC: 7.7 10*3/uL (ref 3.8–10.8)

## 2018-06-06 DIAGNOSIS — H6502 Acute serous otitis media, left ear: Secondary | ICD-10-CM | POA: Diagnosis not present

## 2018-06-17 NOTE — Progress Notes (Signed)
Office Visit Note  Patient: Sarah BachCatherine M Marceaux             Date of Birth: August 29, 1950           MRN: [id number]             PCP: Michele McalpineNadel, Scott M, MD Referring: Michele McalpineNadel, Scott M, MD Visit Date: 06/30/2018 Occupation: @GUAROCC @  Subjective:  Pain in multiple joints   History of Present Illness: Sarah BachCatherine M Diantonio is a 68 y.o. female with history of psoriatic arthritis and Raynaud's syndrome. She was started on Cosentyx 300 mg sq injections every 28 days in June 2019.  She continues to take MTX 3 tablets by mouth once weekly and folic acid 2 mg po daily.  She was recently started on Cymbalta 30 mg po daily which has helped with the pain she has been experiencing.  She reports she is having pain in both hands, right wrist, and right elbow pain.  She continues to have right achilles tendonitis and bilateral SI joint pain.  She denies any psoriasis at this time. She is due for her next cosentyx injection tomorrow.   Activities of Daily Living:  Patient reports morning stiffness for 1  hour.   Patient Reports nocturnal pain.  Difficulty dressing/grooming: Denies Difficulty climbing stairs: Denies Difficulty getting out of chair: Denies Difficulty using hands for taps, buttons, cutlery, and/or writing: Reports  Review of Systems  Constitutional: Positive for fatigue.  HENT: Positive for mouth dryness. Negative for mouth sores and nose dryness.   Eyes: Negative for pain, visual disturbance and dryness.  Respiratory: Negative for cough, hemoptysis, shortness of breath and difficulty breathing.   Cardiovascular: Negative for chest pain, palpitations, hypertension and swelling in legs/feet.  Gastrointestinal: Negative for blood in stool, constipation and diarrhea.  Endocrine: Negative for increased urination.  Genitourinary: Negative for painful urination.  Musculoskeletal: Positive for arthralgias, joint pain, joint swelling and morning stiffness. Negative for myalgias, muscle weakness,  muscle tenderness and myalgias.  Skin: Negative for color change, pallor, rash, hair loss, nodules/bumps, skin tightness, ulcers and sensitivity to sunlight.  Allergic/Immunologic: Negative for susceptible to infections.  Neurological: Negative for dizziness, numbness, headaches and weakness.  Hematological: Negative for swollen glands.  Psychiatric/Behavioral: Negative for depressed mood and sleep disturbance. The patient is not nervous/anxious.     PMFS History:  Patient Active Problem List   Diagnosis Date Noted  . History of peripheral neuropathy 11/20/2016  . Left wrist tendonitis/ history of surgery for Dequervains  11/20/2016  . Raynaud's disease without gangrene 09/22/2016  . High risk medication use 09/22/2016  . History of ulcer of lower limb/ Venous stasis ulcer 01/2015  09/22/2016  . Osteopenia of multiple sites 09/22/2016  . Psoriasis 09/03/2016  . Chest wall pain 07/16/2016  . Flank pain, acute 05/15/2016  . Subacromial bursitis of right shoulder joint 04/10/2016  . Low back pain 04/09/2016  . Pre-ulcerative calluses 02/11/2016  . Varicose veins of right lower extremity with complications 08/12/2015  . Elevated BP 08/09/2015  . Varicose veins of left lower extremity with complications 07/29/2015  . Varicose veins of bilateral lower extremities with other complications 07/09/2015  . Varicose veins of lower extremities with ulcer (HCC) 04/05/2015  . Status post bilateral knee replacements 12/13/2013  . Venous stasis ulcer of ankle (HCC) 06/06/2013  . ADD (attention deficit disorder) 04/04/2012  . INSOMNIA 06/20/2010  . Hypothyroidism 01/17/2010  . Obesity 01/17/2010  . Osteoarthritis 01/17/2010  . ANEMIA 10/02/2009  . SINUSITIS, ACUTE 10/02/2009  .  Asthma 10/02/2009  . Venous (peripheral) insufficiency 03/11/2008  . Allergic rhinitis 03/11/2008  . BRONCHITIS, RECURRENT 03/11/2008  . History of migraine 03/11/2008  . Anxiety state 11/24/2007  . Hereditary and  idiopathic peripheral neuropathy 11/24/2007  . GERD 11/24/2007  . IRRITABLE BOWEL SYNDROME 11/24/2007  . PSORIATIC ARTHROPATHY 11/24/2007    Past Medical History:  Diagnosis Date  . Allergic rhinitis, cause unspecified   . Allergy   . Anemia, unspecified   . Anxiety state, unspecified   . Arthritis   . Cataract    bilaterally removed 2003,2007  . Esophageal reflux   . Irritable bowel syndrome   . Migraine   . Obesity, unspecified   . Psoriatic arthropathy (HCC)   . Unspecified chronic bronchitis (HCC)   . Unspecified hereditary and idiopathic peripheral neuropathy   . Unspecified hypothyroidism   . Unspecified venous (peripheral) insufficiency     Family History  Problem Relation Age of Onset  . Cirrhosis Father   . COPD Mother   . Heart disease Mother   . Hypertension Mother   . Varicose Veins Mother   . Breast cancer Maternal Grandmother   . Breast cancer Other        1st cousin   . Pancreatic cancer Other        1st cousin   . Thyroid disease Sister   . Colon cancer Neg Hx   . Colon polyps Neg Hx   . Esophageal cancer Neg Hx   . Rectal cancer Neg Hx   . Stomach cancer Neg Hx    Past Surgical History:  Procedure Laterality Date  . ABDOMINAL HYSTERECTOMY    . ABDOMINAL HYSTERECTOMY    . ABLATION ON ENDOMETRIOSIS     x2  . Bilat TKRs  04/2010   by [name]  . BUNIONECTOMY  2009  . CATARACT EXTRACTION, BILATERAL     [id number]  . COLONOSCOPY  08-26-2004   with gessner tics and hems   . dequervains tensenovitis Left   . JOINT REPLACEMENT Bilateral    knees  . NASAL SINUS SURGERY    . SHOULDER SURGERY  2005  . TOTAL SHOULDER ARTHROPLASTY     Social History   Social History Narrative  . Not on file   Immunization History  Administered Date(s) Administered  . H1N1 05/18/2008  . Influenza Inj Mdck Quad Pf 04/01/2017  . Influenza Split 03/23/2011, 03/14/2012, 03/23/2013, 03/22/2016  . Influenza Whole 03/08/2008, 03/08/2009, 03/07/2010  .  Influenza,inj,Quad PF,6+ Mos 03/21/2014  . Td 01/06/2002  . Tdap 03/14/2012     Objective: Vital Signs: BP 130/80 (BP Location: Left Arm, Patient Position: Sitting, Cuff Size: Large)   Pulse 82   Resp 12   Ht 5' 8.5" (1.74 m)   Wt 243 lb 9.6 oz (110.5 kg)   BMI 36.50 kg/m    Physical Exam Vitals signs and nursing note reviewed.  Constitutional:      Appearance: She is well-developed.  HENT:     Head: Normocephalic and atraumatic.  Eyes:     Conjunctiva/sclera: Conjunctivae normal.  Neck:     Musculoskeletal: Normal range of motion.  Cardiovascular:     Rate and Rhythm: Normal rate and regular rhythm.     Heart sounds: Normal heart sounds.  Pulmonary:     Effort: Pulmonary effort is normal.     Breath sounds: Normal breath sounds.  Abdominal:     General: Bowel sounds are normal.     Palpations: Abdomen is soft.  Lymphadenopathy:  Cervical: No cervical adenopathy.  Skin:    General: Skin is warm and dry.     Capillary Refill: Capillary refill takes less than 2 seconds.  Neurological:     Mental Status: She is alert and oriented to person, place, and time.  Psychiatric:        Behavior: Behavior normal.      Musculoskeletal Exam: C-spine, thoracic spine, and lumbar spine good ROM.  No midline spinal tenderness. Bilateral SI joint tenderness.  Shoulder joints, elbow joints, wrist joints, MCPs, PIPs , and DIPs good ROM with no synovitis. Right 1st MCP joint synovial thickening and synovitis.  PIP and DIP synovial thickening consistent with osteoarthritis. Medial epicondylitis of the right elbow joint.  Tenderness over the right ulnar styloid.  Hip joints, knee joints, ankle joints, MTPs, PIPs, and DIPs good ROM with no synovitis. No warmth or effusion of knee joints. Right achilles tendonitis.    CDAI Exam: CDAI Score: Not documented Patient Global Assessment: Not documented; Provider Global Assessment: Not documented Swollen: 1 ; Tender: 4  Joint Exam      Right   Left  Wrist   Tender     MCP 1  Swollen Tender     Sacroiliac   Tender   Tender     Investigation: No additional findings.  Imaging: No results found.  Recent Labs: Lab Results  Component Value Date   WBC 7.7 05/30/2018   HGB 12.7 05/30/2018   PLT 225 05/30/2018   NA 139 05/30/2018   K 4.3 05/30/2018   CL 106 05/30/2018   CO2 27 05/30/2018   GLUCOSE 93 05/30/2018   BUN 15 05/30/2018   CREATININE 0.96 05/30/2018   BILITOT 0.5 05/30/2018   ALKPHOS 87 01/25/2017   AST 18 05/30/2018   ALT 15 05/30/2018   PROT 6.8 05/30/2018   ALBUMIN 4.1 01/25/2017   CALCIUM 9.3 05/30/2018   GFRAA 72 05/30/2018   QFTBGOLDPLUS NEGATIVE 10/26/2017    Speciality Comments: No specialty comments available.  Procedures:  No procedures performed Allergies: Nucynta [tapentadol]; Codeine; and Penicillins   Assessment / Plan:     Visit Diagnoses: Psoriatic arthropathy (HCC): She has synovitis of the right 1st MCP joint.  She continues to have bilateral SI joint tenderness.  She has right achilles tendonitis and right medial epicondylitis.  She was started on Cosentyx 300 mg sq injections every 28 days and is taking MTX 3 tablets by mouth once weekly.  She continues to have pain and intermittent joint swelling in multiple joints.  She feels as though Cosentyx has been more effective than [place].  We discussed splitting the dose of Cosentyx to 150 mg sq injections every 2 weeks and increasing the dose of MTX to 5 tablets by mouth once weekly  She is due for the next Cosentyx injection tomorrow, so she will inject 1 150 mg pen tomorrow.  She will return in 2 weeks to check lab work.  She was advised to notify us if she develops increased joint pain or joint swelling.  She will follow up in 5 months.   Psoriasis: She has no psoriasis at this time.   High risk medication use - started on Cosentyx on 11/16/2017, MTX 3 tablets po once weekly, folic acid 1 mg po daily(Inadequate response to Enbrel and  Otezla).    Sacroiliitis (HCC): She has mild tenderness of bilateral SI joints on exam.    Status post bilateral knee replacements:  She has good ROM with no discomfort.  She has no warmth or effusion on exam.    Raynaud's disease without gangrene: She has intermittent symptoms of Raynaud's.  She has no digital ulcerations or signs of gangrene.  She wears gloves on a regular basis.     History of ulcer of lower limb/ Venous stasis ulcer 01/2015:  No recurrences.    Left wrist tendonitis/ history of surgery for Dequervains: She has no synovitis or tenderness at this time.   Osteopenia of multiple sites: She takes a calcium and vitamin D supplement daily.   Primary insomnia: She has had interrupted sleep at night due to the discomfort she has been experiencing.    Other medical conditions are listed as follows:   History of hypertension  History of gastroesophageal reflux (GERD)  History of peripheral neuropathy  History of IBS  History of migraine  History of asthma  History of hypothyroidism   Orders: No orders of the defined types were placed in this encounter.  No orders of the defined types were placed in this encounter.    Follow-Up Instructions: Return in about 5 months (around 11/29/2018) for Psoriatic arthritis, Raynaud's syndrome.   Gearldine Bienenstockaylor M Clary Boulais, PA-C   I examined and evaluated the patient with Sherron Alesaylor Caydin Yeatts PA.  Patient has been experiencing increased pain and discomfort.  She had active synovitis in some of the joints as described above.  We discussed increasing methotrexate to 5 tablets p.o. weekly.  She had elevated creatinine at the higher dose in the past.  We also discussed a spreading Cosentyx 150 mg every 2 weeks.  We will see response to that.  The plan of care was discussed as noted above.  Pollyann SavoyShaili Deveshwar, MD  Note - This record has been created using [employer] software.  Chart creation errors have been sought, but may not always  have been located.  Such creation errors do not reflect on  the standard of medical care.

## 2018-06-20 DIAGNOSIS — F902 Attention-deficit hyperactivity disorder, combined type: Secondary | ICD-10-CM | POA: Diagnosis not present

## 2018-06-20 DIAGNOSIS — F331 Major depressive disorder, recurrent, moderate: Secondary | ICD-10-CM | POA: Diagnosis not present

## 2018-06-20 DIAGNOSIS — F422 Mixed obsessional thoughts and acts: Secondary | ICD-10-CM | POA: Diagnosis not present

## 2018-06-20 DIAGNOSIS — F411 Generalized anxiety disorder: Secondary | ICD-10-CM | POA: Diagnosis not present

## 2018-06-30 ENCOUNTER — Ambulatory Visit: Payer: BLUE CROSS/BLUE SHIELD | Admitting: Rheumatology

## 2018-06-30 ENCOUNTER — Encounter: Payer: Self-pay | Admitting: Physician Assistant

## 2018-06-30 VITALS — BP 130/80 | HR 82 | Resp 12 | Ht 68.5 in | Wt 243.6 lb

## 2018-06-30 DIAGNOSIS — M461 Sacroiliitis, not elsewhere classified: Secondary | ICD-10-CM | POA: Diagnosis not present

## 2018-06-30 DIAGNOSIS — M8589 Other specified disorders of bone density and structure, multiple sites: Secondary | ICD-10-CM

## 2018-06-30 DIAGNOSIS — Z79899 Other long term (current) drug therapy: Secondary | ICD-10-CM

## 2018-06-30 DIAGNOSIS — Z8639 Personal history of other endocrine, nutritional and metabolic disease: Secondary | ICD-10-CM

## 2018-06-30 DIAGNOSIS — Z8719 Personal history of other diseases of the digestive system: Secondary | ICD-10-CM

## 2018-06-30 DIAGNOSIS — I7300 Raynaud's syndrome without gangrene: Secondary | ICD-10-CM

## 2018-06-30 DIAGNOSIS — L409 Psoriasis, unspecified: Secondary | ICD-10-CM | POA: Diagnosis not present

## 2018-06-30 DIAGNOSIS — L4050 Arthropathic psoriasis, unspecified: Secondary | ICD-10-CM

## 2018-06-30 DIAGNOSIS — F5101 Primary insomnia: Secondary | ICD-10-CM

## 2018-06-30 DIAGNOSIS — Z8709 Personal history of other diseases of the respiratory system: Secondary | ICD-10-CM

## 2018-06-30 DIAGNOSIS — Z872 Personal history of diseases of the skin and subcutaneous tissue: Secondary | ICD-10-CM

## 2018-06-30 DIAGNOSIS — Z96653 Presence of artificial knee joint, bilateral: Secondary | ICD-10-CM

## 2018-06-30 DIAGNOSIS — M778 Other enthesopathies, not elsewhere classified: Secondary | ICD-10-CM

## 2018-06-30 DIAGNOSIS — Z8669 Personal history of other diseases of the nervous system and sense organs: Secondary | ICD-10-CM

## 2018-06-30 DIAGNOSIS — Z8679 Personal history of other diseases of the circulatory system: Secondary | ICD-10-CM

## 2018-06-30 NOTE — Patient Instructions (Addendum)
Standing Labs We placed an order today for your standing lab work.    Please come back and get your standing labs in 2 weeks and every 3 months   We have open lab Monday through Friday from 8:30-11:30 AM and 1:30-4:00 PM  at the office of Dr. Pollyann Savoy.   You may experience shorter wait times on Monday and Friday afternoons. The office is located at [address], [place], [place] [address] No appointment is necessary.   Labs are drawn by [facility].  You may receive a bill from [facility] for your lab work.  If you wish to have your labs drawn at another location, please call the office 24 hours in advance to send orders.  If you have any questions regarding directions or hours of operation,  please call [phone].   Just as a reminder please drink plenty of water prior to coming for your lab work. Thanks!

## 2018-07-12 ENCOUNTER — Other Ambulatory Visit: Payer: Self-pay | Admitting: Rheumatology

## 2018-07-12 NOTE — Telephone Encounter (Signed)
Last Visit: 06/30/18 Next visit: 12/01/18 Labs: 05/30/18 WNL TB Gold: 10/26/17 Neg   Okay to refill per Dr. Corliss Skains

## 2018-07-21 ENCOUNTER — Other Ambulatory Visit: Payer: Self-pay

## 2018-07-21 DIAGNOSIS — F902 Attention-deficit hyperactivity disorder, combined type: Secondary | ICD-10-CM | POA: Diagnosis not present

## 2018-07-21 DIAGNOSIS — F422 Mixed obsessional thoughts and acts: Secondary | ICD-10-CM | POA: Diagnosis not present

## 2018-07-21 DIAGNOSIS — Z79899 Other long term (current) drug therapy: Secondary | ICD-10-CM | POA: Diagnosis not present

## 2018-07-21 DIAGNOSIS — F411 Generalized anxiety disorder: Secondary | ICD-10-CM | POA: Diagnosis not present

## 2018-07-21 DIAGNOSIS — F331 Major depressive disorder, recurrent, moderate: Secondary | ICD-10-CM | POA: Diagnosis not present

## 2018-07-22 ENCOUNTER — Telehealth: Payer: Self-pay | Admitting: *Deleted

## 2018-07-22 DIAGNOSIS — Z79899 Other long term (current) drug therapy: Secondary | ICD-10-CM

## 2018-07-22 LAB — COMPLETE METABOLIC PANEL WITH GFR
AG Ratio: 1.5 (calc) (ref 1.0–2.5)
ALT: 21 U/L (ref 6–29)
AST: 19 U/L (ref 10–35)
Albumin: 4.1 g/dL (ref 3.6–5.1)
Alkaline phosphatase (APISO): 81 U/L (ref 37–153)
BUN/Creatinine Ratio: 16 (calc) (ref 6–22)
BUN: 21 mg/dL (ref 7–25)
CO2: 25 mmol/L (ref 20–32)
Calcium: 9.3 mg/dL (ref 8.6–10.4)
Chloride: 106 mmol/L (ref 98–110)
Creat: 1.32 mg/dL — ABNORMAL HIGH (ref 0.50–0.99)
GFR, EST NON AFRICAN AMERICAN: 42 mL/min/{1.73_m2} — AB (ref >=60)
GFR, Est African American: 49 mL/min/{1.73_m2} — ABNORMAL LOW (ref >=60)
Globulin: 2.7 g/dL (calc) (ref 1.9–3.7)
Glucose, Bld: 84 mg/dL (ref 65–99)
Potassium: 4.7 mmol/L (ref 3.5–5.3)
Sodium: 140 mmol/L (ref 135–146)
Total Bilirubin: 0.6 mg/dL (ref 0.2–1.2)
Total Protein: 6.8 g/dL (ref 6.1–8.1)

## 2018-07-22 LAB — CBC WITH DIFFERENTIAL/PLATELET
Absolute Monocytes: 836 cells/uL (ref 200–950)
Basophils Absolute: 44 cells/uL (ref 0–200)
Basophils Relative: 0.4 %
Eosinophils Absolute: 110 cells/uL (ref 15–500)
Eosinophils Relative: 1 %
HCT: 38 % (ref 35.0–45.0)
Hemoglobin: 13 g/dL (ref 11.7–15.5)
Lymphs Abs: 2926 cells/uL (ref 850–3900)
MCH: 29 pg (ref 27.0–33.0)
MCHC: 34.2 g/dL (ref 32.0–36.0)
MCV: 84.6 fL (ref 80.0–100.0)
MPV: 11.2 fL (ref 7.5–12.5)
Monocytes Relative: 7.6 %
Neutro Abs: 7084 cells/uL (ref 1500–7800)
Neutrophils Relative %: 64.4 %
Platelets: 235 10*3/uL (ref 140–400)
RBC: 4.49 10*6/uL (ref 3.80–5.10)
RDW: 14.4 % (ref 11.0–15.0)
Total Lymphocyte: 26.6 %
WBC: 11 10*3/uL — ABNORMAL HIGH (ref 3.8–10.8)

## 2018-07-22 NOTE — Telephone Encounter (Signed)
-----   Message from Pollyann Savoy, MD sent at 07/22/2018 12:26 PM EST ----- Patient's creatinine has gone up.  Please advise her to discontinue methotrexate.  She should not take any NSAIDs.  We will recheck BMP in 1 month after stopping methotrexate.  If her creatinine goes back to normal we can try methotrexate again witho ut any NSAIDs.

## 2018-07-22 NOTE — Progress Notes (Signed)
Patient's creatinine has gone up.  Please advise her to discontinue methotrexate.  She should not take any NSAIDs.  We will recheck BMP in 1 month after stopping methotrexate.  If her creatinine goes back to normal we can try methotrexate again without any NSAIDs.

## 2018-07-24 ENCOUNTER — Other Ambulatory Visit: Payer: Self-pay | Admitting: Rheumatology

## 2018-07-25 NOTE — Telephone Encounter (Signed)
Patient has discontinued MTX currently.

## 2018-10-11 ENCOUNTER — Telehealth: Payer: Self-pay

## 2018-10-11 MED ORDER — SECUKINUMAB (300 MG DOSE) 150 MG/ML ~~LOC~~ SOAJ: 150.0000 mg | SUBCUTANEOUS | 0 refills | Status: DC

## 2018-10-11 NOTE — Telephone Encounter (Signed)
Received refill request via fax from [facility].   Last Visit: 06/30/2018 Next Visit: 12/01/2018 Labs: 07/21/2018 Patient's creatinine has gone up. Discontinued MTX TB Gold: 10/26/2017 negative   Okay to refill per Dr. Corliss Skains.

## 2018-10-20 DIAGNOSIS — F331 Major depressive disorder, recurrent, moderate: Secondary | ICD-10-CM | POA: Diagnosis not present

## 2018-10-20 DIAGNOSIS — F902 Attention-deficit hyperactivity disorder, combined type: Secondary | ICD-10-CM | POA: Diagnosis not present

## 2018-10-20 DIAGNOSIS — F422 Mixed obsessional thoughts and acts: Secondary | ICD-10-CM | POA: Diagnosis not present

## 2018-10-20 DIAGNOSIS — F411 Generalized anxiety disorder: Secondary | ICD-10-CM | POA: Diagnosis not present

## 2018-11-22 NOTE — Progress Notes (Signed)
Office Visit Note  Patient: Sarah Horton             Date of Birth: [birth year]           MRN: [id number]             PCP: Michele McalpineNadel, Scott M, MD Referring: Michele McalpineNadel, Scott M, MD Visit Date: 12/01/2018 Occupation: @GUAROCC @  Subjective:  Pain in both hands     History of Present Illness: Sarah BachCatherine M Tamm is a 68 y.o. female with history of psoriatic arthritis.  She is on Cosentyx 150 mg sq injections every 14 days.  She discontinued MTX in February 2020 due to elevated creatinine.  She has not noticed any increased joint pain or joint swelling since discontinuing methotrexate.  She has not had any recent flares.  She has noticed improvement in her joint pain since starting on Cymbalta.  She has been taking it as prescribed.  She reports he continues to have stiffness and pain in bilateral hands.  She has intermittent joint swelling in her hands.  She wears compression gloves at night and occasionally during the day which helps with gripping.  She is some difficulty opening jars and with some activities of daily living.  She is still able to type for work.  She reports that she has intermittent bilateral SI joint pain, worse in the left SI joint.  She experiences muscle spasms in her lower back occasionally.  She reports that she does have left great toe pain but denies any joint swelling.  She has noticed some scalp dryness, but she is unsure if it is psoriasis and she plans on following up with her dermatologist.   Activities of Daily Living:  Patient reports morning stiffness for 1-2 hours.   Patient Denies nocturnal pain.  Difficulty dressing/grooming: Denies Difficulty climbing stairs: Denies Difficulty getting out of chair: Denies Difficulty using hands for taps, buttons, cutlery, and/or writing: Reports  Review of Systems  Constitutional: Positive for fatigue.  HENT: Positive for mouth dryness. Negative for mouth sores and nose dryness.   Eyes: Negative for pain, visual  disturbance and dryness.  Respiratory: Negative for cough, hemoptysis, shortness of breath and difficulty breathing.   Cardiovascular: Negative for chest pain, palpitations, hypertension and swelling in legs/feet.  Gastrointestinal: Negative for blood in stool, constipation and diarrhea.  Endocrine: Negative for increased urination.  Genitourinary: Negative for painful urination.  Musculoskeletal: Positive for arthralgias, joint pain, joint swelling and morning stiffness. Negative for myalgias, muscle weakness, muscle tenderness and myalgias.  Skin: Positive for rash (Scalp psoriasis). Negative for color change, pallor, hair loss, nodules/bumps, skin tightness, ulcers and sensitivity to sunlight.  Allergic/Immunologic: Negative for susceptible to infections.  Neurological: Negative for dizziness, numbness, headaches and weakness.  Hematological: Negative for swollen glands.  Psychiatric/Behavioral: Negative for depressed mood and sleep disturbance. The patient is not nervous/anxious.     PMFS History:  Patient Active Problem List   Diagnosis Date Noted  . History of peripheral neuropathy 11/20/2016  . Left wrist tendonitis/ history of surgery for Dequervains  11/20/2016  . Raynaud's disease without gangrene 09/22/2016  . High risk medication use 09/22/2016  . History of ulcer of lower limb/ Venous stasis ulcer 01/2015  09/22/2016  . Osteopenia of multiple sites 09/22/2016  . Psoriasis 09/03/2016  . Chest wall pain 07/16/2016  . Flank pain, acute 05/15/2016  . Subacromial bursitis of right shoulder joint 04/10/2016  . Low back pain 04/09/2016  . Pre-ulcerative calluses 02/11/2016  . Varicose veins  of right lower extremity with complications 08/12/2015  . Elevated BP 08/09/2015  . Varicose veins of left lower extremity with complications 07/29/2015  . Varicose veins of bilateral lower extremities with other complications 07/09/2015  . Varicose veins of lower extremities with ulcer (HCC)  04/05/2015  . Status post bilateral knee replacements 12/13/2013  . Venous stasis ulcer of ankle (HCC) 06/06/2013  . ADD (attention deficit disorder) 04/04/2012  . INSOMNIA 06/20/2010  . Hypothyroidism 01/17/2010  . Obesity 01/17/2010  . Osteoarthritis 01/17/2010  . ANEMIA 10/02/2009  . SINUSITIS, ACUTE 10/02/2009  . Asthma 10/02/2009  . Venous (peripheral) insufficiency 03/11/2008  . Allergic rhinitis 03/11/2008  . BRONCHITIS, RECURRENT 03/11/2008  . History of migraine 03/11/2008  . Anxiety state 11/24/2007  . Hereditary and idiopathic peripheral neuropathy 11/24/2007  . GERD 11/24/2007  . IRRITABLE BOWEL SYNDROME 11/24/2007  . PSORIATIC ARTHROPATHY 11/24/2007    Past Medical History:  Diagnosis Date  . Allergic rhinitis, cause unspecified   . Allergy   . Anemia, unspecified   . Anxiety state, unspecified   . Arthritis   . Cataract    bilaterally removed 2003,2007  . Esophageal reflux   . Irritable bowel syndrome   . Migraine   . Obesity, unspecified   . Psoriatic arthropathy (HCC)   . Unspecified chronic bronchitis (HCC)   . Unspecified hereditary and idiopathic peripheral neuropathy   . Unspecified hypothyroidism   . Unspecified venous (peripheral) insufficiency     Family History  Problem Relation Age of Onset  . Cirrhosis Father   . COPD Mother   . Heart disease Mother   . Hypertension Mother   . Varicose Veins Mother   . Breast cancer Maternal Grandmother   . Breast cancer Other        1st cousin   . Pancreatic cancer Other        1st cousin   . Thyroid disease Sister   . Colon cancer Neg Hx   . Colon polyps Neg Hx   . Esophageal cancer Neg Hx   . Rectal cancer Neg Hx   . Stomach cancer Neg Hx    Past Surgical History:  Procedure Laterality Date  . ABDOMINAL HYSTERECTOMY    . ABDOMINAL HYSTERECTOMY    . ABLATION ON ENDOMETRIOSIS     x2  . Bilat TKRs  04/2010   by [name]  . BUNIONECTOMY  2009  . CATARACT EXTRACTION, BILATERAL     [id number]   . COLONOSCOPY  08-26-2004   with gessner tics and hems   . dequervains tensenovitis Left   . JOINT REPLACEMENT Bilateral    knees  . NASAL SINUS SURGERY    . SHOULDER SURGERY  2005  . TOTAL SHOULDER ARTHROPLASTY     Social History   Social History Narrative  . Not on file   Immunization History  Administered Date(s) Administered  . H1N1 05/18/2008  . Influenza Inj Mdck Quad Pf 04/01/2017  . Influenza Inj Mdck Quad With Preservative 04/13/2018  . Influenza Split 03/23/2011, 03/14/2012, 03/23/2013, 03/22/2016  . Influenza Whole 03/08/2008, 03/08/2009, 03/07/2010  . Influenza,inj,Quad PF,6+ Mos 03/21/2014  . Td 01/06/2002  . Tdap 03/14/2012     Objective: Vital Signs: BP (!) 142/94 (BP Location: Left Arm, Patient Position: Sitting, Cuff Size: Large)   Pulse 98   Resp 13   Ht 5' 8.5" (1.74 m)   Wt 249 lb (112.9 kg)   BMI 37.31 kg/m    Physical Exam Vitals signs and nursing note reviewed.  Constitutional:      Appearance: She is well-developed.  HENT:     Head: Normocephalic and atraumatic.  Eyes:     Conjunctiva/sclera: Conjunctivae normal.  Neck:     Musculoskeletal: Normal range of motion.  Cardiovascular:     Rate and Rhythm: Normal rate and regular rhythm.     Heart sounds: Normal heart sounds.  Pulmonary:     Effort: Pulmonary effort is normal.     Breath sounds: Normal breath sounds.  Abdominal:     General: Bowel sounds are normal.     Palpations: Abdomen is soft.  Lymphadenopathy:     Cervical: No cervical adenopathy.  Skin:    General: Skin is warm and dry.     Capillary Refill: Capillary refill takes less than 2 seconds.  Neurological:     Mental Status: She is alert and oriented to person, place, and time.  Psychiatric:        Behavior: Behavior normal.      Musculoskeletal Exam: C-spine, thoracic spine, lumbar spine good range of motion.  No midline spinal tenderness.  She has left SI joint tenderness on exam.  Shoulder joints have elbow  joints, wrist joints, MCPs, PIPs and DIPs good range of motion.  No tenderness or inflammation of elbow joints or wrist joints.  She has synovitis of the right first MCP joint.  Hip joints have good range of motion with no discomfort.  Bilateral knee replacements have good range of motion with no warmth or effusion.  She has no tenderness or swelling of ankle joints.  Calcification noted at the base of the right Achilles tendon.  CDAI Exam: CDAI Score: 2.8  Patient Global: 4 mm; Provider Global: 4 mm Swollen: 1 ; Tender: 2  Joint Exam      Right  Left  MCP 1  Swollen Tender     Sacroiliac      Tender     Investigation: No additional findings.  Imaging: No results found.  Recent Labs: Lab Results  Component Value Date   WBC 11.0 (H) 07/21/2018   HGB 13.0 07/21/2018   PLT 235 07/21/2018   NA 140 07/21/2018   K 4.7 07/21/2018   CL 106 07/21/2018   CO2 25 07/21/2018   GLUCOSE 84 07/21/2018   BUN 21 07/21/2018   CREATININE 1.32 (H) 07/21/2018   BILITOT 0.6 07/21/2018   ALKPHOS 87 01/25/2017   AST 19 07/21/2018   ALT 21 07/21/2018   PROT 6.8 07/21/2018   ALBUMIN 4.1 01/25/2017   CALCIUM 9.3 07/21/2018   GFRAA 49 (L) 07/21/2018   QFTBGOLDPLUS NEGATIVE 10/26/2017    Speciality Comments: Prior therapy: Methotrexate (elevated creatinine)  Procedures:  No procedures performed Allergies: Nucynta [tapentadol], Codeine, and Penicillins   Assessment / Plan:     Visit Diagnoses: Psoriatic arthropathy (Jette) -She has synovitis and tenderness of the right first MCP joint and left SI joint on exam.  She continues to have pain and stiffness in both hands and reports intermittent joint swelling.  We discussed scheduling an ultrasound-guided cortisone injection of the right first MCP joint.  She will notify us if the joint pain and joint swelling persist.  She continues to inject Cosentyx 150 mg subcutaneously every 14 days.  She has not missed any doses recently.  She discontinued  methotrexate in February 2020 due to elevated creatinine.  She has not noticed any increased joint pain or joint swelling since discontinuing methotrexate.  She will continue on Cosentyx 150 mg subcutaneous  injections every 14 days as monotherapy.  We will check lab work today.  She is advised to notify us if she develops increased joint pain or joint swelling.  She will follow-up in the office in 5 months.  Psoriasis -She has psoriasis on her scalp.  She will be following up her dermatologist.   High risk medication use -Cosentyx 150 mg every 14 days (started on 11/16/17).  Methotrexate discontinued in February due to elevated creatinine.  Last TB gold negative on 10/26/2017.  Due for TB gold today and will monitor yearly.  Most recent CBC/CMP within normal limits except for elevated WBC count and elevated creatinine on 07/21/2018.  She was to return in 1 month for repeat BMP.  Due for CBC/CMP today and will monitor every 3 months.  Standing orders are in place.  She received the flu vaccine in November.  (Inadequate response to Enbrel and [place]).  She was advised to hold Cosentyx if she develops any signs or symptoms of an infection.  We discussed the importance of social distancing and following a standard precautions recommended by the CDC.- Plan: CBC with Differential/Platelet, COMPLETE METABOLIC PANEL WITH GFR, QuantiFERON-TB Gold Plus  Sacroiliitis (HCC) - She has left SI joint tenderness on exam.   Status post bilateral knee replacements - Doing well.  She has no warmth or effusion.  She has good range of motion.  She was encouraged to stay active and exercise on a regular basis.  Raynaud's disease without gangrene - She has intermittent symptoms of raynaud's.  No digital ulcerations or signs of gangrene noted.   Other medical conditions are listed as follows:   History of ulcer of lower limb/ Venous stasis ulcer 01/2015    Left wrist tendonitis/ history of surgery for Dequervains   Osteopenia  of multiple sites   Primary insomnia   History of hypertension  History of gastroesophageal reflux (GERD)   History of peripheral neuropathy   History of IBS   History of migraine   History of asthma   History of hypothyroidism   Orders: Orders Placed This Encounter  Procedures  . CBC with Differential/Platelet  . COMPLETE METABOLIC PANEL WITH GFR  . QuantiFERON-TB Gold Plus   No orders of the defined types were placed in this encounter.   Face-to-face time spent with patient was 30 minutes. Greater than 50% of time was spent in counseling and coordination of care.  Follow-Up Instructions: Return in about 5 months (around 05/03/2019) for Psoriatic arthritis.   Gearldine Bienenstockaylor M Roniqua Kintz, PA-C  Note - This record has been created using Dragon software.  Chart creation errors have been sought, but may not always  have been located. Such creation errors do not reflect on  the standard of medical care.

## 2018-12-01 ENCOUNTER — Encounter: Payer: Self-pay | Admitting: Physician Assistant

## 2018-12-01 ENCOUNTER — Other Ambulatory Visit: Payer: Self-pay

## 2018-12-01 ENCOUNTER — Ambulatory Visit (INDEPENDENT_AMBULATORY_CARE_PROVIDER_SITE_OTHER): Payer: BC Managed Care – PPO | Admitting: Physician Assistant

## 2018-12-01 VITALS — BP 142/94 | HR 98 | Resp 13 | Ht 68.5 in | Wt 249.0 lb

## 2018-12-01 DIAGNOSIS — M8589 Other specified disorders of bone density and structure, multiple sites: Secondary | ICD-10-CM

## 2018-12-01 DIAGNOSIS — Z8709 Personal history of other diseases of the respiratory system: Secondary | ICD-10-CM

## 2018-12-01 DIAGNOSIS — Z8669 Personal history of other diseases of the nervous system and sense organs: Secondary | ICD-10-CM

## 2018-12-01 DIAGNOSIS — F5101 Primary insomnia: Secondary | ICD-10-CM

## 2018-12-01 DIAGNOSIS — L4050 Arthropathic psoriasis, unspecified: Secondary | ICD-10-CM | POA: Diagnosis not present

## 2018-12-01 DIAGNOSIS — Z872 Personal history of diseases of the skin and subcutaneous tissue: Secondary | ICD-10-CM

## 2018-12-01 DIAGNOSIS — M461 Sacroiliitis, not elsewhere classified: Secondary | ICD-10-CM | POA: Diagnosis not present

## 2018-12-01 DIAGNOSIS — Z79899 Other long term (current) drug therapy: Secondary | ICD-10-CM | POA: Diagnosis not present

## 2018-12-01 DIAGNOSIS — L409 Psoriasis, unspecified: Secondary | ICD-10-CM

## 2018-12-01 DIAGNOSIS — I7300 Raynaud's syndrome without gangrene: Secondary | ICD-10-CM

## 2018-12-01 DIAGNOSIS — M778 Other enthesopathies, not elsewhere classified: Secondary | ICD-10-CM

## 2018-12-01 DIAGNOSIS — Z8679 Personal history of other diseases of the circulatory system: Secondary | ICD-10-CM

## 2018-12-01 DIAGNOSIS — Z8719 Personal history of other diseases of the digestive system: Secondary | ICD-10-CM

## 2018-12-01 DIAGNOSIS — Z8639 Personal history of other endocrine, nutritional and metabolic disease: Secondary | ICD-10-CM

## 2018-12-01 DIAGNOSIS — Z96653 Presence of artificial knee joint, bilateral: Secondary | ICD-10-CM

## 2018-12-02 NOTE — Progress Notes (Signed)
CBC WNL.  Creatinine is borderline elevated but improving. GFR is 58.  Rest of CMP WNL.

## 2018-12-03 LAB — CBC WITH DIFFERENTIAL/PLATELET
Absolute Monocytes: 733 cells/uL (ref 200–950)
Basophils Absolute: 37 cells/uL (ref 0–200)
Basophils Relative: 0.5 %
Eosinophils Absolute: 118 cells/uL (ref 15–500)
Eosinophils Relative: 1.6 %
HCT: 39.3 % (ref 35.0–45.0)
Hemoglobin: 13.1 g/dL (ref 11.7–15.5)
Lymphs Abs: 2153 cells/uL (ref 850–3900)
MCH: 27.8 pg (ref 27.0–33.0)
MCHC: 33.3 g/dL (ref 32.0–36.0)
MCV: 83.3 fL (ref 80.0–100.0)
MPV: 10.9 fL (ref 7.5–12.5)
Monocytes Relative: 9.9 %
Neutro Abs: 4359 cells/uL (ref 1500–7800)
Neutrophils Relative %: 58.9 %
Platelets: 205 10*3/uL (ref 140–400)
RBC: 4.72 10*6/uL (ref 3.80–5.10)
RDW: 13.2 % (ref 11.0–15.0)
Total Lymphocyte: 29.1 %
WBC: 7.4 10*3/uL (ref 3.8–10.8)

## 2018-12-03 LAB — COMPLETE METABOLIC PANEL WITH GFR
AG Ratio: 1.6 (calc) (ref 1.0–2.5)
ALT: 19 U/L (ref 6–29)
AST: 19 U/L (ref 10–35)
Albumin: 4.1 g/dL (ref 3.6–5.1)
Alkaline phosphatase (APISO): 81 U/L (ref 37–153)
BUN/Creatinine Ratio: 14 (calc) (ref 6–22)
BUN: 14 mg/dL (ref 7–25)
CO2: 24 mmol/L (ref 20–32)
Calcium: 9.5 mg/dL (ref 8.6–10.4)
Chloride: 106 mmol/L (ref 98–110)
Creat: 1.02 mg/dL — ABNORMAL HIGH (ref 0.50–0.99)
GFR, Est African American: 67 mL/min/{1.73_m2} (ref >=60)
GFR, Est Non African American: 58 mL/min/{1.73_m2} — ABNORMAL LOW (ref >=60)
Globulin: 2.6 g/dL (calc) (ref 1.9–3.7)
Glucose, Bld: 101 mg/dL — ABNORMAL HIGH (ref 65–99)
Potassium: 4.3 mmol/L (ref 3.5–5.3)
Sodium: 140 mmol/L (ref 135–146)
Total Bilirubin: 0.4 mg/dL (ref 0.2–1.2)
Total Protein: 6.7 g/dL (ref 6.1–8.1)

## 2018-12-03 LAB — QUANTIFERON-TB GOLD PLUS
Mitogen-NIL: >10 IU/mL
NIL: 0.01 IU/mL
QuantiFERON-TB Gold Plus: NEGATIVE
TB1-NIL: 0 IU/mL
TB2-NIL: 0.01 IU/mL

## 2018-12-05 NOTE — Progress Notes (Signed)
TB gold negative

## 2019-01-03 ENCOUNTER — Other Ambulatory Visit: Payer: Self-pay | Admitting: *Deleted

## 2019-01-03 MED ORDER — COSENTYX SENSOREADY (300 MG) 150 MG/ML ~~LOC~~ SOAJ: 150.0000 mg | SUBCUTANEOUS | 0 refills | Status: DC

## 2019-01-03 NOTE — Telephone Encounter (Signed)
Refill request received via fax  Last Visit: 12/01/18 Next Visit: 04/24/19 Labs: 12/01/18 CBC WNL. Creatinine is borderline elevated but improving. GFR is 58. Rest of CMP WNL. Tb Gold: 12/01/18 Neg   Okay to refill per Dr. Estanislado Pandy

## 2019-01-30 DIAGNOSIS — F411 Generalized anxiety disorder: Secondary | ICD-10-CM | POA: Diagnosis not present

## 2019-01-30 DIAGNOSIS — F331 Major depressive disorder, recurrent, moderate: Secondary | ICD-10-CM | POA: Diagnosis not present

## 2019-01-30 DIAGNOSIS — F902 Attention-deficit hyperactivity disorder, combined type: Secondary | ICD-10-CM | POA: Diagnosis not present

## 2019-01-30 DIAGNOSIS — F422 Mixed obsessional thoughts and acts: Secondary | ICD-10-CM | POA: Diagnosis not present

## 2019-03-31 ENCOUNTER — Encounter: Payer: Self-pay | Admitting: Internal Medicine

## 2019-03-31 ENCOUNTER — Other Ambulatory Visit: Payer: Self-pay

## 2019-03-31 ENCOUNTER — Ambulatory Visit (INDEPENDENT_AMBULATORY_CARE_PROVIDER_SITE_OTHER): Payer: BC Managed Care – PPO | Admitting: Internal Medicine

## 2019-03-31 VITALS — BP 120/80 | HR 88 | Temp 98.3°F

## 2019-03-31 DIAGNOSIS — Z23 Encounter for immunization: Secondary | ICD-10-CM | POA: Diagnosis not present

## 2019-03-31 NOTE — Progress Notes (Deleted)
Flu vaccine by CMA 

## 2019-03-31 NOTE — Progress Notes (Signed)
   Subjective:    Patient ID: Sarah Horton, female    DOB: [birth year], 68 y.o.   MRN: [id number]  HPI Flu vaccine given by CMA    Review of Systems     Objective:   Physical Exam        Assessment & Plan:

## 2019-03-31 NOTE — Patient Instructions (Signed)
Flu vaccine by CMA 

## 2019-04-09 ENCOUNTER — Other Ambulatory Visit: Payer: Self-pay | Admitting: Rheumatology

## 2019-04-10 NOTE — Progress Notes (Signed)
Office Visit Note  Patient: Sarah Horton             Date of Birth: [birth year]           MRN: [id number]             PCP: Michele Mcalpine, MD Referring: Michele Mcalpine, MD Visit Date: 04/24/2019 Occupation: @  Subjective:  Pain in both hands and feet.    History of Present Illness: Sarah Horton is a 68 y.o. female with history of psoriatic arthritis and psoriasis.  She states she has been having increased pain and swelling in her bilateral hands.  She is also experiencing discomfort in her feet.  She believes she was doing much better on combination therapy with methotrexate and Cosentyx.  She states she is in constant discomfort.  She is also having difficulty walking due to feet discomfort.  None of the other joints are as painful.  She denies any psoriasis flare.  Activities of Daily Living:  Patient reports morning stiffness for 2 hours.   Patient Denies nocturnal pain.  Difficulty dressing/grooming: Reports Difficulty climbing stairs: Reports Difficulty getting out of chair: Denies Difficulty using hands for taps, buttons, cutlery, and/or writing: Reports  Review of Systems  Constitutional: Positive for fatigue. Negative for night sweats, weight gain and weight loss.  HENT: Positive for mouth dryness and nose dryness. Negative for mouth sores, trouble swallowing and trouble swallowing.   Eyes: Negative for pain, redness, visual disturbance and dryness.  Respiratory: Negative for cough, shortness of breath and difficulty breathing.   Cardiovascular: Negative for chest pain, palpitations, hypertension, irregular heartbeat and swelling in legs/feet.  Gastrointestinal: Negative for blood in stool, constipation and diarrhea.  Endocrine: Negative for increased urination.  Genitourinary: Negative for difficulty urinating and vaginal dryness.  Musculoskeletal: Positive for arthralgias, joint pain, joint swelling and morning stiffness. Negative for myalgias,  muscle weakness, muscle tenderness and myalgias.  Skin: Positive for hair loss. Negative for color change, rash, skin tightness, ulcers and sensitivity to sunlight.  Allergic/Immunologic: Negative for susceptible to infections.  Neurological: Positive for weakness. Negative for dizziness, numbness, headaches, memory loss and night sweats.  Hematological: Negative for bruising/bleeding tendency and swollen glands.  Psychiatric/Behavioral: Negative for depressed mood, confusion and sleep disturbance. The patient is not nervous/anxious.     PMFS History:  Patient Active Problem List   Diagnosis Date Noted  . History of peripheral neuropathy 11/20/2016  . Left wrist tendonitis/ history of surgery for Dequervains  11/20/2016  . Raynaud's disease without gangrene 09/22/2016  . High risk medication use 09/22/2016  . History of ulcer of lower limb/ Venous stasis ulcer 01/2015  09/22/2016  . Osteopenia of multiple sites 09/22/2016  . Psoriasis 09/03/2016  . Chest wall pain 07/16/2016  . Flank pain, acute 05/15/2016  . Subacromial bursitis of right shoulder joint 04/10/2016  . Low back pain 04/09/2016  . Pre-ulcerative calluses 02/11/2016  . Varicose veins of right lower extremity with complications 08/12/2015  . Elevated BP 08/09/2015  . Varicose veins of left lower extremity with complications 07/29/2015  . Varicose veins of bilateral lower extremities with other complications 07/09/2015  . Varicose veins of lower extremities with ulcer (HCC) 04/05/2015  . Status post bilateral knee replacements 12/13/2013  . Venous stasis ulcer of ankle (HCC) 06/06/2013  . ADD (attention deficit disorder) 04/04/2012  . INSOMNIA 06/20/2010  . Hypothyroidism 01/17/2010  . Obesity 01/17/2010  . Osteoarthritis 01/17/2010  . ANEMIA 10/02/2009  . SINUSITIS, ACUTE 10/02/2009  .  Asthma 10/02/2009  . Venous (peripheral) insufficiency 03/11/2008  . Allergic rhinitis 03/11/2008  . BRONCHITIS, RECURRENT  03/11/2008  . History of migraine 03/11/2008  . Anxiety state 11/24/2007  . Hereditary and idiopathic peripheral neuropathy 11/24/2007  . GERD 11/24/2007  . IRRITABLE BOWEL SYNDROME 11/24/2007  . PSORIATIC ARTHROPATHY 11/24/2007    Past Medical History:  Diagnosis Date  . Allergic rhinitis, cause unspecified   . Allergy   . Anemia, unspecified   . Anxiety state, unspecified   . Arthritis   . Cataract    bilaterally removed 2003,2007  . Esophageal reflux   . Irritable bowel syndrome   . Migraine   . Obesity, unspecified   . Psoriatic arthropathy (HCC)   . Unspecified chronic bronchitis (HCC)   . Unspecified hereditary and idiopathic peripheral neuropathy   . Unspecified hypothyroidism   . Unspecified venous (peripheral) insufficiency     Family History  Problem Relation Age of Onset  . Cirrhosis Father   . COPD Mother   . Heart disease Mother   . Hypertension Mother   . Varicose Veins Mother   . Breast cancer Maternal Grandmother   . Breast cancer Other        1st cousin   . Pancreatic cancer Other        1st cousin   . Thyroid disease Sister   . Colon cancer Neg Hx   . Colon polyps Neg Hx   . Esophageal cancer Neg Hx   . Rectal cancer Neg Hx   . Stomach cancer Neg Hx    Past Surgical History:  Procedure Laterality Date  . ABDOMINAL HYSTERECTOMY    . ABDOMINAL HYSTERECTOMY    . ABLATION ON ENDOMETRIOSIS     x2  . Bilat TKRs  04/2010   by [name]  . BUNIONECTOMY  2009  . CATARACT EXTRACTION, BILATERAL     [id number]  . COLONOSCOPY  08-26-2004   with gessner tics and hems   . dequervains tensenovitis Left   . JOINT REPLACEMENT Bilateral    knees  . NASAL SINUS SURGERY    . SHOULDER SURGERY  2005  . TOTAL SHOULDER ARTHROPLASTY     Social History   Social History Narrative  . Not on file   Immunization History  Administered Date(s) Administered  . H1N1 05/18/2008  . Influenza Inj Mdck Quad Pf 04/01/2017  . Influenza Inj Mdck Quad With Preservative  04/13/2018  . Influenza Split 03/23/2011, 03/14/2012, 03/23/2013, 03/22/2016  . Influenza Whole 03/08/2008, 03/08/2009, 03/07/2010  . Influenza,inj,Quad PF,6+ Mos 03/21/2014, 03/31/2019  . Td 01/06/2002  . Tdap 03/14/2012     Objective: Vital Signs: BP 125/86 (BP Location: Left Arm, Patient Position: Sitting, Cuff Size: Large)   Pulse 96   Resp 15   Ht 5\' 8"  (1.727 m)   Wt 247 lb 3.2 oz (112.1 kg)   BMI 37.59 kg/m    Physical Exam Vitals signs and nursing note reviewed.  Constitutional:      Appearance: She is well-developed.  HENT:     Head: Normocephalic and atraumatic.  Eyes:     Conjunctiva/sclera: Conjunctivae normal.  Neck:     Musculoskeletal: Normal range of motion.  Cardiovascular:     Rate and Rhythm: Normal rate and regular rhythm.     Heart sounds: Normal heart sounds.  Pulmonary:     Effort: Pulmonary effort is normal.     Breath sounds: Normal breath sounds.  Abdominal:     General: Bowel sounds are normal.  Palpations: Abdomen is soft.  Lymphadenopathy:     Cervical: No cervical adenopathy.  Skin:    General: Skin is warm and dry.     Capillary Refill: Capillary refill takes less than 2 seconds.  Neurological:     Mental Status: She is alert and oriented to person, place, and time.  Psychiatric:        Behavior: Behavior normal.      Musculoskeletal Exam: C-spine was in good range of motion.  Shoulder joints, elbow joints were in good range of motion.  She had synovitis over some of the MCPs and PIPs as described below.  She has tenderness in her bilateral feet.  Knee joints and ankle joints were in good range of motion.  CDAI Exam: CDAI Score: 7.6  Patient Global: 8 mm; Provider Global: 8 mm Swollen: 3 ; Tender: 7  Joint Exam      Right  Left  MCP 1  Swollen Tender     PIP 2  Swollen Tender     PIP 4     Swollen Tender  MTP 3      Tender  MTP 5   Tender     PIP 3 (toe)      Tender  PIP 4 (toe)      Tender     Investigation: No  additional findings.  Imaging: Xr Foot 2 Views Left  Result Date: 04/24/2019 First MTP, PIP and DIP narrowing was noted.  Postsurgical changes were noted with a pin present in the first metatarsal.  Surgical resection of the fifth proximal phalanx was noted.  No intertarsal or tibiotalar joint space narrowing was noted.  No erosive changes were noted. Impression: These findings are consistent with osteoarthritis and psoriatic arthritis overlap.  Xr Foot 2 Views Right  Result Date: 04/24/2019 First MTP narrowing and postsurgical changes were noted.  Screws were noted in second and fifth metatarsal heads.  None of the other MTPs showed narrowing or erosive changes.  PIP and DIP narrowing was noted.  No intertarsal or tibiotalar joint space narrowing was noted.  Small posterior calcaneal spur was noted. Impression: These findings are consistent with psoriatic arthritis.  Xr Hand 2 View Left  Result Date: 04/24/2019 Juxta-articular osteopenia was noted.  PIP and DIP narrowing was noted.  An erosion was noted over third DIP joint.  No intercarpal radiocarpal joint space narrowing was noted.  Possible erosion was noted in carpal bones. Impression: These findings are consistent with psoriatic arthritis.  No comparison films are available.  Xr Hand 2 View Right  Result Date: 04/24/2019 Juxta-articular osteopenia was noted.  PIP and DIP narrowing was noted.  First MCP narrowing and soft tissue swelling was noted.  Soft tissue swelling of second PIP joint was noted.  No intercarpal or radiocarpal joint space narrowing was noted.  No erosive changes were noted. Impression: These findings are consistent with psoriatic arthritis.   Recent Labs: Lab Results  Component Value Date   WBC 6.8 04/14/2019   HGB 12.7 04/14/2019   PLT 202 04/14/2019   NA 141 04/14/2019   K 4.2 04/14/2019   CL 108 04/14/2019   CO2 27 04/14/2019   GLUCOSE 87 04/14/2019   BUN 13 04/14/2019   CREATININE 1.18 (H) 04/14/2019    BILITOT 0.6 04/14/2019   ALKPHOS 87 01/25/2017   AST 21 04/14/2019   ALT 18 04/14/2019   PROT 7.1 04/14/2019   ALBUMIN 4.1 01/25/2017   CALCIUM 9.3 04/14/2019   GFRAA 56 (L)  04/14/2019   QFTBGOLDPLUS NEGATIVE 12/01/2018    Speciality Comments: Prior therapy: Methotrexate (elevated creatinine)  Procedures:  No procedures performed Allergies: Nucynta [tapentadol], Codeine, and Penicillins   Assessment / Plan:     Visit Diagnoses: Psoriatic arthropathy ([place]) -patient has been having flare of psoriatic arthritis with increased pain and swelling in multiple joints.  She is on Cosentyx every 14 days.  She has been having more flares since she has been off methotrexate.  Methotrexate was discontinued due to elevated creatinine.  There are different treatment options and their side effects were discussed.  Handout on review of the skin.  The plan is to start her on Arava 10 mg p.o. daily for 2 weeks and if labs are stable then we can increase Arava to 20 mg p.o. daily.  We will check labs 2 weeks x 2, 2 months and then every 3 months.  I would like to see the response to the combination therapy.  Plan: leflunomide (ARAVA) 10 MG tablet  Psoriasis-she has no active psoriasis lesions.  Pain in both hands -she has been experiencing increased pain in her hands.  Plan: XR Hand 2 View Right, XR Hand 2 View Left.  The x-rays today showed soft tissue swelling and some erosive changes in the left hand.  Pain in both feet - Plan: XR Foot 2 Views Right, XR Foot 2 Views Left.  The x-ray showed postsurgical changes no erosive changes were noted.  Mostly osteoarthritic changes were noted.  High risk medication use -Cosentyx 150 mg every 14 days (started on November 16, 2017). Last TB gold negative on 12/01/2018 and will monitor yearly.  Most recent CBC/CMP within normal limits except for elevated creatinine and decreased GFR but stable on 04/14/2019.Methotrexate discontinued in February due to elevated creatinine.     Sacroiliitis (HCC)-she has intermittent discomfort.  Status post bilateral knee replacements-doing well.  Raynaud's disease without gangrene-warm clothing was discussed.  History of ulcer of lower limb/ Venous stasis ulcer 01/2015   Left wrist tendonitis/ history of surgery for Dequervains   Osteopenia of multiple sites-she is on calcium and vitamin D.  Other medical problems are listed as follows:  Primary insomnia  History of hypertension  History of gastroesophageal reflux (GERD)  History of peripheral neuropathy  History of IBS  History of migraine  History of asthma  History of hypothyroidism  Orders: Orders Placed This Encounter  Procedures  . XR Hand 2 View Right  . XR Hand 2 View Left  . XR Foot 2 Views Right  . XR Foot 2 Views Left   No orders of the defined types were placed in this encounter.     Follow-Up Instructions: Return in about 4 weeks (around 05/22/2019) for Psoriatic arthritis, Osteoarthritis.   Bo Merino, MD  Note - This record has been created using Editor, commissioning.  Chart creation errors have been sought, but may not always  have been located. Such creation errors do not reflect on  the standard of medical care.

## 2019-04-10 NOTE — Telephone Encounter (Signed)
Ok to refill 30 day supply.  We can update lab work at her upcoming office visit on 04/24/19.

## 2019-04-10 NOTE — Telephone Encounter (Addendum)
Last Visit: 12/01/18 Next Visit: 04/24/19 Labs: 12/01/18 CBC WNL. Creatinine is borderline elevated but improving. GFR is 58. Rest of CMP WNL. Tb Gold: 12/01/18 Neg   Left message to advise patient she is due to update labs.   Okay to refill 30 day supply Cosentyx?

## 2019-04-11 ENCOUNTER — Other Ambulatory Visit: Payer: Self-pay | Admitting: Rheumatology

## 2019-04-11 NOTE — Telephone Encounter (Signed)
Last Visit: 12/01/18 Next Visit: 04/24/19 Labs: 12/01/18 CBC WNL. Creatinine is borderline elevated but improving. GFR is 58. Rest of CMP WNL. Tb Gold: 12/01/18 Neg

## 2019-04-14 ENCOUNTER — Other Ambulatory Visit: Payer: Self-pay

## 2019-04-14 DIAGNOSIS — Z79899 Other long term (current) drug therapy: Secondary | ICD-10-CM | POA: Diagnosis not present

## 2019-04-15 LAB — COMPLETE METABOLIC PANEL WITH GFR
AG Ratio: 1.4 (calc) (ref 1.0–2.5)
ALT: 18 U/L (ref 6–29)
AST: 21 U/L (ref 10–35)
Albumin: 4.1 g/dL (ref 3.6–5.1)
Alkaline phosphatase (APISO): 73 U/L (ref 37–153)
BUN/Creatinine Ratio: 11 (calc) (ref 6–22)
BUN: 13 mg/dL (ref 7–25)
CO2: 27 mmol/L (ref 20–32)
Calcium: 9.3 mg/dL (ref 8.6–10.4)
Chloride: 108 mmol/L (ref 98–110)
Creat: 1.18 mg/dL — ABNORMAL HIGH (ref 0.50–0.99)
GFR, Est African American: 56 mL/min/{1.73_m2} — ABNORMAL LOW (ref >=60)
GFR, Est Non African American: 48 mL/min/{1.73_m2} — ABNORMAL LOW (ref >=60)
Globulin: 3 g/dL (calc) (ref 1.9–3.7)
Glucose, Bld: 87 mg/dL (ref 65–99)
Potassium: 4.2 mmol/L (ref 3.5–5.3)
Sodium: 141 mmol/L (ref 135–146)
Total Bilirubin: 0.6 mg/dL (ref 0.2–1.2)
Total Protein: 7.1 g/dL (ref 6.1–8.1)

## 2019-04-15 LAB — CBC WITH DIFFERENTIAL/PLATELET
Absolute Monocytes: 741 cells/uL (ref 200–950)
Basophils Absolute: 48 cells/uL (ref 0–200)
Basophils Relative: 0.7 %
Eosinophils Absolute: 150 cells/uL (ref 15–500)
Eosinophils Relative: 2.2 %
HCT: 38 % (ref 35.0–45.0)
Hemoglobin: 12.7 g/dL (ref 11.7–15.5)
Lymphs Abs: 2244 cells/uL (ref 850–3900)
MCH: 28.6 pg (ref 27.0–33.0)
MCHC: 33.4 g/dL (ref 32.0–36.0)
MCV: 85.6 fL (ref 80.0–100.0)
MPV: 11.1 fL (ref 7.5–12.5)
Monocytes Relative: 10.9 %
Neutro Abs: 3618 cells/uL (ref 1500–7800)
Neutrophils Relative %: 53.2 %
Platelets: 202 10*3/uL (ref 140–400)
RBC: 4.44 10*6/uL (ref 3.80–5.10)
RDW: 14.2 % (ref 11.0–15.0)
Total Lymphocyte: 33 %
WBC: 6.8 10*3/uL (ref 3.8–10.8)

## 2019-04-16 NOTE — Progress Notes (Signed)
Labs are stable.

## 2019-04-24 ENCOUNTER — Ambulatory Visit: Payer: Self-pay

## 2019-04-24 ENCOUNTER — Ambulatory Visit (INDEPENDENT_AMBULATORY_CARE_PROVIDER_SITE_OTHER): Payer: BC Managed Care – PPO | Admitting: Rheumatology

## 2019-04-24 ENCOUNTER — Encounter: Payer: Self-pay | Admitting: Physician Assistant

## 2019-04-24 ENCOUNTER — Other Ambulatory Visit: Payer: Self-pay

## 2019-04-24 VITALS — BP 125/86 | HR 96 | Resp 15 | Ht 68.0 in | Wt 247.2 lb

## 2019-04-24 DIAGNOSIS — M79672 Pain in left foot: Secondary | ICD-10-CM

## 2019-04-24 DIAGNOSIS — L409 Psoriasis, unspecified: Secondary | ICD-10-CM

## 2019-04-24 DIAGNOSIS — M79641 Pain in right hand: Secondary | ICD-10-CM | POA: Diagnosis not present

## 2019-04-24 DIAGNOSIS — Z8709 Personal history of other diseases of the respiratory system: Secondary | ICD-10-CM

## 2019-04-24 DIAGNOSIS — M79642 Pain in left hand: Secondary | ICD-10-CM

## 2019-04-24 DIAGNOSIS — Z8679 Personal history of other diseases of the circulatory system: Secondary | ICD-10-CM

## 2019-04-24 DIAGNOSIS — M778 Other enthesopathies, not elsewhere classified: Secondary | ICD-10-CM

## 2019-04-24 DIAGNOSIS — Z872 Personal history of diseases of the skin and subcutaneous tissue: Secondary | ICD-10-CM

## 2019-04-24 DIAGNOSIS — Z8669 Personal history of other diseases of the nervous system and sense organs: Secondary | ICD-10-CM

## 2019-04-24 DIAGNOSIS — M461 Sacroiliitis, not elsewhere classified: Secondary | ICD-10-CM

## 2019-04-24 DIAGNOSIS — M79671 Pain in right foot: Secondary | ICD-10-CM

## 2019-04-24 DIAGNOSIS — Z8639 Personal history of other endocrine, nutritional and metabolic disease: Secondary | ICD-10-CM

## 2019-04-24 DIAGNOSIS — Z96653 Presence of artificial knee joint, bilateral: Secondary | ICD-10-CM

## 2019-04-24 DIAGNOSIS — Z79899 Other long term (current) drug therapy: Secondary | ICD-10-CM

## 2019-04-24 DIAGNOSIS — Z8719 Personal history of other diseases of the digestive system: Secondary | ICD-10-CM

## 2019-04-24 DIAGNOSIS — F5101 Primary insomnia: Secondary | ICD-10-CM

## 2019-04-24 DIAGNOSIS — L4050 Arthropathic psoriasis, unspecified: Secondary | ICD-10-CM

## 2019-04-24 DIAGNOSIS — I7300 Raynaud's syndrome without gangrene: Secondary | ICD-10-CM

## 2019-04-24 DIAGNOSIS — M8589 Other specified disorders of bone density and structure, multiple sites: Secondary | ICD-10-CM

## 2019-04-24 MED ORDER — LEFLUNOMIDE 10 MG PO TABS: ORAL_TABLET | ORAL | 0 refills | Status: DC

## 2019-04-24 NOTE — Patient Instructions (Addendum)
Standing Labs We placed an order today for your standing lab work.    Please come back and get your standing labs in 2 weeks, 4 weeks, 8 weeks, then every 3 months.  We have open lab daily Monday through Thursday from 8:30-12:30 PM and 1:30-4:30 PM and Friday from 8:30-12:30 PM and 1:30-4:00 PM at the office of Dr. Timiko Offutt.   You may experience shorter wait times on Monday and Friday afternoons. The office is located at [address], [address], [place], [place] [address] No appointment is necessary.   Labs are drawn by [facility].  You may receive a bill from [facility] for your lab work.  If you wish to have your labs drawn at another location, please call the office 24 hours in advance to send orders.  If you have any questions regarding directions or hours of operation,  please call [phone].   Just as a reminder please drink plenty of water prior to coming for your lab work. Thanks!  Leflunomide tablets What is this medicine? LEFLUNOMIDE (le FLOO na mide) is for rheumatoid arthritis. This medicine may be used for other purposes; ask your health care provider or pharmacist if you have questions. COMMON BRAND NAME(S): Arava What should I tell my health care provider before I take this medicine? They need to know if you have any of these conditions:  diabetes  have a fever or infection  high blood pressure  immune system problems  kidney disease  liver disease  low blood cell counts, like low white cell, platelet, or red cell counts  lung or breathing disease, like asthma  recently received or scheduled to receive a vaccine  receiving treatment for cancer  skin conditions or sensitivity  tingling of the fingers or toes, or other nerve disorder  tuberculosis  an unusual or allergic reaction to leflunomide, teriflunomide, other medicines, food, dyes, or preservatives  pregnant or trying to get pregnant  breast-feeding How should I use this  medicine? Take this medicine by mouth with a full glass of water. Follow the directions on the prescription label. Take your medicine at regular intervals. Do not take your medicine more often than directed. Do not stop taking except on your doctor's advice. Talk to your pediatrician regarding the use of this medicine in children. Special care may be needed. Overdosage: If you think you have taken too much of this medicine contact a poison control center or emergency room at once. NOTE: This medicine is only for you. Do not share this medicine with others. What if I miss a dose? If you miss a dose, take it as soon as you can. If it is almost time for your next dose, take only that dose. Do not take double or extra doses. What may interact with this medicine? Do not take this medicine with any of the following medications:  teriflunomide This medicine may also interact with the following medications:  alosetron  birth control pills  caffeine  cefaclor  certain medicines for diabetes like nateglinide, repaglinide, rosiglitazone, pioglitazone  certain medicines for high cholesterol like atorvastatin, pravastatin, rosuvastatin, simvastatin  charcoal  cholestyramine  ciprofloxacin  duloxetine  furosemide  ketoprofen  live virus vaccines  medicines that increase your risk for infection  methotrexate  mitoxantrone  paclitaxel  penicillin  theophylline  tizanidine  warfarin This list may not describe all possible interactions. Give your health care provider a list of all the medicines, herbs, non-prescription drugs, or dietary supplements you use. Also tell them   if you smoke, drink alcohol, or use illegal drugs. Some items may interact with your medicine. What should I watch for while using this medicine? Visit your health care provider for regular checks on your progress. Tell your doctor or health care provider if your symptoms do not start to get better or if they  get worse. You may need blood work done while you are taking this medicine. This medicine may cause serious skin reactions. They can happen weeks to months after starting the medicine. Contact your health care provider right away if you notice fevers or flu-like symptoms with a rash. The rash may be red or purple and then turn into blisters or peeling of the skin. Or, you might notice a red rash with swelling of the face, lips or lymph nodes in your neck or under your arms. This medicine may stay in your body for up to 2 years after your last dose. Tell your doctor about any unusual side effects or symptoms. A medicine can be given to help lower your blood levels of this medicine more quickly. Women must use effective birth control with this medicine. There is a potential for serious side effects to an unborn child. Do not become pregnant while taking this medicine. Inform your doctor if you wish to become pregnant. This medicine remains in your blood after you stop taking it. You must continue using effective birth control until the blood levels have been checked and they are low enough. A medicine can be given to help lower your blood levels of this medicine more quickly. Immediately talk to your doctor if you think you may be pregnant. You may need a pregnancy test. Talk to your health care provider or pharmacist for more information. You should not receive certain vaccines during your treatment and for a certain time after your treatment with this medication ends. Talk to your health care provider for more information. What side effects may I notice from receiving this medicine? Side effects that you should report to your doctor or health care professional as soon as possible:  allergic reactions like skin rash, itching or hives, swelling of the face, lips, or tongue  breathing problems  cough  increased blood pressure  low blood counts - this medicine may decrease the number of white blood cells  and platelets. You may be at increased risk for infections and bleeding.  pain, tingling, numbness in the hands or feet  rash, fever, and swollen lymph nodes  redness, blistering, peeing or loosening of the skin, including inside the mouth  signs of decreased platelets or bleeding - bruising, pinpoint red spots on the skin, black, tarry stools, blood in urine  signs of infection - fever or chills, cough, sore throat, pain or trouble passing urine  signs and symptoms of liver injury like dark yellow or brown urine; general ill feeling or flu-like symptoms; light-colored stools; loss of appetite; nausea; right upper belly pain; unusually weak or tired; yellowing of the eyes or skin  trouble passing urine or change in the amount of urine  vomiting Side effects that usually do not require medical attention (report to your doctor or health care professional if they continue or are bothersome):  diarrhea  hair thinning or loss  headache  nausea  tiredness This list may not describe all possible side effects. Call your doctor for medical advice about side effects. You may report side effects to FDA at 1-800-FDA-1088. Where should I keep my medicine? Keep out of the   reach of children. Store at room temperature between 15 and 30 degrees C (59 and 86 degrees F). Protect from moisture and light. Throw away any unused medicine after the expiration date. NOTE: This sheet is a summary. It may not cover all possible information. If you have questions about this medicine, talk to your doctor, pharmacist, or health care provider.  2020 Elsevier/Gold Standard (2018-08-26 15:06:48)  

## 2019-04-24 NOTE — Progress Notes (Signed)
Pharmacy Note  Subjective: Patient presents today to the [facility] for follow up office visit.  Patient seen by the pharmacist for counseling on leflunomide Ranae Plumber) for psoriatic arthritis.  She is currently on Cosentyx.  Objective: CBC    Component Value Date/Time   WBC 6.8 04/14/2019 1609   RBC 4.44 04/14/2019 1609   HGB 12.7 04/14/2019 1609   HGB 12.4 09/08/2016 1648   HCT 38.0 04/14/2019 1609   HCT 37.7 09/08/2016 1648   PLT 202 04/14/2019 1609   PLT 246 09/08/2016 1648   MCV 85.6 04/14/2019 1609   MCV 84 09/08/2016 1648   MCH 28.6 04/14/2019 1609   MCHC 33.4 04/14/2019 1609   RDW 14.2 04/14/2019 1609   RDW 15.5 (H) 09/08/2016 1648   LYMPHSABS 2,244 04/14/2019 1609   LYMPHSABS 2.5 09/08/2016 1648   MONOABS 736 01/25/2017 1617   EOSABS 150 04/14/2019 1609   EOSABS 0.1 09/08/2016 1648   BASOSABS 48 04/14/2019 1609   BASOSABS 0.0 09/08/2016 1648    CMP     Component Value Date/Time   NA 141 04/14/2019 1609   NA 142 09/08/2016 1648   K 4.2 04/14/2019 1609   CL 108 04/14/2019 1609   CO2 27 04/14/2019 1609   GLUCOSE 87 04/14/2019 1609   GLUCOSE 90 04/16/2006 1125   BUN 13 04/14/2019 1609   BUN 14 09/08/2016 1648   CREATININE 1.18 (H) 04/14/2019 1609   CALCIUM 9.3 04/14/2019 1609   PROT 7.1 04/14/2019 1609   PROT 6.7 09/08/2016 1648   ALBUMIN 4.1 01/25/2017 1617   ALBUMIN 4.2 09/08/2016 1648   AST 21 04/14/2019 1609   ALT 18 04/14/2019 1609   ALKPHOS 87 01/25/2017 1617   BILITOT 0.6 04/14/2019 1609   BILITOT 0.3 09/08/2016 1648   GFRNONAA 48 (L) 04/14/2019 1609   GFRAA 56 (L) 04/14/2019 1609    Baseline Immunosuppressant Therapy Labs Quantiferon TB Gold Latest Ref Rng & Units 12/01/2018  Quantiferon TB Gold Plus NEGATIVE NEGATIVE   Hepatitis panel: negative 08/23/2015  Lab Results  Component Value Date   HIV NON-REACTIVE 10/26/2017    Immunoglobulin Electrophoresis Latest Ref Rng & Units 10/26/2017  IgA  81 - 463 mg/dL 354  IgG 656 -  8,127 mg/dL 517  IgM 48 - 001 mg/dL 71    Serum Protein Electrophoresis Latest Ref Rng & Units 04/14/2019  Total Protein 6.1 - 8.1 g/dL 7.1  Albumin 3.8 - 4.8 g/dL -  Alpha-1 0.2 - 0.3 g/dL -  Alpha-2 0.5 - 0.9 g/dL -  Beta Globulin 0.4 - 0.6 g/dL -  Beta 2 0.2 - 0.5 g/dL -  Gamma Globulin 0.8 - 1.7 g/dL -    Pregnancy status:  Hysterectomy  Assessment/Plan:  Patient was counseled on the purpose, proper use, and adverse effects of leflunomide including risk of infection, nausea/diarrhea/weight loss, increase in blood pressure, rash, hair loss, tingling in the hands and feet, and signs and symptoms of interstitial lung disease.   Also counseled on [place] warning of liver injury and importance of avoiding alcohol while on therapy. Discussed that there is the possibility of an increased risk of malignancy but it is not well understood if this increased risk is due to the medication or the disease state.  Counseled patient to avoid live vaccines. Recommend annual influenza, Pneumovax 23, Prevnar 13, and Shingrix as indicated.   Discussed the importance of frequent monitoring of liver function and blood count.  Standing orders placed.  Discussed importance of birth  control while on leflunomide due to risk of congenital abnormalities, and patient confirms hysterectomy.  Provided patient with educational materials on leflunomide and answered all questions.  Patient consented to [place] use, and consent will be uploaded into the media tab.    Patient dose will be Arava 10 mg daily for 2 weeks then increase to 20 mg daily.  All questions encouraged and answered.  Instructed patient to call with any other questions or concerns.   Mariella Saa, PharmD, [place], [place] Clinical Specialty Pharmacist [phone]  04/24/2019 1:50 PM

## 2019-04-25 ENCOUNTER — Telehealth: Payer: Self-pay | Admitting: Rheumatology

## 2019-04-25 DIAGNOSIS — L4050 Arthropathic psoriasis, unspecified: Secondary | ICD-10-CM

## 2019-04-25 NOTE — Telephone Encounter (Signed)
Patient left a voicemail stating she just noticed on her after visit summary that her prescription of Arava was sent to [facility].  Patient states they will take approximately 4-5 days before she is sent the prescription.  Patient states she requested at her appointment to have the prescription sent to her local pharmacy [facility] at [address] in [place].

## 2019-04-26 MED ORDER — LEFLUNOMIDE 10 MG PO TABS: ORAL_TABLET | ORAL | 0 refills | Status: DC

## 2019-04-26 NOTE — Telephone Encounter (Signed)
Re-sent prescription to local pharmacy.

## 2019-05-09 ENCOUNTER — Telehealth: Payer: Self-pay

## 2019-05-09 MED ORDER — COSENTYX SENSOREADY (300 MG) 150 MG/ML ~~LOC~~ SOAJ: 150.0000 mg | SUBCUTANEOUS | 0 refills | Status: DC

## 2019-05-09 NOTE — Telephone Encounter (Signed)
Refill request received via fax from [facility] for cosentyx.   Last Visit: 04/24/2019 Next Visit: 05/23/2019 Labs: 04/14/2019 stable  TB Gold: 12/01/2018 negative   Okay to refill per Dr. Estanislado Pandy.

## 2019-05-18 ENCOUNTER — Other Ambulatory Visit: Payer: Self-pay

## 2019-05-18 DIAGNOSIS — Z79899 Other long term (current) drug therapy: Secondary | ICD-10-CM | POA: Diagnosis not present

## 2019-05-19 ENCOUNTER — Other Ambulatory Visit: Payer: Self-pay | Admitting: Rheumatology

## 2019-05-19 ENCOUNTER — Other Ambulatory Visit: Payer: Self-pay

## 2019-05-19 DIAGNOSIS — R7989 Other specified abnormal findings of blood chemistry: Secondary | ICD-10-CM

## 2019-05-19 DIAGNOSIS — L4050 Arthropathic psoriasis, unspecified: Secondary | ICD-10-CM

## 2019-05-19 DIAGNOSIS — Z79899 Other long term (current) drug therapy: Secondary | ICD-10-CM

## 2019-05-19 LAB — COMPLETE METABOLIC PANEL WITH GFR
AG Ratio: 1.4 (calc) (ref 1.0–2.5)
ALT: 19 U/L (ref 6–29)
AST: 22 U/L (ref 10–35)
Albumin: 4.1 g/dL (ref 3.6–5.1)
Alkaline phosphatase (APISO): 78 U/L (ref 37–153)
BUN/Creatinine Ratio: 14 (calc) (ref 6–22)
BUN: 18 mg/dL (ref 7–25)
CO2: 25 mmol/L (ref 20–32)
Calcium: 9.2 mg/dL (ref 8.6–10.4)
Chloride: 105 mmol/L (ref 98–110)
Creat: 1.27 mg/dL — ABNORMAL HIGH (ref 0.50–0.99)
GFR, Est African American: 51 mL/min/{1.73_m2} — ABNORMAL LOW (ref >=60)
GFR, Est Non African American: 44 mL/min/{1.73_m2} — ABNORMAL LOW (ref >=60)
Globulin: 2.9 g/dL (calc) (ref 1.9–3.7)
Glucose, Bld: 97 mg/dL (ref 65–99)
Potassium: 4.2 mmol/L (ref 3.5–5.3)
Sodium: 139 mmol/L (ref 135–146)
Total Bilirubin: 0.6 mg/dL (ref 0.2–1.2)
Total Protein: 7 g/dL (ref 6.1–8.1)

## 2019-05-19 LAB — CBC WITH DIFFERENTIAL/PLATELET
Absolute Monocytes: 731 cells/uL (ref 200–950)
Basophils Absolute: 60 cells/uL (ref 0–200)
Basophils Relative: 0.7 %
Eosinophils Absolute: 103 cells/uL (ref 15–500)
Eosinophils Relative: 1.2 %
HCT: 38.4 % (ref 35.0–45.0)
Hemoglobin: 12.9 g/dL (ref 11.7–15.5)
Lymphs Abs: 2296 cells/uL (ref 850–3900)
MCH: 28 pg (ref 27.0–33.0)
MCHC: 33.6 g/dL (ref 32.0–36.0)
MCV: 83.5 fL (ref 80.0–100.0)
MPV: 11.3 fL (ref 7.5–12.5)
Monocytes Relative: 8.5 %
Neutro Abs: 5409 cells/uL (ref 1500–7800)
Neutrophils Relative %: 62.9 %
Platelets: 204 10*3/uL (ref 140–400)
RBC: 4.6 10*6/uL (ref 3.80–5.10)
RDW: 13.8 % (ref 11.0–15.0)
Total Lymphocyte: 26.7 %
WBC: 8.6 10*3/uL (ref 3.8–10.8)

## 2019-05-19 NOTE — Telephone Encounter (Signed)
Last Visit: 04/24/2019 Next Visit: 05/23/2019 Labs: 05/18/2019 We will keep her on 10 mg p.o. daily.   Okay to refill per Dr. Estanislado Pandy.

## 2019-05-19 NOTE — Progress Notes (Signed)
I had a detailed discussion with patient regarding her labs.  Her creatinine is still elevated.  There is no improvement in the creatinine level after stopping methotrexate.  She has been tolerating [place] well.  She has noticed some improvement Arava.  We will keep her on 10 mg p.o. daily.  I discussed referral to nephrology.  She was in agreement.  Please refer her to [facility].  Patient recheck BMP with GFR and 1 month.

## 2019-05-22 NOTE — Progress Notes (Signed)
Virtual Visit via Video Note  I connected with Sarah Horton on 05/23/19 at  2:30 PM EST by a video enabled telemedicine application and verified that I am speaking with the correct person using two identifiers.  Location: Patient: Home  Provider: Clinic  This service was conducted via virtual visit.  Both audio and visual tools were used.  The patient was located at home. I was located in my office.  Consent was obtained prior to the virtual visit and is aware of possible charges through their insurance for this visit.  The patient is an established patient.  Dr. Estanislado Pandy, MD conducted the virtual visit and Hazel Sams, PA-C acted as scribe during the service.  Office staff helped with scheduling follow up visits after the service was conducted.     I discussed the limitations of evaluation and management by telemedicine and the availability of in person appointments. The patient expressed understanding and agreed to proceed.  CC: Left ankle joint pain  History of Present Illness: Patient is a 68 year old female with past medical history of psoriatic arthritis.  She is taking Arava 10 mg po daily and cosentyx 150 mg sq injections every 14 days.  She discontinued MTX due to elevated creatinine.  She was referred to [facility]. She has been experiencing left ankle joint tenderness and inflammation since November 2020.  She continues to have pain in both hands.  She is wearing arthritis gloves. She continues to have persistent inflammation in the right 1st MCP joint, 2nd PIP, and left 4th PIP joint.  She has noticed minimal improvement since switching from MTX to [place].  Review of Systems  Constitutional: Positive for malaise/fatigue. Negative for fever.  Eyes: Negative for photophobia, pain, discharge and redness.  Respiratory: Negative for cough, shortness of breath and wheezing.   Cardiovascular: Negative for chest pain and palpitations.  Gastrointestinal: Negative for  blood in stool, constipation and diarrhea.  Genitourinary: Negative for dysuria.  Musculoskeletal: Positive for joint pain. Negative for back pain, myalgias and neck pain.       +Joint swelling  Skin: Negative for rash.  Neurological: Negative for dizziness and headaches.  Psychiatric/Behavioral: Negative for depression. The patient is not nervous/anxious and does not have insomnia.       Observations/Objective: Physical Exam  Constitutional: She is oriented to person, place, and time and well-developed, well-nourished, and in no distress.  HENT:  Head: Normocephalic and atraumatic.  Eyes: Conjunctivae are normal.  Pulmonary/Chest: Effort normal.  Neurological: She is alert and oriented to person, place, and time.  Psychiatric: Mood, memory, affect and judgment normal.   Patient reports morning stiffness for 30  minutes.   Patient reports nocturnal pain.  Difficulty dressing/grooming: Denies Difficulty climbing stairs: Denies Difficulty getting out of chair: Reports Difficulty using hands for taps, buttons, cutlery, and/or writing: Reports   Assessment and Plan: Visit Diagnoses: Psoriatic arthropathy ([place]) -She is experiencing persistent tenderness and inflammation in the right 1st MCP, 2nd PIP joint, and left 4th PIP joint.  She has been experiencing tenderness and inflammation in the left ankle joint.  She is on Cosentyx 150 mg  Sq injections every 14 days and Arava 10 mg 1 tablet daily.  She has noticed minimal improvement since starting on Arava.  She felt that Enbrel was more effective than cosentyx, and she would like to restart on Enbrel.  Her insurance also will not cover cosentyx in 2021.  We will reapply for Enbrel 50 mg sq injections once  weekly.  She will continue taking Arava 10 mg 1 tablet daily.  She was advised to follow up with Dr. Lajoyce Corners for evaluation of the left ankle joint pain she has been experiencing.  If the discomfort is related to inflammation she will benefit  from a prednisone taper and was advised to contact us if she would like a taper.  If the discomfort is due to an infection related to a venous statis ulcer she was advised to hold cosentyx and Arava.  She will follow up in 3 months.   Medication counseling:   Counseled patient that Enbrel is a TNF blocking agent.  Reviewed Enbrel dose of 50 mg once weekly.  Counseled patient on purpose, proper use, and adverse effects of Enbrel.  Reviewed the most common adverse effects including infections, headache, and injection site reactions. Discussed that there is the possibility of an increased risk of malignancy but it is not well understood if this increased risk is due to the medication or the disease state.  Advised patient to get yearly dermatology exams due to risk of skin cancer.  Reviewed the importance of regular labs while on Enbrel therapy.  Advised patient to get standing labs one month after starting Enbrel then every 2 months.  Provided patient with standing lab orders.  Counseled patient that Enbrel should be held prior to scheduled surgery.  Counseled patient to avoid live vaccines while on Enbrel.  Advised patient to get annual influenza vaccine and the pneumococcal vaccine as needed.  Provided patient with medication education material and answered all questions.  Patient voiced understanding.  Patient consented to Enbrel.  Will upload consent into the media tab.  Reviewed storage instructions for Enbrel.  Advised initial injection must be administered in office.  Patient voiced understanding.    Psoriasis-She has no active psoriasis lesions. No recurrences of pustular psoriasis.   High risk medication use -Cosentyx 150 mg every 14 days (started on November 16, 2017) and Arava 10 mg 1 tablet daily. She was previously on Enbrel and otezla.  She would like to restart on Enbrel.  We will apply for Enbrel.  Last TB gold negative on 12/01/2018 and will monitor yearly.  . Methotrexate discontinued in February  due to elevated creatinine.  She was referred to [facility].   Sacroiliitis (HCC)-She has intermittent discomfort bilaterally.   Status post bilateral knee replacements-Doing well.  She has no discomfort at this time.  Pain in left ankle and joints of left foot: She has been experiencing tenderness and inflammation on the lateral aspect of the left ankle joint since November 2020.  She plans on seeing Dr. Lajoyce Corners.    Raynaud's disease without gangrene  Other medical problems are listed as follows:  History of ulcer of lower limb/ Venous stasis ulcer 01/2015   Left wrist tendonitis/ history of surgery for Dequervains   Osteopenia of multiple sites-she is on calcium and vitamin D.  Primary insomnia  History of hypertension  History of gastroesophageal reflux (GERD)  History of peripheral neuropathy  History of IBS  History of migraine  History of asthma  History of hypothyroidism  Follow Up Instructions: She will follow up in 3 months.    I discussed the assessment and treatment plan with the patient. The patient was provided an opportunity to ask questions and all were answered. The patient agreed with the plan and demonstrated an understanding of the instructions.   The patient was advised to call back or seek an in-person evaluation  if the symptoms worsen or if the condition fails to improve as anticipated.  I provided 25 minutes of non-face-to-face time during this encounter.   Pollyann SavoyShaili Joselynne Killam, MD   Scribed by-  Sherron Alesaylor Dale, PA-C

## 2019-05-23 ENCOUNTER — Other Ambulatory Visit: Payer: Self-pay

## 2019-05-23 ENCOUNTER — Encounter: Payer: Self-pay | Admitting: Rheumatology

## 2019-05-23 ENCOUNTER — Telehealth (INDEPENDENT_AMBULATORY_CARE_PROVIDER_SITE_OTHER): Payer: BC Managed Care – PPO | Admitting: Rheumatology

## 2019-05-23 DIAGNOSIS — M8589 Other specified disorders of bone density and structure, multiple sites: Secondary | ICD-10-CM

## 2019-05-23 DIAGNOSIS — Z79899 Other long term (current) drug therapy: Secondary | ICD-10-CM

## 2019-05-23 DIAGNOSIS — I7300 Raynaud's syndrome without gangrene: Secondary | ICD-10-CM | POA: Diagnosis not present

## 2019-05-23 DIAGNOSIS — F5101 Primary insomnia: Secondary | ICD-10-CM

## 2019-05-23 DIAGNOSIS — M25572 Pain in left ankle and joints of left foot: Secondary | ICD-10-CM | POA: Diagnosis not present

## 2019-05-23 DIAGNOSIS — M461 Sacroiliitis, not elsewhere classified: Secondary | ICD-10-CM | POA: Diagnosis not present

## 2019-05-23 DIAGNOSIS — Z8679 Personal history of other diseases of the circulatory system: Secondary | ICD-10-CM

## 2019-05-23 DIAGNOSIS — Z8639 Personal history of other endocrine, nutritional and metabolic disease: Secondary | ICD-10-CM

## 2019-05-23 DIAGNOSIS — Z872 Personal history of diseases of the skin and subcutaneous tissue: Secondary | ICD-10-CM

## 2019-05-23 DIAGNOSIS — M778 Other enthesopathies, not elsewhere classified: Secondary | ICD-10-CM

## 2019-05-23 DIAGNOSIS — L4050 Arthropathic psoriasis, unspecified: Secondary | ICD-10-CM | POA: Diagnosis not present

## 2019-05-23 DIAGNOSIS — Z96653 Presence of artificial knee joint, bilateral: Secondary | ICD-10-CM

## 2019-05-23 DIAGNOSIS — Z8709 Personal history of other diseases of the respiratory system: Secondary | ICD-10-CM

## 2019-05-23 DIAGNOSIS — L409 Psoriasis, unspecified: Secondary | ICD-10-CM

## 2019-05-23 DIAGNOSIS — Z8669 Personal history of other diseases of the nervous system and sense organs: Secondary | ICD-10-CM

## 2019-05-23 DIAGNOSIS — Z8719 Personal history of other diseases of the digestive system: Secondary | ICD-10-CM

## 2019-05-24 ENCOUNTER — Telehealth: Payer: Self-pay | Admitting: *Deleted

## 2019-05-24 NOTE — Telephone Encounter (Signed)
Submitted a Prior Authorization request to [employer] for ENBREL via Cover My Meds. Will update once we receive a response.

## 2019-05-24 NOTE — Telephone Encounter (Signed)
Received notification from [facility] regarding a prior authorization for ENBREL. Authorization has been APPROVED from 05/24/19 to 06/07/98.   Will send document to scan center.  Authorization # [id number]  Ran test claim, patient has a $50.00 copay for 1 month supply. Patient has Pharmacist, community and is copay card eligible. Patient can call [phone] to enroll for copay card.  12:38 PM Beatriz Chancellor, CPhT

## 2019-05-24 NOTE — Telephone Encounter (Signed)
Please reapply and consent on Enbrel. Thank you.

## 2019-05-25 NOTE — Telephone Encounter (Signed)
Called to notify patient of approval.  Informed that her co-pay is $50 but she can apply for a co-pay card.  Patient instructed she can go online to enroll or can call [phone].  Patient verbalized understanding.  She has been on Enbrel in the past.  Patient had no questions or concerns about the medication.  Reviewed key differences between Enbrel and Cosentyx including dosing of Enbrel which is 1 pen every 7 days and increased risk of melanoma.  Advised patient she should have yearly skin exam.  Advised that she is still at risk for infection and to continue to practice good hand hygiene, social distancing, and wearing a mask.  CDC recommendations.  Patient verbalized understanding.  Instructed patient to call the office about scheduling an appointment for new start visit.  Patient can be consented at that time.  All questions encouraged and answered.  Instructed patient to call with any other questions or concerns.   Mariella Saa, PharmD, [place], [place] Clinical Specialty Pharmacist [phone]  05/25/2019 10:46 AM

## 2019-05-30 NOTE — Telephone Encounter (Signed)
How long does patient need to be off Cosentyx? Thank you.

## 2019-05-30 NOTE — Telephone Encounter (Signed)
She should be off Cosentyx for a month prior to Enbrel injection.

## 2019-05-30 NOTE — Telephone Encounter (Signed)
I called patient, she verbalized understanding.

## 2019-05-31 ENCOUNTER — Telehealth: Payer: Self-pay | Admitting: Rheumatology

## 2019-05-31 NOTE — Telephone Encounter (Signed)
-----   Message from Shona Needles, RT sent at 05/24/2019 11:00 AM EST ----- Regarding: 3 MONTH F/U

## 2019-05-31 NOTE — Telephone Encounter (Signed)
LMOM for patient to call and schedule 3 month follow-up appointment. °

## 2019-06-12 DIAGNOSIS — L308 Other specified dermatitis: Secondary | ICD-10-CM | POA: Diagnosis not present

## 2019-06-15 ENCOUNTER — Ambulatory Visit: Payer: BC Managed Care – PPO | Admitting: Orthopedic Surgery

## 2019-06-22 DIAGNOSIS — J310 Chronic rhinitis: Secondary | ICD-10-CM | POA: Diagnosis not present

## 2019-06-22 DIAGNOSIS — J342 Deviated nasal septum: Secondary | ICD-10-CM | POA: Diagnosis not present

## 2019-06-23 DIAGNOSIS — J310 Chronic rhinitis: Secondary | ICD-10-CM | POA: Diagnosis not present

## 2019-06-23 DIAGNOSIS — J342 Deviated nasal septum: Secondary | ICD-10-CM | POA: Diagnosis not present

## 2019-06-29 ENCOUNTER — Encounter: Payer: Self-pay | Admitting: Rheumatology

## 2019-07-13 DIAGNOSIS — D3131 Benign neoplasm of right choroid: Secondary | ICD-10-CM | POA: Diagnosis not present

## 2019-07-13 DIAGNOSIS — H0100B Unspecified blepharitis left eye, upper and lower eyelids: Secondary | ICD-10-CM | POA: Diagnosis not present

## 2019-07-13 DIAGNOSIS — H0100A Unspecified blepharitis right eye, upper and lower eyelids: Secondary | ICD-10-CM | POA: Diagnosis not present

## 2019-07-13 DIAGNOSIS — H02831 Dermatochalasis of right upper eyelid: Secondary | ICD-10-CM | POA: Diagnosis not present

## 2019-07-26 ENCOUNTER — Other Ambulatory Visit: Payer: Self-pay | Admitting: Rheumatology

## 2019-07-26 DIAGNOSIS — L4050 Arthropathic psoriasis, unspecified: Secondary | ICD-10-CM

## 2019-07-26 NOTE — Telephone Encounter (Addendum)
To soon to refill. 90 day supply sent on 05/19/19.

## 2019-08-08 ENCOUNTER — Other Ambulatory Visit: Payer: Self-pay

## 2019-08-08 DIAGNOSIS — J3489 Other specified disorders of nose and nasal sinuses: Secondary | ICD-10-CM | POA: Diagnosis not present

## 2019-08-08 DIAGNOSIS — J310 Chronic rhinitis: Secondary | ICD-10-CM | POA: Diagnosis not present

## 2019-08-08 DIAGNOSIS — Z79899 Other long term (current) drug therapy: Secondary | ICD-10-CM | POA: Diagnosis not present

## 2019-08-08 DIAGNOSIS — J329 Chronic sinusitis, unspecified: Secondary | ICD-10-CM | POA: Diagnosis not present

## 2019-08-09 LAB — COMPLETE METABOLIC PANEL WITH GFR
AG Ratio: 1.5 (calc) (ref 1.0–2.5)
ALT: 25 U/L (ref 6–29)
AST: 23 U/L (ref 10–35)
Albumin: 4.1 g/dL (ref 3.6–5.1)
Alkaline phosphatase (APISO): 91 U/L (ref 37–153)
BUN/Creatinine Ratio: 17 (calc) (ref 6–22)
BUN: 18 mg/dL (ref 7–25)
CO2: 24 mmol/L (ref 20–32)
Calcium: 9.1 mg/dL (ref 8.6–10.4)
Chloride: 106 mmol/L (ref 98–110)
Creat: 1.05 mg/dL — ABNORMAL HIGH (ref 0.50–0.99)
GFR, Est African American: 64 mL/min/{1.73_m2} (ref >=60)
GFR, Est Non African American: 55 mL/min/{1.73_m2} — ABNORMAL LOW (ref >=60)
Globulin: 2.7 g/dL (calc) (ref 1.9–3.7)
Glucose, Bld: 100 mg/dL — ABNORMAL HIGH (ref 65–99)
Potassium: 4.4 mmol/L (ref 3.5–5.3)
Sodium: 139 mmol/L (ref 135–146)
Total Bilirubin: 0.4 mg/dL (ref 0.2–1.2)
Total Protein: 6.8 g/dL (ref 6.1–8.1)

## 2019-08-09 LAB — CBC WITH DIFFERENTIAL/PLATELET
Absolute Monocytes: 667 cells/uL (ref 200–950)
Basophils Absolute: 40 cells/uL (ref 0–200)
Basophils Relative: 0.6 %
Eosinophils Absolute: 79 cells/uL (ref 15–500)
Eosinophils Relative: 1.2 %
HCT: 36.5 % (ref 35.0–45.0)
Hemoglobin: 12.2 g/dL (ref 11.7–15.5)
Lymphs Abs: 1815 cells/uL (ref 850–3900)
MCH: 27.6 pg (ref 27.0–33.0)
MCHC: 33.4 g/dL (ref 32.0–36.0)
MCV: 82.6 fL (ref 80.0–100.0)
MPV: 11.6 fL (ref 7.5–12.5)
Monocytes Relative: 10.1 %
Neutro Abs: 4000 cells/uL (ref 1500–7800)
Neutrophils Relative %: 60.6 %
Platelets: 168 10*3/uL (ref 140–400)
RBC: 4.42 10*6/uL (ref 3.80–5.10)
RDW: 13.9 % (ref 11.0–15.0)
Total Lymphocyte: 27.5 %
WBC: 6.6 10*3/uL (ref 3.8–10.8)

## 2019-08-09 NOTE — Progress Notes (Signed)
CBC is normal.  GFR is low and improved.

## 2019-08-14 NOTE — Progress Notes (Signed)
Office Visit Note  Patient: Sarah Horton             Date of Birth: 1950/08/15           MRN: [id number]             PCP: Noralee Space, MD Referring: Noralee Space, MD Visit Date: 08/22/2019 Occupation: @GUAROCC @  Subjective:  Medication management.   History of Present Illness: Sarah Horton is a 69 y.o. female with history of psoriatic arthritis, psoriasis and osteoarthritis.  She was on Cosentyx from June 2019 until December 2020.  She discontinued her Cosentyx because of insurance issues.  The plan was to start on Enbrel.  She was having recurrent sinus infections and stasis edema.  She was seen by dermatologist who prescribed some topical steroids.  She is a still struggling from it.  She has been taking Arava on a regular basis.  She continues to have some discomfort in her hands but no increased swelling.  She has some discomfort in her left foot.  She also has some rash due to venous insufficiency on her left foot.  Activities of Daily Living:  Patient reports morning stiffness for 30-40 minutes.   Patient Denies nocturnal pain.  Difficulty dressing/grooming: Reports Difficulty climbing stairs: Denies Difficulty getting out of chair: Denies Difficulty using hands for taps, buttons, cutlery, and/or writing: Reports  Review of Systems  Constitutional: Positive for fatigue. Negative for night sweats, weight gain and weight loss.  HENT: Positive for mouth dryness. Negative for mouth sores, trouble swallowing, trouble swallowing and nose dryness.   Eyes: Negative for pain, redness, itching, visual disturbance and dryness.  Respiratory: Negative for cough, shortness of breath and difficulty breathing.   Cardiovascular: Negative for chest pain, palpitations, hypertension, irregular heartbeat and swelling in legs/feet.  Gastrointestinal: Negative for blood in stool, constipation and diarrhea.  Endocrine: Negative for increased urination.  Genitourinary: Negative  for difficulty urinating, painful urination and vaginal dryness.  Musculoskeletal: Positive for arthralgias, joint pain, joint swelling and morning stiffness. Negative for myalgias, muscle weakness, muscle tenderness and myalgias.  Skin: Positive for rash and redness. Negative for color change, hair loss, skin tightness, ulcers and sensitivity to sunlight.  Allergic/Immunologic: Negative for susceptible to infections.  Neurological: Positive for numbness. Negative for dizziness, memory loss, night sweats and weakness.  Hematological: Negative for swollen glands.  Psychiatric/Behavioral: Negative for depressed mood, confusion and sleep disturbance. The patient is not nervous/anxious.     PMFS History:  Patient Active Problem List   Diagnosis Date Noted  . History of peripheral neuropathy 11/20/2016  . Left wrist tendonitis/ history of surgery for Dequervains  11/20/2016  . Raynaud's disease without gangrene 09/22/2016  . High risk medication use 09/22/2016  . History of ulcer of lower limb/ Venous stasis ulcer 01/2015  09/22/2016  . Osteopenia of multiple sites 09/22/2016  . Psoriasis 09/03/2016  . Chest wall pain 07/16/2016  . Flank pain, acute 05/15/2016  . Subacromial bursitis of right shoulder joint 04/10/2016  . Low back pain 04/09/2016  . Pre-ulcerative calluses 02/11/2016  . Varicose veins of right lower extremity with complications 55/73/2202  . Elevated BP 08/09/2015  . Varicose veins of left lower extremity with complications 54/27/0623  . Varicose veins of bilateral lower extremities with other complications 76/28/3151  . Varicose veins of lower extremities with ulcer ([place]) 04/05/2015  . Status post bilateral knee replacements 12/13/2013  . Venous stasis ulcer of ankle ([place]) 06/06/2013  . ADD (attention deficit  disorder) 04/04/2012  . INSOMNIA 06/20/2010  . Hypothyroidism 01/17/2010  . Obesity 01/17/2010  . Osteoarthritis 01/17/2010  . ANEMIA 10/02/2009  . SINUSITIS,  ACUTE 10/02/2009  . Asthma 10/02/2009  . Venous (peripheral) insufficiency 03/11/2008  . Allergic rhinitis 03/11/2008  . BRONCHITIS, RECURRENT 03/11/2008  . History of migraine 03/11/2008  . Anxiety state 11/24/2007  . Hereditary and idiopathic peripheral neuropathy 11/24/2007  . GERD 11/24/2007  . IRRITABLE BOWEL SYNDROME 11/24/2007  . PSORIATIC ARTHROPATHY 11/24/2007    Past Medical History:  Diagnosis Date  . Allergic rhinitis, cause unspecified   . Allergy   . Anemia, unspecified   . Anxiety state, unspecified   . Arthritis   . Cataract    bilaterally removed 2003,2007  . Esophageal reflux   . Irritable bowel syndrome   . Migraine   . Obesity, unspecified   . Psoriatic arthropathy (HCC)   . Unspecified chronic bronchitis (HCC)   . Unspecified hereditary and idiopathic peripheral neuropathy   . Unspecified hypothyroidism   . Unspecified venous (peripheral) insufficiency     Family History  Problem Relation Age of Onset  . Cirrhosis Father   . COPD Mother   . Heart disease Mother   . Hypertension Mother   . Varicose Veins Mother   . Breast cancer Maternal Grandmother   . Breast cancer Other        1st cousin   . Pancreatic cancer Other        1st cousin   . Thyroid disease Sister   . Colon cancer Neg Hx   . Colon polyps Neg Hx   . Esophageal cancer Neg Hx   . Rectal cancer Neg Hx   . Stomach cancer Neg Hx    Past Surgical History:  Procedure Laterality Date  . ABDOMINAL HYSTERECTOMY    . ABDOMINAL HYSTERECTOMY    . ABLATION ON ENDOMETRIOSIS     x2  . Bilat TKRs  04/2010   by [name]  . BUNIONECTOMY  2009  . CATARACT EXTRACTION, BILATERAL     0315,9458  . COLONOSCOPY  08-26-2004   with gessner tics and hems   . dequervains tensenovitis Left   . JOINT REPLACEMENT Bilateral    knees  . NASAL SINUS SURGERY    . SHOULDER SURGERY  2005  . TOTAL SHOULDER ARTHROPLASTY     Social History   Social History Narrative  . Not on file   Immunization  History  Administered Date(s) Administered  . H1N1 05/18/2008  . Influenza Inj Mdck Quad Pf 04/01/2017  . Influenza Inj Mdck Quad With Preservative 04/13/2018  . Influenza Split 03/23/2011, 03/14/2012, 03/23/2013, 03/22/2016  . Influenza Whole 03/08/2008, 03/08/2009, 03/07/2010  . Influenza,inj,Quad PF,6+ Mos 03/21/2014, 03/31/2019  . Td 01/06/2002  . Tdap 03/14/2012     Objective: Vital Signs: BP 127/86 (BP Location: Left Arm, Patient Position: Sitting, Cuff Size: Normal)   Pulse (!) 114   Resp 15   Ht 5' 8.5" (1.74 m)   Wt 241 lb (109.3 kg)   BMI 36.11 kg/m    Physical Exam Vitals and nursing note reviewed.  Constitutional:      Appearance: She is well-developed.  HENT:     Head: Normocephalic and atraumatic.  Eyes:     Conjunctiva/sclera: Conjunctivae normal.  Cardiovascular:     Rate and Rhythm: Normal rate and regular rhythm.     Heart sounds: Normal heart sounds.  Pulmonary:     Effort: Pulmonary effort is normal.     Breath  sounds: Normal breath sounds.  Abdominal:     General: Bowel sounds are normal.     Palpations: Abdomen is soft.  Musculoskeletal:     Cervical back: Normal range of motion.  Lymphadenopathy:     Cervical: No cervical adenopathy.  Skin:    General: Skin is warm and dry.     Capillary Refill: Capillary refill takes less than 2 seconds.     Findings: Erythema present.     Comments: noted on left lower extremity.  Neurological:     Mental Status: She is alert and oriented to person, place, and time.  Psychiatric:        Behavior: Behavior normal.      Musculoskeletal Exam: C-spine was in good range of motion.  Lumbar spine was in good range of motion.  She has some tenderness over bilateral SI joints.  Shoulder joints and elbow joints with good range of motion.  She had no synovitis or tenderness over wrist joints.  Synovitis was noted over right first and second MCP joints.  Hip joints, knee joints, ankles,  good range of motion.  She had  no tenderness across MTPs.  CDAI Exam: CDAI Score: 5  Patient Global: 5 mm; Provider Global: 5 mm Swollen: 2 ; Tender: 4  Joint Exam 08/22/2019      Right  Left  MCP 1  Swollen Tender     MCP 2  Swollen Tender     Sacroiliac   Tender   Tender     Investigation: No additional findings.  Imaging: No results found.  Recent Labs: Lab Results  Component Value Date   WBC 6.6 08/08/2019   HGB 12.2 08/08/2019   PLT 168 08/08/2019   NA 139 08/08/2019   K 4.4 08/08/2019   CL 106 08/08/2019   CO2 24 08/08/2019   GLUCOSE 100 (H) 08/08/2019   BUN 18 08/08/2019   CREATININE 1.05 (H) 08/08/2019   BILITOT 0.4 08/08/2019   ALKPHOS 87 01/25/2017   AST 23 08/08/2019   ALT 25 08/08/2019   PROT 6.8 08/08/2019   ALBUMIN 4.1 01/25/2017   CALCIUM 9.1 08/08/2019   GFRAA 64 08/08/2019   QFTBGOLDPLUS NEGATIVE 12/01/2018    Speciality Comments: Prior therapy: Methotrexate (elevated creatinine)  Procedures:  No procedures performed Allergies: Nucynta [tapentadol], Codeine, and Penicillins   Assessment / Plan:     Visit Diagnoses: Psoriatic arthropathy (HCC)-patient continues to have some synovitis in her right hand.  She also has some SI joint pain.  She is currently on Arava 20 mg p.o. daily.  She came off Cosentyx due to insurance issues.  The plan was to start her on Enbrel.  Although she has been having recurrent problems with left lower extremity redness.  She has had ulcers on her left lower extremity in the past.  Despite having synovitis we decided not to add any further immunosuppresents at this point.  Psoriasis-she currently does not have any psoriasis lesions.  High risk medication use - Cosentyx (started on November 16, 2017-December 2020) and Arava 10 mg 1 tablet daily. She was previously on Enbrel and otezla.  Labs obtained in March were stable.  She will get labs every 3 months.  Sacroiliitis (HCC)-she continues to have some tenderness over SI joints.  Status post bilateral  knee replacements-doing well.  Raynaud's disease without gangrene-she has been wearing protective clothing which has been helpful.  History of ulcer of lower limb/ Venous stasis ulcer 01/2015-she continues to have some redness on her  left lower extremity.  Have advised her to schedule an appointment with the vascular surgery.  Osteopenia of multiple sites-she is on calcium and vitamin D.  Other medical problems are listed as follows:  Left wrist tendonitis/ history of surgery for Dequervains   Primary insomnia  History of hypertension  History of IBS  History of migraine  History of peripheral neuropathy  History of gastroesophageal reflux (GERD)  History of asthma  History of hypothyroidism  Orders: No orders of the defined types were placed in this encounter.  No orders of the defined types were placed in this encounter.   Face-to-face time spent with patient was 30 minutes. Greater than 50% of time was spent in counseling and coordination of care.  Follow-Up Instructions: Return in about 5 months (around 01/22/2020) for Psoriatic arthritis.   Pollyann Savoy, MD  Note - This record has been created using Animal nutritionist.  Chart creation errors have been sought, but may not always  have been located. Such creation errors do not reflect on  the standard of medical care.

## 2019-08-22 ENCOUNTER — Encounter: Payer: Self-pay | Admitting: Rheumatology

## 2019-08-22 ENCOUNTER — Ambulatory Visit (INDEPENDENT_AMBULATORY_CARE_PROVIDER_SITE_OTHER): Payer: BC Managed Care – PPO | Admitting: Rheumatology

## 2019-08-22 ENCOUNTER — Other Ambulatory Visit: Payer: Self-pay

## 2019-08-22 VITALS — BP 127/86 | HR 114 | Resp 15 | Ht 68.5 in | Wt 241.0 lb

## 2019-08-22 DIAGNOSIS — L409 Psoriasis, unspecified: Secondary | ICD-10-CM

## 2019-08-22 DIAGNOSIS — Z872 Personal history of diseases of the skin and subcutaneous tissue: Secondary | ICD-10-CM

## 2019-08-22 DIAGNOSIS — Z8679 Personal history of other diseases of the circulatory system: Secondary | ICD-10-CM

## 2019-08-22 DIAGNOSIS — Z8709 Personal history of other diseases of the respiratory system: Secondary | ICD-10-CM

## 2019-08-22 DIAGNOSIS — Z8669 Personal history of other diseases of the nervous system and sense organs: Secondary | ICD-10-CM

## 2019-08-22 DIAGNOSIS — Z96653 Presence of artificial knee joint, bilateral: Secondary | ICD-10-CM

## 2019-08-22 DIAGNOSIS — Z79899 Other long term (current) drug therapy: Secondary | ICD-10-CM | POA: Diagnosis not present

## 2019-08-22 DIAGNOSIS — M461 Sacroiliitis, not elsewhere classified: Secondary | ICD-10-CM | POA: Diagnosis not present

## 2019-08-22 DIAGNOSIS — L4050 Arthropathic psoriasis, unspecified: Secondary | ICD-10-CM | POA: Diagnosis not present

## 2019-08-22 DIAGNOSIS — F5101 Primary insomnia: Secondary | ICD-10-CM

## 2019-08-22 DIAGNOSIS — Z8719 Personal history of other diseases of the digestive system: Secondary | ICD-10-CM

## 2019-08-22 DIAGNOSIS — I7300 Raynaud's syndrome without gangrene: Secondary | ICD-10-CM

## 2019-08-22 DIAGNOSIS — M778 Other enthesopathies, not elsewhere classified: Secondary | ICD-10-CM

## 2019-08-22 DIAGNOSIS — M8589 Other specified disorders of bone density and structure, multiple sites: Secondary | ICD-10-CM

## 2019-08-22 DIAGNOSIS — Z8639 Personal history of other endocrine, nutritional and metabolic disease: Secondary | ICD-10-CM

## 2019-08-22 NOTE — Patient Instructions (Signed)
Standing Labs We placed an order today for your standing lab work.    Please come back and get your standing labs in June and every 3 months.   We have open lab daily Monday through Thursday from 8:30-12:30 PM and 1:30-4:30 PM and Friday from 8:30-12:30 PM and 1:30-4:00 PM at the office of Dr. Ravynn Hogate.   You may experience shorter wait times on Monday and Friday afternoons. The office is located at [address], [address], [place], [place] [address] No appointment is necessary.   Labs are drawn by [facility].  You may receive a bill from [facility] for your lab work.  If you wish to have your labs drawn at another location, please call the office 24 hours in advance to send orders.  If you have any questions regarding directions or hours of operation,  please call [phone].   Just as a reminder please drink plenty of water prior to coming for your lab work. Thanks!  

## 2019-08-31 ENCOUNTER — Other Ambulatory Visit: Payer: Self-pay | Admitting: Rheumatology

## 2019-08-31 DIAGNOSIS — L4050 Arthropathic psoriasis, unspecified: Secondary | ICD-10-CM

## 2019-08-31 NOTE — Telephone Encounter (Signed)
Last Visit: 08/22/19 Next visit: 01/25/20 Labs: 08/08/19 CBC is normal. GFR is low and improved.  Current Dose per office note on 08/22/19 Arava 10 mg 1 tablet daily  Okay to refill per Dr. Corliss Skains

## 2019-10-18 DIAGNOSIS — F902 Attention-deficit hyperactivity disorder, combined type: Secondary | ICD-10-CM | POA: Diagnosis not present

## 2019-10-18 DIAGNOSIS — F422 Mixed obsessional thoughts and acts: Secondary | ICD-10-CM | POA: Diagnosis not present

## 2019-10-18 DIAGNOSIS — F331 Major depressive disorder, recurrent, moderate: Secondary | ICD-10-CM | POA: Diagnosis not present

## 2019-10-18 DIAGNOSIS — F411 Generalized anxiety disorder: Secondary | ICD-10-CM | POA: Diagnosis not present

## 2019-10-25 ENCOUNTER — Other Ambulatory Visit: Payer: Self-pay | Admitting: *Deleted

## 2019-10-25 DIAGNOSIS — I83892 Varicose veins of left lower extremities with other complications: Secondary | ICD-10-CM

## 2019-11-02 ENCOUNTER — Encounter: Payer: Self-pay | Admitting: Vascular Surgery

## 2019-11-02 ENCOUNTER — Ambulatory Visit (HOSPITAL_COMMUNITY)
Admission: RE | Admit: 2019-11-02 | Discharge: 2019-11-02 | Disposition: A | Discharge status: Home / Self Care | Payer: BC Managed Care – PPO | Source: Ambulatory Visit | Attending: Emergency Medicine | Admitting: Emergency Medicine

## 2019-11-02 ENCOUNTER — Other Ambulatory Visit: Payer: Self-pay

## 2019-11-02 ENCOUNTER — Ambulatory Visit (INDEPENDENT_AMBULATORY_CARE_PROVIDER_SITE_OTHER): Payer: BC Managed Care – PPO | Admitting: Vascular Surgery

## 2019-11-02 VITALS — BP 124/79 | HR 100 | Temp 97.9°F | Resp 18 | Ht 68.0 in | Wt 233.2 lb

## 2019-11-02 DIAGNOSIS — I83813 Varicose veins of bilateral lower extremities with pain: Secondary | ICD-10-CM

## 2019-11-02 DIAGNOSIS — I83892 Varicose veins of left lower extremities with other complications: Secondary | ICD-10-CM | POA: Diagnosis not present

## 2019-11-02 DIAGNOSIS — I872 Venous insufficiency (chronic) (peripheral): Secondary | ICD-10-CM | POA: Diagnosis not present

## 2019-11-02 NOTE — Progress Notes (Signed)
REASON FOR CONSULT:    Venous stasis dermatitis.  The consult was requested by Dr. Donzetta Starch.  ASSESSMENT & PLAN:   CHRONIC VENOUS INSUFFICIENCY: This patient has CEAP C4 venous disease.  Her symptoms are more significant on the left side.  She does have some deep venous reflux on the left but only a short segment of reflux in the proximal left great saphenous vein.  On the right side she does have significant superficial venous reflux involving the great saphenous vein however her symptoms are more tolerable on the right side.  We have discussed the importance of intermittent leg elevation and the proper positioning for this.  I have encouraged her to continue to wear her knee-high compression stockings with a gradient of 20 to 30 mmHg.  We discussed using thigh-high stockings however she states that they do not fit well as her thighs are disproportionately large compared to her calves.  For this reason I have recommended that she continue with the knee-high 20-30 stockings and then use a compression dressing on the thigh.  I have encouraged her to avoid prolonged sitting and standing.  She does sit quite a bit at work.  We also discussed the importance of exercise specifically walking and water aerobics.  In addition we discussed the importance of maintaining a healthy weight as central obesity especially increases lower extremity venous pressure.  If her symptoms progress then I think she would be a candidate for laser ablation of the right great saphenous vein to the distal thigh.  She will call if her symptoms progress.  Waverly Ferrari, MD Office: [phone]   HPI:   CRESENCIA ASMUS is a pleasant 69 y.o. female, who was referred with venous stasis changes and stasis dermatitis of the lower extremities.  I have reviewed the records from the referring office.  The patient was seen on 09/01/2019.  The patient had varicose veins bilaterally and chronic venous insufficiency.  She is  referred for vascular evaluation.  Of note she had laser ablation of the right small saphenous vein and left great saphenous vein in 2017 by Dr. Hart [place].  The patient presents today complaining of aching pain and heaviness in both legs but especially on the left side.  She also has itching in the left leg especially related to her stasis dermatitis.  Her symptoms are alleviated some with elevation.  She does wear knee-high compression stockings with a gradient of 20 to 30 mmHg which definitely help her symptoms.  She has had no previous history of DVT.  She does sit quite a bit and this makes her symptoms worse.  Her symptoms are worse at the end of the day.  Past Medical History:  Diagnosis Date  . Allergic rhinitis, cause unspecified   . Allergy   . Anemia, unspecified   . Anxiety state, unspecified   . Arthritis   . Cataract    bilaterally removed 2003,2007  . Esophageal reflux   . Irritable bowel syndrome   . Migraine   . Obesity, unspecified   . Psoriatic arthropathy (HCC)   . Unspecified chronic bronchitis (HCC)   . Unspecified hereditary and idiopathic peripheral neuropathy   . Unspecified hypothyroidism   . Unspecified venous (peripheral) insufficiency     Family History  Problem Relation Age of Onset  . Cirrhosis Father   . COPD Mother   . Heart disease Mother   . Hypertension Mother   . Varicose Veins Mother   . Breast cancer Maternal Grandmother   .  Breast cancer Other        1st cousin   . Pancreatic cancer Other        1st cousin   . Thyroid disease Sister   . Colon cancer Neg Hx   . Colon polyps Neg Hx   . Esophageal cancer Neg Hx   . Rectal cancer Neg Hx   . Stomach cancer Neg Hx     SOCIAL HISTORY: Social History   Socioeconomic History  . Marital status: Married    Spouse name: Not on file  . Number of children: Not on file  . Years of education: Not on file  . Highest education level: Not on file  Occupational History  . Occupation: Compliance  officer  Tobacco Use  . Smoking status: Former Smoker    Packs/day: 1.50    Years: 22.00    Pack years: 33.00    Types: Cigarettes    Quit date: 06/08/1992    Years since quitting: 27.4  . Smokeless tobacco: Never Used  Substance and Sexual Activity  . Alcohol use: Yes    Alcohol/week: 0.0 standard drinks    Comment: occasional wine  . Drug use: Never  . Sexual activity: Not on file  Other Topics Concern  . Not on file  Social History Narrative  . Not on file   Social Determinants of Health   Financial Resource Strain:   . Difficulty of Paying Living Expenses:   Food Insecurity:   . Worried About Charity fundraiser in the Last Year:   . Arboriculturist in the Last Year:   Transportation Needs:   . Film/video editor (Medical):   Marland Kitchen Lack of Transportation (Non-Medical):   Physical Activity:   . Days of Exercise per Week:   . Minutes of Exercise per Session:   Stress:   . Feeling of Stress :   Social Connections:   . Frequency of Communication with Friends and Family:   . Frequency of Social Gatherings with Friends and Family:   . Attends Religious Services:   . Active Member of Clubs or Organizations:   . Attends Archivist Meetings:   Marland Kitchen Marital Status:   Intimate Partner Violence:   . Fear of Current or Ex-Partner:   . Emotionally Abused:   Marland Kitchen Physically Abused:   . Sexually Abused:     Allergies  Allergen Reactions  . Nucynta [Tapentadol]     Hives  . Codeine     REACTION: severe nausea by itself  . Penicillins     REACTION: hives all over    Current Outpatient Medications  Medication Sig Dispense Refill  . amphetamine-dextroamphetamine (ADDERALL XR) 15 MG 24 hr capsule Take by mouth daily.  0  . calcium carbonate (OSCAL) 1500 (600 Ca) MG TABS tablet Take by mouth.    . cetirizine (ZYRTEC) 10 MG tablet Take 10 mg by mouth daily.    . Cholecalciferol (VITAMIN D3) 1000 units CAPS Take by mouth.    . CLONIDINE HCL PO Take by mouth daily.      . DULoxetine (CYMBALTA) 30 MG capsule     . famotidine (PEPCID) 10 MG tablet Take 20 mg by mouth daily.     . fluticasone (FLONASE) 50 MCG/ACT nasal spray Place into both nostrils as needed for allergies or rhinitis.    . folic acid (FOLVITE) 1 MG tablet Take 2 tablets (2 mg total) by mouth daily. 180 tablet 4  . leflunomide (ARAVA) 10 MG  tablet Take 1 tablet (10 mg total) by mouth daily. 90 tablet 0  . MAGNESIUM PO Take 750 mg by mouth daily.    . methocarbamol (ROBAXIN) 500 MG tablet TAKE 1 TABLET BY MOUTH 3 TIMES A DAY AS NEEDED 90 tablet 2  . montelukast (SINGULAIR) 10 MG tablet Take 10 mg by mouth at bedtime.     . Multiple Vitamin (MULTIVITAMIN) tablet Take 1 tablet by mouth daily.      . promethazine (PHENERGAN) 25 MG tablet Take 1 tablet (25 mg total) by mouth every 6 (six) hours as needed for nausea or vomiting. 30 tablet 2  . SALINE MIST SPRAY NA Place into the nose.    . SUMAtriptan (IMITREX) 20 MG/ACT nasal spray Place 1 spray (20 mg total) into the nose every 2 (two) hours as needed. 1 Inhaler 2  . SYNTHROID 88 MCG tablet Take 88 mcg by mouth daily.  12  . traMADol (ULTRAM) 50 MG tablet Take 1 tablet (50 mg total) by mouth 2 (two) times daily as needed. 60 tablet 1  . triamcinolone ointment (KENALOG) 0.1 % Apply 1 application topically 2 (two) times daily.     No current facility-administered medications for this visit.    REVIEW OF SYSTEMS:  [X]  denotes positive finding, [ ]  denotes negative finding Cardiac  Comments:  Chest pain or chest pressure:    Shortness of breath upon exertion:    Short of breath when lying flat:    Irregular heart rhythm:        Vascular    Pain in calf, thigh, or hip brought on by ambulation:    Pain in feet at night that wakes you up from your sleep:     Blood clot in your veins:    Leg swelling:         Pulmonary    Oxygen at home:    Productive cough:     Wheezing:         Neurologic    Sudden weakness in arms or legs:     Sudden  numbness in arms or legs:     Sudden onset of difficulty speaking or slurred speech:    Temporary loss of vision in one eye:     Problems with dizziness:         Gastrointestinal    Blood in stool:     Vomited blood:         Genitourinary    Burning when urinating:     Blood in urine:        Psychiatric    Major depression:         Hematologic    Bleeding problems:    Problems with blood clotting too easily:        Skin    Rashes or ulcers:        Constitutional    Fever or chills:     PHYSICAL EXAM:   Vitals:   11/02/19 1527  BP: 124/79  Pulse: 100  Resp: 18  Temp: 97.9 F (36.6 C)  TempSrc: Temporal  SpO2: 98%  Weight: 233 lb 3.2 oz (105.8 kg)  Height: 5\' 8"  (1.727 m)    GENERAL: The patient is a well-nourished female, in no acute distress. The vital signs are documented above. CARDIAC: There is a regular rate and rhythm.  VASCULAR: I do not detect carotid bruits. On the right side she has a palpable dorsalis pedis pulse.  I cannot palpate a posterior tibial pulse.  On the left side she has a palpable posterior tibial pulse.  I cannot palpate a dorsalis pedis pulse. She has hyperpigmentation bilaterally which is more significant on the left side.  I was unable to take a picture as [employer]EP[employer] was not functioning. PULMONARY: There is good air exchange bilaterally without wheezing or rales. ABDOMEN: Soft and non-tender with normal pitched bowel sounds.  MUSCULOSKELETAL: There are no major deformities or cyanosis. NEUROLOGIC: No focal weakness or paresthesias are detected. SKIN: There are no ulcers or rashes noted. PSYCHIATRIC: The patient has a normal affect.  DATA:    VENOUS DUPLEX: I have independently interpreted the patient's venous duplex scan today.  On the right side there is no evidence of DVT.  There is no deep venous reflux.  There is superficial venous reflux in the great saphenous vein from the saphenofemoral junction to the proximal calf.  The vein  has diameters ranging from 0.43-0.51 cm.  On the left side there is no evidence of DVT.  There is deep venous reflux involving the common femoral vein.  There is superficial venous reflux in the proximal great saphenous vein in the thigh.  The vein is dilated up to 0.5 cm.  There is a short segment of reflux in the small saphenous vein on the left.  The vein is not especially dilated.

## 2019-11-18 ENCOUNTER — Other Ambulatory Visit: Payer: Self-pay | Admitting: Rheumatology

## 2019-11-18 DIAGNOSIS — L4050 Arthropathic psoriasis, unspecified: Secondary | ICD-10-CM

## 2019-11-20 NOTE — Telephone Encounter (Signed)
Last Visit: 08/22/19 Next visit: 01/25/20 Labs:08/08/19 CBC is normal. GFR is low and improved.  Current Dose per office note on 08/22/19 Arava 10 mg 1 tablet daily  Left message to advise patient she is due to update labs.   Okay to refill 30 day supply Arava?

## 2019-11-27 ENCOUNTER — Telehealth: Payer: Self-pay | Admitting: Rheumatology

## 2019-11-27 DIAGNOSIS — R7301 Impaired fasting glucose: Secondary | ICD-10-CM | POA: Diagnosis not present

## 2019-11-27 DIAGNOSIS — E039 Hypothyroidism, unspecified: Secondary | ICD-10-CM | POA: Diagnosis not present

## 2019-11-27 DIAGNOSIS — E669 Obesity, unspecified: Secondary | ICD-10-CM | POA: Diagnosis not present

## 2019-11-27 DIAGNOSIS — L659 Nonscarring hair loss, unspecified: Secondary | ICD-10-CM | POA: Diagnosis not present

## 2019-11-27 DIAGNOSIS — Z79899 Other long term (current) drug therapy: Secondary | ICD-10-CM

## 2019-11-27 NOTE — Telephone Encounter (Signed)
Lab Orders released.  

## 2019-11-27 NOTE — Telephone Encounter (Signed)
Patient called requesting her labwork orders be sent to [facility] in [facility] on [address] [place].  Patient is going today 12/04/19 at 11:30 am.

## 2019-12-01 DIAGNOSIS — I10 Essential (primary) hypertension: Secondary | ICD-10-CM | POA: Diagnosis not present

## 2019-12-01 DIAGNOSIS — L4050 Arthropathic psoriasis, unspecified: Secondary | ICD-10-CM | POA: Diagnosis not present

## 2019-12-01 DIAGNOSIS — N1831 Chronic kidney disease, stage 3a: Secondary | ICD-10-CM | POA: Diagnosis not present

## 2019-12-01 DIAGNOSIS — E669 Obesity, unspecified: Secondary | ICD-10-CM | POA: Diagnosis not present

## 2019-12-01 DIAGNOSIS — Z79899 Other long term (current) drug therapy: Secondary | ICD-10-CM | POA: Diagnosis not present

## 2019-12-02 LAB — COMPLETE METABOLIC PANEL WITH GFR
AG Ratio: 1.6 (calc) (ref 1.0–2.5)
ALT: 16 U/L (ref 6–29)
AST: 19 U/L (ref 10–35)
Albumin: 3.9 g/dL (ref 3.6–5.1)
Alkaline phosphatase (APISO): 92 U/L (ref 37–153)
BUN/Creatinine Ratio: 15 (calc) (ref 6–22)
BUN: 15 mg/dL (ref 7–25)
CO2: 24 mmol/L (ref 20–32)
Calcium: 8.9 mg/dL (ref 8.6–10.4)
Chloride: 107 mmol/L (ref 98–110)
Creat: 1 mg/dL — ABNORMAL HIGH (ref 0.50–0.99)
GFR, Est African American: 68 mL/min/{1.73_m2} (ref >=60)
GFR, Est Non African American: 59 mL/min/{1.73_m2} — ABNORMAL LOW (ref >=60)
Globulin: 2.5 g/dL (calc) (ref 1.9–3.7)
Glucose, Bld: 97 mg/dL (ref 65–99)
Potassium: 4.4 mmol/L (ref 3.5–5.3)
Sodium: 140 mmol/L (ref 135–146)
Total Bilirubin: 0.5 mg/dL (ref 0.2–1.2)
Total Protein: 6.4 g/dL (ref 6.1–8.1)

## 2019-12-02 LAB — CBC WITH DIFFERENTIAL/PLATELET
Absolute Monocytes: 622 cells/uL (ref 200–950)
Basophils Absolute: 28 cells/uL (ref 0–200)
Basophils Relative: 0.5 %
Eosinophils Absolute: 78 cells/uL (ref 15–500)
Eosinophils Relative: 1.4 %
HCT: 37.4 % (ref 35.0–45.0)
Hemoglobin: 12.2 g/dL (ref 11.7–15.5)
Lymphs Abs: 1938 cells/uL (ref 850–3900)
MCH: 26.1 pg — ABNORMAL LOW (ref 27.0–33.0)
MCHC: 32.6 g/dL (ref 32.0–36.0)
MCV: 79.9 fL — ABNORMAL LOW (ref 80.0–100.0)
MPV: 11.1 fL (ref 7.5–12.5)
Monocytes Relative: 11.1 %
Neutro Abs: 2934 cells/uL (ref 1500–7800)
Neutrophils Relative %: 52.4 %
Platelets: 176 10*3/uL (ref 140–400)
RBC: 4.68 10*6/uL (ref 3.80–5.10)
RDW: 14.1 % (ref 11.0–15.0)
Total Lymphocyte: 34.6 %
WBC: 5.6 10*3/uL (ref 3.8–10.8)

## 2019-12-03 NOTE — Telephone Encounter (Signed)
CBC and CMP are stable.

## 2019-12-04 ENCOUNTER — Other Ambulatory Visit: Payer: Self-pay | Admitting: Nephrology

## 2019-12-04 DIAGNOSIS — N1831 Chronic kidney disease, stage 3a: Secondary | ICD-10-CM

## 2019-12-14 ENCOUNTER — Ambulatory Visit
Admission: RE | Admit: 2019-12-14 | Discharge: 2019-12-14 | Disposition: A | Discharge status: Home / Self Care | Payer: BC Managed Care – PPO | Source: Ambulatory Visit | Attending: Nephrology | Admitting: Nephrology

## 2019-12-14 DIAGNOSIS — N1831 Chronic kidney disease, stage 3a: Secondary | ICD-10-CM

## 2019-12-14 DIAGNOSIS — Q600 Renal agenesis, unilateral: Secondary | ICD-10-CM | POA: Diagnosis not present

## 2019-12-14 DIAGNOSIS — N183 Chronic kidney disease, stage 3 unspecified: Secondary | ICD-10-CM | POA: Diagnosis not present

## 2019-12-20 ENCOUNTER — Other Ambulatory Visit: Payer: Self-pay | Admitting: Rheumatology

## 2019-12-20 DIAGNOSIS — L4050 Arthropathic psoriasis, unspecified: Secondary | ICD-10-CM

## 2019-12-20 NOTE — Telephone Encounter (Signed)
Last Visit: 08/22/19 Next visit: 01/25/20 Labs: 12/01/2019 CBC and CMP are stable.  Current Dose per office note on 08/22/2019: Arava 20 mg p.o. daily  Okay to refill per Dr. Corliss Skains

## 2020-01-11 NOTE — Progress Notes (Deleted)
Office Visit Note  Patient: Sarah Horton             Date of Birth: Nov 07, 1950           MRN: [id number]             PCP: Michele Mcalpine, MD Referring: Michele Mcalpine, MD Visit Date: 01/25/2020 Occupation: @GUAROCC @  Subjective:  No chief complaint on file.   History of Present Illness: Sarah Horton is a 69 y.o. female ***   Activities of Daily Living:  Patient reports morning stiffness for *** {minute/hour:19697}.   Patient {ACTIONS;DENIES/REPORTS:21021675::"Denies"} nocturnal pain.  Difficulty dressing/grooming: {ACTIONS;DENIES/REPORTS:21021675::"Denies"} Difficulty climbing stairs: {ACTIONS;DENIES/REPORTS:21021675::"Denies"} Difficulty getting out of chair: {ACTIONS;DENIES/REPORTS:21021675::"Denies"} Difficulty using hands for taps, buttons, cutlery, and/or writing: {ACTIONS;DENIES/REPORTS:21021675::"Denies"}  No Rheumatology ROS completed.   PMFS History:  Patient Active Problem List   Diagnosis Date Noted  . History of peripheral neuropathy 11/20/2016  . Left wrist tendonitis/ history of surgery for Dequervains  11/20/2016  . Raynaud's disease without gangrene 09/22/2016  . High risk medication use 09/22/2016  . History of ulcer of lower limb/ Venous stasis ulcer 01/2015  09/22/2016  . Osteopenia of multiple sites 09/22/2016  . Psoriasis 09/03/2016  . Chest wall pain 07/16/2016  . Flank pain, acute 05/15/2016  . Subacromial bursitis of right shoulder joint 04/10/2016  . Low back pain 04/09/2016  . Pre-ulcerative calluses 02/11/2016  . Varicose veins of right lower extremity with complications 08/12/2015  . Elevated BP 08/09/2015  . Varicose veins of left lower extremity with complications 07/29/2015  . Varicose veins of bilateral lower extremities with other complications 07/09/2015  . Varicose veins of lower extremities with ulcer (HCC) 04/05/2015  . Status post bilateral knee replacements 12/13/2013  . Venous stasis ulcer of ankle (HCC)  06/06/2013  . ADD (attention deficit disorder) 04/04/2012  . INSOMNIA 06/20/2010  . Hypothyroidism 01/17/2010  . Obesity 01/17/2010  . Osteoarthritis 01/17/2010  . ANEMIA 10/02/2009  . SINUSITIS, ACUTE 10/02/2009  . Asthma 10/02/2009  . Venous (peripheral) insufficiency 03/11/2008  . Allergic rhinitis 03/11/2008  . BRONCHITIS, RECURRENT 03/11/2008  . History of migraine 03/11/2008  . Anxiety state 11/24/2007  . Hereditary and idiopathic peripheral neuropathy 11/24/2007  . GERD 11/24/2007  . IRRITABLE BOWEL SYNDROME 11/24/2007  . PSORIATIC ARTHROPATHY 11/24/2007    Past Medical History:  Diagnosis Date  . Allergic rhinitis, cause unspecified   . Allergy   . Anemia, unspecified   . Anxiety state, unspecified   . Arthritis   . Cataract    bilaterally removed 2003,2007  . Esophageal reflux   . Irritable bowel syndrome   . Migraine   . Obesity, unspecified   . Psoriatic arthropathy (HCC)   . Unspecified chronic bronchitis (HCC)   . Unspecified hereditary and idiopathic peripheral neuropathy   . Unspecified hypothyroidism   . Unspecified venous (peripheral) insufficiency     Family History  Problem Relation Age of Onset  . Cirrhosis Father   . COPD Mother   . Heart disease Mother   . Hypertension Mother   . Varicose Veins Mother   . Breast cancer Maternal Grandmother   . Breast cancer Other        1st cousin   . Pancreatic cancer Other        1st cousin   . Thyroid disease Sister   . Colon cancer Neg Hx   . Colon polyps Neg Hx   . Esophageal cancer Neg Hx   . Rectal cancer Neg Hx   .  Stomach cancer Neg Hx    Past Surgical History:  Procedure Laterality Date  . ABDOMINAL HYSTERECTOMY    . ABDOMINAL HYSTERECTOMY    . ABLATION ON ENDOMETRIOSIS     x2  . Bilat TKRs  04/2010   by [name]  . BUNIONECTOMY  2009  . CATARACT EXTRACTION, BILATERAL     [id number]  . COLONOSCOPY  08-26-2004   with gessner tics and hems   . dequervains tensenovitis Left   . JOINT  REPLACEMENT Bilateral    knees  . NASAL SINUS SURGERY    . SHOULDER SURGERY  2005  . TOTAL SHOULDER ARTHROPLASTY     Social History   Social History Narrative  . Not on file   Immunization History  Administered Date(s) Administered  . H1N1 05/18/2008  . Influenza Inj Mdck Quad Pf 04/01/2017  . Influenza Inj Mdck Quad With Preservative 04/13/2018  . Influenza Split 03/23/2011, 03/14/2012, 03/23/2013, 03/22/2016  . Influenza Whole 03/08/2008, 03/08/2009, 03/07/2010  . Influenza,inj,Quad PF,6+ Mos 03/21/2014, 03/31/2019  . Td 01/06/2002  . Tdap 03/14/2012     Objective: Vital Signs: There were no vitals taken for this visit.   Physical Exam   Musculoskeletal Exam: ***  CDAI Exam: CDAI Score: -- Patient Global: --; Provider Global: -- Swollen: --; Tender: -- Joint Exam 01/25/2020   No joint exam has been documented for this visit   There is currently no information documented on the homunculus. Go to the Rheumatology activity and complete the homunculus joint exam.  Investigation: No additional findings.  Imaging: US RENAL  Result Date: 12/15/2019 CLINICAL DATA:  Chronic kidney disease stage 3. EXAM: RENAL / URINARY TRACT ULTRASOUND COMPLETE COMPARISON:  None. FINDINGS: Right Kidney: Renal measurements: 8.3 x 3.5 x 5.4 cm = volume: 82 mL. Evaluation is limited due to overlying bowel gas, however there is mild renal cortical thinning. Echogenicity within normal limits. No mass or hydronephrosis visualized. Left Kidney: Renal measurements: 12.8 x 5.3 x 4.8 cm = volume: 167 mL. Echogenicity within normal limits. No mass or hydronephrosis visualized. Bladder: Appears normal for degree of bladder distention. Other: None. IMPRESSION: Mild cortical thinning of the right kidney. No hydronephrosis on either side. Electronically Signed   By: Romona Curls M.D.   On: 12/15/2019 11:55    Recent Labs: Lab Results  Component Value Date   WBC 5.6 12/01/2019   HGB 12.2 12/01/2019    PLT 176 12/01/2019   NA 140 12/01/2019   K 4.4 12/01/2019   CL 107 12/01/2019   CO2 24 12/01/2019   GLUCOSE 97 12/01/2019   BUN 15 12/01/2019   CREATININE 1.00 (H) 12/01/2019   BILITOT 0.5 12/01/2019   ALKPHOS 87 01/25/2017   AST 19 12/01/2019   ALT 16 12/01/2019   PROT 6.4 12/01/2019   ALBUMIN 4.1 01/25/2017   CALCIUM 8.9 12/01/2019   GFRAA 68 12/01/2019   QFTBGOLDPLUS NEGATIVE 12/01/2018    Speciality Comments: Prior therapy: Methotrexate (elevated creatinine)  Procedures:  No procedures performed Allergies: Nucynta [tapentadol], Codeine, and Penicillins   Assessment / Plan:     Visit Diagnoses: No diagnosis found.  Orders: No orders of the defined types were placed in this encounter.  No orders of the defined types were placed in this encounter.   Face-to-face time spent with patient was *** minutes. Greater than 50% of time was spent in counseling and coordination of care.  Follow-Up Instructions: No follow-ups on file.   Ellen Henri, CMA  Note - This record has  been created using [employer].  Chart creation errors have been sought, but may not always  have been located. Such creation errors do not reflect on  the standard of medical care.

## 2020-01-15 DIAGNOSIS — F422 Mixed obsessional thoughts and acts: Secondary | ICD-10-CM | POA: Diagnosis not present

## 2020-01-15 DIAGNOSIS — F331 Major depressive disorder, recurrent, moderate: Secondary | ICD-10-CM | POA: Diagnosis not present

## 2020-01-15 DIAGNOSIS — F411 Generalized anxiety disorder: Secondary | ICD-10-CM | POA: Diagnosis not present

## 2020-01-15 DIAGNOSIS — F902 Attention-deficit hyperactivity disorder, combined type: Secondary | ICD-10-CM | POA: Diagnosis not present

## 2020-01-22 ENCOUNTER — Other Ambulatory Visit: Payer: Self-pay | Admitting: Rheumatology

## 2020-01-22 DIAGNOSIS — L4050 Arthropathic psoriasis, unspecified: Secondary | ICD-10-CM

## 2020-01-22 NOTE — Telephone Encounter (Signed)
Last Visit: 08/22/19 Next visit: 01/25/20 Labs:12/01/2019 CBC and CMP are stable.  Current Dose per office note on 3/16/21Arava 10 mg 1 tablet daily  Okay to refill per Dr. Corliss Skains

## 2020-01-25 ENCOUNTER — Ambulatory Visit: Payer: BC Managed Care – PPO | Admitting: Rheumatology

## 2020-01-25 NOTE — Progress Notes (Signed)
Office Visit Note  Patient: Sarah Horton             Date of Birth: Dec 20, 1950           MRN: [id number]             PCP: Michele Mcalpine, MD Referring: Michele Mcalpine, MD Visit Date: 01/29/2020 Occupation: @GUAROCC @  Subjective:  Discuss medication options   History of Present Illness: Sarah Horton is a 69 y.o. female with history of psoriatic arthritis.  Patient is taking Arava 10 mg 1 tablet by mouth daily.  She previously discontinued Cosentyx due to insurance issues.  The plan was to switch her to Enbrel but there was a concern for venous ulcers on her lower extremities.  She was evaluated by vascular and has been wearing compression stockings and elevating her legs 4 times a day.  She does not have any open wounds or ulcers on her lower extremities.  She has not had any recent infections.  She would like to discuss restarting on Enbrel.  She states that she is currently having a flare in multiple joints including the right wrist, both hands, and both feet.  She has noticed swelling in her right wrist and right ankle.  She is also been experiencing Achilles tendinitis bilaterally.  She has ongoing SI joint pain and has had some increased discomfort in the midline of her spine.  She takes Robaxin as needed for pain relief and muscle spasms.  She requested a refill of Robaxin today. She has received both covid-19 vaccinations, and she has not decided if she would like to proceed with receiving the booster dose at this time.   Activities of Daily Living:  Patient reports morning stiffness for 2-3  hours.   Patient Reports nocturnal pain.  Difficulty dressing/grooming: Reports Difficulty climbing stairs: Reports Difficulty getting out of chair: Reports Difficulty using hands for taps, buttons, cutlery, and/or writing: Reports  Review of Systems  Constitutional: Positive for fatigue.  HENT: Positive for mouth dryness and nose dryness. Negative for mouth sores.   Eyes:  Negative for itching and dryness.  Respiratory: Negative for shortness of breath and difficulty breathing.   Cardiovascular: Negative for chest pain and palpitations.  Gastrointestinal: Negative for blood in stool, constipation and diarrhea.  Endocrine: Negative for increased urination.  Genitourinary: Negative for difficulty urinating.  Musculoskeletal: Positive for arthralgias, joint pain, joint swelling, myalgias, morning stiffness, muscle tenderness and myalgias.  Skin: Positive for color change. Negative for rash and redness.  Allergic/Immunologic: Negative for susceptible to infections.  Neurological: Positive for numbness. Negative for dizziness, headaches, memory loss and weakness.  Hematological: Positive for bruising/bleeding tendency.  Psychiatric/Behavioral: Negative for confusion.    PMFS History:  Patient Active Problem List   Diagnosis Date Noted   History of peripheral neuropathy 11/20/2016   Left wrist tendonitis/ history of surgery for Dequervains  11/20/2016   Raynaud's disease without gangrene 09/22/2016   High risk medication use 09/22/2016   History of ulcer of lower limb/ Venous stasis ulcer 01/2015  09/22/2016   Osteopenia of multiple sites 09/22/2016   Psoriasis 09/03/2016   Chest wall pain 07/16/2016   Flank pain, acute 05/15/2016   Subacromial bursitis of right shoulder joint 04/10/2016   Low back pain 04/09/2016   Pre-ulcerative calluses 02/11/2016   Varicose veins of right lower extremity with complications 08/12/2015   Elevated BP 08/09/2015   Varicose veins of left lower extremity with complications 07/29/2015   Varicose veins  of bilateral lower extremities with other complications 07/09/2015   Varicose veins of lower extremities with ulcer (HCC) 04/05/2015   Status post bilateral knee replacements 12/13/2013   Venous stasis ulcer of ankle (HCC) 06/06/2013   ADD (attention deficit disorder) 04/04/2012   INSOMNIA 06/20/2010    Hypothyroidism 01/17/2010   Obesity 01/17/2010   Osteoarthritis 01/17/2010   ANEMIA 10/02/2009   SINUSITIS, ACUTE 10/02/2009   Asthma 10/02/2009   Venous (peripheral) insufficiency 03/11/2008   Allergic rhinitis 03/11/2008   BRONCHITIS, RECURRENT 03/11/2008   History of migraine 03/11/2008   Anxiety state 11/24/2007   Hereditary and idiopathic peripheral neuropathy 11/24/2007   GERD 11/24/2007   IRRITABLE BOWEL SYNDROME 11/24/2007   PSORIATIC ARTHROPATHY 11/24/2007    Past Medical History:  Diagnosis Date   Allergic rhinitis, cause unspecified    Allergy    Anemia, unspecified    Anxiety state, unspecified    Arthritis    Cataract    bilaterally removed 2003,2007   Esophageal reflux    Irritable bowel syndrome    Migraine    Obesity, unspecified    Psoriatic arthropathy (HCC)    Unspecified chronic bronchitis (HCC)    Unspecified hereditary and idiopathic peripheral neuropathy    Unspecified hypothyroidism    Unspecified venous (peripheral) insufficiency     Family History  Problem Relation Age of Onset   Cirrhosis Father    COPD Mother    Heart disease Mother    Hypertension Mother    Varicose Veins Mother    Breast cancer Maternal Grandmother    Breast cancer Other        1st cousin    Pancreatic cancer Other        1st cousin    Thyroid disease Sister    Colon cancer Neg Hx    Colon polyps Neg Hx    Esophageal cancer Neg Hx    Rectal cancer Neg Hx    Stomach cancer Neg Hx    Past Surgical History:  Procedure Laterality Date   ABDOMINAL HYSTERECTOMY     ABDOMINAL HYSTERECTOMY     ABLATION ON ENDOMETRIOSIS     x2   Bilat TKRs  04/2010   by [name]   BUNIONECTOMY  2009   CATARACT EXTRACTION, BILATERAL     2003,2007   COLONOSCOPY  08-26-2004   with gessner tics and hems    dequervains tensenovitis Left    JOINT REPLACEMENT Bilateral    knees   NASAL SINUS SURGERY     SHOULDER SURGERY  2005    TOTAL SHOULDER ARTHROPLASTY     Social History   Social History Narrative   Not on file   Immunization History  Administered Date(s) Administered   H1N1 05/18/2008   Influenza Inj Mdck Quad Pf 04/01/2017   Influenza Inj Mdck Quad With Preservative 04/13/2018   Influenza Split 03/23/2011, 03/14/2012, 03/23/2013, 03/22/2016   Influenza Whole 03/08/2008, 03/08/2009, 03/07/2010   Influenza,inj,Quad PF,6+ Mos 03/21/2014, 03/31/2019   PFIZER SARS-COV-2 Vaccination 08/22/2019, 09/12/2019   Td 01/06/2002   Tdap 03/14/2012     Objective: Vital Signs: BP (!) 158/93 (BP Location: Left Arm, Patient Position: Sitting, Cuff Size: Normal)    Pulse 99    Resp 16    Ht 5' 7.75" (1.721 m)    Wt 236 lb (107 kg)    BMI 36.15 kg/m    Physical Exam Vitals and nursing note reviewed.  Constitutional:      Appearance: She is well-developed.  HENT:  Head: Normocephalic and atraumatic.  Eyes:     Conjunctiva/sclera: Conjunctivae normal.  Pulmonary:     Effort: Pulmonary effort is normal.  Abdominal:     Palpations: Abdomen is soft.  Musculoskeletal:     Cervical back: Normal range of motion.  Skin:    General: Skin is warm and dry.     Capillary Refill: Capillary refill takes less than 2 seconds.     Comments: Patch of psoriasis on the extensor surface of the left elbow.  Small scattered patches of psoriasis noted on upper extremities.   Neurological:     Mental Status: She is alert and oriented to person, place, and time.  Psychiatric:        Behavior: Behavior normal.      Musculoskeletal Exam: C-spine has good range of motion with no discomfort.  She has painful range of motion of the lumbar spine.  Shoulder joints have good range of motion with some discomfort in the right shoulder.  Elbow joints have good range of motion with no tenderness or inflammation.  She has psoriasis on the extensor surface of the left elbow.  She has tenderness and mild inflammation of the right wrist  joint.  Tenderness and synovitis of the right first, second, and fifth MCP joints noted.  Tenderness and synovitis of the right first PIP and second DIP and fourth DIP joint.  Tenderness and synovitis of the left first and second PIP joints noted.  Bilateral knee replacements have good range of motion with no discomfort.  She has tenderness and warmth on the medial aspect of the right ankle.  Achilles enthesitis of the right ankle noted. Tenderness and inflammation in the left 1st PIP joint.   CDAI Exam: CDAI Score: 15.2  Patient Global: 6 mm; Provider Global: 6 mm Swollen: 12 ; Tender: 12  Joint Exam 01/29/2020      Right  Left  Wrist  Swollen Tender     MCP 1  Swollen Tender     MCP 2  Swollen Tender     MCP 5  Swollen Tender     IP  Swollen Tender  Swollen Tender  PIP 2     Swollen Tender  DIP 3  Swollen Tender     DIP 5  Swollen Tender     Ankle  Swollen Tender     MTP 1     Swollen Tender  PIP 1 (toe)     Swollen Tender     Investigation: No additional findings.  Imaging: No results found.  Recent Labs: Lab Results  Component Value Date   WBC 5.6 12/01/2019   HGB 12.2 12/01/2019   PLT 176 12/01/2019   NA 140 12/01/2019   K 4.4 12/01/2019   CL 107 12/01/2019   CO2 24 12/01/2019   GLUCOSE 97 12/01/2019   BUN 15 12/01/2019   CREATININE 1.00 (H) 12/01/2019   BILITOT 0.5 12/01/2019   ALKPHOS 87 01/25/2017   AST 19 12/01/2019   ALT 16 12/01/2019   PROT 6.4 12/01/2019   ALBUMIN 4.1 01/25/2017   CALCIUM 8.9 12/01/2019   GFRAA 68 12/01/2019   QFTBGOLDPLUS NEGATIVE 12/01/2018    Speciality Comments: Prior therapy: Methotrexate (elevated creatinine)  Procedures:  No procedures performed Allergies: Nucynta [tapentadol], Codeine, and Penicillins   Assessment / Plan:     Visit Diagnoses: Psoriatic arthropathy (HCC) - She presents today with tenderness and inflammation in multiple joints as described above.  She has significant synovitis in several MCPs, PIPs,  and  DIP joints.  Achilles enthesitis of the right ankle noted.  She has tenderness and dactylitis of the left great toe.  She has been experiencing increased pain, morning stiffness, and nocturnal pain while being on Arava 10 mg 1 tablet by mouth daily as monotherapy.  She previously discontinued Cosentyx due to insurance issues.  The plan was to restart her on Enbrel but at that time she was experiencing venous stasis and has a history of venous ulcerations.  She was evaluated by Dr. Edilia Bo (vascular surgery) on 11/02/19.  No ulcerations or open wounds noted today.  She has not had any recent infections.  She would like to restarting on Enbrel 50 mg sq injections once weekly. Indications, contraindications, and potential side effects of Enbrel were discussed today.  We will reapply for Enbrel and schedule a nurse visit to administer the injection in the office.  She will continue taking Arava 10 mg 1 tablet by mouth daily.  She declined a prednisone taper at this time.  She will follow up in 6 weeks to assess her response to combination therapy.   Medication counseling:   TB Test: pending  Hepatitis panel: Negative 08/23/15 HIV: Negative on 10/26/17 SPEP: WNL on 10/26/17 Immunoglobulins: WNL 10/26/17   Chest x-ray: No acute cardiopulmonary disease on 06/18/16   Does patient have diagnosis of heart failure?  No  Counseled patient that Enbrel is a TNF blocking agent.  Reviewed Enbrel dose of 50 mg once weekly.  Counseled patient on purpose, proper use, and adverse effects of Enbrel.  Reviewed the most common adverse effects including infections, headache, and injection site reactions. Discussed that there is the possibility of an increased risk of malignancy but it is not well understood if this increased risk is due to the medication or the disease state.  Advised patient to get yearly dermatology exams due to risk of skin cancer.  Reviewed the importance of regular labs while on Enbrel therapy.  Advised patient  to get standing labs one month after starting Enbrel then every 2 months.  Provided patient with standing lab orders.  Counseled patient that Enbrel should be held prior to scheduled surgery.  Counseled patient to avoid live vaccines while on Enbrel.  Advised patient to get annual influenza vaccine and the pneumococcal vaccine as needed.  Provided patient with medication education material and answered all questions.  Patient voiced understanding.  Patient consented to Enbrel.  Will upload consent into the media tab.  Reviewed storage instructions for Enbrel.  Advised initial injection must be administered in office.  Patient voiced understanding.   Psoriasis: She has a patch of psoriasis on the extensor surface of her left elbow.  Scattered patches of psoriasis noted on her upper extremities.  No recurrence of pustular psoriasis noted.  She uses triamcinolone cream topically as needed.  She declined a refill at this time.  She continues to follow-up with her dermatologist.  She will be restarting on Enbrel 50 mg subcutaneous injections once weekly.  High risk medication use - Arava 10 mg 1 tablet by mouth daily.  She will be restarting on Enbrel 50 mg subcutaneous injections once weekly.  Once her lab work has resulted she will return for nurse visit for the administration of the injection.  She is aware that she will have to wait 30 minutes to be monitored for possible allergic reaction.  She was previously on Enbrel and otezla, Cosentyx (started on November 16, 2017-December 2020) discontinued Cosentyx due to insurance issues.  Plan prefers Enbrel. CBC and CMP will be updated today.  TB gold is also due.  We will continue to monitor TB Gold on a yearly basis.  She will require updated CBC and CMP in 1 month and every 3 months to monitor for drug toxicity.  Standing orders for CBC and CMP are in place.- Plan: QuantiFERON-TB Gold Plus, COMPLETE METABOLIC PANEL WITH GFR, CBC with Differential/Platelet  She has  received both COVID-19 vaccinations.  She was encouraged to receive the booster dose.  We discussed that ACR does not recommend holding Enbrel or Arava prior to or after the booster dose.  She was advised to hold her Arava and Enbrel if she develops any signs or symptoms of an infection and to resume once the infection has completely cleared.  She was also advised to notify us or her PCP if she develops a COVID-19 infection in order to receive the antibody infusion.  She voiced understanding.  We discussed the importance of following up with her dermatologist on a yearly basis for skin checks while she is on Enbrel.  Sacroiliitis [facility]): She continues to experience intermittent SI joint pain.  Status post bilateral knee replacements: She has good range of motion of both knee joints which are replaced.  No joint inflammation was noted on exam.  Raynaud's disease without gangrene: Not currently active.  No digital ulcerations or signs of gangrene noted.   History of ulcer of lower limb/ Venous stasis ulcer 01/2015: Resolved.  She wears compression stockings daily and elevates her LEs 4 times daily.  She had an appointment with Dr. Edilia Bo (vascular surgery) on 11/02/19.  No evidence of DVT on venous duplex performed.  She has not had any recurrence of venous ulcerations.  No open wounds noted on exam.   Osteopenia of multiple sites: DEXA ordered by PCP. She is taking a calcium and vitamin D supplement as recommended.    Other medical conditions are listed as follows:   Left wrist tendonitis/ history of surgery for Dequervains   Primary insomnia  History of hypertension  History of peripheral neuropathy  History of migraine  History of IBS  History of asthma  History of gastroesophageal reflux (GERD)  History of hypothyroidism  Screening for tuberculosis - Plan: QuantiFERON-TB Gold Plus  Orders: Orders Placed This Encounter  Procedures   QuantiFERON-TB Gold Plus   COMPLETE  METABOLIC PANEL WITH GFR   CBC with Differential/Platelet   Meds ordered this encounter  Medications   methocarbamol (ROBAXIN) 500 MG tablet    Sig: Take 1 tablet (500 mg total) by mouth 3 (three) times daily as needed.    Dispense:  90 tablet    Refill:  0    Face-to-face time spent with patient was 30 minutes. Greater than 50% of time was spent in counseling and coordination of care.  Follow-Up Instructions: Return in about 6 weeks (around 03/11/2020) for Psoriatic arthritis.   Gearldine Bienenstock, PA-C  Note - This record has been created using Dragon software.  Chart creation errors have been sought, but may not always  have been located. Such creation errors do not reflect on  the standard of medical care.

## 2020-01-29 ENCOUNTER — Other Ambulatory Visit: Payer: Self-pay

## 2020-01-29 ENCOUNTER — Encounter: Payer: Self-pay | Admitting: Physician Assistant

## 2020-01-29 ENCOUNTER — Telehealth: Payer: Self-pay

## 2020-01-29 ENCOUNTER — Ambulatory Visit (INDEPENDENT_AMBULATORY_CARE_PROVIDER_SITE_OTHER): Payer: BC Managed Care – PPO | Admitting: Physician Assistant

## 2020-01-29 VITALS — BP 158/93 | HR 99 | Resp 16 | Ht 67.75 in | Wt 236.0 lb

## 2020-01-29 DIAGNOSIS — Z8709 Personal history of other diseases of the respiratory system: Secondary | ICD-10-CM

## 2020-01-29 DIAGNOSIS — Z872 Personal history of diseases of the skin and subcutaneous tissue: Secondary | ICD-10-CM

## 2020-01-29 DIAGNOSIS — Z8669 Personal history of other diseases of the nervous system and sense organs: Secondary | ICD-10-CM

## 2020-01-29 DIAGNOSIS — Z79899 Other long term (current) drug therapy: Secondary | ICD-10-CM

## 2020-01-29 DIAGNOSIS — Z8719 Personal history of other diseases of the digestive system: Secondary | ICD-10-CM

## 2020-01-29 DIAGNOSIS — Z8639 Personal history of other endocrine, nutritional and metabolic disease: Secondary | ICD-10-CM

## 2020-01-29 DIAGNOSIS — Z111 Encounter for screening for respiratory tuberculosis: Secondary | ICD-10-CM | POA: Diagnosis not present

## 2020-01-29 DIAGNOSIS — M461 Sacroiliitis, not elsewhere classified: Secondary | ICD-10-CM

## 2020-01-29 DIAGNOSIS — L409 Psoriasis, unspecified: Secondary | ICD-10-CM | POA: Diagnosis not present

## 2020-01-29 DIAGNOSIS — I7300 Raynaud's syndrome without gangrene: Secondary | ICD-10-CM

## 2020-01-29 DIAGNOSIS — L4050 Arthropathic psoriasis, unspecified: Secondary | ICD-10-CM | POA: Diagnosis not present

## 2020-01-29 DIAGNOSIS — Z96653 Presence of artificial knee joint, bilateral: Secondary | ICD-10-CM

## 2020-01-29 DIAGNOSIS — M778 Other enthesopathies, not elsewhere classified: Secondary | ICD-10-CM

## 2020-01-29 DIAGNOSIS — Z8679 Personal history of other diseases of the circulatory system: Secondary | ICD-10-CM

## 2020-01-29 DIAGNOSIS — M8589 Other specified disorders of bone density and structure, multiple sites: Secondary | ICD-10-CM

## 2020-01-29 DIAGNOSIS — F5101 Primary insomnia: Secondary | ICD-10-CM

## 2020-01-29 MED ORDER — METHOCARBAMOL 500 MG PO TABS: 500.0000 mg | ORAL_TABLET | Freq: Three times a day (TID) | ORAL | 0 refills | Status: DC | PRN

## 2020-01-29 NOTE — Telephone Encounter (Signed)
Please apply for enbrel, per Taylor Dale, PA-C. Thanks!  ° °Consent obtained and sent to the scan center.  °

## 2020-01-29 NOTE — Patient Instructions (Addendum)
Standing Labs °We placed an order today for your standing lab work.  ° °Please have your standing labs drawn in 1 month then every 3 months  ° °If possible, please have your labs drawn 2 weeks prior to your appointment so that the provider can discuss your results at your appointment. ° °We have open lab daily °Monday through Thursday from 8:30-12:30 PM and 1:30-4:30 PM and Friday from 8:30-12:30 PM and 1:30-4:00 PM °at the office of Dr. Shaili Deveshwar, [facility].   °Please be advised, patients with office appointments requiring lab work will take precedents over walk-in lab work.  °If possible, please come for your lab work on Monday and Friday afternoons, as you may experience shorter wait times. °The office is located at [address], [address], [place], [place] [address] °No appointment is necessary.   °Labs are drawn by [facility]. Please bring your co-pay at the time of your lab draw.  You may receive a bill from [facility] for your lab work. ° °If you wish to have your labs drawn at another location, please call the office 24 hours in advance to send orders. ° °If you have any questions regarding directions or hours of operation,  °please call [phone].   °As a reminder, please drink plenty of water prior to coming for your lab work. Thanks! °COVID-19 vaccine recommendations:  ° °COVID-19 vaccine is recommended for everyone (unless you are allergic to a vaccine component), even if you are on a medication that suppresses your immune system.  ° °If you are on Methotrexate, Cellcept (mycophenolate), Rinvoq, Xeljanz, and Olumiant- hold the medication for 1 week after each vaccine. Hold Methotrexate for 2 weeks after the single dose COVID-19 vaccine.  ° °If you are on Orencia subcutaneous injection - hold medication one week prior to and one week after the first COVID-19 vaccine dose (only).  ° °If you are on Orencia IV infusions- time vaccination administration so that the first COVID-19 vaccination  will occur four weeks after the infusion and postpone the subsequent infusion by one week.  ° °If you are on Cyclophosphamide or Rituxan infusions please contact your doctor prior to receiving the COVID-19 vaccine.  ° °Do not take Tylenol or any anti-inflammatory medications (NSAIDs) 24 hours prior to the COVID-19 vaccination.  ° °There is no direct evidence about the efficacy of the COVID-19 vaccine in individuals who are on medications that suppress the immune system.  ° °Even if you are fully vaccinated, and you are on any medications that suppress your immune system, please continue to wear a mask, maintain at least six feet social distance and practice hand hygiene.  ° °If you develop a COVID-19 infection, please contact your PCP or our office to determine if you need antibody infusion. ° °The booster vaccine is now available for immunocompromised patients. It is advised that if you had Pfizer vaccine you should get Pfizer booster.  If you had a Moderna vaccine then you should get a Moderna booster. Johnson and Johnson does not have a booster vaccine at this time. ° °Please see the following web sites for updated information.  ° °https://[email or web address].org/Portals/0/Files/COVID-19-Vaccination-Patient-Resources.pdf ° °[email or web address]://[email or web address].org/About-Us/Newsroom/Press-Releases/ID/1159 ° ° °Etanercept injection °What is this medicine? °ETANERCEPT (et a NER sept) is used for the treatment of rheumatoid arthritis in adults and children. The medicine is also used to treat psoriatic arthritis, ankylosing spondylitis, and psoriasis. °This medicine may be used for other purposes; ask your health care provider or pharmacist if you have questions. °COMMON BRAND NAME(S):   Enbrel °What should I tell my health care provider before I take this medicine? °They need to know if you have any of these conditions: °· blood disorders °· cancer °· congestive heart failure °· diabetes °· exposure to chickenpox °· immune system  problems °· infection °· multiple sclerosis °· seizure disorder °· tuberculosis, a positive skin test for tuberculosis or have recently been in close contact with someone who has tuberculosis °· Wegener's granulomatosis °· an unusual or allergic reaction to etanercept, latex, other medicines, foods, dyes, or preservatives °· pregnant or trying to get pregnant °· breast-feeding °How should I use this medicine? °The medicine is given by injection under the skin. You will be taught how to prepare and give this medicine. Use exactly as directed. Take your medicine at regular intervals. Do not take your medicine more often than directed. °It is important that you put your used needles and syringes in a special sharps container. Do not put them in a trash can. If you do not have a sharps container, call your pharmacist or healthcare provider to get one. °A special MedGuide will be given to you by the pharmacist with each prescription and refill. Be sure to read this information carefully each time. °Talk to your pediatrician regarding the use of this medicine in children. While this drug may be prescribed for children as young as 4 years of age for selected conditions, precautions do apply. °Overdosage: If you think you have taken too much of this medicine contact a poison control center or emergency room at once. °NOTE: This medicine is only for you. Do not share this medicine with others. °What if I miss a dose? °If you miss a dose, contact your health care professional to find out when you should take your next dose. Do not take double or extra doses without advice. °What may interact with this medicine? °Do not take this medicine with any of the following medications: °· anakinra °This medicine may also interact with the following medications: °· cyclophosphamide °· sulfasalazine °· vaccines °This list may not describe all possible interactions. Give your health care provider a list of all the medicines, herbs,  non-prescription drugs, or dietary supplements you use. Also tell them if you smoke, drink alcohol, or use illegal drugs. Some items may interact with your medicine. °What should I watch for while using this medicine? °Tell your doctor or healthcare professional if your symptoms do not start to get better or if they get worse. °You will be tested for tuberculosis (TB) before you start this medicine. If your doctor prescribes any medicine for TB, you should start taking the TB medicine before starting this medicine. Make sure to finish the full course of TB medicine. °Call your doctor or health care professional for advice if you get a fever, chills or sore throat, or other symptoms of a cold or flu. Do not treat yourself. This drug decreases your body's ability to fight infections. Try to avoid being around people who are sick. °What side effects may I notice from receiving this medicine? °Side effects that you should report to your doctor or health care professional as soon as possible: °· allergic reactions like skin rash, itching or hives, swelling of the face, lips, or tongue °· changes in vision °· fever, chills or any other sign of infection °· numbness or tingling in legs or other parts of the body °· red, scaly patches or raised bumps on the skin °· shortness of breath or difficulty breathing °·   swollen lymph nodes in the neck, underarm, or groin areas °· unexplained weight loss °· unusual bleeding or bruising °· unusual swelling or fluid retention in the legs °· unusually weak or tired °Side effects that usually do not require medical attention (report to your doctor or health care professional if they continue or are bothersome): °· dizziness °· headache °· nausea °· redness, itching, or swelling at the injection site °· vomiting °This list may not describe all possible side effects. Call your doctor for medical advice about side effects. You may report side effects to FDA at 1-800-FDA-1088. °Where should  I keep my medicine? °Keep out of the reach of children. °Enbrel products: °Store unopened Enbrel vials, cartridges, or pens in a refrigerator between 2 and 8 degrees C (36 and 46 degrees F). Do not freeze. Do not shake. Keep in the original container to protect from light. Throw away any unopened and unused medicine that has been stored in the refrigerator after the expiration date. °If needed, you may store an Enbrel single-use vial, single-dose prefilled syringe, SureClick autoinjector, or Enbrel Mini cartridge at room temperature between 20 to 25 degrees C (68 to 77 degrees F) for up to 14 days. Protect from light, heat, and moisture. Do not shake. Once any of these items are stored at room temperature, do not place them back into the refrigerator. Throw the item away after 14 days, even if it still contains medicine. °If you are using the Enbrel multi-dose vials, you will be instructed on how to dilute and store this medicine once it has been diluted. °The Enbrel AutoTouch reusable autoinjector device should be stored at room temperature. Do NOT refrigerate the AutoTouch reusable autoinjector. °Erelzi products: °Store Erelzi prefilled syringes or Sensoready pen in the refrigerator between 2 and 8 degrees C (36 and 46 degrees F). Do not freeze. Do not shake. Keep in the original container to protect from light. Throw away any unopened and unused medicine that has been stored in the refrigerator after the expiration date. °If needed, you may store the Erelzi prefilled syringe or pen at room temperature between 20 to 25 degrees C (68 to 77 degrees F) for up to 28 days. Protect from light, heat, and moisture. Do not shake. Once any of these items are stored at room temperature, do not place them back into the refrigerator. Throw the item away after 28 days, even if it still contains medicine. °NOTE: This sheet is a summary. It may not cover all possible information. If you have questions about this medicine, talk to  your doctor, pharmacist, or health care provider. °© 2020 Elsevier/Gold Standard (2018-08-22 09:49:58) ° ° ° °

## 2020-01-30 NOTE — Telephone Encounter (Signed)
Submitted a Prior Authorization request to [employer] for ENBREL via Cover My Meds.  CMM stated that an active PA is already on file with expiration date of 06/07/2098. Please wait to resubmit request within 60 days of that expiration date to obtain a PA renewal.  PA previously processed in December 2020 and was approved from 05/24/2019 till 06/07/2098.   PA # [id number]  Patient can be scheduled for new start visit.   Verlin Fester, PharmD, [place], CPP Clinical Specialty Pharmacist (Rheumatology and Pulmonology)  01/30/2020 10:03 AM

## 2020-01-30 NOTE — Telephone Encounter (Signed)
LMOM for patient to call and schedule ENBREL new start appointment with Amber.

## 2020-01-30 NOTE — Progress Notes (Signed)
Hgb is borderline low-11.6 and MCH borderline low. Rest of CBC is WNL.  Please notify the patient.  CMP WNL.  We will continue to monitor.   Please advise the patient to return 1 month after starting on enbrel to recheck lab work.  Once TB gold has resulted please call the patient to schedule a nurse visit for administration of the first injection.

## 2020-01-31 LAB — COMPLETE METABOLIC PANEL WITH GFR
AG Ratio: 1.4 (calc) (ref 1.0–2.5)
ALT: 16 U/L (ref 6–29)
AST: 21 U/L (ref 10–35)
Albumin: 3.9 g/dL (ref 3.6–5.1)
Alkaline phosphatase (APISO): 101 U/L (ref 37–153)
BUN: 17 mg/dL (ref 7–25)
CO2: 25 mmol/L (ref 20–32)
Calcium: 9 mg/dL (ref 8.6–10.4)
Chloride: 106 mmol/L (ref 98–110)
Creat: 0.97 mg/dL (ref 0.50–0.99)
GFR, Est African American: 71 mL/min/{1.73_m2} (ref >=60)
GFR, Est Non African American: 61 mL/min/{1.73_m2} (ref >=60)
Globulin: 2.8 g/dL (calc) (ref 1.9–3.7)
Glucose, Bld: 91 mg/dL (ref 65–99)
Potassium: 4.4 mmol/L (ref 3.5–5.3)
Sodium: 139 mmol/L (ref 135–146)
Total Bilirubin: 0.4 mg/dL (ref 0.2–1.2)
Total Protein: 6.7 g/dL (ref 6.1–8.1)

## 2020-01-31 LAB — QUANTIFERON-TB GOLD PLUS
Mitogen-NIL: 6.41 IU/mL
NIL: 0.02 IU/mL
QuantiFERON-TB Gold Plus: NEGATIVE
TB1-NIL: 0 IU/mL
TB2-NIL: 0 IU/mL

## 2020-01-31 LAB — CBC WITH DIFFERENTIAL/PLATELET
Absolute Monocytes: 737 cells/uL (ref 200–950)
Basophils Absolute: 38 cells/uL (ref 0–200)
Basophils Relative: 0.6 %
Eosinophils Absolute: 139 cells/uL (ref 15–500)
Eosinophils Relative: 2.2 %
HCT: 35.3 % (ref 35.0–45.0)
Hemoglobin: 11.6 g/dL — ABNORMAL LOW (ref 11.7–15.5)
Lymphs Abs: 2098 cells/uL (ref 850–3900)
MCH: 26.5 pg — ABNORMAL LOW (ref 27.0–33.0)
MCHC: 32.9 g/dL (ref 32.0–36.0)
MCV: 80.8 fL (ref 80.0–100.0)
MPV: 11 fL (ref 7.5–12.5)
Monocytes Relative: 11.7 %
Neutro Abs: 3289 cells/uL (ref 1500–7800)
Neutrophils Relative %: 52.2 %
Platelets: 212 10*3/uL (ref 140–400)
RBC: 4.37 10*6/uL (ref 3.80–5.10)
RDW: 14.2 % (ref 11.0–15.0)
Total Lymphocyte: 33.3 %
WBC: 6.3 10*3/uL (ref 3.8–10.8)

## 2020-02-01 NOTE — Progress Notes (Signed)
TB gold negative

## 2020-02-06 ENCOUNTER — Ambulatory Visit: Payer: BC Managed Care – PPO

## 2020-02-08 ENCOUNTER — Other Ambulatory Visit: Payer: Self-pay

## 2020-02-08 ENCOUNTER — Other Ambulatory Visit: Payer: Self-pay | Admitting: Pharmacist

## 2020-02-08 ENCOUNTER — Telehealth: Payer: Self-pay | Admitting: Rheumatology

## 2020-02-08 ENCOUNTER — Ambulatory Visit (INDEPENDENT_AMBULATORY_CARE_PROVIDER_SITE_OTHER): Payer: BC Managed Care – PPO | Admitting: Pharmacist

## 2020-02-08 VITALS — BP 160/103 | HR 100

## 2020-02-08 DIAGNOSIS — L4050 Arthropathic psoriasis, unspecified: Secondary | ICD-10-CM

## 2020-02-08 MED ORDER — ENBREL MINI 50 MG/ML ~~LOC~~ SOCT: 50.0000 mg | SUBCUTANEOUS | 0 refills | Status: DC

## 2020-02-08 NOTE — Telephone Encounter (Signed)
Patient called stating she talked to the pharmacist at [facility] and was told they received her Enbrel prescription.  Patient states she contacted [employer] and was told that [facility] is not on the list of approved providers for the medication.  Patient was informed that the prescription needs to be sent to [facility].  Patient requested a return call.

## 2020-02-08 NOTE — Patient Instructions (Signed)
Standing Labs °We placed an order today for your standing lab work.  ° °Please have your standing labs drawn in 1 month and then every 3 months ° °If possible, please have your labs drawn 2 weeks prior to your appointment so that the provider can discuss your results at your appointment. ° °We have open lab daily °Monday through Thursday from 8:30-12:30 PM and 1:30-4:30 PM and Friday from 8:30-12:30 PM and 1:30-4:00 PM °at the office of Dr. Shaili Deveshwar, [facility].   °Please be advised, patients with office appointments requiring lab work will take precedents over walk-in lab work.  °If possible, please come for your lab work on Monday and Friday afternoons, as you may experience shorter wait times. °The office is located at [address], [address], [place], [place] [address] °No appointment is necessary.   °Labs are drawn by [facility]. Please bring your co-pay at the time of your lab draw.  You may receive a bill from [facility] for your lab work. ° °If you wish to have your labs drawn at another location, please call the office 24 hours in advance to send orders. ° °If you have any questions regarding directions or hours of operation,  °please call [phone].   °As a reminder, please drink plenty of water prior to coming for your lab work. Thanks! ° °

## 2020-02-08 NOTE — Telephone Encounter (Signed)
Returned patient's call and had to leave a message. Advised that [facility] has a newer contract with [employer], and that test claim was processed and her insurance did pay. Advised her to call back if she would not like to proceed with [facility].  But if she would, they are just awaiting her copay card.

## 2020-02-08 NOTE — Progress Notes (Signed)
Pharmacy Note  Subjective:   Patient presents to clinic today to receive first dose of Enbrel.  She has been on Enbrel sureclick in the past without issue.  Patient running a fever or have signs/symptoms of infection? No  Patient currently on antibiotics for the treatment of infection? No  Patient have any upcoming invasive procedures/surgeries? No  Objective: CMP     Component Value Date/Time   NA 139 01/29/2020 1544   NA 142 09/08/2016 1648   K 4.4 01/29/2020 1544   CL 106 01/29/2020 1544   CO2 25 01/29/2020 1544   GLUCOSE 91 01/29/2020 1544   GLUCOSE 90 04/16/2006 1125   BUN 17 01/29/2020 1544   BUN 14 09/08/2016 1648   CREATININE 0.97 01/29/2020 1544   CALCIUM 9.0 01/29/2020 1544   PROT 6.7 01/29/2020 1544   PROT 6.7 09/08/2016 1648   ALBUMIN 4.1 01/25/2017 1617   ALBUMIN 4.2 09/08/2016 1648   AST 21 01/29/2020 1544   ALT 16 01/29/2020 1544   ALKPHOS 87 01/25/2017 1617   BILITOT 0.4 01/29/2020 1544   BILITOT 0.3 09/08/2016 1648   GFRNONAA 61 01/29/2020 1544   GFRAA 71 01/29/2020 1544    CBC    Component Value Date/Time   WBC 6.3 01/29/2020 1544   RBC 4.37 01/29/2020 1544   HGB 11.6 (L) 01/29/2020 1544   HGB 12.4 09/08/2016 1648   HCT 35.3 01/29/2020 1544   HCT 37.7 09/08/2016 1648   PLT 212 01/29/2020 1544   PLT 246 09/08/2016 1648   MCV 80.8 01/29/2020 1544   MCV 84 09/08/2016 1648   MCH 26.5 (L) 01/29/2020 1544   MCHC 32.9 01/29/2020 1544   RDW 14.2 01/29/2020 1544   RDW 15.5 (H) 09/08/2016 1648   LYMPHSABS 2,098 01/29/2020 1544   LYMPHSABS 2.5 09/08/2016 1648   MONOABS 736 01/25/2017 1617   EOSABS 139 01/29/2020 1544   EOSABS 0.1 09/08/2016 1648   BASOSABS 38 01/29/2020 1544   BASOSABS 0.0 09/08/2016 1648    Baseline Immunosuppressant Therapy Labs TB GOLD Quantiferon TB Gold Latest Ref Rng & Units 01/29/2020  Quantiferon TB Gold Plus NEGATIVE NEGATIVE   Hepatitis Panel Negative 08/23/2015  HIV Lab Results  Component Value Date   HIV  NON-REACTIVE 10/26/2017   Immunoglobulins Immunoglobulin Electrophoresis Latest Ref Rng & Units 10/26/2017  IgA  81 - 463 mg/dL 465  IgG 681 - 2,751 mg/dL 700  IgM 48 - 174 mg/dL 71   SPEP Serum Protein Electrophoresis Latest Ref Rng & Units 01/29/2020  Total Protein 6.1 - 8.1 g/dL 6.7  Albumin 3.8 - 4.8 g/dL -  Alpha-1 0.2 - 0.3 g/dL -  Alpha-2 0.5 - 0.9 g/dL -  Beta Globulin 0.4 - 0.6 g/dL -  Beta 2 0.2 - 0.5 g/dL -  Gamma Globulin 0.8 - 1.7 g/dL -   B4WH No results found for: G6PDH TPMT No results found for: TPMT   Chest x-ray: There is no acute cardiopulmonary disease. 06/18/2016.  Assessment/Plan:  Demonstrated proper injection technique with Enbrel Mini demo pen.  Patient able to demonstrate proper injection technique using the teach back method.  Patient self injected in the left anterior thigh with:  Sample Medication: Enbrel Sureclick 50 mg/ml NDC: 67591-638-46 Lot: [id number] Expiration: 04/23  Patient tolerated well.  Observed for 30 mins in office for adverse reaction and none noted    Patient is to return in 1 month for labs.  Standing orders placed. Prescription sent to [facility] per patient request.  All questions encouraged  and answered.  Instructed patient to call with any further questions or concerns.  Verlin Fester, PharmD, [facility] Clinical Pharmacist  02/08/2020 10:20 AM

## 2020-02-19 MED FILL — ENBREL MINI 50 MG/ML SOCT: 50 | 28 days supply | Qty: 4 | Fill #0

## 2020-02-27 NOTE — Progress Notes (Signed)
Office Visit Note  Patient: Sarah Horton             Date of Birth: [birth year]           MRN: [id number]             PCP: Michele Mcalpine, MD Referring: Michele Mcalpine, MD Visit Date: 03/11/2020 Occupation: @GUAROCC @  Subjective:  Fall yesterday  History of Present Illness: GLYN ZENDEJAS is a 69 y.o. female with history of psoriatic arthritis.  She is on Enbrel 50 mg sq injections once weekly and Arava 10 mg 1 tablet by mouth once daily.  She was restarted on Enbrel on 02/08/20.  She has noticed a significant clinical improvement since resuming Enbrel injections once weekly.  She reports that after her 1st injection her joint pain and stiffness started to improve.  She states that her right shoulder pain has resolved.  She is not experiencing any SI joint pain or tenderness at this time.  She denies any Achilles tendinitis or plantar fasciitis.  She denies any joint swelling currently.  She states that last night she fell in her garage and landed on her left knee which is replaced.  She states that she is currently experiencing discomfort in her left great toe and feels as though she may have hyperextended it and is worried about a possible fracture.  She reports in the past she fractured her right great toe but decided to avoid surgery at that time.  She is not experiencing any discomfort in her right foot currently.  Patient reports that she has started to notice increased discomfort in her thoracic spine recently.  She describes the pain as bandlike.  She denies any increase in muscle spasms recently.  She denies any radiating pain.  She takes robaxin 500 mg TID PRN for muscle spasms.  She has noticed increased muscle deconditioning over the past several months and would like to go to physical therapy for strengthening and stretching exercises.     Activities of Daily Living:  Patient reports morning stiffness for 15-20  minutes.   Patient Denies nocturnal pain.  Difficulty  dressing/grooming: Denies Difficulty climbing stairs: Denies Difficulty getting out of chair: Denies Difficulty using hands for taps, buttons, cutlery, and/or writing: Reports  Review of Systems  Constitutional: Negative for fatigue.  HENT: Positive for nose dryness. Negative for mouth sores and mouth dryness.        Nose sores   Eyes: Negative for pain, visual disturbance and dryness.  Respiratory: Negative for cough, hemoptysis, shortness of breath and difficulty breathing.   Cardiovascular: Negative for chest pain, palpitations, hypertension and swelling in legs/feet.  Gastrointestinal: Negative for blood in stool, constipation and diarrhea.  Endocrine: Negative for increased urination.  Genitourinary: Negative for painful urination.  Musculoskeletal: Positive for arthralgias, joint pain, joint swelling, muscle weakness and morning stiffness. Negative for myalgias, muscle tenderness and myalgias.  Skin: Negative for color change, pallor, rash, hair loss, nodules/bumps, skin tightness, ulcers and sensitivity to sunlight.  Allergic/Immunologic: Negative for susceptible to infections.  Neurological: Negative for dizziness, numbness, headaches and weakness.  Hematological: Negative for swollen glands.  Psychiatric/Behavioral: Negative for depressed mood and sleep disturbance. The patient is not nervous/anxious.     PMFS History:  Patient Active Problem List   Diagnosis Date Noted  . History of peripheral neuropathy 11/20/2016  . Left wrist tendonitis/ history of surgery for Dequervains  11/20/2016  . Raynaud's disease without gangrene 09/22/2016  . High risk medication  use 09/22/2016  . History of ulcer of lower limb/ Venous stasis ulcer 01/2015  09/22/2016  . Osteopenia of multiple sites 09/22/2016  . Psoriasis 09/03/2016  . Chest wall pain 07/16/2016  . Flank pain, acute 05/15/2016  . Subacromial bursitis of right shoulder joint 04/10/2016  . Low back pain 04/09/2016  .  Pre-ulcerative calluses 02/11/2016  . Varicose veins of right lower extremity with complications 08/12/2015  . Elevated BP 08/09/2015  . Varicose veins of left lower extremity with complications 07/29/2015  . Varicose veins of bilateral lower extremities with other complications 07/09/2015  . Varicose veins of lower extremities with ulcer (HCC) 04/05/2015  . Status post bilateral knee replacements 12/13/2013  . Venous stasis ulcer of ankle (HCC) 06/06/2013  . ADD (attention deficit disorder) 04/04/2012  . INSOMNIA 06/20/2010  . Hypothyroidism 01/17/2010  . Obesity 01/17/2010  . Osteoarthritis 01/17/2010  . ANEMIA 10/02/2009  . SINUSITIS, ACUTE 10/02/2009  . Asthma 10/02/2009  . Venous (peripheral) insufficiency 03/11/2008  . Allergic rhinitis 03/11/2008  . BRONCHITIS, RECURRENT 03/11/2008  . History of migraine 03/11/2008  . Anxiety state 11/24/2007  . Hereditary and idiopathic peripheral neuropathy 11/24/2007  . GERD 11/24/2007  . IRRITABLE BOWEL SYNDROME 11/24/2007  . PSORIATIC ARTHROPATHY 11/24/2007    Past Medical History:  Diagnosis Date  . Allergic rhinitis, cause unspecified   . Allergy   . Anemia, unspecified   . Anxiety state, unspecified   . Arthritis   . Cataract    bilaterally removed 2003,2007  . Esophageal reflux   . Irritable bowel syndrome   . Migraine   . Obesity, unspecified   . Psoriatic arthropathy (HCC)   . Unspecified chronic bronchitis (HCC)   . Unspecified hereditary and idiopathic peripheral neuropathy   . Unspecified hypothyroidism   . Unspecified venous (peripheral) insufficiency     Family History  Problem Relation Age of Onset  . Cirrhosis Father   . COPD Mother   . Heart disease Mother   . Hypertension Mother   . Varicose Veins Mother   . Breast cancer Maternal Grandmother   . Breast cancer Other        1st cousin   . Pancreatic cancer Other        1st cousin   . Thyroid disease Sister   . Colon cancer Neg Hx   . Colon polyps  Neg Hx   . Esophageal cancer Neg Hx   . Rectal cancer Neg Hx   . Stomach cancer Neg Hx    Past Surgical History:  Procedure Laterality Date  . ABDOMINAL HYSTERECTOMY    . ABDOMINAL HYSTERECTOMY    . ABLATION ON ENDOMETRIOSIS     x2  . Bilat TKRs  04/2010   by [name]  . BUNIONECTOMY  2009  . CATARACT EXTRACTION, BILATERAL     [id number]  . COLONOSCOPY  08-26-2004   with gessner tics and hems   . dequervains tensenovitis Left   . JOINT REPLACEMENT Bilateral    knees  . NASAL SINUS SURGERY    . SHOULDER SURGERY  2005  . TOTAL SHOULDER ARTHROPLASTY     Social History   Social History Narrative  . Not on file   Immunization History  Administered Date(s) Administered  . H1N1 05/18/2008  . Influenza Inj Mdck Quad Pf 04/01/2017  . Influenza Inj Mdck Quad With Preservative 04/13/2018  . Influenza Split 03/23/2011, 03/14/2012, 03/23/2013, 03/22/2016  . Influenza Whole 03/08/2008, 03/08/2009, 03/07/2010  . Influenza,inj,Quad PF,6+ Mos 03/21/2014, 03/31/2019  . PFIZER  SARS-COV-2 Vaccination 08/22/2019, 09/12/2019  . Td 01/06/2002  . Tdap 03/14/2012     Objective: Vital Signs: BP 126/83 (BP Location: Left Arm, Patient Position: Sitting, Cuff Size: Normal)   Pulse (!) 101   Resp 17   Ht 5' 7.5" (1.715 m)   Wt 239 lb (108.4 kg)   BMI 36.88 kg/m    Physical Exam Vitals and nursing note reviewed.  Constitutional:      Appearance: She is well-developed.  HENT:     Head: Normocephalic and atraumatic.  Eyes:     Conjunctiva/sclera: Conjunctivae normal.  Pulmonary:     Effort: Pulmonary effort is normal.  Abdominal:     Palpations: Abdomen is soft.  Musculoskeletal:     Cervical back: Normal range of motion.  Skin:    General: Skin is warm and dry.     Capillary Refill: Capillary refill takes less than 2 seconds.  Neurological:     Mental Status: She is alert and oriented to person, place, and time.  Psychiatric:        Behavior: Behavior normal.       Musculoskeletal Exam: C-spine, thoracic spine, lumbar spine have good range of motion.  Thoracic spinal tenderness noted.  No SI joint tenderness noted.  No midline spinal tenderness in the lumbar region.  Shoulder joints, elbow joints, wrist joints, MCPs, PIPs, DIPs have good range of motion with no synovitis.  She is able to make a complete fist bilaterally.  She has PIP and DIP thickening consistent with osteoarthritis of both hands.  She is able to make a complete fist bilaterally.  Hip joints have good range of motion with no discomfort.  Bilateral knee replacements have good range of motion with no discomfort.  She has some tenderness on the anterior aspect of her left knee.  Ankle joints have good range of motion with no tenderness or inflammation.  She has tenderness, mild ecchymosis, and swelling of the left 1st PIP joint.  No tenderness of MTP joints noted.   CDAI Exam: CDAI Score: -- Patient Global: --; Provider Global: -- Swollen: --; Tender: -- Joint Exam 03/11/2020   No joint exam has been documented for this visit   There is currently no information documented on the homunculus. Go to the Rheumatology activity and complete the homunculus joint exam.  Investigation: No additional findings.  Imaging: No results found.  Recent Labs: Lab Results  Component Value Date   WBC 6.0 03/06/2020   HGB 12.8 03/06/2020   PLT 173 03/06/2020   NA 138 03/06/2020   K 4.9 03/06/2020   CL 106 03/06/2020   CO2 24 03/06/2020   GLUCOSE 104 (H) 03/06/2020   BUN 20 03/06/2020   CREATININE 1.13 (H) 03/06/2020   BILITOT 0.3 03/06/2020   ALKPHOS 87 01/25/2017   AST 18 03/06/2020   ALT 20 03/06/2020   PROT 6.6 03/06/2020   ALBUMIN 4.1 01/25/2017   CALCIUM 9.0 03/06/2020   GFRAA 59 (L) 03/06/2020   QFTBGOLDPLUS NEGATIVE 01/29/2020    Speciality Comments: Prior therapy: Methotrexate (elevated creatinine)  Procedures:  No procedures performed Allergies: Nucynta [tapentadol],  Codeine, and Penicillins   Assessment / Plan:     Visit Diagnoses: Psoriatic arthropathy (HCC): She has no synovitis or dactylitis on exam.  She has noticed significant clinical improvement on combination therapy of Arava 10 mg 1 tablet by mouth daily and Enbrel 50 mg subcutaneous injections once weekly.  She was started on Enbrel on 02/08/20.  According to the patient  she started noticed improvement in her joint pain and stiffness after 1 dose.  She has since noticed 100% improvement in her symptoms.  She is not experiencing any Achilles tendinitis or plantar fasciitis at this time.  She has no SI joint tenderness on examination today.  She did fall last night in her garage and is having some discomfort in her left great toe and left knee which is replaced.  X-rays of the left toe were obtained today.  She is not experiencing any other joint pain or inflammation at this time.  She will continue on the current treatment regimen of Arava 10 mg 1 tablet by mouth daily and her Enbrel 50 mg subcu injections once weekly.  She was advised to notify us if she develops signs or symptoms of a flare.  She'll follow-up in the office in 3 months.  Psoriasis: She has no active psoriasis at this time. She is followed closely by her dermatologist.   High risk medication use - Arava 10 mg 1 tablet by mouth daily and enbrel 50 mg subcutaneous injections once weekly. d/c otezla, Cosentyx (started on November 16, 2017-December 2020).  CBC and CMP were drawn on 03/06/2020 and were reviewed with the patient today in the office.  She will be due to update lab work in December and every 3 months to monitor for drug toxicity.  Standing orders for CBC and CMP are in place.  TB gold was negative on 01/29/2020 and will continue to be monitored yearly. She has not had any recent infections.  She was advised to hold Arava and Enbrel if she develops signs or symptoms of an infection and to resume once the infection has completely cleared.  She  has received both COVID-19 vaccinations and plans on receiving the 3rd dose.  She was advised to avoid taking Tylenol and NSAIDs 24 hours prior to the 3rd dose.  She is advised to notify us if she develops a COVID-19 infection in order to receive the antibody infusion.  She voiced understanding.  Sacroiliitis [facility]): She is not experiencing any SI joint pain at this time.  She has no tenderness to palpation on examination today.  She will continue on Enbrel 50 mg subcu injections once weekly.  Status post bilateral knee replacements: She has good range of motion of both knees which are replaced.  Last night she fell in the garage and landed on her left knee.  She has noticed some bruising and tenderness on the anterior aspect of her left knee but no inflammation was noted on examination today.  She continues to use a cane to assist with ambulation.  She was advised to follow-up with Dr. Lequita Halt if her discomfort persists or worsens.  Raynaud's disease without gangrene: Not currently active.  No digital ulcerations or signs of gangrene were noted.  History of ulcer of lower limb/ Venous stasis ulcer 01/2015: Resolved without recurrence.  Osteopenia of multiple sites: DEXA is not in epic.  She is taking vitamin D 1000 units daily and calcium 600 mg daily as recommended.   Left wrist tendonitis/ history of surgery for Dequervains: She has good range of motion of the left wrist with no discomfort at this time.  No tenderness or inflammation was noted.   Pain in thoracic spine: She has been experiencing increased discomfort in her thoracic spine.  She describes the pain as bandlike and an aching sensation.  She is not having any radiating pain at this time.  She states her discomfort  is worse if she stands for prolonged periods of time.  She would like a referral to physical therapy for back strengthening and core strengthening exercises.  Muscular deconditioning: She has noticed generalized muscular  deconditioning over the past several months.  Before her psoriatic arthritis is well controlled she was experiencing increased joint pain and stiffness preventing her from being as active as she would like to be.  Now that her joint pain and inflammation has improved she would like to go to physical therapy to work on muscle strengthening and fall prevention.  A referral to PT will be placed.   Great toe pain, left -She presents today with increased discomfort in her left great toe after falling in her garage last night.  She believes that she hyperextended her 1st PIP joint.  She has tenderness, mild ecchymosis, and swelling of the left 1st PIP joint on examination today.  No tenderness of MTP joints noted.  She has good range of motion of the left ankle joint with no discomfort.  X-rays of the left foot were obtained today to further evaluate.  She was encouraged to ice, elevate, and to wear proper fitting shoes.  She was advised to avoid going barefoot.  Plan: XR Foot Complete Left  Other medical conditions are listed as follows:  Primary insomnia  History of hypertension  History of peripheral neuropathy  History of migraine  History of IBS  History of asthma  History of gastroesophageal reflux (GERD)  History of hypothyroidism    Orders: Orders Placed This Encounter  Procedures  . XR Foot Complete Left   No orders of the defined types were placed in this encounter.   Follow-Up Instructions: Return in about 3 months (around 06/11/2020) for Psoriatic arthritis.   Gearldine Bienenstockaylor M Arran Fessel, PA-C  Note - This record has been created using Dragon software.  Chart creation errors have been sought, but may not always  have been located. Such creation errors do not reflect on  the standard of medical care.

## 2020-03-05 ENCOUNTER — Other Ambulatory Visit: Payer: Self-pay | Admitting: *Deleted

## 2020-03-05 ENCOUNTER — Telehealth: Payer: Self-pay | Admitting: Rheumatology

## 2020-03-05 DIAGNOSIS — Z79899 Other long term (current) drug therapy: Secondary | ICD-10-CM

## 2020-03-05 NOTE — Telephone Encounter (Signed)
Please release lab orders to [facility]. Patient going tomorrow for draw.

## 2020-03-05 NOTE — Telephone Encounter (Signed)
Lab Orders released.  

## 2020-03-06 DIAGNOSIS — Z79899 Other long term (current) drug therapy: Secondary | ICD-10-CM | POA: Diagnosis not present

## 2020-03-07 LAB — CBC WITH DIFFERENTIAL/PLATELET
Absolute Monocytes: 672 cells/uL (ref 200–950)
Basophils Absolute: 42 cells/uL (ref 0–200)
Basophils Relative: 0.7 %
Eosinophils Absolute: 180 cells/uL (ref 15–500)
Eosinophils Relative: 3 %
HCT: 39 % (ref 35.0–45.0)
Hemoglobin: 12.8 g/dL (ref 11.7–15.5)
Lymphs Abs: 2388 cells/uL (ref 850–3900)
MCH: 26.4 pg — ABNORMAL LOW (ref 27.0–33.0)
MCHC: 32.8 g/dL (ref 32.0–36.0)
MCV: 80.4 fL (ref 80.0–100.0)
MPV: 10.4 fL (ref 7.5–12.5)
Monocytes Relative: 11.2 %
Neutro Abs: 2718 cells/uL (ref 1500–7800)
Neutrophils Relative %: 45.3 %
Platelets: 173 10*3/uL (ref 140–400)
RBC: 4.85 10*6/uL (ref 3.80–5.10)
RDW: 14.9 % (ref 11.0–15.0)
Total Lymphocyte: 39.8 %
WBC: 6 10*3/uL (ref 3.8–10.8)

## 2020-03-07 LAB — COMPLETE METABOLIC PANEL WITH GFR
AG Ratio: 1.6 (calc) (ref 1.0–2.5)
ALT: 20 U/L (ref 6–29)
AST: 18 U/L (ref 10–35)
Albumin: 4.1 g/dL (ref 3.6–5.1)
Alkaline phosphatase (APISO): 102 U/L (ref 37–153)
BUN/Creatinine Ratio: 18 (calc) (ref 6–22)
BUN: 20 mg/dL (ref 7–25)
CO2: 24 mmol/L (ref 20–32)
Calcium: 9 mg/dL (ref 8.6–10.4)
Chloride: 106 mmol/L (ref 98–110)
Creat: 1.13 mg/dL — ABNORMAL HIGH (ref 0.50–0.99)
GFR, Est African American: 59 mL/min/{1.73_m2} — ABNORMAL LOW (ref >=60)
GFR, Est Non African American: 51 mL/min/{1.73_m2} — ABNORMAL LOW (ref >=60)
Globulin: 2.5 g/dL (calc) (ref 1.9–3.7)
Glucose, Bld: 104 mg/dL — ABNORMAL HIGH (ref 65–99)
Potassium: 4.9 mmol/L (ref 3.5–5.3)
Sodium: 138 mmol/L (ref 135–146)
Total Bilirubin: 0.3 mg/dL (ref 0.2–1.2)
Total Protein: 6.6 g/dL (ref 6.1–8.1)

## 2020-03-07 NOTE — Progress Notes (Signed)
GFR is low but is stable.  CBC stable.  Please forward labs to her PCP.

## 2020-03-11 ENCOUNTER — Ambulatory Visit (INDEPENDENT_AMBULATORY_CARE_PROVIDER_SITE_OTHER): Payer: BC Managed Care – PPO | Admitting: Physician Assistant

## 2020-03-11 ENCOUNTER — Other Ambulatory Visit: Payer: Self-pay

## 2020-03-11 ENCOUNTER — Ambulatory Visit: Payer: Self-pay

## 2020-03-11 ENCOUNTER — Encounter: Payer: Self-pay | Admitting: Physician Assistant

## 2020-03-11 VITALS — BP 126/83 | HR 101 | Resp 17 | Ht 67.5 in | Wt 239.0 lb

## 2020-03-11 DIAGNOSIS — L409 Psoriasis, unspecified: Secondary | ICD-10-CM

## 2020-03-11 DIAGNOSIS — M8589 Other specified disorders of bone density and structure, multiple sites: Secondary | ICD-10-CM

## 2020-03-11 DIAGNOSIS — M79675 Pain in left toe(s): Secondary | ICD-10-CM

## 2020-03-11 DIAGNOSIS — M778 Other enthesopathies, not elsewhere classified: Secondary | ICD-10-CM

## 2020-03-11 DIAGNOSIS — Z872 Personal history of diseases of the skin and subcutaneous tissue: Secondary | ICD-10-CM

## 2020-03-11 DIAGNOSIS — I7300 Raynaud's syndrome without gangrene: Secondary | ICD-10-CM

## 2020-03-11 DIAGNOSIS — Z79899 Other long term (current) drug therapy: Secondary | ICD-10-CM

## 2020-03-11 DIAGNOSIS — Z96653 Presence of artificial knee joint, bilateral: Secondary | ICD-10-CM

## 2020-03-11 DIAGNOSIS — M461 Sacroiliitis, not elsewhere classified: Secondary | ICD-10-CM | POA: Diagnosis not present

## 2020-03-11 DIAGNOSIS — Z8639 Personal history of other endocrine, nutritional and metabolic disease: Secondary | ICD-10-CM

## 2020-03-11 DIAGNOSIS — L4050 Arthropathic psoriasis, unspecified: Secondary | ICD-10-CM | POA: Diagnosis not present

## 2020-03-11 DIAGNOSIS — F5101 Primary insomnia: Secondary | ICD-10-CM

## 2020-03-11 DIAGNOSIS — Z8679 Personal history of other diseases of the circulatory system: Secondary | ICD-10-CM

## 2020-03-11 DIAGNOSIS — R29898 Other symptoms and signs involving the musculoskeletal system: Secondary | ICD-10-CM

## 2020-03-11 DIAGNOSIS — Z8719 Personal history of other diseases of the digestive system: Secondary | ICD-10-CM

## 2020-03-11 DIAGNOSIS — M546 Pain in thoracic spine: Secondary | ICD-10-CM

## 2020-03-11 DIAGNOSIS — Z8669 Personal history of other diseases of the nervous system and sense organs: Secondary | ICD-10-CM

## 2020-03-11 DIAGNOSIS — Z8709 Personal history of other diseases of the respiratory system: Secondary | ICD-10-CM

## 2020-03-11 NOTE — Patient Instructions (Addendum)
COVID-19 vaccine recommendations:  ° °COVID-19 vaccine is recommended for everyone (unless you are allergic to a vaccine component), even if you are on a medication that suppresses your immune system.  ° °If you are on Methotrexate, Cellcept (mycophenolate), Rinvoq, Xeljanz, and Olumiant- hold the medication for 1 week after each vaccine. Hold Methotrexate for 2 weeks after the single dose COVID-19 vaccine.  ° °If you are on Orencia subcutaneous injection - hold medication one week prior to and one week after the first COVID-19 vaccine dose (only).  ° °If you are on Orencia IV infusions- time vaccination administration so that the first COVID-19 vaccination will occur four weeks after the infusion and postpone the subsequent infusion by one week.  ° °If you are on Cyclophosphamide or Rituxan infusions please contact your doctor prior to receiving the COVID-19 vaccine.  ° °Do not take Tylenol or any anti-inflammatory medications (NSAIDs) 24 hours prior to the COVID-19 vaccination.  ° °There is no direct evidence about the efficacy of the COVID-19 vaccine in individuals who are on medications that suppress the immune system.  ° °Even if you are fully vaccinated, and you are on any medications that suppress your immune system, please continue to wear a mask, maintain at least six feet social distance and practice hand hygiene.  ° °If you develop a COVID-19 infection, please contact your PCP or our office to determine if you need antibody infusion. ° °The booster vaccine is now available for immunocompromised patients. It is advised that if you had Pfizer vaccine you should get Pfizer booster.  If you had a Moderna vaccine then you should get a Moderna booster. Johnson and Johnson does not have a booster vaccine at this time. ° °Please see the following web sites for updated information.   ° °https://[email or web address].org/Portals/0/Files/COVID-19-Vaccination-Patient-Resources.pdf ° °https://[email or web address].org/About-Us/Newsroom/Press-Releases/ID/1159 ° °Standing Labs °We placed an order today for your standing lab work.  ° °Please have your standing labs drawn in December and every 3 months  ° °If possible, please have your labs drawn 2 weeks prior to your appointment so that the provider can discuss your results at your appointment. ° °We have open lab daily °Monday through Thursday from 8:30-12:30 PM and 1:30-4:30 PM and Friday from 8:30-12:30 PM and 1:30-4:00 PM °at the office of Dr. Shaili Deveshwar, [facility].   °Please be advised, patients with office appointments requiring lab work will take precedents over walk-in lab work.  °If possible, please come for your lab work on Monday and Friday afternoons, as you may experience shorter wait times. °The office is located at [address], [address], [place], [place] [address] °No appointment is necessary.   °Labs are drawn by [facility]. Please bring your co-pay at the time of your lab draw.  You may receive a bill from [facility] for your lab work. ° °If you wish to have your labs drawn at another location, please call the office 24 hours in advance to send orders. ° °If you have any questions regarding directions or hours of operation,  °please call [phone].   °As a reminder, please drink plenty of water prior to coming for your lab work. Thanks! ° ° °

## 2020-03-12 ENCOUNTER — Telehealth: Payer: Self-pay

## 2020-03-12 ENCOUNTER — Other Ambulatory Visit: Payer: Self-pay

## 2020-03-12 DIAGNOSIS — M546 Pain in thoracic spine: Secondary | ICD-10-CM

## 2020-03-12 DIAGNOSIS — R29898 Other symptoms and signs involving the musculoskeletal system: Secondary | ICD-10-CM

## 2020-03-12 NOTE — Telephone Encounter (Signed)
Called patient and advised her of x-ray results from 03/11/2020 "These findings are consistent with osteoarthritis and  postsurgical changes. No fracture was noted."   Also advised patient that Dr. Corliss Skains does recommend a repeat x-ray in 10 days if symptoms persist. Patient verbalized understanding.

## 2020-03-18 MED FILL — ENBREL MINI 50 MG/ML SOCT: 50 | 28 days supply | Qty: 4 | Fill #1

## 2020-03-19 ENCOUNTER — Other Ambulatory Visit: Payer: Self-pay | Admitting: Physician Assistant

## 2020-03-19 NOTE — Telephone Encounter (Signed)
Last Visit: 03/11/2020 Next Visit: 06/18/2020   Last fill: 01/29/2020   Okay to refill methocarbamol?

## 2020-03-25 ENCOUNTER — Other Ambulatory Visit: Payer: Self-pay

## 2020-03-25 ENCOUNTER — Ambulatory Visit: Payer: BC Managed Care – PPO | Attending: Physician Assistant | Admitting: Physical Therapy

## 2020-03-25 DIAGNOSIS — M546 Pain in thoracic spine: Secondary | ICD-10-CM | POA: Diagnosis not present

## 2020-03-25 DIAGNOSIS — M6281 Muscle weakness (generalized): Secondary | ICD-10-CM | POA: Diagnosis not present

## 2020-03-25 NOTE — Therapy (Addendum)
[facility] [address]  [address] [place], [place], [address] Phone: [phone]   Fax:  [phone]  Physical Therapy Evaluation  Patient Details  Name: Sarah Horton MRN: [id number] Date of Birth: Oct 24, 1950 Referring Provider (Sarah Horton): Sherron Ales, PA-C   Encounter Date: 03/25/2020   Sarah Horton End of Session - 03/25/20 1620    Visit Number 1    Number of Visits 12    Date for Sarah Horton Re-Evaluation 05/06/20    Authorization Type BCBS    Sarah Horton Start Time 1620    Sarah Horton Stop Time 1720    Sarah Horton Time Calculation (min) 60 min    Activity Tolerance Patient tolerated treatment well    Behavior During Therapy [facility] for tasks assessed/performed           Past Medical History:  Diagnosis Date  . Allergic rhinitis, cause unspecified   . Allergy   . Anemia, unspecified   . Anxiety state, unspecified   . Arthritis   . Cataract    bilaterally removed 2003,2007  . Esophageal reflux   . Irritable bowel syndrome   . Migraine   . Obesity, unspecified   . Psoriatic arthropathy (HCC)   . Unspecified chronic bronchitis (HCC)   . Unspecified hereditary and idiopathic peripheral neuropathy   . Unspecified hypothyroidism   . Unspecified venous (peripheral) insufficiency     Past Surgical History:  Procedure Laterality Date  . ABDOMINAL HYSTERECTOMY    . ABDOMINAL HYSTERECTOMY    . ABLATION ON ENDOMETRIOSIS     x2  . Bilat TKRs  04/2010   by [name]  . BUNIONECTOMY  2009  . CATARACT EXTRACTION, BILATERAL     [id number]  . COLONOSCOPY  08-26-2004   with gessner tics and hems   . dequervains tensenovitis Left   . JOINT REPLACEMENT Bilateral    knees  . NASAL SINUS SURGERY    . SHOULDER SURGERY  2005  . TOTAL SHOULDER ARTHROPLASTY      There were no vitals filed for this visit.    Subjective Assessment - 03/25/20 1623    Subjective Sarah Horton with longstanding h/o psoriatic arthritis - had to stop taking Cosentyx due to lack of insurance  coverage with plans to switch to Enbrel as of the new year but before she could switch, she started having vascular issues in her LEs. Rheumatologist wanted her to f/u with vascular MD before starting her back on Enbrel which took several months. During that time her pain worsened in all her joints which lead to deconditioning, weakness and fatigue. Core and postural muscle weakness has contributed to upper back/thoracic pain.    Pertinent History Psoriatic arthritis, OA, B TKA 2011, venous insufficiency, h/o venous statis ulcers, Raynaud's disease, peripheral neuropathy, osteopenia, R shoulder surgery 2005, GERD, IBS, L wrist tendonitis (h/o surgery for Dequervains), obesity    Limitations Standing;House hold activities    How long can you stand comfortably? 10 minutes    Patient Stated Goals "to be able to stand long enough (up to 30 min) to cook and take care of things around the house & get back to using exercise equipment or walking for exercise"    Currently in Pain? Yes    Pain Score 4     Pain Location Thoracic    Pain Orientation Left;Right;Posterior;Mid   L>R   Pain Descriptors / Indicators Dull   starts off dull and becomes "piercing"   Pain Type Chronic pain    Pain  Radiating Towards deep into chest    Pain Onset Other (comment)   April/May 2021   Pain Frequency Intermittent    Aggravating Factors  prolonged standing, reading a book while sitting (book in lap)    Pain Relieving Factors sitting down & relaxing    Effect of Pain on Daily Activities multiple episodes where it was difficult to get a deep breath but none since July; limited standing tolerance              [facility] Sarah Horton Assessment - 03/25/20 1620      Assessment   Medical Diagnosis Thoracic back pain & muscular deconditioning    Referring Provider (Sarah Horton) Sherron Ales, PA-C    Onset Date/Surgical Date --   April/May 2021   Hand Dominance Left    Next MD Visit 06/18/20 - Dr. Corliss Skains    Prior Therapy 2017 for LBP & R  shoulder pain      Precautions   Precautions None      Balance Screen   Has the patient fallen in the past 6 months Yes    How many times? 1    Has the patient had a decrease in activity level because of a fear of falling?  No    Is the patient reluctant to leave their home because of a fear of falling?  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 - plans to retire at the end of the yesr    Leisure traveling, reading, used to use Land or exercise bike ~3-5x/wk      Cognition   Overall Cognitive Status Within Functional Limits for tasks assessed      Observation/Other Assessments   Focus on Therapeutic Outcomes (FOTO)  Thoracic - 41% (59% limitation); Predicted 53% (47% limitation)      Posture/Postural Control   Posture/Postural Control Postural limitations    Postural Limitations Forward head;Rounded Shoulders;Increased thoracic kyphosis   mild     ROM / Strength   AROM / PROM / Strength AROM;Strength      AROM   AROM Assessment Site Lumbar;Shoulder    Right/Left Shoulder --   Bilateral WFL   Lumbar Flexion fingertips to toes    Lumbar Extension 25% limited    Lumbar - Right Side Bend hand to fibular head    Lumbar - Left Side Bend hand to fibular head    Lumbar - Right Rotation 25% limited    Lumbar - Left Rotation 25% limited      Strength   Strength Assessment Site Shoulder;Hip;Knee;Ankle    Right/Left Shoulder Right;Left    Right Shoulder Flexion 4-/5    Right Shoulder ABduction 4-/5    Right Shoulder Internal Rotation 4/5    Right Shoulder External Rotation 4/5    Left Shoulder Flexion 4-/5    Left  Shoulder ABduction 4-/5    Left Shoulder Internal Rotation 4/5    Left Shoulder External Rotation 4/5    Right/Left Hip Right;Left    Right Hip Flexion 4-/5    Right Hip Extension 3+/5  Right Hip External Rotation  4-/5    Right Hip Internal Rotation 4/5    Right Hip ABduction 3+/5    Right Hip ADduction 4-/5    Left Hip Flexion 4-/5    Left Hip Extension 3+/5    Left Hip External Rotation 4-/5    Left Hip Internal Rotation 4/5    Left Hip ABduction 3+/5    Left Hip ADduction 4-/5    Right/Left Knee Right;Left    Right Knee Flexion 4+/5    Right Knee Extension 4+/5    Left Knee Flexion 4+/5    Left Knee Extension 4+/5    Right/Left Ankle Right;Left    Right Ankle Dorsiflexion 4/5    Right Ankle Plantar Flexion 3/5    Left Ankle Dorsiflexion 4/5    Left Ankle Plantar Flexion 3/5      Flexibility   Soft Tissue Assessment /Muscle Length yes    Hamstrings WNL    Quadriceps B mod to significant quad & hip flexor tightness    ITB mild tight R>L    Piriformis B very mild tight                      Objective measurements completed on examination: See above findings.       [facility] Adult Sarah Horton Treatment/Exercise - 03/25/20 1620      Lumbar Exercises: Stretches   Hip Flexor Stretch Right;Left;30 seconds;1 rep    Hip Flexor Stretch Limitations seated lunge position over edge of chair      Lumbar Exercises: Seated   Other Seated Lumbar Exercises Thoracic extension mobs over back of chair with [facility]                   Sarah Horton Education - 03/25/20 1720    Education Details Sarah Horton eval findings, anticipated POC and initial HEP    Person(s) Educated Patient    Methods Explanation;Demonstration;Verbal cues;Handout    Comprehension Verbalized understanding;Verbal cues required;Returned demonstration;Need further instruction            Sarah Horton Short Term Goals - 03/25/20 1813      Sarah Horton SHORT TERM GOAL #1   Title Patient will be independent with initial HEP    Status New     Target Date 04/08/20      Sarah Horton SHORT TERM GOAL #2   Title Patient will verbalize/demonstrate understanding of neutral spine posture and proper body mechanics to reduce strain on thoracic spine    Status New    Target Date 04/15/20             Sarah Horton Long Term Goals - 03/25/20 1815      Sarah Horton LONG TERM GOAL #1   Title Patient will be independent with ongoing/advanced HEP    Status New    Target Date 05/06/20      Sarah Horton LONG TERM GOAL #2   Title Patient to demonstrate ability to achieve and maintain good spinal alignment/posturing    Status New    Target Date 05/06/20      Sarah Horton LONG TERM GOAL #3   Title Patient to report pain reduction in frequency and intensity by >/= 50%    Status New    Target Date 05/06/20      Sarah Horton LONG TERM GOAL #4   Title Patient will demonstrate improved B shoulder and LE strength to >/= 4+/5 for improved stability and ease of mobility    Status New    Target Date 05/06/20  Sarah Horton LONG TERM GOAL #5   Title Patient will improve standing tolerance to >/= 30 minutes w/o pain interference to allow resumption of normal daily activities    Status New    Target Date 05/06/20                  Plan - 03/25/20 1420    Clinical Impression Statement Sarah Horton is a 70 y/o female who presents to OP Sarah Horton with thoracic back pain and muscular deconditioning resulting from a flare of psoriatic arthritis pain early this year while Sarah Horton was off biologic meds during transition from Cosentyx to Enbrel. Sarah Horton feels that her core weakness from the deconditioning is contributing to her thoracic back pain with pain typically triggered by prolonged standing. Deficits include mildly forward head and rounded shoulder posture with increased kyphosis, mildly limited thoracolumbar AROM, proximal UE and LE weakness along with decreased strength in core and postural muscles. Sarah Horton will benefit from skilled Sarah Horton to address above deficits and restore functional ROM and strength with decreased pain  to increase tolerance for ADLs and daily activities.    Personal Factors and Comorbidities Comorbidity 3+;Time since onset of injury/illness/exacerbation;Past/Current Experience;Fitness;Age    Comorbidities Psoriatic arthritis, OA, B TKA 2011, venous insufficiency, h/o venous statis ulcers, Raynaud's disease, peripheral neuropathy, osteopenia, R shoulder surgery 2005, GERD, IBS, L wrist tendonitis (h/o surgery for Dequervains), obesity    Examination-Activity Limitations Stand;Locomotion Level;Sit    Examination-Participation Restrictions Meal Prep;Laundry;Shop;Community Activity;Cleaning    Stability/Clinical Decision Making Evolving/Moderate complexity    Clinical Decision Making Moderate    Rehab Potential Good    Sarah Horton Frequency 2x / week    Sarah Horton Duration 6 weeks    Sarah Horton Treatment/Interventions ADLs/Self Care Home Management;Electrical Stimulation;Iontophoresis 4mg /ml Dexamethasone;Moist Heat;Ultrasound;Gait training;Functional mobility training;Therapeutic activities;Therapeutic exercise;Balance training;Neuromuscular re-education;Patient/family education;Manual techniques;Passive range of motion;Dry needling;Taping;Spinal Manipulations;Joint Manipulations    Sarah Horton Next Visit Plan review initial HEP; posture & body mechaincs education; thoracolumbar mobility/flexibility; core/proximal UE/LE strengthening; manual therapy and modalities PRN for pain    Sarah Horton Home Exercise Plan 10/18 - thoracic extension mobs, hip flexor stretch    Consulted and Agree with Plan of Care Patient           Patient will benefit from skilled therapeutic intervention in order to improve the following deficits and impairments:  Decreased activity tolerance, Decreased endurance, Decreased range of motion, Decreased strength, Difficulty walking, Impaired perceived functional ability, Impaired flexibility, Improper body mechanics, Postural dysfunction, Pain  Visit Diagnosis: Pain in thoracic spine - Plan: Sarah Horton plan of care  cert/re-cert  Muscle weakness (generalized) - Plan: Sarah Horton plan of care cert/re-cert     Problem List Patient Active Problem List   Diagnosis Date Noted  . History of peripheral neuropathy 11/20/2016  . Left wrist tendonitis/ history of surgery for Dequervains  11/20/2016  . Raynaud's disease without gangrene 09/22/2016  . High risk medication use 09/22/2016  . History of ulcer of lower limb/ Venous stasis ulcer 01/2015  09/22/2016  . Osteopenia of multiple sites 09/22/2016  . Psoriasis 09/03/2016  . Chest wall pain 07/16/2016  . Flank pain, acute 05/15/2016  . Subacromial bursitis of right shoulder joint 04/10/2016  . Low back pain 04/09/2016  . Pre-ulcerative calluses 02/11/2016  . Varicose veins of right lower extremity with complications 08/12/2015  . Elevated BP 08/09/2015  . Varicose veins of left lower extremity with complications 07/29/2015  . Varicose veins of bilateral lower extremities with other complications 07/09/2015  . Varicose veins of lower extremities with ulcer (HCC)  04/05/2015  . Status post bilateral knee replacements 12/13/2013  . Venous stasis ulcer of ankle (HCC) 06/06/2013  . ADD (attention deficit disorder) 04/04/2012  . INSOMNIA 06/20/2010  . Hypothyroidism 01/17/2010  . Obesity 01/17/2010  . Osteoarthritis 01/17/2010  . ANEMIA 10/02/2009  . SINUSITIS, ACUTE 10/02/2009  . Asthma 10/02/2009  . Venous (peripheral) insufficiency 03/11/2008  . Allergic rhinitis 03/11/2008  . BRONCHITIS, RECURRENT 03/11/2008  . History of migraine 03/11/2008  . Anxiety state 11/24/2007  . Hereditary and idiopathic peripheral neuropathy 11/24/2007  . GERD 11/24/2007  . IRRITABLE BOWEL SYNDROME 11/24/2007  . PSORIATIC ARTHROPATHY 11/24/2007    Marry GuanJoAnne M Cashmere Horton, Sarah Horton, Sarah Horton 03/25/2020, 6:58 PM  [facility] [place] [address]  [address] [place], [place], [address] Phone: [phone]   Fax:  [phone]  Name: Sarah BachCatherine M  Horton MRN: [id number] Date of Birth: November 23, 1950

## 2020-03-25 NOTE — Patient Instructions (Signed)
    Home exercise program created by Verdelle Valtierra, PT.  For questions, please contact Alaria Oconnor via phone at [phone] or email at [email or web address]  [facility] [place] [address]  [address] [place], [place], [address] Phone: [phone]   Fax:  [phone]    

## 2020-04-02 ENCOUNTER — Other Ambulatory Visit: Payer: Self-pay

## 2020-04-02 ENCOUNTER — Ambulatory Visit: Payer: BC Managed Care – PPO

## 2020-04-02 DIAGNOSIS — M546 Pain in thoracic spine: Secondary | ICD-10-CM

## 2020-04-02 DIAGNOSIS — M6281 Muscle weakness (generalized): Secondary | ICD-10-CM | POA: Diagnosis not present

## 2020-04-02 NOTE — Therapy (Signed)
[facility] [place] [address]  [place], [place], [address] Phone: [phone]   Fax:  [phone]  Physical Therapy Treatment  Patient Details  Name: Sarah Horton MRN: [id number] Date of Birth: [birth year] Referring Provider (PT): Hazel Sams, PA-C   Encounter Date: 04/02/2020   PT End of Session - 04/02/20 1711    Visit Number 2    Number of Visits 12    Date for PT Re-Evaluation 05/06/20    Authorization Type BCBS    PT Start Time 1704    PT Stop Time 1750    PT Time Calculation (min) 46 min    Activity Tolerance Patient tolerated treatment well    Behavior During Therapy [facility] for tasks assessed/performed           Past Medical History:  Diagnosis Date  . Allergic rhinitis, cause unspecified   . Allergy   . Anemia, unspecified   . Anxiety state, unspecified   . Arthritis   . Cataract    bilaterally removed 2003,2007  . Esophageal reflux   . Irritable bowel syndrome   . Migraine   . Obesity, unspecified   . Psoriatic arthropathy ([place])   . Unspecified chronic bronchitis ([place])   . Unspecified hereditary and idiopathic peripheral neuropathy   . Unspecified hypothyroidism   . Unspecified venous (peripheral) insufficiency     Past Surgical History:  Procedure Laterality Date  . ABDOMINAL HYSTERECTOMY    . ABDOMINAL HYSTERECTOMY    . ABLATION ON ENDOMETRIOSIS     x2  . Bilat TKRs  04/2010   by [name]  . BUNIONECTOMY  2009  . CATARACT EXTRACTION, BILATERAL     [id number]  . COLONOSCOPY  08-26-2004   with gessner tics and hems   . dequervains tensenovitis Left   . JOINT REPLACEMENT Bilateral    knees  . NASAL SINUS SURGERY    . SHOULDER SURGERY  2005  . TOTAL SHOULDER ARTHROPLASTY      There were no vitals filed for this visit.   Subjective Assessment - 04/02/20 1709    Subjective Pt. noting improved pain levels today.  Had some issues with HEP she wishes to review.    Pertinent  History Psoriatic arthritis, OA, B TKA 2011, venous insufficiency, h/o venous statis ulcers, Raynaud's disease, peripheral neuropathy, osteopenia, R shoulder surgery 2005, GERD, IBS, L wrist tendonitis (h/o surgery for Dequervains), obesity    Patient Stated Goals "to be able to stand long enough (up to 30 min) to cook and take care of things around the house & get back to using exercise equipment or walking for exercise"    Currently in Pain? No/denies    Pain Score 0-No pain   mid L sided back pain up to 7/10 briefly while cleaning out closets   Pain Location Thoracic    Pain Orientation Left    Pain Descriptors / Indicators Sharp    Pain Type Chronic pain    Pain Frequency Intermittent    Aggravating Factors  cleaning out closet    Multiple Pain Sites No                             [facility] Adult PT Treatment/Exercise - 04/02/20 0001      Self-Care   Self-Care Other Self-Care Comments    Other Self-Care Comments  Instruction in proper posture and body mechanics with daily activities  Lumbar Exercises: Stretches   Lower Trunk Rotation Limitations 5" x 10    Hip Flexor Stretch Right;Left;30 seconds;1 rep    Hip Flexor Stretch Limitations seated lunge position over edge of chair      Lumbar Exercises: Aerobic   Nustep lvl 3, 6 min (LE/UE)      Lumbar Exercises: Machines for Strengthening   Other Lumbar Machine Exercise B low row 10# x 15 reps       Lumbar Exercises: Seated   Other Seated Lumbar Exercises thoracic spine sitting in chair 5" x 10 reps       Lumbar Exercises: Supine   Bridge 10 reps;3 seconds    Bridge Limitations Hooklying            Therex:  Provided alternate version of hip flexor stretch: mod thomas B hip flexor stretch x 30 sec with strap - well tolerated           PT Short Term Goals - 04/02/20 1711      PT SHORT TERM GOAL #1   Title Patient will be independent with initial HEP    Status Achieved    Target Date 04/08/20       PT SHORT TERM GOAL #2   Title Patient will verbalize/demonstrate understanding of neutral spine posture and proper body mechanics to reduce strain on thoracic spine    Status Achieved    Target Date 04/15/20             PT Long Term Goals - 04/02/20 1739      PT LONG TERM GOAL #1   Title Patient will be independent with ongoing/advanced HEP    Status On-going      PT LONG TERM GOAL #2   Title Patient to demonstrate ability to achieve and maintain good spinal alignment/posturing    Status On-going      PT LONG TERM GOAL #3   Title Patient to report pain reduction in frequency and intensity by >/= 50%    Status On-going      PT LONG TERM GOAL #4   Title Patient will demonstrate improved B shoulder and LE strength to >/= 4+/5 for improved stability and ease of mobility    Status On-going      PT LONG TERM GOAL #5   Title Patient will improve standing tolerance to >/= 30 minutes w/o pain interference to allow resumption of normal daily activities    Status On-going                 Plan - 04/02/20 1740    Clinical Impression Statement Pt. reporting that she had LE cramping with seated hip flexor stretch.   Start out session reviewing HEP without pt. able to perform both activities included in HEP without LE cramping.  Pt. still requesting alternate version of hip flexor stretch thus provided her HEP handout of alternate including UE red TB resisted row and bridge which were tolerated well in session without pain.  Focused session on instruction in proper posture and body mechanics with daily activities to reduce lumbar/thoracic strain.  Pt. verbalizing good understanding of body mechanics education and with plans to incorporate these principles into her daily routine.  STG #1, #2 met.     Comorbidities Psoriatic arthritis, OA, B TKA 2011, venous insufficiency, h/o venous statis ulcers, Raynaud's disease, peripheral neuropathy, osteopenia, R shoulder surgery 2005, GERD,  IBS, L wrist tendonitis (h/o surgery for Dequervains), obesity    Rehab Potential Good  PT Frequency 2x / week    PT Duration 6 weeks    PT Treatment/Interventions ADLs/Self Care Home Management;Electrical Stimulation;Iontophoresis 70m/ml Dexamethasone;Moist Heat;Ultrasound;Gait training;Functional mobility training;Therapeutic activities;Therapeutic exercise;Balance training;Neuromuscular re-education;Patient/family education;Manual techniques;Passive range of motion;Dry needling;Taping;Spinal Manipulations;Joint Manipulations    PT Next Visit Plan Thoracolumbar mobility/flexibility; core/proximal UE/LE strengthening; manual therapy and modalities PRN for pain    PT Home Exercise Plan 10/18 - thoracic extension mobs, hip flexor stretch    Consulted and Agree with Plan of Care Patient           Patient will benefit from skilled therapeutic intervention in order to improve the following deficits and impairments:  Decreased activity tolerance, Decreased endurance, Decreased range of motion, Decreased strength, Difficulty walking, Impaired perceived functional ability, Impaired flexibility, Improper body mechanics, Postural dysfunction, Pain  Visit Diagnosis: Pain in thoracic spine  Muscle weakness (generalized)     Problem List Patient Active Problem List   Diagnosis Date Noted  . History of peripheral neuropathy 11/20/2016  . Left wrist tendonitis/ history of surgery for Dequervains  11/20/2016  . Raynaud's disease without gangrene 09/22/2016  . High risk medication use 09/22/2016  . History of ulcer of lower limb/ Venous stasis ulcer 01/2015  09/22/2016  . Osteopenia of multiple sites 09/22/2016  . Psoriasis 09/03/2016  . Chest wall pain 07/16/2016  . Flank pain, acute 05/15/2016  . Subacromial bursitis of right shoulder joint 04/10/2016  . Low back pain 04/09/2016  . Pre-ulcerative calluses 02/11/2016  . Varicose veins of right lower extremity with complications 084/78/4128 .  Elevated BP 08/09/2015  . Varicose veins of left lower extremity with complications 020/81/3887 . Varicose veins of bilateral lower extremities with other complications 019/59/7471 . Varicose veins of lower extremities with ulcer ([place] 04/05/2015  . Status post bilateral knee replacements 12/13/2013  . Venous stasis ulcer of ankle ([place] 06/06/2013  . ADD (attention deficit disorder) 04/04/2012  . INSOMNIA 06/20/2010  . Hypothyroidism 01/17/2010  . Obesity 01/17/2010  . Osteoarthritis 01/17/2010  . ANEMIA 10/02/2009  . SINUSITIS, ACUTE 10/02/2009  . Asthma 10/02/2009  . Venous (peripheral) insufficiency 03/11/2008  . Allergic rhinitis 03/11/2008  . BRONCHITIS, RECURRENT 03/11/2008  . History of migraine 03/11/2008  . Anxiety state 11/24/2007  . Hereditary and idiopathic peripheral neuropathy 11/24/2007  . GERD 11/24/2007  . IRRITABLE BOWEL SYNDROME 11/24/2007  . PSORIATIC ARTHROPATHY 11/24/2007   MBess Harvest PTA 04/02/20 6:15 PM   [place] [address] [place] [address]: [phone]  Fax:  [phone] Name: Sarah HEYWARDMRN: [id number] of Birth: 108-22-1952

## 2020-04-02 NOTE — Patient Instructions (Addendum)

## 2020-04-09 ENCOUNTER — Ambulatory Visit: Payer: BC Managed Care – PPO | Attending: Physician Assistant | Admitting: Physical Therapy

## 2020-04-09 ENCOUNTER — Other Ambulatory Visit: Payer: Self-pay

## 2020-04-09 DIAGNOSIS — M546 Pain in thoracic spine: Secondary | ICD-10-CM

## 2020-04-09 DIAGNOSIS — M6281 Muscle weakness (generalized): Secondary | ICD-10-CM | POA: Insufficient documentation

## 2020-04-09 NOTE — Therapy (Signed)
[facility] [place] [address]  [place], [place], [address] Phone: [phone]   Fax:  [phone]  Physical Therapy Treatment  Patient Details  Name: Sarah Horton MRN: [id number] Date of Birth: [birth year] Referring Provider (PT): Hazel Sams, PA-C   Encounter Date: 04/09/2020   PT End of Session - 04/09/20 1621    Visit Number 3    Number of Visits 12    Date for PT Re-Evaluation 05/06/20    Authorization Type BCBS    PT Start Time 8850   Pt arrived late   PT Stop Time 1659    PT Time Calculation (min) 38 min    Activity Tolerance Patient tolerated treatment well    Behavior During Therapy [facility] for tasks assessed/performed           Past Medical History:  Diagnosis Date  . Allergic rhinitis, cause unspecified   . Allergy   . Anemia, unspecified   . Anxiety state, unspecified   . Arthritis   . Cataract    bilaterally removed 2003,2007  . Esophageal reflux   . Irritable bowel syndrome   . Migraine   . Obesity, unspecified   . Psoriatic arthropathy ([place])   . Unspecified chronic bronchitis ([place])   . Unspecified hereditary and idiopathic peripheral neuropathy   . Unspecified hypothyroidism   . Unspecified venous (peripheral) insufficiency     Past Surgical History:  Procedure Laterality Date  . ABDOMINAL HYSTERECTOMY    . ABDOMINAL HYSTERECTOMY    . ABLATION ON ENDOMETRIOSIS     x2  . Bilat TKRs  04/2010   by [name]  . BUNIONECTOMY  2009  . CATARACT EXTRACTION, BILATERAL     [id number]  . COLONOSCOPY  08-26-2004   with gessner tics and hems   . dequervains tensenovitis Left   . JOINT REPLACEMENT Bilateral    knees  . NASAL SINUS SURGERY    . SHOULDER SURGERY  2005  . TOTAL SHOULDER ARTHROPLASTY      There were no vitals filed for this visit.   Subjective Assessment - 04/09/20 1624    Subjective Pt feeling more comfortable with initial HEP after review last session and no concerns  with latest update. Notes good familiarity with posture and body mechanics from prior PT episodes with a few new ideas also seeming helpful.    Pertinent History Psoriatic arthritis, OA, B TKA 2011, venous insufficiency, h/o venous statis ulcers, Raynaud's disease, peripheral neuropathy, osteopenia, R shoulder surgery 2005, GERD, IBS, L wrist tendonitis (h/o surgery for Dequervains), obesity    Patient Stated Goals "to be able to stand long enough (up to 30 min) to cook and take care of things around the house & get back to using exercise equipment or walking for exercise"    Currently in Pain? No/denies                             [facility] Adult PT Treatment/Exercise - 04/09/20 1621      Neck Exercises: Theraband   Shoulder External Rotation 10 reps;Red    Shoulder External Rotation Limitations + scap retraction into pool noodle along spine in chair    Horizontal ABduction 10 reps;Red    Horizontal ABduction Limitations + scap retraction into pool noodle along spine in chair    Other Theraband Exercises Alt red TB UE diagonals + scap retraction into pool noodle along spine in chair 10 x  3"      Lumbar Exercises: Aerobic   Nustep L3 x 6 min (LE/UE)      Lumbar Exercises: Standing   Scapular Retraction Both;15 reps;Strengthening;Theraband   3" hold   Theraband Level (Scapular Retraction) Level 2 (Red)    Scapular Retraction Limitations + mini-shoulder extension; cues to keep chest upright and fwd     Row Both;15 reps;Strengthening;Theraband   3" hold   Theraband Level (Row) Level 2 (Red)    Row Limitations cues to stand back with arms extended to start & pull elbows back to sides while squeezing shoulder blades together    Other Standing Lumbar Exercises Serratus push-up from elbows on wall x 7      Lumbar Exercises: Supine   Bridge 10 reps;3 seconds                    PT Short Term Goals - 04/09/20 1630      PT SHORT TERM GOAL #1   Title Patient will be  independent with initial HEP    Status Achieved   04/02/20     PT SHORT TERM GOAL #2   Title Patient will verbalize/demonstrate understanding of neutral spine posture and proper body mechanics to reduce strain on thoracic spine    Status Achieved   04/09/20            PT Long Term Goals - 04/02/20 1739      PT LONG TERM GOAL #1   Title Patient will be independent with ongoing/advanced HEP    Status On-going      PT LONG TERM GOAL #2   Title Patient to demonstrate ability to achieve and maintain good spinal alignment/posturing    Status On-going      PT LONG TERM GOAL #3   Title Patient to report pain reduction in frequency and intensity by >/= 50%    Status On-going      PT LONG TERM GOAL #4   Title Patient will demonstrate improved B shoulder and LE strength to >/= 4+/5 for improved stability and ease of mobility    Status On-going      PT LONG TERM GOAL #5   Title Patient will improve standing tolerance to >/= 30 minutes w/o pain interference to allow resumption of normal daily activities    Status On-going                 Plan - 04/09/20 1631    Clinical Impression Statement Sarah Horton reporting good comfort with HEP and posture and body mechanics (STGs met) and feels that it is allowing to work at activities longer before she notes onset of pain but "too soon to declare victory". HEP review of TB rows requiring review of technique to avoid excessive shoulder extension and upward rotation of elbows. Good tolerance for progression of scapular strengthening/stabilization with red TB but limited endurance with serratus pushups from elbows on wall.    Comorbidities Psoriatic arthritis, OA, B TKA 2011, venous insufficiency, h/o venous statis ulcers, Raynaud's disease, peripheral neuropathy, osteopenia, R shoulder surgery 2005, GERD, IBS, L wrist tendonitis (h/o surgery for Dequervains), obesity    Rehab Potential Good    PT Frequency 2x / week    PT Duration 6 weeks    PT  Treatment/Interventions ADLs/Self Care Home Management;Electrical Stimulation;Iontophoresis 21m/ml Dexamethasone;Moist Heat;Ultrasound;Gait training;Functional mobility training;Therapeutic activities;Therapeutic exercise;Balance training;Neuromuscular re-education;Patient/family education;Manual techniques;Passive range of motion;Dry needling;Taping;Spinal Manipulations;Joint Manipulations    PT Next Visit Plan Thoracolumbar mobility/flexibility; core/proximal UE/LE strengthening;  manual therapy and modalities PRN for pain    PT Home Exercise Plan 10/18 - thoracic extension mobs, hip flexor stretch; 10/27 - bridge, mod thomas hip flexor stretch, red TB row    Consulted and Agree with Plan of Care Patient           Patient will benefit from skilled therapeutic intervention in order to improve the following deficits and impairments:  Decreased activity tolerance, Decreased endurance, Decreased range of motion, Decreased strength, Difficulty walking, Impaired perceived functional ability, Impaired flexibility, Improper body mechanics, Postural dysfunction, Pain  Visit Diagnosis: Pain in thoracic spine  Muscle weakness (generalized)     Problem List Patient Active Problem List   Diagnosis Date Noted  . History of peripheral neuropathy 11/20/2016  . Left wrist tendonitis/ history of surgery for Dequervains  11/20/2016  . Raynaud's disease without gangrene 09/22/2016  . High risk medication use 09/22/2016  . History of ulcer of lower limb/ Venous stasis ulcer 01/2015  09/22/2016  . Osteopenia of multiple sites 09/22/2016  . Psoriasis 09/03/2016  . Chest wall pain 07/16/2016  . Flank pain, acute 05/15/2016  . Subacromial bursitis of right shoulder joint 04/10/2016  . Low back pain 04/09/2016  . Pre-ulcerative calluses 02/11/2016  . Varicose veins of right lower extremity with complications 35/52/1747  . Elevated BP 08/09/2015  . Varicose veins of left lower extremity with complications  15/95/3967  . Varicose veins of bilateral lower extremities with other complications 28/97/9150  . Varicose veins of lower extremities with ulcer ([place]) 04/05/2015  . Status post bilateral knee replacements 12/13/2013  . Venous stasis ulcer of ankle ([place]) 06/06/2013  . ADD (attention deficit disorder) 04/04/2012  . INSOMNIA 06/20/2010  . Hypothyroidism 01/17/2010  . Obesity 01/17/2010  . Osteoarthritis 01/17/2010  . ANEMIA 10/02/2009  . SINUSITIS, ACUTE 10/02/2009  . Asthma 10/02/2009  . Venous (peripheral) insufficiency 03/11/2008  . Allergic rhinitis 03/11/2008  . BRONCHITIS, RECURRENT 03/11/2008  . History of migraine 03/11/2008  . Anxiety state 11/24/2007  . Hereditary and idiopathic peripheral neuropathy 11/24/2007  . GERD 11/24/2007  . IRRITABLE BOWEL SYNDROME 11/24/2007  . PSORIATIC ARTHROPATHY 11/24/2007    Percival Spanish, PT, MPT 04/09/2020, 5:00 PM  [facility] [address]  [place], [place], [address] Phone: [phone]   Fax:  [phone]  Name: Sarah Horton MRN: [id number] Date of Birth: [birth year]

## 2020-04-11 ENCOUNTER — Ambulatory Visit: Payer: BC Managed Care – PPO

## 2020-04-15 ENCOUNTER — Encounter: Payer: Self-pay | Admitting: Physical Therapy

## 2020-04-15 ENCOUNTER — Ambulatory Visit: Payer: BC Managed Care – PPO | Admitting: Physical Therapy

## 2020-04-15 ENCOUNTER — Other Ambulatory Visit: Payer: Self-pay

## 2020-04-15 DIAGNOSIS — M546 Pain in thoracic spine: Secondary | ICD-10-CM

## 2020-04-15 DIAGNOSIS — M6281 Muscle weakness (generalized): Secondary | ICD-10-CM

## 2020-04-15 MED FILL — ENBREL MINI 50 MG/ML SOCT: 50 | 28 days supply | Qty: 4 | Fill #2

## 2020-04-15 NOTE — Patient Instructions (Signed)
    Home exercise program created by Tery Hoeger, PT.  For questions, please contact Gaje Tennyson via phone at [phone] or email at [email or web address]  [facility] [place] [address]  [address] [place], [place], [address] Phone: [phone]   Fax:  [phone]    

## 2020-04-15 NOTE — Therapy (Signed)
[facility] [address]  [address] [place], [place], [address] Phone: [phone]   Fax:  [phone]  Physical Therapy Treatment  Patient Details  Name: Sarah Horton MRN: [id number] Date of Birth: [birth year] Referring Provider (PT): Sherron Ales, PA-C   Encounter Date: 04/15/2020   PT End of Session - 04/15/20 1627    Visit Number 4    Number of Visits 12    Date for PT Re-Evaluation 05/06/20    Authorization Type BCBS    PT Start Time 1627   Pt arrived late   PT Stop Time 1658    PT Time Calculation (min) 31 min    Activity Tolerance Patient tolerated treatment well    Behavior During Therapy [facility] for tasks assessed/performed           Past Medical History:  Diagnosis Date  . Allergic rhinitis, cause unspecified   . Allergy   . Anemia, unspecified   . Anxiety state, unspecified   . Arthritis   . Cataract    bilaterally removed 2003,2007  . Esophageal reflux   . Irritable bowel syndrome   . Migraine   . Obesity, unspecified   . Psoriatic arthropathy ([facility])   . Unspecified chronic bronchitis ([facility])   . Unspecified hereditary and idiopathic peripheral neuropathy   . Unspecified hypothyroidism   . Unspecified venous (peripheral) insufficiency     Past Surgical History:  Procedure Laterality Date  . ABDOMINAL HYSTERECTOMY    . ABDOMINAL HYSTERECTOMY    . ABLATION ON ENDOMETRIOSIS     x2  . Bilat TKRs  04/2010   by [name]  . BUNIONECTOMY  2009  . CATARACT EXTRACTION, BILATERAL     [id number]  . COLONOSCOPY  08-26-2004   with gessner tics and hems   . dequervains tensenovitis Left   . JOINT REPLACEMENT Bilateral    knees  . NASAL SINUS SURGERY    . SHOULDER SURGERY  2005  . TOTAL SHOULDER ARTHROPLASTY      There were no vitals filed for this visit.   Subjective Assessment - 04/15/20 1630    Subjective Pt denies pain today other than work irritation.    Pertinent History Psoriatic  arthritis, OA, B TKA 2011, venous insufficiency, h/o venous statis ulcers, Raynaud's disease, peripheral neuropathy, osteopenia, R shoulder surgery 2005, GERD, IBS, L wrist tendonitis (h/o surgery for Dequervains), obesity    Patient Stated Goals "to be able to stand long enough (up to 30 min) to cook and take care of things around the house & get back to using exercise equipment or walking for exercise"    Currently in Pain? No/denies                             [facility] Adult PT Treatment/Exercise - 04/15/20 1627      Neck Exercises: Theraband   Shoulder External Rotation 15 reps;Red    Shoulder External Rotation Limitations + scap retraction into 1/2 FR on wall    Horizontal ABduction 15 reps;Red    Horizontal ABduction Limitations + scap retraction into 1/2 FR on wall; cues to avoid shoulder shrug    Other Theraband Exercises Alt red TB UE diagonals + scap retraction into 1/2 FR on wall 10 x 3"    Other Theraband Exercises Red TB "W" row 10 x 5"      Lumbar Exercises: Aerobic   Nustep L3 x 4 min (LE/UE)  Shoulder Exercises: Prone   Extension Both;10 reps;AROM;Strengthening    Extension Limitations I's leaning over green Pball on mat table    Horizontal ABduction 1 Both;10 reps;AROM;Strengthening    Horizontal ABduction 1 Limitations T's leaning over green Pball on mat table    Horizontal ABduction 2 Both;10 reps;AROM;Strengthening    Horizontal ABduction 2 Limitations Y's leaning over green Pball on mat table                  PT Education - 04/15/20 1658    Education Details HEP update - scap retraction + shoulder extension & ER    Person(s) Educated Patient    Methods Explanation;Demonstration;Verbal cues;Handout    Comprehension Verbalized understanding;Verbal cues required;Returned demonstration            PT Short Term Goals - 04/09/20 1630      PT SHORT TERM GOAL #1   Title Patient will be independent with initial HEP    Status Achieved    04/02/20     PT SHORT TERM GOAL #2   Title Patient will verbalize/demonstrate understanding of neutral spine posture and proper body mechanics to reduce strain on thoracic spine    Status Achieved   04/09/20            PT Long Term Goals - 04/02/20 1739      PT LONG TERM GOAL #1   Title Patient will be independent with ongoing/advanced HEP    Status On-going      PT LONG TERM GOAL #2   Title Patient to demonstrate ability to achieve and maintain good spinal alignment/posturing    Status On-going      PT LONG TERM GOAL #3   Title Patient to report pain reduction in frequency and intensity by >/= 50%    Status On-going      PT LONG TERM GOAL #4   Title Patient will demonstrate improved B shoulder and LE strength to >/= 4+/5 for improved stability and ease of mobility    Status On-going      PT LONG TERM GOAL #5   Title Patient will improve standing tolerance to >/= 30 minutes w/o pain interference to allow resumption of normal daily activities    Status On-going                 Plan - 04/15/20 1632    Clinical Impression Statement [name] denies pain today and reports no issue with HEP.  Continued emphasis on postural strengthening targeting core and upper back with progression of scapular retraction exercises to standing as well as introduction of prone thoracic extension + scapular retraction over green Pball on mat table - pt denies pain but notes fatigue with most exercises although feels her stamina is improving.  HEP updated to include some progression of scapular strengthening. Visit time limited by pt's late arrival.    Comorbidities Psoriatic arthritis, OA, B TKA 2011, venous insufficiency, h/o venous statis ulcers, Raynaud's disease, peripheral neuropathy, osteopenia, R shoulder surgery 2005, GERD, IBS, L wrist tendonitis (h/o surgery for Dequervains), obesity    Rehab Potential Good    PT Frequency 2x / week    PT Duration 6 weeks    PT  Treatment/Interventions ADLs/Self Care Home Management;Electrical Stimulation;Iontophoresis 4mg /ml Dexamethasone;Moist Heat;Ultrasound;Gait training;Functional mobility training;Therapeutic activities;Therapeutic exercise;Balance training;Neuromuscular re-education;Patient/family education;Manual techniques;Passive range of motion;Dry needling;Taping;Spinal Manipulations;Joint Manipulations    PT Next Visit Plan Thoracolumbar mobility/flexibility; core/proximal UE/LE strengthening; manual therapy and modalities PRN for pain    PT Home  Exercise Plan 10/18 - thoracic extension mobs, hip flexor stretch; 10/27 - bridge, mod thomas hip flexor stretch, red TB row    Consulted and Agree with Plan of Care Patient           Patient will benefit from skilled therapeutic intervention in order to improve the following deficits and impairments:  Decreased activity tolerance, Decreased endurance, Decreased range of motion, Decreased strength, Difficulty walking, Impaired perceived functional ability, Impaired flexibility, Improper body mechanics, Postural dysfunction, Pain  Visit Diagnosis: Pain in thoracic spine  Muscle weakness (generalized)     Problem List Patient Active Problem List   Diagnosis Date Noted  . History of peripheral neuropathy 11/20/2016  . Left wrist tendonitis/ history of surgery for Dequervains  11/20/2016  . Raynaud's disease without gangrene 09/22/2016  . High risk medication use 09/22/2016  . History of ulcer of lower limb/ Venous stasis ulcer 01/2015  09/22/2016  . Osteopenia of multiple sites 09/22/2016  . Psoriasis 09/03/2016  . Chest wall pain 07/16/2016  . Flank pain, acute 05/15/2016  . Subacromial bursitis of right shoulder joint 04/10/2016  . Low back pain 04/09/2016  . Pre-ulcerative calluses 02/11/2016  . Varicose veins of right lower extremity with complications 08/12/2015  . Elevated BP 08/09/2015  . Varicose veins of left lower extremity with complications  07/29/2015  . Varicose veins of bilateral lower extremities with other complications 07/09/2015  . Varicose veins of lower extremities with ulcer ([facility]) 04/05/2015  . Status post bilateral knee replacements 12/13/2013  . Venous stasis ulcer of ankle ([facility]) 06/06/2013  . ADD (attention deficit disorder) 04/04/2012  . INSOMNIA 06/20/2010  . Hypothyroidism 01/17/2010  . Obesity 01/17/2010  . Osteoarthritis 01/17/2010  . ANEMIA 10/02/2009  . SINUSITIS, ACUTE 10/02/2009  . Asthma 10/02/2009  . Venous (peripheral) insufficiency 03/11/2008  . Allergic rhinitis 03/11/2008  . BRONCHITIS, RECURRENT 03/11/2008  . History of migraine 03/11/2008  . Anxiety state 11/24/2007  . Hereditary and idiopathic peripheral neuropathy 11/24/2007  . GERD 11/24/2007  . IRRITABLE BOWEL SYNDROME 11/24/2007  . PSORIATIC ARTHROPATHY 11/24/2007    Marry Guan, PT, MTP 04/15/2020, 6:19 PM  [facility] [address]  [address] [place], [place], [address] Phone: [phone]   Fax:  [phone]  Name: Sarah Horton MRN: [id number] Date of Birth: 11/21/1950

## 2020-04-16 ENCOUNTER — Other Ambulatory Visit: Payer: Self-pay | Admitting: Rheumatology

## 2020-04-16 DIAGNOSIS — L4050 Arthropathic psoriasis, unspecified: Secondary | ICD-10-CM

## 2020-04-16 NOTE — Telephone Encounter (Signed)
Last Visit: 03/11/2020 Next Visit: 06/18/2020  Labs: 03/06/2020 GFR is low but is stable. CBC stable.  Current Dose per office note on 03/11/2020: Arava 10 mg 1 tablet by mouth daily  Dx: Psoriatic arthropathy   Okay to refill Arava?

## 2020-04-18 ENCOUNTER — Ambulatory Visit: Payer: BC Managed Care – PPO

## 2020-04-22 ENCOUNTER — Other Ambulatory Visit: Payer: Self-pay

## 2020-04-22 ENCOUNTER — Ambulatory Visit: Payer: BC Managed Care – PPO | Admitting: Physical Therapy

## 2020-04-22 ENCOUNTER — Encounter: Payer: Self-pay | Admitting: Physical Therapy

## 2020-04-22 DIAGNOSIS — M6281 Muscle weakness (generalized): Secondary | ICD-10-CM

## 2020-04-22 DIAGNOSIS — F902 Attention-deficit hyperactivity disorder, combined type: Secondary | ICD-10-CM | POA: Diagnosis not present

## 2020-04-22 DIAGNOSIS — F331 Major depressive disorder, recurrent, moderate: Secondary | ICD-10-CM | POA: Diagnosis not present

## 2020-04-22 DIAGNOSIS — M546 Pain in thoracic spine: Secondary | ICD-10-CM

## 2020-04-22 DIAGNOSIS — F411 Generalized anxiety disorder: Secondary | ICD-10-CM | POA: Diagnosis not present

## 2020-04-22 DIAGNOSIS — F422 Mixed obsessional thoughts and acts: Secondary | ICD-10-CM | POA: Diagnosis not present

## 2020-04-22 NOTE — Therapy (Signed)
Sarah Horton [address]  [place], [place], [address] Phone: [phone]   Fax:  [phone]  Physical Therapy Treatment  Patient Details  Name: Sarah Horton MRN: [id number] Date of Birth: 1951/05/19 Referring Provider (PT): Sarah Sams, PA-C   Encounter Date: 04/22/2020   PT End of Session - 04/22/20 1617    Visit Number 5    Number of Visits 12    Date for PT Re-Evaluation 05/06/20    Authorization Type BCBS    PT Start Time 4403    PT Stop Time 1701    PT Time Calculation (min) 44 min    Activity Tolerance Patient tolerated treatment well    Behavior During Therapy Sarah Horton for tasks assessed/performed           Past Medical History:  Diagnosis Date  . Allergic rhinitis, cause unspecified   . Allergy   . Anemia, unspecified   . Anxiety state, unspecified   . Arthritis   . Cataract    bilaterally removed 2003,2007  . Esophageal reflux   . Irritable bowel syndrome   . Migraine   . Obesity, unspecified   . Psoriatic arthropathy ([place])   . Unspecified chronic bronchitis ([place])   . Unspecified hereditary and idiopathic peripheral neuropathy   . Unspecified hypothyroidism   . Unspecified venous (peripheral) insufficiency     Past Surgical History:  Procedure Laterality Date  . ABDOMINAL HYSTERECTOMY    . ABDOMINAL HYSTERECTOMY    . ABLATION ON ENDOMETRIOSIS     x2  . Bilat TKRs  04/2010   by [name]  . BUNIONECTOMY  2009  . CATARACT EXTRACTION, BILATERAL     [id number]  . COLONOSCOPY  08-26-2004   with gessner tics and hems   . dequervains tensenovitis Left   . JOINT REPLACEMENT Bilateral    knees  . NASAL SINUS SURGERY    . SHOULDER SURGERY  2005  . TOTAL SHOULDER ARTHROPLASTY      There were no vitals filed for this visit.   Subjective Assessment - 04/22/20 1622    Subjective Pt reports increased pain earlier today after standing for a long time while putting things away and  breaking down boxes from an [employer] order - had been standing over 1 hr before the pain started.    Pertinent History Psoriatic arthritis, OA, B TKA 2011, venous insufficiency, h/o venous statis ulcers, Raynaud's disease, peripheral neuropathy, osteopenia, R shoulder surgery 2005, GERD, IBS, L wrist tendonitis (h/o surgery for Dequervains), obesity    How long can you stand comfortably? 1 hr    Patient Stated Goals "to be able to stand long enough (up to 30 min) to cook and take care of things around the house & get back to using exercise equipment or walking for exercise"    Currently in Pain? Yes    Pain Score 3    2-3/10 currently; up to 8/10 earlier today   Pain Location Thoracic    Pain Orientation Left                             [facility] Adult PT Treatment/Exercise - 04/22/20 1617      Neck Exercises: Theraband   Shoulder Extension 10 reps;Red    Shoulder Extension Limitations seated on dynadisc with feet on Airex pad    Rows 10 reps;Red    Rows Limitations seated on dynadisc with feet on Airex  pad    Shoulder External Rotation 15 reps;Red    Shoulder External Rotation Limitations + scap retraction into 1/2 FR on wall    Horizontal ABduction 15 reps;Red    Horizontal ABduction Limitations + scap retraction into 1/2 FR on wall; cues to avoid shoulder shrug    Other Theraband Exercises Alt red TB UE diagonals + scap retraction into 1/2 FR on wall 15 x 3"      Lumbar Exercises: Aerobic   Recumbent Bike L1 x 6 min      Lumbar Exercises: Seated   Other Seated Lumbar Exercises R/L red TB pallof press 10 x 3", seated on dynadisc with feet on Airex pad      Shoulder Exercises: Prone   Extension Both;10 reps;AROM;Strengthening    Extension Limitations I's leaning over green Pball on mat table    External Rotation Both;10 reps;AROM;Strengthening    External Rotation Limitations W's leaning over green Pball on mat table    Horizontal ABduction 1 Both;10  reps;AROM;Strengthening    Horizontal ABduction 1 Limitations T's leaning over green Pball on mat table    Horizontal ABduction 2 Both;10 reps;AROM;Strengthening    Horizontal ABduction 2 Limitations Y's leaning over green Pball on mat table                    PT Short Term Goals - 04/09/20 1630      PT SHORT TERM GOAL #1   Title Patient will be independent with initial HEP    Status Achieved   04/02/20     PT SHORT TERM GOAL #2   Title Patient will verbalize/demonstrate understanding of neutral spine posture and proper body mechanics to reduce strain on thoracic spine    Status Achieved   04/09/20            PT Long Term Goals - 04/22/20 1626      PT LONG TERM GOAL #1   Title Patient will be independent with ongoing/advanced HEP    Status Partially Met    Target Date 05/06/20      PT LONG TERM GOAL #2   Title Patient to demonstrate ability to achieve and maintain good spinal alignment/posturing    Status Partially Met    Target Date 05/06/20      PT LONG TERM GOAL #3   Title Patient to report pain reduction in frequency and intensity by >/= 50%    Status Achieved   04/22/20 - pt reporting 70% reduction in pain frequency and intensity     PT LONG TERM GOAL #4   Title Patient will demonstrate improved B shoulder and LE strength to >/= 4+/5 for improved stability and ease of mobility    Status On-going    Target Date 05/06/20      PT LONG TERM GOAL #5   Title Patient will improve standing tolerance to >/= 30 minutes w/o pain interference to allow resumption of normal daily activities    Status Achieved   04/22/20 - able to stand for >1 hr before onset of pain                Plan - 04/22/20 1639    Clinical Impression Statement Sarah Horton report 70% reduction in pain frequency and intensity with improving activity tolerance including standing tolerance up to >1 hr before onset of pain - STGs # 3 & 5 met. She tries to utilize posture and body mechanics but  admits that she did work from table height  while standing for some of what she was doing earlier today. Pt feels that fatigue due to ongoing muscle weakness is what continues to lead to increased pain, therefore continued focus on postural, core and lumbopelvic strengthening incorporating unstable surfaces to increase abdominal activation - pt noting better awareness of core activation with exercises seated on dynadisc.    Comorbidities Psoriatic arthritis, OA, B TKA 2011, venous insufficiency, h/o venous statis ulcers, Raynaud's disease, peripheral neuropathy, osteopenia, R shoulder surgery 2005, GERD, IBS, L wrist tendonitis (h/o surgery for Dequervains), obesity    Rehab Potential Good    PT Frequency 2x / week    PT Duration 6 weeks    PT Treatment/Interventions ADLs/Self Care Home Management;Electrical Stimulation;Iontophoresis 64m/ml Dexamethasone;Moist Heat;Ultrasound;Gait training;Functional mobility training;Therapeutic activities;Therapeutic exercise;Balance training;Neuromuscular re-education;Patient/family education;Manual techniques;Passive range of motion;Dry needling;Taping;Spinal Manipulations;Joint Manipulations    PT Next Visit Plan Thoracolumbar mobility/flexibility; core/proximal UE/LE strengthening; manual therapy and modalities PRN for pain    PT Home Exercise Plan 10/18 - thoracic extension mobs, hip flexor stretch; 10/27 - bridge, mod thomas hip flexor stretch, red TB row; 11/8 - scap retraction + red TB retraction/extension & ER    Consulted and Agree with Plan of Care Patient           Patient will benefit from skilled therapeutic intervention in order to improve the following deficits and impairments:  Decreased activity tolerance, Decreased endurance, Decreased range of motion, Decreased strength, Difficulty walking, Impaired perceived functional ability, Impaired flexibility, Improper body mechanics, Postural dysfunction, Pain  Visit Diagnosis: Pain in thoracic  spine  Muscle weakness (generalized)     Problem List Patient Active Problem List   Diagnosis Date Noted  . History of peripheral neuropathy 11/20/2016  . Left wrist tendonitis/ history of surgery for Dequervains  11/20/2016  . Raynaud's disease without gangrene 09/22/2016  . High risk medication use 09/22/2016  . History of ulcer of lower limb/ Venous stasis ulcer 01/2015  09/22/2016  . Osteopenia of multiple sites 09/22/2016  . Psoriasis 09/03/2016  . Chest wall pain 07/16/2016  . Flank pain, acute 05/15/2016  . Subacromial bursitis of right shoulder joint 04/10/2016  . Low back pain 04/09/2016  . Pre-ulcerative calluses 02/11/2016  . Varicose veins of right lower extremity with complications 025/85/2778 . Elevated BP 08/09/2015  . Varicose veins of left lower extremity with complications 024/23/5361 . Varicose veins of bilateral lower extremities with other complications 044/31/5400 . Varicose veins of lower extremities with ulcer ([place] 04/05/2015  . Status post bilateral knee replacements 12/13/2013  . Venous stasis ulcer of ankle ([place] 06/06/2013  . ADD (attention deficit disorder) 04/04/2012  . INSOMNIA 06/20/2010  . Hypothyroidism 01/17/2010  . Obesity 01/17/2010  . Osteoarthritis 01/17/2010  . ANEMIA 10/02/2009  . SINUSITIS, ACUTE 10/02/2009  . Asthma 10/02/2009  . Venous (peripheral) insufficiency 03/11/2008  . Allergic rhinitis 03/11/2008  . BRONCHITIS, RECURRENT 03/11/2008  . History of migraine 03/11/2008  . Anxiety state 11/24/2007  . Hereditary and idiopathic peripheral neuropathy 11/24/2007  . GERD 11/24/2007  . IRRITABLE BOWEL SYNDROME 11/24/2007  . PSORIATIC ARTHROPATHY 11/24/2007    JPercival Spanish PT, MPT 04/22/2020, 7:15 PM  [facility] [place] [address]Phone: [phone]  Fax:  [phone] Name: Sarah PINARDMRN: [id number] of Birth:  [birth year]

## 2020-04-25 ENCOUNTER — Ambulatory Visit: Payer: BC Managed Care – PPO

## 2020-04-29 ENCOUNTER — Other Ambulatory Visit: Payer: Self-pay | Admitting: Physician Assistant

## 2020-04-29 ENCOUNTER — Encounter: Payer: Self-pay | Admitting: Physical Therapy

## 2020-04-29 ENCOUNTER — Ambulatory Visit: Payer: BC Managed Care – PPO | Admitting: Physical Therapy

## 2020-04-29 ENCOUNTER — Other Ambulatory Visit: Payer: Self-pay

## 2020-04-29 DIAGNOSIS — M546 Pain in thoracic spine: Secondary | ICD-10-CM

## 2020-04-29 DIAGNOSIS — M6281 Muscle weakness (generalized): Secondary | ICD-10-CM

## 2020-04-29 NOTE — Patient Instructions (Signed)
    Home exercise program created by Kellon Chalk, PT.  For questions, please contact Verna Hamon via phone at [phone] or email at [email or web address]  [facility] [place] [address]  [address] [place], [place], [address] Phone: [phone]   Fax:  [phone]    

## 2020-04-29 NOTE — Therapy (Addendum)
[facility] [place] [address]  [place], [place], [address] Phone: [phone]   Fax:  [phone]  Physical Therapy Treatment / Discharge Summary  Patient Details  Name: Sarah Horton MRN: [id number] Date of Birth: [birth year] Referring Provider (PT): Hazel Sams, PA-C   Encounter Date: 04/29/2020   PT End of Session - 04/29/20 1407    Visit Number 6    Number of Visits 12    Date for PT Re-Evaluation 05/06/20    Authorization Type BCBS    PT Start Time 5809    PT Stop Time 9833    PT Time Calculation (min) 51 min    Activity Tolerance Patient tolerated treatment well    Behavior During Therapy [facility] for tasks assessed/performed           Past Medical History:  Diagnosis Date  . Allergic rhinitis, cause unspecified   . Allergy   . Anemia, unspecified   . Anxiety state, unspecified   . Arthritis   . Cataract    bilaterally removed 2003,2007  . Esophageal reflux   . Irritable bowel syndrome   . Migraine   . Obesity, unspecified   . Psoriatic arthropathy ([place])   . Unspecified chronic bronchitis ([place])   . Unspecified hereditary and idiopathic peripheral neuropathy   . Unspecified hypothyroidism   . Unspecified venous (peripheral) insufficiency     Past Surgical History:  Procedure Laterality Date  . ABDOMINAL HYSTERECTOMY    . ABDOMINAL HYSTERECTOMY    . ABLATION ON ENDOMETRIOSIS     x2  . Bilat TKRs  04/2010   by [name]  . BUNIONECTOMY  2009  . CATARACT EXTRACTION, BILATERAL     [id number]  . COLONOSCOPY  08-26-2004   with gessner tics and hems   . dequervains tensenovitis Left   . JOINT REPLACEMENT Bilateral    knees  . NASAL SINUS SURGERY    . SHOULDER SURGERY  2005  . TOTAL SHOULDER ARTHROPLASTY      There were no vitals filed for this visit.   Subjective Assessment - 04/29/20 1610    Subjective Pt reports a problem with pain in her R knee and the 3rd finger of her L hand but  denies upper back pain today.    Pertinent History Psoriatic arthritis, OA, B TKA 2011, venous insufficiency, h/o venous statis ulcers, Raynaud's disease, peripheral neuropathy, osteopenia, R shoulder surgery 2005, GERD, IBS, L wrist tendonitis (h/o surgery for Dequervains), obesity    How long can you stand comfortably? 1 hr    Patient Stated Goals "to be able to stand long enough (up to 30 min) to cook and take care of things around the house & get back to using exercise equipment or walking for exercise"    Currently in Pain? Yes    Pain Score 0-No pain    Pain Location Thoracic    Pain Score 0   4-5/10 when she bends her knee   Pain Location Knee    Pain Orientation Right;Anterior;Lower    Pain Descriptors / Indicators Dull;Aching    Pain Type Acute pain    Pain Onset In the past 7 days   last night   Aggravating Factors  bending her knee              [facility] PT Assessment - 04/29/20 1607      Assessment   Medical Diagnosis Thoracic back pain & muscular deconditioning    Referring Provider (PT)  Hazel Sams, PA-C    Onset Date/Surgical Date --   April/May 2021   Hand Dominance Left    Next MD Visit 06/18/20 - Dr. Estanislado Pandy      Observation/Other Assessments   Focus on Therapeutic Outcomes (FOTO)  Thoracic - 99% (1% limitation)      AROM   Lumbar Flexion fingertips to toes    Lumbar Extension WFL    Lumbar - Right Side Bend hand to fibular head    Lumbar - Left Side Bend hand to mid calf    Lumbar - Right Rotation 20% limited    Lumbar - Left Rotation 20% limited      Strength   Right Shoulder Flexion 4+/5    Right Shoulder ABduction 4+/5    Right Shoulder Internal Rotation 5/5    Right Shoulder External Rotation 5/5    Left Shoulder Flexion 5/5    Left Shoulder ABduction 5/5    Left Shoulder Internal Rotation 5/5    Left Shoulder External Rotation 5/5    Right Hip Flexion 4+/5    Right Hip Extension 4/5    Right Hip External Rotation  4+/5    Right Hip Internal  Rotation 4+/5    Right Hip ABduction 4-/5    Right Hip ADduction 4+/5    Left Hip Flexion 4+/5    Left Hip Extension 4/5    Left Hip External Rotation 5/5    Left Hip Internal Rotation 5/5    Left Hip ABduction 4/5    Left Hip ADduction 4+/5    Right Knee Flexion 5/5    Right Knee Extension 5/5    Left Knee Flexion 5/5    Left Knee Extension 5/5    Right Ankle Dorsiflexion 5/5    Right Ankle Plantar Flexion 4/5    Left Ankle Dorsiflexion 5/5    Left Ankle Plantar Flexion 4/5                         [facility] Adult PT Treatment/Exercise - 04/29/20 1607      Lumbar Exercises: Aerobic   Nustep L4 x 6 min (LE/UE)      Lumbar Exercises: Standing   Other Standing Lumbar Exercises Side stepping with looped red TB at knees 2 x 15'      Lumbar Exercises: Supine   Bridge with clamshell 10 reps;5 seconds    Bridge with Ball Squeeze Limitations red TB      Lumbar Exercises: Sidelying   Clam Right;Left;10 reps;3 seconds    Clam Limitations red TB                  PT Education - 04/29/20 1658    Education Details HEP update - targeting remaining hip weakness    Person(s) Educated Patient    Methods Explanation;Demonstration;Handout    Comprehension Verbalized understanding;Returned demonstration            PT Short Term Goals - 04/09/20 1630      PT SHORT TERM GOAL #1   Title Patient will be independent with initial HEP    Status Achieved   04/02/20     PT SHORT TERM GOAL #2   Title Patient will verbalize/demonstrate understanding of neutral spine posture and proper body mechanics to reduce strain on thoracic spine    Status Achieved   04/09/20            PT Long Term Goals - 04/29/20 1617  PT LONG TERM GOAL #1   Title Patient will be independent with ongoing/advanced HEP    Status Achieved   04/29/20     PT LONG TERM GOAL #2   Title Patient to demonstrate ability to achieve and maintain good spinal alignment/posturing    Status Achieved    04/29/20     PT LONG TERM GOAL #3   Title Patient to report pain reduction in frequency and intensity by >/= 50%    Status Achieved   04/22/20 - pt reporting 70% reduction in pain frequency and intensity     PT LONG TERM GOAL #4   Title Patient will demonstrate improved B shoulder and LE strength to >/= 4+/5 for improved stability and ease of mobility    Status Partially Met      PT LONG TERM GOAL #5   Title Patient will improve standing tolerance to >/= 30 minutes w/o pain interference to allow resumption of normal daily activities    Status Achieved   04/22/20 - able to stand for >1 hr before onset of pain                Plan - 04/29/20 1658    Clinical Impression Statement Sarah Horton reports >70% reduction in pain frequency and intensity noting it now takes much longer and more intense activity to trigger any thoracic/upper back pain. She feels that improving postural awareness and strength has allowed for improving activity tolerance. Her B shoulder strength is now 4+/5 to 5/5 and B LE grossly 4+/5 to 5/5 with exception of B hip extension & L hip abduction at 4/5 with R hip abduction at 4-/5. HEP update provided with exercises to target remaining hip weakness. All goals now met except strength goal only partially met. Sarah Horton feels confident to try transitioning to her HEP at this time would like to remain on hold for 30 days in the event she would need to return to PT.    Comorbidities Psoriatic arthritis, OA, B TKA 2011, venous insufficiency, h/o venous statis ulcers, Raynaud's disease, peripheral neuropathy, osteopenia, R shoulder surgery 2005, GERD, IBS, L wrist tendonitis (h/o surgery for Dequervains), obesity    Rehab Potential Good    PT Treatment/Interventions ADLs/Self Care Home Management;Electrical Stimulation;Iontophoresis 46m/ml Dexamethasone;Moist Heat;Ultrasound;Gait training;Functional mobility training;Therapeutic activities;Therapeutic exercise;Balance  training;Neuromuscular re-education;Patient/family education;Manual techniques;Passive range of motion;Dry needling;Taping;Spinal Manipulations;Joint Manipulations    PT Next Visit Plan 30-day hold    PT Home Exercise Plan 10/18 - thoracic extension mobs, hip flexor stretch; 10/27 - bridge, mod thomas hip flexor stretch, red TB row; 11/8 - scap retraction + red TB retraction/extension & ER; 11/22 - red TB bridge clam & sidelying clam, red TB side stepping    Consulted and Agree with Plan of Care Patient           Patient will benefit from skilled therapeutic intervention in order to improve the following deficits and impairments:  Decreased activity tolerance, Decreased endurance, Decreased range of motion, Decreased strength, Difficulty walking, Impaired perceived functional ability, Impaired flexibility, Improper body mechanics, Postural dysfunction, Pain  Visit Diagnosis: Pain in thoracic spine  Muscle weakness (generalized)     Problem List Patient Active Problem List   Diagnosis Date Noted  . History of peripheral neuropathy 11/20/2016  . Left wrist tendonitis/ history of surgery for Dequervains  11/20/2016  . Raynaud's disease without gangrene 09/22/2016  . High risk medication use 09/22/2016  . History of ulcer of lower limb/ Venous stasis ulcer 01/2015  09/22/2016  .  Osteopenia of multiple sites 09/22/2016  . Psoriasis 09/03/2016  . Chest wall pain 07/16/2016  . Flank pain, acute 05/15/2016  . Subacromial bursitis of right shoulder joint 04/10/2016  . Low back pain 04/09/2016  . Pre-ulcerative calluses 02/11/2016  . Varicose veins of right lower extremity with complications 68/37/2902  . Elevated BP 08/09/2015  . Varicose veins of left lower extremity with complications 04/23/5207  . Varicose veins of bilateral lower extremities with other complications 07/31/3610  . Varicose veins of lower extremities with ulcer ([place]) 04/05/2015  . Status post bilateral knee  replacements 12/13/2013  . Venous stasis ulcer of ankle ([place]) 06/06/2013  . ADD (attention deficit disorder) 04/04/2012  . INSOMNIA 06/20/2010  . Hypothyroidism 01/17/2010  . Obesity 01/17/2010  . Osteoarthritis 01/17/2010  . ANEMIA 10/02/2009  . SINUSITIS, ACUTE 10/02/2009  . Asthma 10/02/2009  . Venous (peripheral) insufficiency 03/11/2008  . Allergic rhinitis 03/11/2008  . BRONCHITIS, RECURRENT 03/11/2008  . History of migraine 03/11/2008  . Anxiety state 11/24/2007  . Hereditary and idiopathic peripheral neuropathy 11/24/2007  . GERD 11/24/2007  . IRRITABLE BOWEL SYNDROME 11/24/2007  . PSORIATIC ARTHROPATHY 11/24/2007    Percival Spanish, PT, MPT 04/29/2020, 7:01 PM  [facility] [address]  [place], [place], [address] Phone: [phone]   Fax:  [phone]  Name: Sarah Horton MRN: [id number] Date of Birth: 11-Mar-1951   PHYSICAL THERAPY DISCHARGE SUMMARY  Visits from Start of Care: 6  Current functional level related to goals / functional outcomes:   Refer to above clinical impression for status as of last visit on 04/29/2020. Patient was placed on hold for 30 days and has not needed to return to PT, therefore will proceed with discharge from PT for this episode.   Remaining deficits:   As above.    Education / Equipment:   HEP, Biomedical scientist education  Plan: Patient agrees to discharge.  Patient goals were mostly met. Patient is being discharged due to meeting the stated rehab goals.  ?????     Percival Spanish, PT, MPT 06/11/20, 10:29 AM  [facility] [address]  [place], [place], [address] Phone: [phone]   Fax:  [phone]

## 2020-04-29 NOTE — Telephone Encounter (Signed)
Last Visit:03/11/2020 Next Visit:06/18/2020   Last Fill: 03/19/2020  Okay to refill Methocarbamol?

## 2020-05-13 ENCOUNTER — Other Ambulatory Visit: Payer: Self-pay | Admitting: Physician Assistant

## 2020-05-13 ENCOUNTER — Other Ambulatory Visit: Payer: Self-pay | Admitting: Pharmacist

## 2020-05-13 DIAGNOSIS — L4050 Arthropathic psoriasis, unspecified: Secondary | ICD-10-CM

## 2020-05-13 NOTE — Telephone Encounter (Signed)
Last Visit:03/11/2020 Next Visit:06/18/2020 Labs: 03/06/2020 GFR is low but is stable. CBC stable. TB Gold: 01/29/2020 Neg   Current Dose per office note on 03/11/2020: enbrel 50 mg subcutaneous injections once weekly.  Dx: Psoriatic arthropathy   Okay to refill Enbrel?

## 2020-05-13 NOTE — Telephone Encounter (Signed)
Routing refill request

## 2020-05-14 MED FILL — ENBREL MINI 50 MG/ML SOCT: 50 | 28 days supply | Qty: 4 | Fill #0

## 2020-05-16 ENCOUNTER — Telehealth: Payer: Self-pay | Admitting: Rheumatology

## 2020-05-16 DIAGNOSIS — Z79899 Other long term (current) drug therapy: Secondary | ICD-10-CM

## 2020-05-16 NOTE — Telephone Encounter (Signed)
Lab orders released for [facility].  

## 2020-05-16 NOTE — Telephone Encounter (Signed)
Patient requesting lab orders to be released to [facility]. Patient planning on going tomorrow for draw.

## 2020-05-17 DIAGNOSIS — Z79899 Other long term (current) drug therapy: Secondary | ICD-10-CM | POA: Diagnosis not present

## 2020-05-17 LAB — CBC WITH DIFFERENTIAL/PLATELET
Absolute Monocytes: 535 cells/uL (ref 200–950)
Basophils Absolute: 30 cells/uL (ref 0–200)
Basophils Relative: 0.6 %
Eosinophils Absolute: 110 cells/uL (ref 15–500)
Eosinophils Relative: 2.2 %
HCT: 38.3 % (ref 35.0–45.0)
Hemoglobin: 12.7 g/dL (ref 11.7–15.5)
Lymphs Abs: 2430 cells/uL (ref 850–3900)
MCH: 26.2 pg — ABNORMAL LOW (ref 27.0–33.0)
MCHC: 33.2 g/dL (ref 32.0–36.0)
MCV: 79.1 fL — ABNORMAL LOW (ref 80.0–100.0)
MPV: 11.2 fL (ref 7.5–12.5)
Monocytes Relative: 10.7 %
Neutro Abs: 1895 cells/uL (ref 1500–7800)
Neutrophils Relative %: 37.9 %
Platelets: 166 10*3/uL (ref 140–400)
RBC: 4.84 10*6/uL (ref 3.80–5.10)
RDW: 14.5 % (ref 11.0–15.0)
Total Lymphocyte: 48.6 %
WBC: 5 10*3/uL (ref 3.8–10.8)

## 2020-05-17 LAB — COMPLETE METABOLIC PANEL WITH GFR
AG Ratio: 1.6 (calc) (ref 1.0–2.5)
ALT: 17 U/L (ref 6–29)
AST: 19 U/L (ref 10–35)
Albumin: 4 g/dL (ref 3.6–5.1)
Alkaline phosphatase (APISO): 77 U/L (ref 37–153)
BUN/Creatinine Ratio: 13 (calc) (ref 6–22)
BUN: 16 mg/dL (ref 7–25)
CO2: 25 mmol/L (ref 20–32)
Calcium: 8.9 mg/dL (ref 8.6–10.4)
Chloride: 107 mmol/L (ref 98–110)
Creat: 1.2 mg/dL — ABNORMAL HIGH (ref 0.50–0.99)
GFR, Est African American: 54 mL/min/{1.73_m2} — ABNORMAL LOW (ref >=60)
GFR, Est Non African American: 47 mL/min/{1.73_m2} — ABNORMAL LOW (ref >=60)
Globulin: 2.5 g/dL (calc) (ref 1.9–3.7)
Glucose, Bld: 93 mg/dL (ref 65–139)
Potassium: 4.5 mmol/L (ref 3.5–5.3)
Sodium: 139 mmol/L (ref 135–146)
Total Bilirubin: 0.4 mg/dL (ref 0.2–1.2)
Total Protein: 6.5 g/dL (ref 6.1–8.1)

## 2020-05-20 NOTE — Telephone Encounter (Signed)
Please advise patient to see her PCP for elevated creatinine.  If she has not seen a nephrologist we should refer her to a nephrologist.

## 2020-06-05 NOTE — Progress Notes (Signed)
Office Visit Note  Patient: Sarah Horton             Date of Birth: 1950/11/28           MRN: [id number]             PCP: Michele Mcalpine, MD Referring: Michele Mcalpine, MD Visit Date: 06/18/2020 Occupation: @GUAROCC @  Subjective:  Arthritis (Doing good)   History of Present Illness: Sarah Horton is a 69 y.o. female with history of psoriatic arthritis, psoriasis, and osteoarthritis. She states she is doing much better on combination of Enbrel and Arava. She complains of discomfort in her right [facility] joint for which she has been using a brace. She also complains of some discomfort in her left middle finger which has been triggering intermittently. She does not have any psoriasis rash. She continues to have some lower back pain in the SI joint. She also complains of discomfort in her feet. She states she went for physical therapy in October and November which was very helpful. She has been exercising some on a regular basis.  Activities of Daily Living:  Patient reports morning stiffness for 30 minutes.   Patient Denies nocturnal pain.  Difficulty dressing/grooming: Reports Difficulty climbing stairs: Denies Difficulty getting out of chair: Denies Difficulty using hands for taps, buttons, cutlery, and/or writing: Reports  Review of Systems  Constitutional: Positive for fatigue. Negative for night sweats, weight gain and weight loss.  HENT: Positive for mouth dryness. Negative for mouth sores, trouble swallowing, trouble swallowing and nose dryness.   Eyes: Positive for dryness. Negative for pain, redness and visual disturbance.  Respiratory: Negative for cough, shortness of breath and difficulty breathing.   Cardiovascular: Negative for chest pain, palpitations, hypertension, irregular heartbeat and swelling in legs/feet.  Gastrointestinal: Negative for blood in stool, constipation and diarrhea.  Endocrine: Negative for excessive thirst and increased urination.   Genitourinary: Negative for difficulty urinating and vaginal dryness.  Musculoskeletal: Positive for arthralgias, joint pain and morning stiffness. Negative for joint swelling, myalgias, muscle weakness, muscle tenderness and myalgias.  Skin: Negative for color change, rash, hair loss, skin tightness, ulcers and sensitivity to sunlight.  Allergic/Immunologic: Positive for susceptible to infections.  Neurological: Positive for numbness. Negative for dizziness, memory loss, night sweats and weakness.  Hematological: Positive for bruising/bleeding tendency. Negative for swollen glands.  Psychiatric/Behavioral: Negative for depressed mood and sleep disturbance. The patient is not nervous/anxious.     PMFS History:  Patient Active Problem List   Diagnosis Date Noted  . History of peripheral neuropathy 11/20/2016  . Left wrist tendonitis/ history of surgery for Dequervains  11/20/2016  . Raynaud's disease without gangrene 09/22/2016  . High risk medication use 09/22/2016  . History of ulcer of lower limb/ Venous stasis ulcer 01/2015  09/22/2016  . Osteopenia of multiple sites 09/22/2016  . Psoriasis 09/03/2016  . Chest wall pain 07/16/2016  . Flank pain, acute 05/15/2016  . Subacromial bursitis of right shoulder joint 04/10/2016  . Low back pain 04/09/2016  . Pre-ulcerative calluses 02/11/2016  . Varicose veins of right lower extremity with complications 08/12/2015  . Elevated BP 08/09/2015  . Varicose veins of left lower extremity with complications 07/29/2015  . Varicose veins of bilateral lower extremities with other complications 07/09/2015  . Varicose veins of lower extremities with ulcer (HCC) 04/05/2015  . Status post bilateral knee replacements 12/13/2013  . Venous stasis ulcer of ankle (HCC) 06/06/2013  . ADD (attention deficit disorder) 04/04/2012  . INSOMNIA  06/20/2010  . Hypothyroidism 01/17/2010  . Obesity 01/17/2010  . Osteoarthritis 01/17/2010  . ANEMIA 10/02/2009  .  SINUSITIS, ACUTE 10/02/2009  . Asthma 10/02/2009  . Venous (peripheral) insufficiency 03/11/2008  . Allergic rhinitis 03/11/2008  . BRONCHITIS, RECURRENT 03/11/2008  . History of migraine 03/11/2008  . Anxiety state 11/24/2007  . Hereditary and idiopathic peripheral neuropathy 11/24/2007  . GERD 11/24/2007  . IRRITABLE BOWEL SYNDROME 11/24/2007  . PSORIATIC ARTHROPATHY 11/24/2007    Past Medical History:  Diagnosis Date  . Allergic rhinitis, cause unspecified   . Allergy   . Anemia, unspecified   . Anxiety state, unspecified   . Arthritis   . Cataract    bilaterally removed 2003,2007  . Esophageal reflux   . Irritable bowel syndrome   . Migraine   . Obesity, unspecified   . Psoriatic arthropathy (HCC)   . Unspecified chronic bronchitis (HCC)   . Unspecified hereditary and idiopathic peripheral neuropathy   . Unspecified hypothyroidism   . Unspecified venous (peripheral) insufficiency     Family History  Problem Relation Age of Onset  . Cirrhosis Father   . COPD Mother   . Heart disease Mother   . Hypertension Mother   . Varicose Veins Mother   . Breast cancer Maternal Grandmother   . Breast cancer Other        1st cousin   . Pancreatic cancer Other        1st cousin   . Thyroid disease Sister   . Colon cancer Neg Hx   . Colon polyps Neg Hx   . Esophageal cancer Neg Hx   . Rectal cancer Neg Hx   . Stomach cancer Neg Hx    Past Surgical History:  Procedure Laterality Date  . ABDOMINAL HYSTERECTOMY    . ABDOMINAL HYSTERECTOMY    . ABLATION ON ENDOMETRIOSIS     x2  . Bilat TKRs  04/2010   by [name]  . BUNIONECTOMY  2009  . CATARACT EXTRACTION, BILATERAL     7989,2119  . COLONOSCOPY  08-26-2004   with gessner tics and hems   . dequervains tensenovitis Left   . JOINT REPLACEMENT Bilateral    knees  . NASAL SINUS SURGERY    . SHOULDER SURGERY  2005  . TOTAL SHOULDER ARTHROPLASTY     Social History   Social History Narrative  . Not on file    Immunization History  Administered Date(s) Administered  . H1N1 05/18/2008  . Influenza Inj Mdck Quad Pf 04/01/2017  . Influenza Inj Mdck Quad With Preservative 04/13/2018  . Influenza Split 03/23/2011, 03/14/2012, 03/23/2013, 03/22/2016  . Influenza Whole 03/08/2008, 03/08/2009, 03/07/2010  . Influenza,inj,Quad PF,6+ Mos 03/21/2014, 03/31/2019  . PFIZER SARS-COV-2 Vaccination 08/22/2019, 09/12/2019  . Td 01/06/2002  . Tdap 03/14/2012     Objective: Vital Signs: BP (!) 161/95 (BP Location: Left Arm, Patient Position: Sitting, Cuff Size: Normal)   Pulse (!) 106   Resp 16   Ht 5\' 8"  (1.727 m)   Wt 240 lb (108.9 kg)   BMI 36.49 kg/m    Physical Exam Vitals and nursing note reviewed.  Constitutional:      Appearance: She is well-developed and well-nourished.  HENT:     Head: Normocephalic and atraumatic.  Eyes:     Extraocular Movements: EOM normal.     Conjunctiva/sclera: Conjunctivae normal.  Cardiovascular:     Rate and Rhythm: Normal rate and regular rhythm.     Pulses: Intact distal pulses.  Heart sounds: Normal heart sounds.  Pulmonary:     Effort: Pulmonary effort is normal.     Breath sounds: Normal breath sounds.  Abdominal:     General: Bowel sounds are normal.     Palpations: Abdomen is soft.  Musculoskeletal:     Cervical back: Normal range of motion.  Lymphadenopathy:     Cervical: No cervical adenopathy.  Skin:    General: Skin is warm and dry.     Capillary Refill: Capillary refill takes less than 2 seconds.  Neurological:     Mental Status: She is alert and oriented to person, place, and time.  Psychiatric:        Mood and Affect: Mood and affect normal.        Behavior: Behavior normal.      Musculoskeletal Exam: C-spine was in good range of motion. She had no tenderness over SI joints. Shoulder joints, elbow joints, wrist joints, MCPs PIPs were in good range of motion with no synovitis. She had bilateral PIP and DIP thickening. She had  right CMC tenderness. She has some flexor tendon thickening of her left middle finger. Hip joints, knee joints, ankles with good range of motion. She had no tenderness over MTPs.  CDAI Exam: CDAI Score: -- Patient Global: --; Provider Global: -- Swollen: --; Tender: -- Joint Exam 06/18/2020   No joint exam has been documented for this visit   There is currently no information documented on the homunculus. Go to the Rheumatology activity and complete the homunculus joint exam.  Investigation: No additional findings.  Imaging: No results found.  Recent Labs: Lab Results  Component Value Date   WBC 5.0 05/17/2020   HGB 12.7 05/17/2020   PLT 166 05/17/2020   NA 139 05/17/2020   K 4.5 05/17/2020   CL 107 05/17/2020   CO2 25 05/17/2020   GLUCOSE 93 05/17/2020   BUN 16 05/17/2020   CREATININE 1.20 (H) 05/17/2020   BILITOT 0.4 05/17/2020   ALKPHOS 87 01/25/2017   AST 19 05/17/2020   ALT 17 05/17/2020   PROT 6.5 05/17/2020   ALBUMIN 4.1 01/25/2017   CALCIUM 8.9 05/17/2020   GFRAA 54 (L) 05/17/2020   QFTBGOLDPLUS NEGATIVE 01/29/2020    Speciality Comments: Prior therapy: Methotrexate (elevated creatinine)  Procedures:  No procedures performed Allergies: Nucynta [tapentadol], Codeine, and Penicillins   Assessment / Plan:     Visit Diagnoses: Psoriatic arthropathy (HCC)-she had no active synovitis. She complains of some discomfort in her hands. I believe the discomfort is mostly coming from underlying osteoarthritis. She has been using a brace for right CMC joint. She also complains of left middle trigger finger. I have advised her to use some Aspercreme. If her symptoms persist we can do cortisone injection in the future.  Psoriasis-she had no active psoriasis lesions today.  High risk medication use - Arava 10 mg 1 tablet by mouth daily and enbrel 50 mg subcutaneous injections once weekly. d/c otezla, Cosentyx (started on November 16, 2017-December 2020). Labs have been stable  except for low GFR. She has seen nephrologist for low GFR. She has been advised to come in for labs in March and then every 3 months to monitor for drug toxicity. TB gold was negative on January 29, 2020.  Left wrist tendonitis/ history of surgery for Dequervains-improved.  Sacroiliitis (HCC)-he complains of some chronic discomfort.  Status post bilateral knee replacements-doing well.  Raynaud's disease without gangrene-currently not active.  Osteopenia of multiple sites - DEXA is not  in epic.    Pain in thoracic spine-she has noticed some improvement after physical therapy.  History of ulcer of lower limb/ Venous stasis ulcer 01/2015  - Resolved without recurrence.  Muscular deconditioning -she went to physical therapy in October and November 2021 which was helpful.  Other medical problems are listed as follows:  Primary insomnia  History of migraine  History of hypertension  History of peripheral neuropathy  History of asthma  History of hypothyroidism  History of gastroesophageal reflux (GERD)  History of IBS  Educated but COVID-19 virus infection-patient has not received a booster yet. The benefits of getting booster were discussed. Use of mask, social distancing and hand hygiene was discussed. Instructions were placed in the AVS.  Orders: No orders of the defined types were placed in this encounter.  No orders of the defined types were placed in this encounter.    Follow-Up Instructions: Return in about 5 months (around 11/16/2020) for Psoriatic arthritis, Osteoarthritis.   Pollyann SavoyShaili Chamya Hunton, MD  Note - This record has been created using Animal nutritionistDragon software.  Chart creation errors have been sought, but may not always  have been located. Such creation errors do not reflect on  the standard of medical care.

## 2020-06-07 MED FILL — ENBREL MINI 50 MG/ML SOCT: 50 | 28 days supply | Qty: 4 | Fill #1

## 2020-06-18 ENCOUNTER — Other Ambulatory Visit: Payer: Self-pay

## 2020-06-18 ENCOUNTER — Encounter: Payer: Self-pay | Admitting: Rheumatology

## 2020-06-18 ENCOUNTER — Ambulatory Visit (INDEPENDENT_AMBULATORY_CARE_PROVIDER_SITE_OTHER): Payer: BC Managed Care – PPO | Admitting: Rheumatology

## 2020-06-18 VITALS — BP 161/95 | HR 106 | Resp 16 | Ht 68.0 in | Wt 240.0 lb

## 2020-06-18 DIAGNOSIS — Z79899 Other long term (current) drug therapy: Secondary | ICD-10-CM

## 2020-06-18 DIAGNOSIS — M778 Other enthesopathies, not elsewhere classified: Secondary | ICD-10-CM

## 2020-06-18 DIAGNOSIS — L4050 Arthropathic psoriasis, unspecified: Secondary | ICD-10-CM

## 2020-06-18 DIAGNOSIS — F5101 Primary insomnia: Secondary | ICD-10-CM

## 2020-06-18 DIAGNOSIS — L409 Psoriasis, unspecified: Secondary | ICD-10-CM | POA: Diagnosis not present

## 2020-06-18 DIAGNOSIS — R29898 Other symptoms and signs involving the musculoskeletal system: Secondary | ICD-10-CM

## 2020-06-18 DIAGNOSIS — Z8709 Personal history of other diseases of the respiratory system: Secondary | ICD-10-CM

## 2020-06-18 DIAGNOSIS — Z7189 Other specified counseling: Secondary | ICD-10-CM

## 2020-06-18 DIAGNOSIS — Z96653 Presence of artificial knee joint, bilateral: Secondary | ICD-10-CM

## 2020-06-18 DIAGNOSIS — M546 Pain in thoracic spine: Secondary | ICD-10-CM

## 2020-06-18 DIAGNOSIS — M461 Sacroiliitis, not elsewhere classified: Secondary | ICD-10-CM

## 2020-06-18 DIAGNOSIS — Z8639 Personal history of other endocrine, nutritional and metabolic disease: Secondary | ICD-10-CM

## 2020-06-18 DIAGNOSIS — Z872 Personal history of diseases of the skin and subcutaneous tissue: Secondary | ICD-10-CM

## 2020-06-18 DIAGNOSIS — I7300 Raynaud's syndrome without gangrene: Secondary | ICD-10-CM

## 2020-06-18 DIAGNOSIS — M8589 Other specified disorders of bone density and structure, multiple sites: Secondary | ICD-10-CM

## 2020-06-18 DIAGNOSIS — Z8719 Personal history of other diseases of the digestive system: Secondary | ICD-10-CM

## 2020-06-18 DIAGNOSIS — Z8669 Personal history of other diseases of the nervous system and sense organs: Secondary | ICD-10-CM

## 2020-06-18 DIAGNOSIS — Z8679 Personal history of other diseases of the circulatory system: Secondary | ICD-10-CM

## 2020-06-18 NOTE — Patient Instructions (Addendum)
Standing Labs We placed an order today for your standing lab work.   Please have your standing labs drawn in March and every 3 months  If possible, please have your labs drawn 2 weeks prior to your appointment so that the provider can discuss your results at your appointment.  We have open lab daily Monday through Thursday from 8:30-12:30 PM and 1:30-4:30 PM and Friday from 8:30-12:30 PM and 1:30-4:00 PM at the office of Dr. Amberlyn Martinezgarcia, [facility].   Please be advised, patients with office appointments requiring lab work will take precedents over walk-in lab work.  If possible, please come for your lab work on Monday and Friday afternoons, as you may experience shorter wait times. The office is located at [address], [address], [place], [place] [address] No appointment is necessary.   Labs are drawn by [facility]. Please bring your co-pay at the time of your lab draw.  You may receive a bill from [facility] for your lab work.  If you wish to have your labs drawn at another location, please call the office 24 hours in advance to send orders.  If you have any questions regarding directions or hours of operation,  please call [phone].   As a reminder, please drink plenty of water prior to coming for your lab work. Thanks!   COVID-19 vaccine recommendations:   COVID-19 vaccine is recommended for everyone (unless you are allergic to a vaccine component), even if you are on a medication that suppresses your immune system.    Do not take Tylenol or any anti-inflammatory medications (NSAIDs) 24 hours prior to the COVID-19 vaccination.   There is no direct evidence about the efficacy of the COVID-19 vaccine in individuals who are on medications that suppress the immune system.   Even if you are fully vaccinated, and you are on any medications that suppress your immune system, please continue to wear a mask, maintain at least six feet social distance and practice hand  hygiene.   If you develop a COVID-19 infection, please contact your PCP or our office to determine if you need monoclonal antibody infusion.  The booster vaccine is now available for immunocompromised patients.   Please see the following web sites for updated information.   [email or web address]    

## 2020-07-09 MED FILL — ENBREL MINI 50 MG/ML SOCT: 50 | 28 days supply | Qty: 4 | Fill #2

## 2020-07-17 DIAGNOSIS — H0100B Unspecified blepharitis left eye, upper and lower eyelids: Secondary | ICD-10-CM | POA: Diagnosis not present

## 2020-07-17 DIAGNOSIS — H04123 Dry eye syndrome of bilateral lacrimal glands: Secondary | ICD-10-CM | POA: Diagnosis not present

## 2020-07-17 DIAGNOSIS — H0100A Unspecified blepharitis right eye, upper and lower eyelids: Secondary | ICD-10-CM | POA: Diagnosis not present

## 2020-07-17 DIAGNOSIS — D3131 Benign neoplasm of right choroid: Secondary | ICD-10-CM | POA: Diagnosis not present

## 2020-07-20 ENCOUNTER — Other Ambulatory Visit: Payer: Self-pay | Admitting: Physician Assistant

## 2020-07-20 DIAGNOSIS — L4050 Arthropathic psoriasis, unspecified: Secondary | ICD-10-CM

## 2020-07-22 NOTE — Telephone Encounter (Signed)
Last Visit: 06/18/2020 Next Visit: 11/12/2020 Labs: 05/17/2020 elevated creatinine.   Current Dose per office note 06/18/2020: Arava 10 mg 1 tablet by mouth daily  DX:  Psoriatic arthropathy   Last Fill: 04/16/2020  Okay to refill Arava?

## 2020-08-02 ENCOUNTER — Other Ambulatory Visit: Payer: Self-pay | Admitting: Physician Assistant

## 2020-08-02 ENCOUNTER — Telehealth: Payer: Self-pay

## 2020-08-02 ENCOUNTER — Other Ambulatory Visit: Payer: Self-pay | Admitting: Rheumatology

## 2020-08-02 DIAGNOSIS — L4050 Arthropathic psoriasis, unspecified: Secondary | ICD-10-CM

## 2020-08-02 NOTE — Telephone Encounter (Signed)
Last Visit: 06/18/2020 Next Visit: 11/12/2020 Labs: 05/17/2020 elevated creatinine.  Tb Gold: 01/29/2020 Neg   Current Dose per office note on 06/18/2020: enbrel 50 mg subcutaneous injections once weekly Dx: Psoriatic arthropathy   Last Fill: 05/13/2020   Patient advised she is due to update labs in March. Patient has calender marked.   Okay to refill Enbrel?

## 2020-08-02 NOTE — Telephone Encounter (Signed)
Patient's husband Demetrius called stating he was returning your call.

## 2020-08-02 NOTE — Telephone Encounter (Signed)
Spoke with patient and advised her she is due to update labs in March.

## 2020-08-06 MED FILL — ENBREL MINI 50 MG/ML SOCT: 50 | 28 days supply | Qty: 4 | Fill #0

## 2020-08-14 DIAGNOSIS — F422 Mixed obsessional thoughts and acts: Secondary | ICD-10-CM | POA: Diagnosis not present

## 2020-08-14 DIAGNOSIS — F411 Generalized anxiety disorder: Secondary | ICD-10-CM | POA: Diagnosis not present

## 2020-08-14 DIAGNOSIS — F331 Major depressive disorder, recurrent, moderate: Secondary | ICD-10-CM | POA: Diagnosis not present

## 2020-08-14 DIAGNOSIS — F902 Attention-deficit hyperactivity disorder, combined type: Secondary | ICD-10-CM | POA: Diagnosis not present

## 2020-08-19 DIAGNOSIS — E669 Obesity, unspecified: Secondary | ICD-10-CM | POA: Diagnosis not present

## 2020-08-19 DIAGNOSIS — L4050 Arthropathic psoriasis, unspecified: Secondary | ICD-10-CM | POA: Diagnosis not present

## 2020-08-19 DIAGNOSIS — I10 Essential (primary) hypertension: Secondary | ICD-10-CM | POA: Diagnosis not present

## 2020-08-19 DIAGNOSIS — N1831 Chronic kidney disease, stage 3a: Secondary | ICD-10-CM | POA: Diagnosis not present

## 2020-08-28 ENCOUNTER — Telehealth: Payer: Self-pay | Admitting: *Deleted

## 2020-08-28 NOTE — Telephone Encounter (Signed)
Labs received from: [facility] on: 08/19/2020 Reviewed by: Hazel Sams, PA-C  Labs drawn: Renal Function Panel, Magnesium, Urinalysis w/Microscopy, PR/CR Ratio  Results: Creatinine  1.13 eGFR   53 Magnesium 2.5 Ketones Trace

## 2020-08-29 ENCOUNTER — Telehealth: Payer: Self-pay

## 2020-08-29 NOTE — Telephone Encounter (Signed)
Patient called requesting her labwork orders be sent to [facility] in [facility].  Patient states she will be going on Monday, 09/02/20.

## 2020-09-01 ENCOUNTER — Other Ambulatory Visit: Payer: Self-pay | Admitting: *Deleted

## 2020-09-01 DIAGNOSIS — Z79899 Other long term (current) drug therapy: Secondary | ICD-10-CM

## 2020-09-01 NOTE — Telephone Encounter (Signed)
Released.

## 2020-09-03 MED FILL — ENBREL MINI 50 MG/ML SOCT: 50 | 28 days supply | Qty: 4 | Fill #1

## 2020-09-04 ENCOUNTER — Other Ambulatory Visit: Payer: Self-pay | Admitting: Physician Assistant

## 2020-09-04 DIAGNOSIS — Z79899 Other long term (current) drug therapy: Secondary | ICD-10-CM | POA: Diagnosis not present

## 2020-09-04 LAB — COMPLETE METABOLIC PANEL WITH GFR
AG Ratio: 1.8 (calc) (ref 1.0–2.5)
ALT: 20 U/L (ref 6–29)
AST: 24 U/L (ref 10–35)
Albumin: 4 g/dL (ref 3.6–5.1)
Alkaline phosphatase (APISO): 80 U/L (ref 37–153)
BUN/Creatinine Ratio: 16 (calc) (ref 6–22)
BUN: 19 mg/dL (ref 7–25)
CO2: 24 mmol/L (ref 20–32)
Calcium: 8.4 mg/dL — ABNORMAL LOW (ref 8.6–10.4)
Chloride: 105 mmol/L (ref 98–110)
Creat: 1.16 mg/dL — ABNORMAL HIGH (ref 0.50–0.99)
GFR, Est African American: 56 mL/min/{1.73_m2} — ABNORMAL LOW (ref >=60)
GFR, Est Non African American: 49 mL/min/{1.73_m2} — ABNORMAL LOW (ref >=60)
Globulin: 2.2 g/dL (calc) (ref 1.9–3.7)
Glucose, Bld: 99 mg/dL (ref 65–139)
Potassium: 4.4 mmol/L (ref 3.5–5.3)
Sodium: 137 mmol/L (ref 135–146)
Total Bilirubin: 0.5 mg/dL (ref 0.2–1.2)
Total Protein: 6.2 g/dL (ref 6.1–8.1)

## 2020-09-04 LAB — CBC WITH DIFFERENTIAL/PLATELET
Absolute Monocytes: 624 cells/uL (ref 200–950)
Basophils Absolute: 38 cells/uL (ref 0–200)
Basophils Relative: 0.8 %
Eosinophils Absolute: 163 cells/uL (ref 15–500)
Eosinophils Relative: 3.4 %
HCT: 37.4 % (ref 35.0–45.0)
Hemoglobin: 12.4 g/dL (ref 11.7–15.5)
Lymphs Abs: 2482 cells/uL (ref 850–3900)
MCH: 27.2 pg (ref 27.0–33.0)
MCHC: 33.2 g/dL (ref 32.0–36.0)
MCV: 82 fL (ref 80.0–100.0)
MPV: 11.6 fL (ref 7.5–12.5)
Monocytes Relative: 13 %
Neutro Abs: 1493 cells/uL — ABNORMAL LOW (ref 1500–7800)
Neutrophils Relative %: 31.1 %
Platelets: 139 10*3/uL — ABNORMAL LOW (ref 140–400)
RBC: 4.56 10*6/uL (ref 3.80–5.10)
RDW: 14.4 % (ref 11.0–15.0)
Total Lymphocyte: 51.7 %
WBC: 4.8 10*3/uL (ref 3.8–10.8)

## 2020-09-04 NOTE — Telephone Encounter (Signed)
Next Visit: 12/17/2020  Last Visit: 06/18/2020  Last Fill: 04/29/2020  Dx: Psoriatic arthropathy   Current Dose per office note on 06/18/2020, not mentioned  Okay to refill Robaxin?

## 2020-09-04 NOTE — Telephone Encounter (Signed)
Please clarify how the patient is taking methocarbamol.  I am getting a warning for the risk of prescribing robaxin TID.  Please advise the patient to only take robaxin only as needed and try to limit to only twice daily if necessary.

## 2020-09-04 NOTE — Telephone Encounter (Signed)
Patient is taking Robaxin every 6 hours as needed, patient advised to take twice a day as needed, patient verbalized understanding. RX changed

## 2020-09-05 ENCOUNTER — Other Ambulatory Visit (HOSPITAL_COMMUNITY): Payer: Self-pay

## 2020-09-05 ENCOUNTER — Other Ambulatory Visit: Payer: Self-pay | Admitting: *Deleted

## 2020-09-05 DIAGNOSIS — L4050 Arthropathic psoriasis, unspecified: Secondary | ICD-10-CM

## 2020-09-05 MED ORDER — LEFLUNOMIDE 10 MG PO TABS: ORAL_TABLET | ORAL | 2 refills | Status: DC

## 2020-09-12 ENCOUNTER — Other Ambulatory Visit: Payer: BC Managed Care – PPO | Admitting: Internal Medicine

## 2020-09-12 ENCOUNTER — Other Ambulatory Visit: Payer: Self-pay

## 2020-09-12 DIAGNOSIS — E039 Hypothyroidism, unspecified: Secondary | ICD-10-CM

## 2020-09-12 DIAGNOSIS — J45909 Unspecified asthma, uncomplicated: Secondary | ICD-10-CM | POA: Diagnosis not present

## 2020-09-12 DIAGNOSIS — Z0000 Encounter for general adult medical examination without abnormal findings: Secondary | ICD-10-CM

## 2020-09-12 DIAGNOSIS — M1990 Unspecified osteoarthritis, unspecified site: Secondary | ICD-10-CM

## 2020-09-12 DIAGNOSIS — M8589 Other specified disorders of bone density and structure, multiple sites: Secondary | ICD-10-CM | POA: Diagnosis not present

## 2020-09-12 DIAGNOSIS — K589 Irritable bowel syndrome without diarrhea: Secondary | ICD-10-CM

## 2020-09-12 NOTE — Addendum Note (Signed)
Addended by: Gregery Na on: 09/12/2020 10:41 AM   Modules accepted: Orders

## 2020-09-13 LAB — LIPID PANEL
Cholesterol: 149 mg/dL (ref <200)
HDL: 43 mg/dL — ABNORMAL LOW (ref >=50)
LDL Cholesterol (Calc): 70 mg/dL (calc)
Non-HDL Cholesterol (Calc): 106 mg/dL (calc) (ref <130)
Total CHOL/HDL Ratio: 3.5 (calc) (ref <5.0)
Triglycerides: 301 mg/dL — ABNORMAL HIGH (ref <150)

## 2020-09-13 LAB — TSH: TSH: 2.99 mIU/L (ref 0.40–4.50)

## 2020-09-13 LAB — VITAMIN D 25 HYDROXY (VIT D DEFICIENCY, FRACTURES): Vit D, 25-Hydroxy: 48 ng/mL (ref 30–100)

## 2020-09-27 ENCOUNTER — Other Ambulatory Visit (HOSPITAL_COMMUNITY): Payer: Self-pay

## 2020-09-30 ENCOUNTER — Other Ambulatory Visit (HOSPITAL_COMMUNITY): Payer: Self-pay

## 2020-09-30 MED FILL — Etanercept Subcutaneous Solution Cartridge 50 MG/ML: SUBCUTANEOUS | 28 days supply | Qty: 4 | Fill #0 | Status: AC

## 2020-10-01 ENCOUNTER — Ambulatory Visit (INDEPENDENT_AMBULATORY_CARE_PROVIDER_SITE_OTHER): Payer: BC Managed Care – PPO | Admitting: Internal Medicine

## 2020-10-01 ENCOUNTER — Other Ambulatory Visit (HOSPITAL_COMMUNITY): Payer: Self-pay

## 2020-10-01 ENCOUNTER — Encounter: Payer: Self-pay | Admitting: Internal Medicine

## 2020-10-01 ENCOUNTER — Other Ambulatory Visit: Payer: Self-pay

## 2020-10-01 VITALS — BP 110/78 | HR 114 | Ht 68.0 in | Wt 236.0 lb

## 2020-10-01 DIAGNOSIS — Z0000 Encounter for general adult medical examination without abnormal findings: Secondary | ICD-10-CM

## 2020-10-01 DIAGNOSIS — L4050 Arthropathic psoriasis, unspecified: Secondary | ICD-10-CM | POA: Diagnosis not present

## 2020-10-01 DIAGNOSIS — N1831 Chronic kidney disease, stage 3a: Secondary | ICD-10-CM

## 2020-10-01 DIAGNOSIS — Z96653 Presence of artificial knee joint, bilateral: Secondary | ICD-10-CM

## 2020-10-01 DIAGNOSIS — M549 Dorsalgia, unspecified: Secondary | ICD-10-CM

## 2020-10-01 DIAGNOSIS — E038 Other specified hypothyroidism: Secondary | ICD-10-CM

## 2020-10-01 DIAGNOSIS — Z8659 Personal history of other mental and behavioral disorders: Secondary | ICD-10-CM

## 2020-10-01 DIAGNOSIS — E063 Autoimmune thyroiditis: Secondary | ICD-10-CM

## 2020-10-01 DIAGNOSIS — I10 Essential (primary) hypertension: Secondary | ICD-10-CM

## 2020-10-01 NOTE — Progress Notes (Signed)
Subjective:    Patient ID: Sarah Horton, female    DOB: 11-30-1950, 70 y.o.   MRN: [id number]  HPI 70 year old Female for health maintenance exam and evaluation of medical issues.  She was formally seen by Dr. Alroy Dust who has retired.  Patient says she has not had a physical exam in 5 years.  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  Husband has had various multiple medical issues but works for [employer] here in [place].  They reside in [place].  Patient sees Dr. Allayne Stack for psoriatic arthritis.  Currently on Enbrel  History of chronic kidney disease stage IIIa followed by [facility]  History of bilateral knee replacement 2011  History of recurrent urinary tract infections.  Had partial hysterectomy 1999  History of endometriosis 1995  Stress fracture of left foot in the remote past  History of rotator cuff tear and torn biceps muscle on the right  Urethral dilatation and D&C 1979  Endometrial cauterization 1985  Hammertoe and bunion surgery 2019 99-19 94  Sinus surgery 2002  History of migraine headaches treated with Imitrex by Dr. Geralyn Flash in 2017  Takes tramadol sparingly for chronic pain.  Cataract extraction 2003 on the left in 2007 on the right  Bilateral knee replacements 2011  Rotator cuff and biceps surgery 2005  Acquired veins surgery left wrist 2013  Venous ablations 2017  Intolerant of codeine it causes nausea  Penicillin causes a rash and Nucynta causes a rash  No pregnancies and no miscarriages  Had tetanus immunization in 2018 and  Had pneumonia vaccine 2012  Colonoscopy 2017 by Dr. Leone Payor with 10-year follow-up recommended  History of chronic kidney disease stage IIIa with history of elevated creatinine 1.3 thought to be due to NSAID therapy.  This improved with discontinuation of NSAID therapy.  Currently fasting creatinine is 1.16.   Continue to monitor.  Review of Systems see above-has had some right-sided chest wall pain which I believe is musculoskeletal.     Objective:   Physical Exam BP 110/78  Pulse 114 regular pulse ox 97% Weight 236 pounds- BMI 35.88 Skin warm and dry.  No cervical adenopathy.  TMs clear.  Neck is supple without JVD thyromegaly or carotid bruits.  Chest clear to auscultation.  Cardiac exam: Regular rate and rhythm.  Abdomen soft nondistended.  No hepatosplenomegaly masses or significant tenderness.  Pap deferred due to age.  Bimanual normal.  No lower extremity pitting edema.  Neuro intact without gross focal deficits.  Affect thought and judgment are normal.      Assessment & Plan:  Psoriatic arthritis currently on Enbrel and Arava  Chronic kidney disease stage IIIa followed by [facility]  Hypothyroidism treated with thyroid replacement medication-Synthroid 88 mg daily  Chronic musculoskeletal pain treated sparingly tramadol and Robaxin  Allergic rhinitis treated with Flonase and Zyrtec  Hypertension treated with clonidine  History of ADD treated with Adderall XR 30 mg daily  Migraine headaches treated with sumatriptan nasal spray and as needed Phenergan for nausea  Patient says she is taken amoxicillin 500 mg 4 caps before dental procedure after knee replacement but I am not sure that it is necessary any longer  Plan: She has follow-up appointment here in 6 months for lipid panel and hemoglobin A1c with office visit.  She realizes she has a a complex medical history and that it will  take me a little bit of time to get  a handle on all of her medical issues.

## 2020-10-02 ENCOUNTER — Other Ambulatory Visit (HOSPITAL_COMMUNITY): Payer: Self-pay

## 2020-10-04 ENCOUNTER — Ambulatory Visit
Admission: RE | Admit: 2020-10-04 | Discharge: 2020-10-04 | Disposition: A | Discharge status: Home / Self Care | Payer: BC Managed Care – PPO | Source: Ambulatory Visit | Attending: Internal Medicine | Admitting: Internal Medicine

## 2020-10-04 ENCOUNTER — Other Ambulatory Visit: Payer: Self-pay

## 2020-10-04 DIAGNOSIS — M549 Dorsalgia, unspecified: Secondary | ICD-10-CM | POA: Diagnosis not present

## 2020-10-04 DIAGNOSIS — R079 Chest pain, unspecified: Secondary | ICD-10-CM | POA: Diagnosis not present

## 2020-10-12 ENCOUNTER — Other Ambulatory Visit: Payer: Self-pay | Admitting: Physician Assistant

## 2020-10-12 DIAGNOSIS — L4050 Arthropathic psoriasis, unspecified: Secondary | ICD-10-CM

## 2020-10-14 NOTE — Telephone Encounter (Signed)
Next Visit: 12/17/2020  Last Visit: 06/18/2020  Last Fill: 09/05/2020  DX: Psoriatic arthropathy   Current Dose per office note 06/18/2020, Arava 10 mg 1 tablet by mouth daily   Labs: 09/04/2020, Platelet count is low-139 and absolute neutrophils are borderline low. Her platelet count is been gradually trending down since restarting on Enbrel in combination with Arava. Please advise her to try taking arava 10 mg 1 tablet every other day.  Creatinine is elevated but stable. GFR is low-49. Please advise the patient to avoid NSAIDs.   Please advise the patient to return in 1 month to recheck CBC with diff.   Okay to refill Arava?

## 2020-10-28 ENCOUNTER — Other Ambulatory Visit: Payer: Self-pay | Admitting: Rheumatology

## 2020-10-28 ENCOUNTER — Other Ambulatory Visit (HOSPITAL_COMMUNITY): Payer: Self-pay

## 2020-10-28 DIAGNOSIS — L4050 Arthropathic psoriasis, unspecified: Secondary | ICD-10-CM

## 2020-10-28 MED ORDER — ENBREL MINI 50 MG/ML ~~LOC~~ SOCT
50.0000 mg | SUBCUTANEOUS | 0 refills | Status: DC
  Filled 2020-10-28: qty 4, 28d supply, fill #0
  Filled 2020-11-27: qty 4, 28d supply, fill #1
  Filled 2021-01-01: qty 4, 28d supply, fill #2

## 2020-10-28 NOTE — Telephone Encounter (Signed)
Next Visit: 12/17/2020  Last Visit: 06/18/2020  Last Fill: 08/02/2020  DX: Psoriatic arthropathy   Current Dose per office note 06/18/2020, enbrel 50 mg subcutaneous injections once weekly.  Labs: 09/04/2020, Platelet count is low-139 and absolute neutrophils are borderline low. Her platelet count is been gradually trending down since restarting on Enbrel in combination with Arava. Please advise her to try taking arava 10 mg 1 tablet every other day.  Creatinine is elevated but stable. GFR is low-49. Please advise the patient to avoid NSAIDs.   Please advise the patient to return in 1 month to recheck CBC with diff  TB Gold: 01/29/2020, negative  Okay to refill Enbrel Mini?

## 2020-10-29 ENCOUNTER — Other Ambulatory Visit (HOSPITAL_COMMUNITY): Payer: Self-pay

## 2020-10-30 ENCOUNTER — Other Ambulatory Visit (HOSPITAL_COMMUNITY): Payer: Self-pay

## 2020-11-03 NOTE — Patient Instructions (Addendum)
It was nice to see you today.  It will take me a little bit of time to get a good handle on your complex medical history.  Return in 6 months.  Continue close follow-up with nephrology and rheumatology.  Blood pressure is stable.

## 2020-11-12 ENCOUNTER — Ambulatory Visit: Payer: BC Managed Care – PPO | Admitting: Physician Assistant

## 2020-11-19 ENCOUNTER — Other Ambulatory Visit (HOSPITAL_COMMUNITY): Payer: Self-pay

## 2020-11-21 ENCOUNTER — Other Ambulatory Visit (HOSPITAL_COMMUNITY): Payer: Self-pay

## 2020-11-27 ENCOUNTER — Other Ambulatory Visit (HOSPITAL_COMMUNITY): Payer: Self-pay

## 2020-11-28 ENCOUNTER — Other Ambulatory Visit: Payer: Self-pay | Admitting: *Deleted

## 2020-11-28 ENCOUNTER — Telehealth: Payer: Self-pay | Admitting: Rheumatology

## 2020-11-28 ENCOUNTER — Telehealth: Payer: Self-pay

## 2020-11-28 DIAGNOSIS — Z79899 Other long term (current) drug therapy: Secondary | ICD-10-CM

## 2020-11-28 NOTE — Telephone Encounter (Signed)
Patient states she is due for labs on 12/05/2020. Patient states she is going to make an appointment for [facility] and then call back to have lab orders released so they will still be on file.

## 2020-11-28 NOTE — Telephone Encounter (Signed)
Patient called requesting labwork orders be sent to [facility] in [facility].  Patient plans to go tomorrow morning 11/29/20.

## 2020-11-28 NOTE — Telephone Encounter (Signed)
Patient calling to see if she is due for lab draw before next appointment on 12/17/2020. Please call to advise.

## 2020-11-28 NOTE — Telephone Encounter (Signed)
Lab Orders released.  

## 2020-11-29 DIAGNOSIS — Z79899 Other long term (current) drug therapy: Secondary | ICD-10-CM | POA: Diagnosis not present

## 2020-11-30 LAB — CBC WITH DIFFERENTIAL/PLATELET
Absolute Monocytes: 503 cells/uL (ref 200–950)
Basophils Absolute: 20 cells/uL (ref 0–200)
Basophils Relative: 0.5 %
Eosinophils Absolute: 90 cells/uL (ref 15–500)
Eosinophils Relative: 2.3 %
HCT: 39.3 % (ref 35.0–45.0)
Hemoglobin: 12.9 g/dL (ref 11.7–15.5)
Lymphs Abs: 1693 cells/uL (ref 850–3900)
MCH: 27.3 pg (ref 27.0–33.0)
MCHC: 32.8 g/dL (ref 32.0–36.0)
MCV: 83.1 fL (ref 80.0–100.0)
MPV: 11.3 fL (ref 7.5–12.5)
Monocytes Relative: 12.9 %
Neutro Abs: 1595 cells/uL (ref 1500–7800)
Neutrophils Relative %: 40.9 %
Platelets: 119 10*3/uL — ABNORMAL LOW (ref 140–400)
RBC: 4.73 10*6/uL (ref 3.80–5.10)
RDW: 14.1 % (ref 11.0–15.0)
Total Lymphocyte: 43.4 %
WBC: 3.9 10*3/uL (ref 3.8–10.8)

## 2020-11-30 LAB — COMPLETE METABOLIC PANEL WITH GFR
AG Ratio: 1.8 (calc) (ref 1.0–2.5)
ALT: 19 U/L (ref 6–29)
AST: 21 U/L (ref 10–35)
Albumin: 4.2 g/dL (ref 3.6–5.1)
Alkaline phosphatase (APISO): 77 U/L (ref 37–153)
BUN: 19 mg/dL (ref 7–25)
CO2: 25 mmol/L (ref 20–32)
Calcium: 8.9 mg/dL (ref 8.6–10.4)
Chloride: 106 mmol/L (ref 98–110)
Creat: 0.96 mg/dL (ref 0.50–0.99)
GFR, Est African American: 71 mL/min/{1.73_m2} (ref >=60)
GFR, Est Non African American: 61 mL/min/{1.73_m2} (ref >=60)
Globulin: 2.4 g/dL (calc) (ref 1.9–3.7)
Glucose, Bld: 93 mg/dL (ref 65–99)
Potassium: 4.3 mmol/L (ref 3.5–5.3)
Sodium: 139 mmol/L (ref 135–146)
Total Bilirubin: 0.5 mg/dL (ref 0.2–1.2)
Total Protein: 6.6 g/dL (ref 6.1–8.1)

## 2020-12-01 NOTE — Progress Notes (Signed)
PLTs are low. CMP is normal. Please repeat CBC in 1 month.

## 2020-12-03 NOTE — Progress Notes (Signed)
Office Visit Note  Patient: Sarah Horton             Date of Birth: 07-14-1950           MRN: [id number]             PCP: Margaree Mackintosh, MD Referring: Michele Mcalpine, MD Visit Date: 12/17/2020 Occupation: @GUAROCC @  Subjective:  Lower back discomfort   History of Present Illness: Sarah Horton is a 71 y.o. female with history of psoriatic arthritis.  Patient is taking Arava 10 mg 1 tablet by mouth every other daily and Enbrel 50 mg sq injections once weekly.  She has not missed any doses recently.  She denies any recent psoriatic arthritis flares.  She continues to have chronic SI joint pain and intermittent stiffness in her lower back.  She states that her thoracic pain has improved since going to physical therapy.  She continues to have occasional stiffness in both hands but denies any joint swelling.  She denies any active psoriasis at this time.     Activities of Daily Living:  Patient reports morning stiffness for 15 minutes.   Patient Denies nocturnal pain.  Difficulty dressing/grooming: Denies Difficulty climbing stairs: Denies Difficulty getting out of chair: Denies Difficulty using hands for taps, buttons, cutlery, and/or writing: Reports  Review of Systems  Constitutional:  Positive for fatigue.  HENT:  Positive for mouth dryness and nose dryness. Negative for mouth sores.   Eyes:  Negative for pain, itching, visual disturbance and dryness.  Respiratory:  Positive for cough and shortness of breath. Negative for hemoptysis and difficulty breathing.   Cardiovascular:  Negative for chest pain, palpitations and swelling in legs/feet.  Gastrointestinal:  Negative for abdominal pain, blood in stool, constipation and diarrhea.  Endocrine: Negative for increased urination.  Genitourinary:  Negative for painful urination.  Musculoskeletal:  Positive for joint pain, joint pain, myalgias, muscle weakness, morning stiffness, muscle tenderness and myalgias.  Negative for joint swelling.  Skin:  Positive for color change. Negative for rash and redness.  Allergic/Immunologic: Negative for susceptible to infections.  Neurological:  Positive for weakness. Negative for dizziness, numbness, headaches and memory loss.  Hematological:  Negative for swollen glands.  Psychiatric/Behavioral:  Negative for confusion and sleep disturbance.    PMFS History:  Patient Active Problem List   Diagnosis Date Noted   History of peripheral neuropathy 11/20/2016   Left wrist tendonitis/ history of surgery for Dequervains  11/20/2016   Raynaud's disease without gangrene 09/22/2016   High risk medication use 09/22/2016   History of ulcer of lower limb/ Venous stasis ulcer 01/2015  09/22/2016   Osteopenia of multiple sites 09/22/2016   Psoriasis 09/03/2016   Chest wall pain 07/16/2016   Flank pain, acute 05/15/2016   Subacromial bursitis of right shoulder joint 04/10/2016   Low back pain 04/09/2016   Pre-ulcerative calluses 02/11/2016   Varicose veins of right lower extremity with complications 08/12/2015   Elevated BP 08/09/2015   Varicose veins of left lower extremity with complications 07/29/2015   Varicose veins of bilateral lower extremities with other complications 07/09/2015   Varicose veins of lower extremities with ulcer (HCC) 04/05/2015   Status post bilateral knee replacements 12/13/2013   Venous stasis ulcer of ankle (HCC) 06/06/2013   ADD (attention deficit disorder) 04/04/2012   INSOMNIA 06/20/2010   Hypothyroidism 01/17/2010   Obesity 01/17/2010   Osteoarthritis 01/17/2010   ANEMIA 10/02/2009   SINUSITIS, ACUTE 10/02/2009   Asthma 10/02/2009  Venous (peripheral) insufficiency 03/11/2008   Allergic rhinitis 03/11/2008   BRONCHITIS, RECURRENT 03/11/2008   History of migraine 03/11/2008   Anxiety state 11/24/2007   Hereditary and idiopathic peripheral neuropathy 11/24/2007   GERD 11/24/2007   IRRITABLE BOWEL SYNDROME 11/24/2007   PSORIATIC  ARTHROPATHY 11/24/2007    Past Medical History:  Diagnosis Date   Allergic rhinitis, cause unspecified    Allergy    Anemia, unspecified    Anxiety state, unspecified    Arthritis    Cataract    bilaterally removed 2003,2007   Esophageal reflux    Irritable bowel syndrome    Migraine    Obesity, unspecified    Psoriatic arthropathy (HCC)    Unspecified chronic bronchitis (HCC)    Unspecified hereditary and idiopathic peripheral neuropathy    Unspecified hypothyroidism    Unspecified venous (peripheral) insufficiency     Family History  Problem Relation Age of Onset   Cirrhosis Father    COPD Mother    Heart disease Mother    Hypertension Mother    Varicose Veins Mother    Breast cancer Maternal Grandmother    Breast cancer Other        1st cousin    Pancreatic cancer Other        1st cousin    Thyroid disease Sister    Colon cancer Neg Hx    Colon polyps Neg Hx    Esophageal cancer Neg Hx    Rectal cancer Neg Hx    Stomach cancer Neg Hx    Past Surgical History:  Procedure Laterality Date   ABDOMINAL HYSTERECTOMY     ABDOMINAL HYSTERECTOMY     ABLATION ON ENDOMETRIOSIS     x2   Bilat TKRs  04/2010   by [name]   BUNIONECTOMY  2009   CATARACT EXTRACTION, BILATERAL     2003,2007   COLONOSCOPY  08-26-2004   with gessner tics and hems    dequervains tensenovitis Left    JOINT REPLACEMENT Bilateral    knees   NASAL SINUS SURGERY     SHOULDER SURGERY  2005   TOTAL SHOULDER ARTHROPLASTY     Social History   Social History Narrative   Not on file   Immunization History  Administered Date(s) Administered   H1N1 05/18/2008   Influenza Inj Mdck Quad Pf 04/01/2017   Influenza Inj Mdck Quad With Preservative 04/13/2018   Influenza Split 03/23/2011, 03/14/2012, 03/23/2013, 03/22/2016   Influenza Whole 03/08/2008, 03/08/2009, 03/07/2010   Influenza,inj,Quad PF,6+ Mos 03/21/2014, 03/31/2019   PFIZER(Purple Top)SARS-COV-2 Vaccination 08/22/2019, 09/12/2019    Pneumococcal Conjugate-13 06/28/2019   Td 01/06/2002   Tdap 03/14/2012     Objective: Vital Signs: BP 129/82 (BP Location: Left Arm, Patient Position: Sitting, Cuff Size: Large)   Pulse (!) 103   Ht 5' 8.5" (1.74 m)   Wt 235 lb (106.6 kg)   BMI 35.21 kg/m    Physical Exam Vitals and nursing note reviewed.  Constitutional:      Appearance: She is well-developed.  HENT:     Head: Normocephalic and atraumatic.  Eyes:     Conjunctiva/sclera: Conjunctivae normal.  Cardiovascular:     Heart sounds: Normal heart sounds.  Pulmonary:     Effort: Pulmonary effort is normal.  Abdominal:     Palpations: Abdomen is soft.  Musculoskeletal:     Cervical back: Normal range of motion.  Skin:    General: Skin is warm and dry.     Capillary Refill: Capillary refill takes  less than 2 seconds.  Neurological:     Mental Status: She is alert and oriented to person, place, and time.  Psychiatric:        Behavior: Behavior normal.     Musculoskeletal Exam: C-spine, thoracic spine, and lumbar spine good ROM.  Tenderness over bilateral SI joints.  Shoulder joints, elbow joints, wrist joints, MCPs, PIPs, and DIPs good ROM with no synovitis.  Complete fist formation bilaterally.  PIP and DIP thickening noted.  Right CMC tenderness noted.  Hip joints have good range of motion with some discomfort in the left hip.  Bilateral knee replacements have good range of motion with no discomfort.  No warmth or effusion of knee replacements noted.  Ankle joints have good range of motion with no joint tenderness or inflammation.  No tenderness or swelling of ankle joints.  CDAI Exam: CDAI Score: -- Patient Global: --; Provider Global: -- Swollen: --; Tender: -- Joint Exam 12/17/2020   No joint exam has been documented for this visit   There is currently no information documented on the homunculus. Go to the Rheumatology activity and complete the homunculus joint exam.  Investigation: No additional  findings.  Imaging: No results found.  Recent Labs: Lab Results  Component Value Date   WBC 3.9 11/29/2020   HGB 12.9 11/29/2020   PLT 119 (L) 11/29/2020   NA 139 11/29/2020   K 4.3 11/29/2020   CL 106 11/29/2020   CO2 25 11/29/2020   GLUCOSE 93 11/29/2020   BUN 19 11/29/2020   CREATININE 0.96 11/29/2020   BILITOT 0.5 11/29/2020   ALKPHOS 87 01/25/2017   AST 21 11/29/2020   ALT 19 11/29/2020   PROT 6.6 11/29/2020   ALBUMIN 4.1 01/25/2017   CALCIUM 8.9 11/29/2020   GFRAA 71 11/29/2020   QFTBGOLDPLUS NEGATIVE 01/29/2020    Speciality Comments: Prior therapy: Methotrexate (elevated creatinine)  Procedures:  No procedures performed Allergies: Nucynta [tapentadol], Codeine, and Penicillins   Assessment / Plan:     Visit Diagnoses: Psoriatic arthropathy (HCC): She has no synovitis or dactylitis on examination today.  She is clinically doing well on Enbrel 50 mg subcutaneous injections once weekly and Arava 10 mg 1 tablet by mouth every other day.  She has been tolerating both medications without any side effects and has not missed any doses recently.  She continues to have occasional SI joint discomfort bilaterally.  She has tenderness palpation over both SI joints, left greater than right on examination today.  No evidence of Achilles tendinitis or plantar fasciitis currently.  She has no active psoriasis at this time.  She requested to try to come off of Arava to see if her platelet count will improve.  She would like to remain on Enbrel as monotherapy.  She was advised to notify us if she develops increased joint pain or joint swelling and she will be started on Arava as prescribed.  She will follow-up in the office in 5 months or sooner if necessary.  Psoriasis: No active psoriasis at this time.  High risk medication use - Enbrel 50 mg subcutaneous injections once weekly.  A sample of Enbrel was provided to the patient today.  Patient would like to discontinue Arava to see if  she has any improvement in her thrombocytopenia.  D/c otezla, Cosentyx (started on November 16, 2017-December 2020).   CBC and CMP were drawn on 11/29/2020.  Her next lab work will be due in September and every 3 months to monitor for toxicity.  Standing orders for CBC and CMP remain in place.  TB Gold negative on 01/29/2020.  Future order for TB gold will be placed today.- Plan: QuantiFERON-TB Gold Plus Discussed the importance of holding Enbrel if she develops signs or symptoms of an infection and to resume once the infection has completely cleared. Advised the patient to schedule yearly skin exam.  Screening for tuberculosis -Future order for TB gold placed today.  Plan: QuantiFERON-TB Gold Plus  Left wrist tendonitis/ history of surgery for Dequervains  -Resolved   Sacroiliitis (HCC): She has tenderness and intermittent stiffness in both SI joints.  Status post bilateral knee replacements: Doing well.  She has good range of motion with no discomfort.  Raynaud's disease without gangrene: Not currently active.  No signs of sclerodactyly noted.  Osteopenia of multiple sites - DEXA is not in epic.  She takes vitamin D 1000 units daily.  Pain in thoracic spine: Her discomfort has improved since going to rehab.  Muscular deconditioning - She previously went to physical therapy which improved her overall strength.  Other medical conditions are listed as follows:  History of ulcer of lower limb/ Venous stasis ulcer 01/2015  - Resolved without recurrence.  History of migraine  Primary insomnia  History of peripheral neuropathy  History of hypertension  History of hypothyroidism  History of gastroesophageal reflux (GERD)  History of IBS  History of asthma   Orders: Orders Placed This Encounter  Procedures   QuantiFERON-TB Gold Plus   No orders of the defined types were placed in this encounter.   Follow-Up Instructions: Return in about 5 months (around 05/19/2021) for Psoriatic  arthritis.   Gearldine Bienenstockaylor M Timber Lucarelli, PA-C  Note - This record has been created using Dragon software.  Chart creation errors have been sought, but may not always  have been located. Such creation errors do not reflect on  the standard of medical care.

## 2020-12-04 DIAGNOSIS — F331 Major depressive disorder, recurrent, moderate: Secondary | ICD-10-CM | POA: Diagnosis not present

## 2020-12-04 DIAGNOSIS — F902 Attention-deficit hyperactivity disorder, combined type: Secondary | ICD-10-CM | POA: Diagnosis not present

## 2020-12-04 DIAGNOSIS — F422 Mixed obsessional thoughts and acts: Secondary | ICD-10-CM | POA: Diagnosis not present

## 2020-12-04 DIAGNOSIS — F411 Generalized anxiety disorder: Secondary | ICD-10-CM | POA: Diagnosis not present

## 2020-12-16 DIAGNOSIS — L659 Nonscarring hair loss, unspecified: Secondary | ICD-10-CM | POA: Diagnosis not present

## 2020-12-16 DIAGNOSIS — E039 Hypothyroidism, unspecified: Secondary | ICD-10-CM | POA: Diagnosis not present

## 2020-12-16 DIAGNOSIS — R7301 Impaired fasting glucose: Secondary | ICD-10-CM | POA: Diagnosis not present

## 2020-12-16 DIAGNOSIS — E669 Obesity, unspecified: Secondary | ICD-10-CM | POA: Diagnosis not present

## 2020-12-17 ENCOUNTER — Telehealth: Payer: Self-pay | Admitting: *Deleted

## 2020-12-17 ENCOUNTER — Encounter: Payer: Self-pay | Admitting: Physician Assistant

## 2020-12-17 ENCOUNTER — Other Ambulatory Visit: Payer: Self-pay

## 2020-12-17 ENCOUNTER — Ambulatory Visit (INDEPENDENT_AMBULATORY_CARE_PROVIDER_SITE_OTHER): Payer: BC Managed Care – PPO | Admitting: Physician Assistant

## 2020-12-17 VITALS — BP 129/82 | HR 103 | Ht 68.5 in | Wt 235.0 lb

## 2020-12-17 DIAGNOSIS — Z79899 Other long term (current) drug therapy: Secondary | ICD-10-CM | POA: Diagnosis not present

## 2020-12-17 DIAGNOSIS — I7300 Raynaud's syndrome without gangrene: Secondary | ICD-10-CM

## 2020-12-17 DIAGNOSIS — Z8639 Personal history of other endocrine, nutritional and metabolic disease: Secondary | ICD-10-CM

## 2020-12-17 DIAGNOSIS — M778 Other enthesopathies, not elsewhere classified: Secondary | ICD-10-CM | POA: Diagnosis not present

## 2020-12-17 DIAGNOSIS — L409 Psoriasis, unspecified: Secondary | ICD-10-CM | POA: Diagnosis not present

## 2020-12-17 DIAGNOSIS — Z8669 Personal history of other diseases of the nervous system and sense organs: Secondary | ICD-10-CM

## 2020-12-17 DIAGNOSIS — L4050 Arthropathic psoriasis, unspecified: Secondary | ICD-10-CM | POA: Diagnosis not present

## 2020-12-17 DIAGNOSIS — Z96653 Presence of artificial knee joint, bilateral: Secondary | ICD-10-CM

## 2020-12-17 DIAGNOSIS — M8589 Other specified disorders of bone density and structure, multiple sites: Secondary | ICD-10-CM

## 2020-12-17 DIAGNOSIS — Z8709 Personal history of other diseases of the respiratory system: Secondary | ICD-10-CM

## 2020-12-17 DIAGNOSIS — M461 Sacroiliitis, not elsewhere classified: Secondary | ICD-10-CM

## 2020-12-17 DIAGNOSIS — F5101 Primary insomnia: Secondary | ICD-10-CM

## 2020-12-17 DIAGNOSIS — Z111 Encounter for screening for respiratory tuberculosis: Secondary | ICD-10-CM

## 2020-12-17 DIAGNOSIS — R29898 Other symptoms and signs involving the musculoskeletal system: Secondary | ICD-10-CM

## 2020-12-17 DIAGNOSIS — Z8719 Personal history of other diseases of the digestive system: Secondary | ICD-10-CM

## 2020-12-17 DIAGNOSIS — M546 Pain in thoracic spine: Secondary | ICD-10-CM

## 2020-12-17 DIAGNOSIS — Z8679 Personal history of other diseases of the circulatory system: Secondary | ICD-10-CM

## 2020-12-17 DIAGNOSIS — Z872 Personal history of diseases of the skin and subcutaneous tissue: Secondary | ICD-10-CM

## 2020-12-17 NOTE — Patient Instructions (Signed)
Standing Labs We placed an order today for your standing lab work.   Please have your standing labs drawn in August and every 3 months   If possible, please have your labs drawn 2 weeks prior to your appointment so that the provider can discuss your results at your appointment.  Please note that you may see your imaging and lab results in MyChart before we have reviewed them. We may be awaiting multiple results to interpret others before contacting you. Please allow our office up to 72 hours to thoroughly review all of the results before contacting the office for clarification of your results.  We have open lab daily: Monday through Thursday from 1:30-4:30 PM and Friday from 1:30-4:00 PM at the office of Dr. Shaili Deveshwar, [facility].   Please be advised, all patients with office appointments requiring lab work will take precedent over walk-in lab work.  If possible, please come for your lab work on Monday and Friday afternoons, as you may experience shorter wait times. The office is located at [address], [address], [place], [place] [address] No appointment is necessary.   Labs are drawn by [facility]. Please bring your co-pay at the time of your lab draw.  You may receive a bill from [facility] for your lab work.  If you wish to have your labs drawn at another location, please call the office 24 hours in advance to send orders.  If you have any questions regarding directions or hours of operation,  please call [phone].   As a reminder, please drink plenty of water prior to coming for your lab work. Thanks!  

## 2020-12-17 NOTE — Telephone Encounter (Signed)
Medication Samples have been provided to the patient.  Drug name: Enbrel Mini       Strength: 50 mg/mL        Qty: 1  LOT: [id number]  Exp.Date: 04/2023  Dosing instructions: Inject 50 mg into the skin once weekly.

## 2020-12-20 ENCOUNTER — Other Ambulatory Visit (HOSPITAL_COMMUNITY): Payer: Self-pay

## 2020-12-25 ENCOUNTER — Other Ambulatory Visit (HOSPITAL_COMMUNITY): Payer: Self-pay

## 2020-12-27 ENCOUNTER — Other Ambulatory Visit (HOSPITAL_COMMUNITY): Payer: Self-pay

## 2021-01-01 ENCOUNTER — Other Ambulatory Visit (HOSPITAL_COMMUNITY): Payer: Self-pay

## 2021-01-12 ENCOUNTER — Other Ambulatory Visit: Payer: Self-pay | Admitting: Physician Assistant

## 2021-01-12 DIAGNOSIS — L4050 Arthropathic psoriasis, unspecified: Secondary | ICD-10-CM

## 2021-01-13 NOTE — Telephone Encounter (Signed)
Next Visit: 05/21/2021  Last Visit: 12/17/2020  Last Fill:   DX: Psoriatic arthropathy  Current Dose per office note 12/17/2020: Patient would like to discontinue Arava to see if she has any improvement in her thrombocytopenia.

## 2021-01-20 ENCOUNTER — Other Ambulatory Visit (HOSPITAL_COMMUNITY): Payer: Self-pay

## 2021-01-23 ENCOUNTER — Other Ambulatory Visit (HOSPITAL_COMMUNITY): Payer: Self-pay

## 2021-01-27 ENCOUNTER — Other Ambulatory Visit: Payer: Self-pay | Admitting: Rheumatology

## 2021-01-27 ENCOUNTER — Other Ambulatory Visit (HOSPITAL_COMMUNITY): Payer: Self-pay

## 2021-01-27 DIAGNOSIS — L4050 Arthropathic psoriasis, unspecified: Secondary | ICD-10-CM

## 2021-01-27 NOTE — Telephone Encounter (Signed)
Next Visit: 05/21/2021   Last Visit: 12/17/2020   Last Fill: 10/28/2020   DX: Psoriatic arthropathy   Current Dose per office note 12/17/2020: Enbrel 50 mg subcutaneous injections once weekly.  Labs:  11/29/2020 PLTs are low. CMP is normal.  TB Gold: 01/29/2020 Neg   Future order in for TB Gold to be drawn with next labs due in September 2022.   Okay to refill Enbrel?

## 2021-01-28 ENCOUNTER — Other Ambulatory Visit (HOSPITAL_COMMUNITY): Payer: Self-pay

## 2021-01-28 MED ORDER — ENBREL MINI 50 MG/ML ~~LOC~~ SOCT
50.0000 mg | SUBCUTANEOUS | 0 refills | Status: DC
  Filled 2021-01-28: qty 4, 28d supply, fill #0
  Filled 2021-02-24: qty 4, 28d supply, fill #1
  Filled 2021-03-25: qty 4, 28d supply, fill #2

## 2021-02-03 ENCOUNTER — Telehealth: Payer: Self-pay

## 2021-02-03 NOTE — Telephone Encounter (Signed)
Patient called requesting labwork orders be sent to [facility] on [address] in [place].  Patient states she plans to go on Wednesday, 02/05/21.

## 2021-02-04 ENCOUNTER — Other Ambulatory Visit: Payer: Self-pay | Admitting: *Deleted

## 2021-02-04 DIAGNOSIS — Z111 Encounter for screening for respiratory tuberculosis: Secondary | ICD-10-CM

## 2021-02-04 DIAGNOSIS — Z79899 Other long term (current) drug therapy: Secondary | ICD-10-CM

## 2021-02-04 NOTE — Telephone Encounter (Signed)
Lab Orders released.  

## 2021-02-05 DIAGNOSIS — Z111 Encounter for screening for respiratory tuberculosis: Secondary | ICD-10-CM | POA: Diagnosis not present

## 2021-02-05 DIAGNOSIS — Z79899 Other long term (current) drug therapy: Secondary | ICD-10-CM | POA: Diagnosis not present

## 2021-02-09 LAB — CBC WITH DIFFERENTIAL/PLATELET
Absolute Monocytes: 628 cells/uL (ref 200–950)
Basophils Absolute: 30 cells/uL (ref 0–200)
Basophils Relative: 0.7 %
Eosinophils Absolute: 211 cells/uL (ref 15–500)
Eosinophils Relative: 4.9 %
HCT: 37.6 % (ref 35.0–45.0)
Hemoglobin: 12.4 g/dL (ref 11.7–15.5)
Lymphs Abs: 2159 cells/uL (ref 850–3900)
MCH: 27.4 pg (ref 27.0–33.0)
MCHC: 33 g/dL (ref 32.0–36.0)
MCV: 83 fL (ref 80.0–100.0)
MPV: 11.2 fL (ref 7.5–12.5)
Monocytes Relative: 14.6 %
Neutro Abs: 1273 cells/uL — ABNORMAL LOW (ref 1500–7800)
Neutrophils Relative %: 29.6 %
Platelets: 155 10*3/uL (ref 140–400)
RBC: 4.53 10*6/uL (ref 3.80–5.10)
RDW: 14.5 % (ref 11.0–15.0)
Total Lymphocyte: 50.2 %
WBC: 4.3 10*3/uL (ref 3.8–10.8)

## 2021-02-09 LAB — QUANTIFERON-TB GOLD PLUS
Mitogen-NIL: 1.59 IU/mL
NIL: 0.04 IU/mL
QuantiFERON-TB Gold Plus: NEGATIVE
TB1-NIL: 0 IU/mL
TB2-NIL: 0.01 IU/mL

## 2021-02-10 NOTE — Progress Notes (Signed)
TB gold negative

## 2021-02-19 DIAGNOSIS — F902 Attention-deficit hyperactivity disorder, combined type: Secondary | ICD-10-CM | POA: Diagnosis not present

## 2021-02-19 DIAGNOSIS — F331 Major depressive disorder, recurrent, moderate: Secondary | ICD-10-CM | POA: Diagnosis not present

## 2021-02-19 DIAGNOSIS — F422 Mixed obsessional thoughts and acts: Secondary | ICD-10-CM | POA: Diagnosis not present

## 2021-02-19 DIAGNOSIS — F411 Generalized anxiety disorder: Secondary | ICD-10-CM | POA: Diagnosis not present

## 2021-02-24 ENCOUNTER — Other Ambulatory Visit (HOSPITAL_COMMUNITY): Payer: Self-pay

## 2021-02-25 DIAGNOSIS — Z6836 Body mass index (BMI) 36.0-36.9, adult: Secondary | ICD-10-CM | POA: Diagnosis not present

## 2021-02-25 DIAGNOSIS — Z01419 Encounter for gynecological examination (general) (routine) without abnormal findings: Secondary | ICD-10-CM | POA: Diagnosis not present

## 2021-02-25 DIAGNOSIS — Z1231 Encounter for screening mammogram for malignant neoplasm of breast: Secondary | ICD-10-CM | POA: Diagnosis not present

## 2021-02-27 DIAGNOSIS — E039 Hypothyroidism, unspecified: Secondary | ICD-10-CM | POA: Diagnosis not present

## 2021-02-27 DIAGNOSIS — E781 Pure hyperglyceridemia: Secondary | ICD-10-CM | POA: Diagnosis not present

## 2021-03-11 DIAGNOSIS — L718 Other rosacea: Secondary | ICD-10-CM | POA: Diagnosis not present

## 2021-03-11 DIAGNOSIS — L821 Other seborrheic keratosis: Secondary | ICD-10-CM | POA: Diagnosis not present

## 2021-03-17 ENCOUNTER — Other Ambulatory Visit (HOSPITAL_COMMUNITY): Payer: Self-pay

## 2021-03-20 ENCOUNTER — Other Ambulatory Visit (HOSPITAL_COMMUNITY): Payer: Self-pay

## 2021-03-25 ENCOUNTER — Other Ambulatory Visit (HOSPITAL_COMMUNITY): Payer: Self-pay

## 2021-03-26 ENCOUNTER — Other Ambulatory Visit (HOSPITAL_COMMUNITY): Payer: Self-pay

## 2021-04-01 ENCOUNTER — Other Ambulatory Visit: Payer: BC Managed Care – PPO | Admitting: Internal Medicine

## 2021-04-01 ENCOUNTER — Other Ambulatory Visit: Payer: Self-pay

## 2021-04-01 DIAGNOSIS — R7301 Impaired fasting glucose: Secondary | ICD-10-CM

## 2021-04-01 DIAGNOSIS — Z6833 Body mass index (BMI) 33.0-33.9, adult: Secondary | ICD-10-CM | POA: Diagnosis not present

## 2021-04-01 DIAGNOSIS — E6609 Other obesity due to excess calories: Secondary | ICD-10-CM

## 2021-04-02 LAB — HEMOGLOBIN A1C
Hgb A1c MFr Bld: 5 % of total Hgb (ref <5.7)
Mean Plasma Glucose: 97 mg/dL
eAG (mmol/L): 5.4 mmol/L

## 2021-04-02 LAB — LIPID PANEL
Cholesterol: 165 mg/dL (ref <200)
HDL: 52 mg/dL (ref >=50)
LDL Cholesterol (Calc): 89 mg/dL (calc)
Non-HDL Cholesterol (Calc): 113 mg/dL (calc) (ref <130)
Total CHOL/HDL Ratio: 3.2 (calc) (ref <5.0)
Triglycerides: 144 mg/dL (ref <150)

## 2021-04-03 ENCOUNTER — Ambulatory Visit: Payer: BC Managed Care – PPO | Admitting: Internal Medicine

## 2021-04-03 ENCOUNTER — Encounter: Payer: Self-pay | Admitting: Internal Medicine

## 2021-04-03 ENCOUNTER — Other Ambulatory Visit: Payer: Self-pay

## 2021-04-03 VITALS — BP 130/82 | HR 99 | Temp 97.8°F | Wt 230.0 lb

## 2021-04-03 DIAGNOSIS — I10 Essential (primary) hypertension: Secondary | ICD-10-CM | POA: Diagnosis not present

## 2021-04-03 DIAGNOSIS — Z8659 Personal history of other mental and behavioral disorders: Secondary | ICD-10-CM

## 2021-04-03 DIAGNOSIS — Z23 Encounter for immunization: Secondary | ICD-10-CM | POA: Diagnosis not present

## 2021-04-03 DIAGNOSIS — N1831 Chronic kidney disease, stage 3a: Secondary | ICD-10-CM | POA: Diagnosis not present

## 2021-04-03 DIAGNOSIS — L4050 Arthropathic psoriasis, unspecified: Secondary | ICD-10-CM | POA: Diagnosis not present

## 2021-04-03 DIAGNOSIS — E038 Other specified hypothyroidism: Secondary | ICD-10-CM

## 2021-04-03 DIAGNOSIS — E063 Autoimmune thyroiditis: Secondary | ICD-10-CM

## 2021-04-03 NOTE — Progress Notes (Signed)
   Subjective:    Patient ID: Sarah Horton, female    DOB: 09/24/1950, 70 y.o.   MRN: [id number]  HPI 70 year old Female here today for 6 month recheck. When seen by Dr. Talmage Nap, triglycerides were elevated at 186 so she altered her diet and lipids are normal now. Now on Synthroid 100 mcg daily per Dr. Talmage Nap.  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 Does have a Land.  Remains on Enbrel for psoriatic arthritis.  Is followed by Dr. Corliss Skains.  Does not want Covid booster. Will take flu vaccine. Tdap up to date.  Hgb AIC excellent at 5.0%  Her TSH was not checked with this visit was checked in April 2022 and was normal on thyroid replacement medication  Colonoscopy 2017 with 10 year follow up.  Hx right rotator cuff repair and biceps surgery so has chronic right shoulder arthopathy. Does like exercising with [employer] but has to watch shoulder.  Review of Systems stopped eating ice cream and began to exercise.  GYN will order bone density. Mammogram by GYN was OK     Objective:   Physical Exam Blood pressure 130/82 pulse 99 temperature 97.8 degrees pulse oximetry 99% weight 230 pounds BMI 34.46  Skin: Warm and dry.  No thyromegaly or carotid bruits.  Chest is clear to auscultation.  Cardiac exam: Regular rate and rhythm without ectopy.  No lower extremity pitting edema.  Affect thought and judgment are normal.       Assessment & Plan:  Psoriatic arthritis currently on Enbrel per her rheumatologist  Hypothyroidism stable on thyroid replacement medication  History of migraine headaches treated with Imitrex as needed  History of allergic rhinitis treated with Zyrtec and Flonase  GE reflux treated with Pepcid  Essential hypertension treated with clonidine  History of ADD treated with Adderall XR 30 mg daily  History of chronic kidney disease stage IIIa followed by [facility]  Plan: She will return in 6 months  for health maintenance exam and evaluation of medical issues.  Recommend COVID booster.  Tetanus immunization is due next year.  Has had annual flu vaccine.

## 2021-04-03 NOTE — Patient Instructions (Addendum)
It was a pleasure to see you today. Flu vaccine given today. RTC in 6 months. Labs are stable.

## 2021-04-15 DIAGNOSIS — L821 Other seborrheic keratosis: Secondary | ICD-10-CM | POA: Diagnosis not present

## 2021-04-23 ENCOUNTER — Other Ambulatory Visit (HOSPITAL_COMMUNITY): Payer: Self-pay

## 2021-04-23 ENCOUNTER — Other Ambulatory Visit: Payer: Self-pay | Admitting: Physician Assistant

## 2021-04-23 DIAGNOSIS — L4050 Arthropathic psoriasis, unspecified: Secondary | ICD-10-CM

## 2021-04-23 MED ORDER — ENBREL MINI 50 MG/ML ~~LOC~~ SOCT
50.0000 mg | SUBCUTANEOUS | 0 refills | Status: DC
Start: 2021-04-23 — End: 2021-05-19
  Filled 2021-04-23: qty 4, 28d supply, fill #0

## 2021-04-23 NOTE — Telephone Encounter (Addendum)
Next Visit: 05/21/2021   Last Visit: 12/17/2020   Last Fill: 10/28/2020   DX: Psoriatic arthropathy   Current Dose per office note 12/17/2020: Enbrel 50 mg subcutaneous injections once weekly.   Labs:  02/05/2021 Platelet count has returned to WNL.  Absolute neutrophils remain slightly low.  WBC count is WNL.  Rest of CBC WNL. 11/29/2020 PLTs are low. CMP is normal.    TB Gold: 02/05/2021 Neg    Patient advised she is due to update labs. Patient will update next week.  Okay to refill Enbrel?

## 2021-04-28 ENCOUNTER — Telehealth: Payer: Self-pay | Admitting: Rheumatology

## 2021-04-28 ENCOUNTER — Other Ambulatory Visit: Payer: Self-pay | Admitting: *Deleted

## 2021-04-28 DIAGNOSIS — Z79899 Other long term (current) drug therapy: Secondary | ICD-10-CM

## 2021-04-28 NOTE — Telephone Encounter (Signed)
Lab Orders released.  

## 2021-04-28 NOTE — Telephone Encounter (Signed)
Patient going to [facility] in [place] for labs tomorrow. Please release orders.

## 2021-04-29 DIAGNOSIS — Z79899 Other long term (current) drug therapy: Secondary | ICD-10-CM | POA: Diagnosis not present

## 2021-04-30 LAB — COMPLETE METABOLIC PANEL WITH GFR
AG Ratio: 1.5 (calc) (ref 1.0–2.5)
ALT: 22 U/L (ref 6–29)
AST: 24 U/L (ref 10–35)
Albumin: 4.1 g/dL (ref 3.6–5.1)
Alkaline phosphatase (APISO): 73 U/L (ref 37–153)
BUN: 18 mg/dL (ref 7–25)
CO2: 25 mmol/L (ref 20–32)
Calcium: 9.2 mg/dL (ref 8.6–10.4)
Chloride: 107 mmol/L (ref 98–110)
Creat: 1.04 mg/dL (ref 0.50–1.05)
Globulin: 2.7 g/dL (calc) (ref 1.9–3.7)
Glucose, Bld: 92 mg/dL (ref 65–139)
Potassium: 4.7 mmol/L (ref 3.5–5.3)
Sodium: 140 mmol/L (ref 135–146)
Total Bilirubin: 0.5 mg/dL (ref 0.2–1.2)
Total Protein: 6.8 g/dL (ref 6.1–8.1)
eGFR: 59 mL/min/{1.73_m2} — ABNORMAL LOW (ref >=60)

## 2021-04-30 LAB — CBC WITH DIFFERENTIAL/PLATELET
Absolute Monocytes: 597 cells/uL (ref 200–950)
Basophils Absolute: 31 cells/uL (ref 0–200)
Basophils Relative: 0.8 %
Eosinophils Absolute: 98 cells/uL (ref 15–500)
Eosinophils Relative: 2.5 %
HCT: 38.6 % (ref 35.0–45.0)
Hemoglobin: 12.6 g/dL (ref 11.7–15.5)
Lymphs Abs: 2262 cells/uL (ref 850–3900)
MCH: 27.2 pg (ref 27.0–33.0)
MCHC: 32.6 g/dL (ref 32.0–36.0)
MCV: 83.4 fL (ref 80.0–100.0)
MPV: 10.9 fL (ref 7.5–12.5)
Monocytes Relative: 15.3 %
Neutro Abs: 913 cells/uL — ABNORMAL LOW (ref 1500–7800)
Neutrophils Relative %: 23.4 %
Platelets: 150 10*3/uL (ref 140–400)
RBC: 4.63 10*6/uL (ref 3.80–5.10)
RDW: 14.3 % (ref 11.0–15.0)
Total Lymphocyte: 58 %
WBC: 3.9 10*3/uL (ref 3.8–10.8)

## 2021-05-08 NOTE — Progress Notes (Signed)
Office Visit Note  Patient: Sarah Horton             Date of Birth: 04-29-1951           MRN: [id number]             PCP: Elby Showers, MD Referring: Elby Showers, MD Visit Date: 05/21/2021 Occupation: @GUAROCC @  Subjective:  Intermittent joint pain.   History of Present Illness: Sarah Horton is a 70 y.o. female with a history of psoriatic arthritis and osteoarthritis.  She states she has intermittent discomfort in her hands with some mild swelling.  She states that it is related to the activities.  She has been taking care of her cousin who was going through chemotherapy.  She is off-and-on discomfort in her shoulders.  She also lifted something heavy in the house and says that she has been having some lower back pain.  She  requested a prescription for methocarbamol.  Activities of Daily Living:  Patient reports morning stiffness for 30  minutes.   Patient Denies nocturnal pain.  Difficulty dressing/grooming: Denies Difficulty climbing stairs: Denies Difficulty getting out of chair: Denies Difficulty using hands for taps, buttons, cutlery, and/or writing: Denies  Review of Systems  Constitutional:  Positive for fatigue. Negative for night sweats, weight gain and weight loss.  HENT:  Positive for mouth dryness and nose dryness. Negative for mouth sores, trouble swallowing and trouble swallowing.   Eyes:  Positive for dryness. Negative for pain, redness, itching and visual disturbance.  Respiratory:  Negative for cough, shortness of breath and difficulty breathing.   Cardiovascular:  Negative for chest pain, palpitations, hypertension, irregular heartbeat and swelling in legs/feet.  Gastrointestinal:  Negative for blood in stool, constipation and diarrhea.  Endocrine: Negative for increased urination.  Genitourinary:  Negative for difficulty urinating and vaginal dryness.  Musculoskeletal:  Positive for morning stiffness. Negative for joint pain, joint pain,  joint swelling, myalgias, muscle weakness, muscle tenderness and myalgias.  Skin:  Negative for color change, rash, hair loss, redness, skin tightness, ulcers and sensitivity to sunlight.  Allergic/Immunologic: Positive for susceptible to infections.  Neurological:  Negative for dizziness, numbness, headaches, memory loss, night sweats and weakness.  Hematological:  Positive for bruising/bleeding tendency. Negative for swollen glands.  Psychiatric/Behavioral:  Negative for depressed mood, confusion and sleep disturbance. The patient is not nervous/anxious.    PMFS History:  Patient Active Problem List   Diagnosis Date Noted   History of peripheral neuropathy 11/20/2016   Left wrist tendonitis/ history of surgery for Dequervains  11/20/2016   Raynaud's disease without gangrene 09/22/2016   High risk medication use 09/22/2016   History of ulcer of lower limb/ Venous stasis ulcer 01/2015  09/22/2016   Osteopenia of multiple sites 09/22/2016   Psoriasis 09/03/2016   Chest wall pain 07/16/2016   Flank pain, acute 05/15/2016   Subacromial bursitis of right shoulder joint 04/10/2016   Low back pain 04/09/2016   Pre-ulcerative calluses 02/11/2016   Varicose veins of right lower extremity with complications 123456   Elevated BP 08/09/2015   Varicose veins of left lower extremity with complications 123456   Varicose veins of bilateral lower extremities with other complications [id number]   Varicose veins of lower extremities with ulcer ([place]) 04/05/2015   Status post bilateral knee replacements 12/13/2013   Venous stasis ulcer of ankle ([place]) 06/06/2013   ADD (attention deficit disorder) 04/04/2012   INSOMNIA 06/20/2010   Hypothyroidism 01/17/2010   Obesity 01/17/2010  Osteoarthritis 01/17/2010   ANEMIA 10/02/2009   SINUSITIS, ACUTE 10/02/2009   Asthma 10/02/2009   Venous (peripheral) insufficiency 03/11/2008   Allergic rhinitis 03/11/2008   BRONCHITIS, RECURRENT 03/11/2008    History of migraine 03/11/2008   Anxiety state 11/24/2007   Hereditary and idiopathic peripheral neuropathy 11/24/2007   GERD 11/24/2007   IRRITABLE BOWEL SYNDROME 11/24/2007   PSORIATIC ARTHROPATHY 11/24/2007    Past Medical History:  Diagnosis Date   Allergic rhinitis, cause unspecified    Allergy    Anemia, unspecified    Anxiety state, unspecified    Arthritis    Cataract    bilaterally removed 2003,2007   Esophageal reflux    Irritable bowel syndrome    Migraine    Obesity, unspecified    Psoriatic arthropathy ([place])    Unspecified chronic bronchitis ([place])    Unspecified hereditary and idiopathic peripheral neuropathy    Unspecified hypothyroidism    Unspecified venous (peripheral) insufficiency     Family History  Problem Relation Age of Onset   Cirrhosis Father    COPD Mother    Heart disease Mother    Hypertension Mother    Varicose Veins Mother    Breast cancer Maternal Grandmother    Breast cancer Other        1st cousin    Pancreatic cancer Other        1st cousin    Thyroid disease Sister    Colon cancer Neg Hx    Colon polyps Neg Hx    Esophageal cancer Neg Hx    Rectal cancer Neg Hx    Stomach cancer Neg Hx    Past Surgical History:  Procedure Laterality Date   ABDOMINAL HYSTERECTOMY     ABDOMINAL HYSTERECTOMY     ABLATION ON ENDOMETRIOSIS     x2   Bilat TKRs  04/2010   by [name]   BUNIONECTOMY  2009   CATARACT EXTRACTION, BILATERAL     2003,2007   COLONOSCOPY  08-26-2004   with gessner tics and hems    dequervains tensenovitis Left    JOINT REPLACEMENT Bilateral    knees   NASAL SINUS SURGERY     SHOULDER SURGERY  2005   TOTAL SHOULDER ARTHROPLASTY     Social History   Social History Narrative   Not on file   Immunization History  Administered Date(s) Administered   H1N1 05/18/2008   Influenza Inj Mdck Quad Pf 04/01/2017   Influenza Inj Mdck Quad With Preservative 04/13/2018   Influenza Split 03/23/2011, 03/14/2012, 03/23/2013,  03/22/2016   Influenza Whole 03/08/2008, 03/08/2009, 03/07/2010   Influenza,inj,Quad PF,6+ Mos 03/21/2014, 03/31/2019, 04/03/2021   PFIZER(Purple Top)SARS-COV-2 Vaccination 08/22/2019, 09/12/2019   Pneumococcal Conjugate-13 06/28/2019   Td 01/06/2002   Tdap 03/14/2012     Objective: Vital Signs: BP (!) 146/94 (BP Location: Left Arm, Patient Position: Sitting, Cuff Size: Large)   Pulse 98   Ht 5' 8.5" (1.74 m)   Wt 240 lb 3.2 oz (109 kg)   BMI 35.99 kg/m    Physical Exam Vitals and nursing note reviewed.  Constitutional:      Appearance: She is well-developed.  HENT:     Head: Normocephalic and atraumatic.  Eyes:     Conjunctiva/sclera: Conjunctivae normal.  Cardiovascular:     Rate and Rhythm: Normal rate and regular rhythm.     Heart sounds: Normal heart sounds.  Pulmonary:     Effort: Pulmonary effort is normal.     Breath sounds: Normal breath sounds.  Abdominal:     General: Bowel sounds are normal.     Palpations: Abdomen is soft.  Musculoskeletal:     Cervical back: Normal range of motion.  Lymphadenopathy:     Cervical: No cervical adenopathy.  Skin:    General: Skin is warm and dry.     Capillary Refill: Capillary refill takes less than 2 seconds.  Neurological:     Mental Status: She is alert and oriented to person, place, and time.  Psychiatric:        Behavior: Behavior normal.     Musculoskeletal Exam: C-spine was in good range of motion.  She had thoracic kyphosis.  Shoulder joints, elbow joints, wrist joints, MCPs PIPs and DIPs with good range of motion with no synovitis.  Hip joints, knee joints, ankles, MTPs and PIPs with good range of motion with no synovitis.  She had bilateral total knee replacement.  CDAI Exam: CDAI Score: -- Patient Global: --; Provider Global: -- Swollen: --; Tender: -- Joint Exam 05/21/2021   No joint exam has been documented for this visit   There is currently no information documented on the homunculus. Go to the  Rheumatology activity and complete the homunculus joint exam.  Investigation: No additional findings.  Imaging: No results found.  Recent Labs: Lab Results  Component Value Date   WBC 3.9 04/29/2021   HGB 12.6 04/29/2021   PLT 150 04/29/2021   NA 140 04/29/2021   K 4.7 04/29/2021   CL 107 04/29/2021   CO2 25 04/29/2021   GLUCOSE 92 04/29/2021   BUN 18 04/29/2021   CREATININE 1.04 04/29/2021   BILITOT 0.5 04/29/2021   ALKPHOS 87 01/25/2017   AST 24 04/29/2021   ALT 22 04/29/2021   PROT 6.8 04/29/2021   ALBUMIN 4.1 01/25/2017   CALCIUM 9.2 04/29/2021   GFRAA 71 11/29/2020   QFTBGOLDPLUS NEGATIVE 02/05/2021    Speciality Comments: Prior therapy: Methotrexate (elevated creatinine)  Procedures:  No procedures performed Allergies: Nucynta [tapentadol], Codeine, and Penicillins   Assessment / Plan:     Visit Diagnoses: Psoriatic arthropathy (HCC)-patient had no synovitis on my examination.  She gives history of intermittent swelling in her hands with certain activities.  She stopped leflunomide as she did not notice any improvement by adding leflunomide.  She is on Enbrel monotherapy which she has been tolerating well.  Psoriasis-she had no active psoriasis lesions.  She had dry skin on her hands.  Topical moisturizing agents were discussed.  High risk medication use - Enbrel 50 mg subcutaneous injections once weekly.  Labs obtained on April 30, 1999 2020 were within normal limits.  TB gold was negative on February 05, 2021.  She has been advised to get labs in February and every 3 months to monitor for drug toxicity.  Information regarding mineralizations was placed in the AVS.  She was also advised to hold Enbrel in case she develops an infection and resume after the infection resolves.  Annual appointment with the dermatologist to screen for skin cancer was also advised while she is taking Enbrel.  Left wrist tendonitis/ history of surgery for Dequervains  - Resolved    Sacroiliitis (HCC)-she is off-and-on discomfort in her SI joints.  She had no tenderness on my examination today.  She states that she has been having increased muscle pain in her lower back since she lifted some heavy object.  She requested a prescription for methocarbamol.  Side effects of methocarbamol were reviewed and a prescription was sent.  Status  post bilateral knee replacements-doing well.  Raynaud's disease without gangrene-warm clothing and keeping core temperature warm during the winter months was discussed.  Osteopenia of multiple sites - DEXA is not in epic.   Pain in thoracic spine-she has off-and-on discomfort in her thoracic region.  Muscular deconditioning -she has been doing Pilates which has been helpful.Marland Kitchen  History of ulcer of lower limb/ Venous stasis ulcer 01/2015  - Resolved without recurrence.  Other medical problems are listed as follows:  History of peripheral neuropathy  History of migraine  Primary insomnia  History of gastroesophageal reflux (GERD)  History of asthma  History of IBS  History of hypertension  History of hypothyroidism  Orders: No orders of the defined types were placed in this encounter.  Meds ordered this encounter  Medications   methocarbamol (ROBAXIN) 500 MG tablet    Sig: Take 1 tablet (500 mg total) by mouth 2 (two) times daily as needed.    Dispense:  60 tablet    Refill:  0      Follow-Up Instructions: Return in about 5 months (around 10/19/2021) for Psoriatic arthritis.   Pollyann Savoy, MD  Note - This record has been created using Animal nutritionist.  Chart creation errors have been sought, but may not always  have been located. Such creation errors do not reflect on  the standard of medical care.

## 2021-05-19 ENCOUNTER — Other Ambulatory Visit: Payer: Self-pay | Admitting: Physician Assistant

## 2021-05-19 ENCOUNTER — Other Ambulatory Visit (HOSPITAL_COMMUNITY): Payer: Self-pay

## 2021-05-19 DIAGNOSIS — L4050 Arthropathic psoriasis, unspecified: Secondary | ICD-10-CM

## 2021-05-20 ENCOUNTER — Other Ambulatory Visit (HOSPITAL_COMMUNITY): Payer: Self-pay

## 2021-05-20 MED ORDER — ENBREL MINI 50 MG/ML ~~LOC~~ SOCT
50.0000 mg | SUBCUTANEOUS | 0 refills | Status: DC
  Filled 2021-05-20: qty 4, 28d supply, fill #0
  Filled 2021-06-17: qty 4, 28d supply, fill #1
  Filled 2021-07-15: qty 4, 28d supply, fill #2

## 2021-05-20 NOTE — Telephone Encounter (Signed)
Next Visit: 05/21/2021   Last Visit: 12/17/2020   Last Fill: 10/28/2020   DX: Psoriatic arthropathy   Current Dose per office note 12/17/2020: Enbrel 50 mg subcutaneous injections once weekly.   Labs: 04/29/2021 GFR is borderline low-59.  >60 is WNL. Creatinine has returned to WNL. Absolute neutroophil count is slightly low but stable.  TB Gold: 02/05/2021 Neg   Okay to refill Enbrel?

## 2021-05-21 ENCOUNTER — Encounter: Payer: Self-pay | Admitting: Rheumatology

## 2021-05-21 ENCOUNTER — Other Ambulatory Visit: Payer: Self-pay

## 2021-05-21 ENCOUNTER — Ambulatory Visit (INDEPENDENT_AMBULATORY_CARE_PROVIDER_SITE_OTHER): Payer: BC Managed Care – PPO | Admitting: Rheumatology

## 2021-05-21 ENCOUNTER — Other Ambulatory Visit (HOSPITAL_COMMUNITY): Payer: Self-pay

## 2021-05-21 VITALS — BP 146/94 | HR 98 | Ht 68.5 in | Wt 240.2 lb

## 2021-05-21 DIAGNOSIS — F5101 Primary insomnia: Secondary | ICD-10-CM

## 2021-05-21 DIAGNOSIS — Z8639 Personal history of other endocrine, nutritional and metabolic disease: Secondary | ICD-10-CM

## 2021-05-21 DIAGNOSIS — Z79899 Other long term (current) drug therapy: Secondary | ICD-10-CM

## 2021-05-21 DIAGNOSIS — Z8679 Personal history of other diseases of the circulatory system: Secondary | ICD-10-CM

## 2021-05-21 DIAGNOSIS — M778 Other enthesopathies, not elsewhere classified: Secondary | ICD-10-CM | POA: Diagnosis not present

## 2021-05-21 DIAGNOSIS — Z96653 Presence of artificial knee joint, bilateral: Secondary | ICD-10-CM

## 2021-05-21 DIAGNOSIS — L409 Psoriasis, unspecified: Secondary | ICD-10-CM

## 2021-05-21 DIAGNOSIS — M8589 Other specified disorders of bone density and structure, multiple sites: Secondary | ICD-10-CM

## 2021-05-21 DIAGNOSIS — Z8709 Personal history of other diseases of the respiratory system: Secondary | ICD-10-CM

## 2021-05-21 DIAGNOSIS — L4050 Arthropathic psoriasis, unspecified: Secondary | ICD-10-CM | POA: Diagnosis not present

## 2021-05-21 DIAGNOSIS — M546 Pain in thoracic spine: Secondary | ICD-10-CM

## 2021-05-21 DIAGNOSIS — M461 Sacroiliitis, not elsewhere classified: Secondary | ICD-10-CM

## 2021-05-21 DIAGNOSIS — Z872 Personal history of diseases of the skin and subcutaneous tissue: Secondary | ICD-10-CM

## 2021-05-21 DIAGNOSIS — R29898 Other symptoms and signs involving the musculoskeletal system: Secondary | ICD-10-CM

## 2021-05-21 DIAGNOSIS — I7300 Raynaud's syndrome without gangrene: Secondary | ICD-10-CM

## 2021-05-21 DIAGNOSIS — Z8669 Personal history of other diseases of the nervous system and sense organs: Secondary | ICD-10-CM

## 2021-05-21 DIAGNOSIS — Z8719 Personal history of other diseases of the digestive system: Secondary | ICD-10-CM

## 2021-05-21 MED ORDER — METHOCARBAMOL 500 MG PO TABS: 500.0000 mg | ORAL_TABLET | Freq: Two times a day (BID) | ORAL | 0 refills | Status: DC | PRN

## 2021-05-21 NOTE — Patient Instructions (Signed)
Standing Labs We placed an order today for your standing lab work.   Please have your standing labs drawn in February and every 3 months  If possible, please have your labs drawn 2 weeks prior to your appointment so that the provider can discuss your results at your appointment.  Please note that you may see your imaging and lab results in MyChart before we have reviewed them. We may be awaiting multiple results to interpret others before contacting you. Please allow our office up to 72 hours to thoroughly review all of the results before contacting the office for clarification of your results.  We have open lab daily: Monday through Thursday from 1:30-4:30 PM and Friday from 1:30-4:00 PM at the office of Dr. Pollyann Savoy, [facility].   Please be advised, all patients with office appointments requiring lab work will take precedent over walk-in lab work.  If possible, please come for your lab work on Monday and Friday afternoons, as you may experience shorter wait times. The office is located at [address], [address], [place], [place] [address] No appointment is necessary.   Labs are drawn by [facility]. Please bring your co-pay at the time of your lab draw.  You may receive a bill from [facility] for your lab work.  If you wish to have your labs drawn at another location, please call the office 24 hours in advance to send orders.  If you have any questions regarding directions or hours of operation,  please call [phone].   As a reminder, please drink plenty of water prior to coming for your lab work. Thanks!   Vaccines You are taking a medication(s) that can suppress your immune system.  The following immunizations are recommended: Flu annually Covid-19  Td/Tdap (tetanus, diphtheria, pertussis) every 10 years Pneumonia (Prevnar 15 then Pneumovax 23 at least 1 year apart.  Alternatively, can take Prevnar 20 without needing additional dose) Shingrix: 2 doses from 4  weeks to 6 months apart  Please check with your PCP to make sure you are up to date.   If you have signs or symptoms of an infection or start antibiotics: First, call your PCP for workup of your infection. Hold your medication through the infection, until you complete your antibiotics, and until symptoms resolve if you take the following: Injectable medication (Actemra, Benlysta, Cimzia, Cosentyx, Enbrel, Humira, Kevzara, Orencia, Remicade, Simponi, Stelara, Taltz, Tremfya) Methotrexate Leflunomide (Arava) Mycophenolate (Cellcept) [place]borne [place], or Rinvoq   Please get annual skin examination by dermatologist to screen for skin cancer while you are on Enbrel.

## 2021-05-22 DIAGNOSIS — F422 Mixed obsessional thoughts and acts: Secondary | ICD-10-CM | POA: Diagnosis not present

## 2021-05-22 DIAGNOSIS — F902 Attention-deficit hyperactivity disorder, combined type: Secondary | ICD-10-CM | POA: Diagnosis not present

## 2021-05-22 DIAGNOSIS — F411 Generalized anxiety disorder: Secondary | ICD-10-CM | POA: Diagnosis not present

## 2021-05-22 DIAGNOSIS — F331 Major depressive disorder, recurrent, moderate: Secondary | ICD-10-CM | POA: Diagnosis not present

## 2021-06-09 ENCOUNTER — Other Ambulatory Visit (HOSPITAL_COMMUNITY): Payer: Self-pay

## 2021-06-13 ENCOUNTER — Other Ambulatory Visit (HOSPITAL_COMMUNITY): Payer: Self-pay

## 2021-06-17 ENCOUNTER — Other Ambulatory Visit (HOSPITAL_COMMUNITY): Payer: Self-pay

## 2021-06-18 ENCOUNTER — Other Ambulatory Visit (HOSPITAL_COMMUNITY): Payer: Self-pay

## 2021-06-20 ENCOUNTER — Other Ambulatory Visit (HOSPITAL_COMMUNITY): Payer: Self-pay

## 2021-07-11 ENCOUNTER — Other Ambulatory Visit (HOSPITAL_COMMUNITY): Payer: Self-pay

## 2021-07-14 ENCOUNTER — Other Ambulatory Visit (HOSPITAL_COMMUNITY): Payer: Self-pay

## 2021-07-15 ENCOUNTER — Other Ambulatory Visit (HOSPITAL_COMMUNITY): Payer: Self-pay

## 2021-07-16 ENCOUNTER — Other Ambulatory Visit (HOSPITAL_COMMUNITY): Payer: Self-pay

## 2021-07-17 ENCOUNTER — Other Ambulatory Visit (HOSPITAL_COMMUNITY): Payer: Self-pay

## 2021-07-30 ENCOUNTER — Telehealth: Payer: Self-pay | Admitting: Rheumatology

## 2021-07-30 ENCOUNTER — Other Ambulatory Visit: Payer: Self-pay | Admitting: *Deleted

## 2021-07-30 DIAGNOSIS — Z79899 Other long term (current) drug therapy: Secondary | ICD-10-CM

## 2021-07-30 NOTE — Telephone Encounter (Signed)
Lab Orders released.  

## 2021-07-30 NOTE — Telephone Encounter (Signed)
Patient called the office requesting lab orders be sent to [facility] in [place] on [facility]. Patient has an appointment there on Friday 2/24.

## 2021-08-01 ENCOUNTER — Encounter: Payer: Self-pay | Admitting: Internal Medicine

## 2021-08-01 DIAGNOSIS — Z79899 Other long term (current) drug therapy: Secondary | ICD-10-CM | POA: Diagnosis not present

## 2021-08-01 DIAGNOSIS — Z780 Asymptomatic menopausal state: Secondary | ICD-10-CM | POA: Diagnosis not present

## 2021-08-01 DIAGNOSIS — M8589 Other specified disorders of bone density and structure, multiple sites: Secondary | ICD-10-CM | POA: Diagnosis not present

## 2021-08-01 LAB — CBC WITH DIFFERENTIAL/PLATELET
Absolute Monocytes: 606 cells/uL (ref 200–950)
Basophils Absolute: 19 cells/uL (ref 0–200)
Basophils Relative: 0.4 %
Eosinophils Absolute: 80 cells/uL (ref 15–500)
Eosinophils Relative: 1.7 %
HCT: 38.6 % (ref 35.0–45.0)
Hemoglobin: 12.9 g/dL (ref 11.7–15.5)
Lymphs Abs: 2547 cells/uL (ref 850–3900)
MCH: 27 pg (ref 27.0–33.0)
MCHC: 33.4 g/dL (ref 32.0–36.0)
MCV: 80.9 fL (ref 80.0–100.0)
MPV: 10.7 fL (ref 7.5–12.5)
Monocytes Relative: 12.9 %
Neutro Abs: 1448 cells/uL — ABNORMAL LOW (ref 1500–7800)
Neutrophils Relative %: 30.8 %
Platelets: 149 10*3/uL (ref 140–400)
RBC: 4.77 10*6/uL (ref 3.80–5.10)
RDW: 14.1 % (ref 11.0–15.0)
Total Lymphocyte: 54.2 %
WBC: 4.7 10*3/uL (ref 3.8–10.8)

## 2021-08-01 LAB — COMPLETE METABOLIC PANEL WITH GFR
AG Ratio: 1.4 (calc) (ref 1.0–2.5)
ALT: 18 U/L (ref 6–29)
AST: 23 U/L (ref 10–35)
Albumin: 4 g/dL (ref 3.6–5.1)
Alkaline phosphatase (APISO): 90 U/L (ref 37–153)
BUN: 17 mg/dL (ref 7–25)
CO2: 25 mmol/L (ref 20–32)
Calcium: 9.1 mg/dL (ref 8.6–10.4)
Chloride: 107 mmol/L (ref 98–110)
Creat: 0.95 mg/dL (ref 0.50–1.05)
Globulin: 2.9 g/dL (calc) (ref 1.9–3.7)
Glucose, Bld: 90 mg/dL (ref 65–99)
Potassium: 4 mmol/L (ref 3.5–5.3)
Sodium: 141 mmol/L (ref 135–146)
Total Bilirubin: 0.5 mg/dL (ref 0.2–1.2)
Total Protein: 6.9 g/dL (ref 6.1–8.1)
eGFR: 65 mL/min/{1.73_m2} (ref >=60)

## 2021-08-03 NOTE — Progress Notes (Signed)
CBC and CMP are normal.

## 2021-08-05 ENCOUNTER — Telehealth: Payer: Self-pay | Admitting: *Deleted

## 2021-08-05 NOTE — Telephone Encounter (Signed)
Received DEXA results from [facility].  Date of Scan: 08/01/2021  Lowest T-score:-2.3  BMD:0.598  Lowest site measured:Right Femoral Neck  DX: Osteopenia  Significant changes in BMD and site measured (5% and above):n/a  Current Regimen: Vitamin D  Recommendation:Continue Calcium, Vitamin D and resistive exercises   Reviewed by:Sherron Ales, PA-C  Next Appointment:  10/20/2021   Attempted to contact the patient and left message for patient to call the office.

## 2021-08-06 DIAGNOSIS — N1831 Chronic kidney disease, stage 3a: Secondary | ICD-10-CM | POA: Diagnosis not present

## 2021-08-11 ENCOUNTER — Other Ambulatory Visit (HOSPITAL_COMMUNITY): Payer: Self-pay

## 2021-08-11 ENCOUNTER — Other Ambulatory Visit: Payer: Self-pay | Admitting: Physician Assistant

## 2021-08-11 DIAGNOSIS — L4050 Arthropathic psoriasis, unspecified: Secondary | ICD-10-CM

## 2021-08-11 MED ORDER — ENBREL MINI 50 MG/ML ~~LOC~~ SOCT
50.0000 mg | SUBCUTANEOUS | 0 refills | Status: DC
  Filled 2021-08-11: qty 4, 28d supply, fill #0
  Filled 2021-09-10: qty 4, 28d supply, fill #1
  Filled 2021-10-28: qty 4, 28d supply, fill #2

## 2021-08-11 NOTE — Telephone Encounter (Signed)
Next Visit: 10/20/2021 ? ?Last Visit: 05/21/2021 ? ?Last Fill: 05/20/2021 ? ?DX: Psoriatic arthropathy ? ?Current Dose per office note on 05/21/2021: Enbrel 50 mg subcutaneous injections once weekly. ? ?Labs: 08/01/2021 CBC and CMP are normal. ? ?TB Gold: 02/05/2021 negative   ? ?Okay to refill enbrel?  ?

## 2021-08-12 DIAGNOSIS — M8580 Other specified disorders of bone density and structure, unspecified site: Secondary | ICD-10-CM | POA: Diagnosis not present

## 2021-08-13 ENCOUNTER — Other Ambulatory Visit (HOSPITAL_COMMUNITY): Payer: Self-pay

## 2021-08-14 DIAGNOSIS — L4050 Arthropathic psoriasis, unspecified: Secondary | ICD-10-CM | POA: Diagnosis not present

## 2021-08-14 DIAGNOSIS — E669 Obesity, unspecified: Secondary | ICD-10-CM | POA: Diagnosis not present

## 2021-08-14 DIAGNOSIS — N1831 Chronic kidney disease, stage 3a: Secondary | ICD-10-CM | POA: Diagnosis not present

## 2021-08-14 DIAGNOSIS — I10 Essential (primary) hypertension: Secondary | ICD-10-CM | POA: Diagnosis not present

## 2021-08-18 DIAGNOSIS — F411 Generalized anxiety disorder: Secondary | ICD-10-CM | POA: Diagnosis not present

## 2021-08-18 DIAGNOSIS — F422 Mixed obsessional thoughts and acts: Secondary | ICD-10-CM | POA: Diagnosis not present

## 2021-08-18 DIAGNOSIS — F331 Major depressive disorder, recurrent, moderate: Secondary | ICD-10-CM | POA: Diagnosis not present

## 2021-08-18 DIAGNOSIS — F902 Attention-deficit hyperactivity disorder, combined type: Secondary | ICD-10-CM | POA: Diagnosis not present

## 2021-09-04 ENCOUNTER — Other Ambulatory Visit (HOSPITAL_COMMUNITY): Payer: Self-pay

## 2021-09-08 ENCOUNTER — Other Ambulatory Visit (HOSPITAL_COMMUNITY): Payer: Self-pay

## 2021-09-10 ENCOUNTER — Telehealth: Payer: Self-pay

## 2021-09-10 ENCOUNTER — Other Ambulatory Visit (HOSPITAL_COMMUNITY): Payer: Self-pay

## 2021-09-10 NOTE — Telephone Encounter (Signed)
Per [facility] pt has been using a debit card rather than a copay card to assist with cost of Enbrel, however the card is currently declining. I am unable to gather any additional information on this situation as there are no past notes documented in Epic detailing the initial acquisition. Possibly card has expired or needs to be renewed. ATC pt and LVM requesting call back to discuss. Direct number provided. ?

## 2021-09-10 NOTE — Telephone Encounter (Signed)
Spoke with our Enbrel rep who was able to look into pt's case and provide insight. Pt needs to contact the Enbrel Support line [phone] and needs to state: "I have an accumulator plan and my copay is NOT being applied to my deductible. How can you help me afford my medication?" She may be asked what the Max OOP amount is for her plan, but per rep this information is not required. If pt does not want to contact insurance prior to calling support line, she should still answer "yes" if asked whether or not she has already spoken with her insurance.  ? ?Currently awaiting pt's return phone call. ?

## 2021-09-12 ENCOUNTER — Other Ambulatory Visit (HOSPITAL_COMMUNITY): Payer: Self-pay

## 2021-09-15 ENCOUNTER — Other Ambulatory Visit (HOSPITAL_COMMUNITY): Payer: Self-pay

## 2021-09-17 ENCOUNTER — Other Ambulatory Visit (HOSPITAL_COMMUNITY): Payer: Self-pay

## 2021-09-17 NOTE — Telephone Encounter (Signed)
Pt contacted me stating that she was able to get everything resolved between her insurance and the Enbrel copay card, as she already went through a similar experience last year. She also confirmed that both phone numbers on her profile are good to use. Requested she reach out to [place] in the future if she runs into any issues. Nothing further needed at this time. ?

## 2021-09-30 ENCOUNTER — Other Ambulatory Visit: Payer: BC Managed Care – PPO

## 2021-09-30 ENCOUNTER — Ambulatory Visit: Payer: BC Managed Care – PPO | Admitting: Internal Medicine

## 2021-09-30 ENCOUNTER — Encounter: Payer: BC Managed Care – PPO | Admitting: Internal Medicine

## 2021-09-30 VITALS — Temp 98.6°F

## 2021-09-30 DIAGNOSIS — J0100 Acute maxillary sinusitis, unspecified: Secondary | ICD-10-CM

## 2021-09-30 DIAGNOSIS — Z136 Encounter for screening for cardiovascular disorders: Secondary | ICD-10-CM | POA: Diagnosis not present

## 2021-09-30 DIAGNOSIS — I10 Essential (primary) hypertension: Secondary | ICD-10-CM | POA: Diagnosis not present

## 2021-09-30 DIAGNOSIS — J029 Acute pharyngitis, unspecified: Secondary | ICD-10-CM | POA: Diagnosis not present

## 2021-09-30 DIAGNOSIS — R490 Dysphonia: Secondary | ICD-10-CM

## 2021-09-30 DIAGNOSIS — E063 Autoimmune thyroiditis: Secondary | ICD-10-CM | POA: Diagnosis not present

## 2021-09-30 DIAGNOSIS — E038 Other specified hypothyroidism: Secondary | ICD-10-CM

## 2021-09-30 DIAGNOSIS — N1831 Chronic kidney disease, stage 3a: Secondary | ICD-10-CM

## 2021-09-30 LAB — COMPLETE METABOLIC PANEL WITH GFR
AG Ratio: 1.3 (calc) (ref 1.0–2.5)
ALT: 14 U/L (ref 6–29)
AST: 17 U/L (ref 10–35)
Albumin: 4 g/dL (ref 3.6–5.1)
Alkaline phosphatase (APISO): 83 U/L (ref 37–153)
BUN/Creatinine Ratio: 15 (calc) (ref 6–22)
BUN: 17 mg/dL (ref 7–25)
CO2: 26 mmol/L (ref 20–32)
Calcium: 9 mg/dL (ref 8.6–10.4)
Chloride: 105 mmol/L (ref 98–110)
Creat: 1.11 mg/dL — ABNORMAL HIGH (ref 0.50–1.05)
Globulin: 3 g/dL (calc) (ref 1.9–3.7)
Glucose, Bld: 86 mg/dL (ref 65–99)
Potassium: 4.2 mmol/L (ref 3.5–5.3)
Sodium: 139 mmol/L (ref 135–146)
Total Bilirubin: 0.7 mg/dL (ref 0.2–1.2)
Total Protein: 7 g/dL (ref 6.1–8.1)
eGFR: 54 mL/min/{1.73_m2} — ABNORMAL LOW (ref >=60)

## 2021-09-30 LAB — CBC WITH DIFFERENTIAL/PLATELET
Absolute Monocytes: 567 cells/uL (ref 200–950)
Basophils Absolute: 21 cells/uL (ref 0–200)
Basophils Relative: 0.5 %
Eosinophils Absolute: 88 cells/uL (ref 15–500)
Eosinophils Relative: 2.1 %
HCT: 38.2 % (ref 35.0–45.0)
Hemoglobin: 12.7 g/dL (ref 11.7–15.5)
Lymphs Abs: 2024 cells/uL (ref 850–3900)
MCH: 27 pg (ref 27.0–33.0)
MCHC: 33.2 g/dL (ref 32.0–36.0)
MCV: 81.3 fL (ref 80.0–100.0)
MPV: 10.7 fL (ref 7.5–12.5)
Monocytes Relative: 13.5 %
Neutro Abs: 1499 cells/uL — ABNORMAL LOW (ref 1500–7800)
Neutrophils Relative %: 35.7 %
Platelets: 157 10*3/uL (ref 140–400)
RBC: 4.7 10*6/uL (ref 3.80–5.10)
RDW: 14.5 % (ref 11.0–15.0)
Total Lymphocyte: 48.2 %
WBC: 4.2 10*3/uL (ref 3.8–10.8)

## 2021-09-30 LAB — LIPID PANEL
Cholesterol: 175 mg/dL (ref <200)
HDL: 54 mg/dL (ref >=50)
LDL Cholesterol (Calc): 102 mg/dL (calc) — ABNORMAL HIGH
Non-HDL Cholesterol (Calc): 121 mg/dL (calc) (ref <130)
Total CHOL/HDL Ratio: 3.2 (calc) (ref <5.0)
Triglycerides: 101 mg/dL (ref <150)

## 2021-09-30 LAB — TSH: TSH: 1.73 mIU/L (ref 0.40–4.50)

## 2021-09-30 LAB — POC COVID19 BINAXNOW: SARS Coronavirus 2 Ag: NEGATIVE

## 2021-09-30 MED ORDER — AZITHROMYCIN 250 MG PO TABS: ORAL_TABLET | ORAL | 0 refills | Status: DC

## 2021-09-30 MED ORDER — FLUCONAZOLE 150 MG PO TABS: 150.0000 mg | ORAL_TABLET | Freq: Once | ORAL | 0 refills | Status: AC

## 2021-09-30 MED ORDER — CEFTRIAXONE SODIUM 500 MG IJ SOLR
500.0000 mg | Freq: Once | INTRAMUSCULAR | Status: AC
  Administered 2021-09-30: 500 mg via INTRAMUSCULAR

## 2021-09-30 NOTE — Progress Notes (Deleted)
Patient presented for fasting labs. Has had protracted respiratory infection for 2-3 weeks.  Office visit was advised. ? ? ?In looking back, may have had respiratory congestion since November 2022.  She has an appointment for health maintenance exam tomorrow.  She is here today for fasting labs. ?She had not taken a COVID test prior to coming.  Has had protracted respiratory symptoms for several weeks.  She is bringing up discolored sputum and should be some today which has discolored yellow sputum mixed with blood. ?Denies fever or shaking chills. ?Rapid COVID test here today is negative. ? ? ?Objective: She is not tachypneic.  She is afebrile.  Her chest is clear to auscultation.  Pharynx slightly injected.  TMs clear.  She sounds very hoarse and has a deep congested cough. ? ?I feel that she has an acute lower respiratory infection and maxillary sinusitis. ?  She will be given 1 g IM Rocephin here in the office and started on a Zithromax Z-PAK.  May take Diflucan if needed for Candida vaginitis symptoms.  Keep appointment tomorrow.  Labs drawn today. ?

## 2021-09-30 NOTE — Progress Notes (Deleted)
? ?  Subjective:  ? ? Patient ID: Sarah Horton, female    DOB: 03/30/1951, 71 y.o.   MRN: [id number] ? ?HPI 71 year old Female here today for fasting labs. ?However, she is ill with a protracted respiratory infection. ? ? ? ? ? ?Review of Systems ? ?   ?Objective:  ? Physical Exam ? ? ? ? ?   ?Assessment & Plan:  ? ? ?

## 2021-10-01 ENCOUNTER — Encounter: Payer: Self-pay | Admitting: Internal Medicine

## 2021-10-01 NOTE — Progress Notes (Addendum)
Patient presented for fasting labs. Has had protracted respiratory infection for 2-3 weeks.  Office visit was advised. ? ? ?In looking back, may have had respiratory congestion since November 2022.  She has an appointment for health maintenance exam tomorrow.  She is here today for fasting labs. ?She had not taken a COVID test prior to coming.  Has had protracted respiratory symptoms for several weeks.  She is bringing up discolored sputum and should be some today which has discolored yellow sputum mixed with blood. ?Denies fever or shaking chills. ?Rapid COVID test here today is negative. ? ? ?Objective: She is not tachypneic.  She is afebrile.  Her chest is clear to auscultation.  Pharynx slightly injected.  TMs clear.  She sounds very hoarse and has a deep congested cough. ? ?I feel that she has an acute lower respiratory infection and maxillary sinusitis. ?  She will be given 1 g IM Rocephin here in the office and started on a Zithromax Z-PAK.  May take Diflucan if needed for Candida vaginitis symptoms.  Keep appointment tomorrow.  Labs drawn today. ?

## 2021-10-01 NOTE — Addendum Note (Signed)
Addended by: Margaree Mackintosh on: 10/01/2021 04:12 PM ? ? Modules accepted: Level of Service ? ?

## 2021-10-02 ENCOUNTER — Encounter: Payer: Self-pay | Admitting: Internal Medicine

## 2021-10-02 ENCOUNTER — Ambulatory Visit (INDEPENDENT_AMBULATORY_CARE_PROVIDER_SITE_OTHER): Payer: BC Managed Care – PPO | Admitting: Internal Medicine

## 2021-10-02 VITALS — BP 120/82 | HR 87 | Temp 98.5°F | Ht 68.0 in | Wt 237.5 lb

## 2021-10-02 DIAGNOSIS — Z6836 Body mass index (BMI) 36.0-36.9, adult: Secondary | ICD-10-CM

## 2021-10-02 DIAGNOSIS — M8580 Other specified disorders of bone density and structure, unspecified site: Secondary | ICD-10-CM

## 2021-10-02 DIAGNOSIS — I10 Essential (primary) hypertension: Secondary | ICD-10-CM | POA: Diagnosis not present

## 2021-10-02 DIAGNOSIS — L4050 Arthropathic psoriasis, unspecified: Secondary | ICD-10-CM

## 2021-10-02 DIAGNOSIS — Z8659 Personal history of other mental and behavioral disorders: Secondary | ICD-10-CM

## 2021-10-02 DIAGNOSIS — E038 Other specified hypothyroidism: Secondary | ICD-10-CM | POA: Diagnosis not present

## 2021-10-02 DIAGNOSIS — Z96653 Presence of artificial knee joint, bilateral: Secondary | ICD-10-CM | POA: Diagnosis not present

## 2021-10-02 DIAGNOSIS — E063 Autoimmune thyroiditis: Secondary | ICD-10-CM

## 2021-10-02 DIAGNOSIS — Z8669 Personal history of other diseases of the nervous system and sense organs: Secondary | ICD-10-CM

## 2021-10-02 DIAGNOSIS — Z0000 Encounter for general adult medical examination without abnormal findings: Secondary | ICD-10-CM | POA: Diagnosis not present

## 2021-10-02 DIAGNOSIS — N1831 Chronic kidney disease, stage 3a: Secondary | ICD-10-CM

## 2021-10-02 DIAGNOSIS — J301 Allergic rhinitis due to pollen: Secondary | ICD-10-CM

## 2021-10-02 LAB — POCT URINALYSIS DIPSTICK
Bilirubin, UA: NEGATIVE
Blood, UA: NEGATIVE
Glucose, UA: NEGATIVE
Ketones, UA: NEGATIVE
Leukocytes, UA: NEGATIVE
Nitrite, UA: NEGATIVE
Protein, UA: NEGATIVE
Spec Grav, UA: 1.02 (ref 1.010–1.025)
Urobilinogen, UA: 0.2 E.U./dL
pH, UA: 5 (ref 5.0–8.0)

## 2021-10-02 MED ORDER — MONTELUKAST SODIUM 10 MG PO TABS: 10.0000 mg | ORAL_TABLET | Freq: Every day | ORAL | 3 refills | Status: DC

## 2021-10-02 MED ORDER — MONTELUKAST SODIUM 10 MG PO TABS: 10.0000 mg | ORAL_TABLET | Freq: Every day | ORAL | 1 refills | Status: DC

## 2021-10-02 NOTE — Patient Instructions (Addendum)
Tetanus and pneumococcal vaccine discussed. Colonoscopy is up to date. Mammogram and Dexa scan up to date. Take vitamin d supplement. Labs reviewed and are WNL. RTC in one year. ?

## 2021-10-02 NOTE — Progress Notes (Signed)
? ?Subjective:  ? ? Patient ID: Sarah Horton, female    DOB: [birth year], 71 y.o.   MRN: [id number] ? ?HPI 71 year old Female seen for health maintenance exam and evaluation of medical issues. ? ?Sees Dr. Estanislado Pandy for psoriatic and osteoarthritis. Has not taking Enbrel recently for about 2 weeks as she has had respiratory infection. ? ?Had mammogram at [facility] Feb 2023 ? ?Bone density -2.3 at [facility] in February ? ?Social history: She is married.  Husband is also a patient here.  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  Husband has multiple medical issues.  They reside in [place]. ? ?Patient has a history of psoriatic arthritis treated with Enbrel by Dr. Estanislado Pandy. ? ?History of chronic kidney disease stage IIIa followed by [facility] ? ?Colonoscopy 2017 by Dr. Carlean Purl with 10-year follow-up recommended. ? ?Tetanus immunization is up-to-date. ? ?Had bilateral knee replacement 2011. ? ?History of recurrent urinary infections. ? ?Had partial hysterectomy 1999. ? ?History of endometriosis 1995. ? ?Stress fracture of left foot in the remote past. ? ?History of rotator cuff tear and torn biceps muscle on the right. ? ?GE reflux treated with Pepcid ? ?Essential hypertension treated with clonidine ? ?ADD treated with Adderall XR 30 mg daily ? ?History of chronic kidney disease stage IIIa followed by [facility] ? ?Allergic rhinitis treated with Zyrtec and Flonase ? ?Migraine headaches treated with as needed Imitrex ? ?Hypothyroidism treated with thyroid replacement medication ? ?Urethral dilatation and D&C 1979 ? ?Endometrial cauterization 1985 ? ?Hammertoe and bunion surgery ? ?Sinus surgery 2002 ? ?History of migraine headaches treated with Imitrex by Dr. Wyn Quaker in 2017 ? ?Takes tramadol sparingly for musculoskeletal pain. ? ?Cataract extraction in 2003 on the left and in 2007 on the right. ? ?Venous ablations 2017 ? ?Rotator cuff and  biceps surgery 200 ? ? ? ?Review of Systems see above-no new complaints ? ?   ?Objective:  ? Physical Exam ?Blood pressure excellent 120/82 pulse 87 temperature 98.5 degrees pulse oximetry 98% weight 237 pounds 8 ounces BMI 36.11 ? ?Skin warm and dry.  Nodes: None.  TMs clear.  Pharynx clear.  Neck is supple.  No carotid bruits.  No thyromegaly.  Chest clear.  Cardiac exam: Regular rate and rhythm.  Abdomen soft nondistended without hepatosplenomegaly masses or tenderness.  Breasts are without masses.  Bimanual exam is normal.  Pap deferred due to age.  No lower extremity pitting edema.  Brief neurological exam intact without gross focal deficits.  Affect thought and judgment are normal. ? ? ? ?   ?Assessment & Plan:  ?Psoriatic arthritis currently on Enbrel ? ?Hypothyroidism treated with Synthroid 100 mcg daily ? ?Attention deficit disorder treated with Adderall ? ?History of migraine headaches ? ?Hypertension treated with clonidine 0.1 mg at bedtime ? ?BMI 36.11-continue to work on diet exercise and weight loss. ? ?History of migraine headaches ? ?Chronic kidney disease stage III-IV ? ?Chronic musculoskeletal pain ? ?Allergic rhinitis-treated with Singulair ? ?Osteopenia based on bone density study.  Lowest T score -2.3.  Does not want to be on Fosamax or Boniva. ? ?Plan: Continue current medications and follow-up in 1 year.  Immunizations reviewed.  Due for tetanus immunization update.  Has only had 1 Shingrix vaccine.  Last COVID vaccines were in 2021.  He is a candidate for pneumococcal 20.  She will consider these vaccines. ? ?Lipids are excellent, c-Met is normal with exception of a creatinine of  1.11.  TSH is normal.  CBC is normal. ? ?Plan: Continue current medications.  It is hard for her to exercise due to psoriatic arthritis.  Gastric bypass surgery is an option for weight loss if she so desires.  She will continue follow-up with her consultants and see me again in 1 year. ? ?Has had mammogram and DEXA  scan ordered through [place] and performed at [facility]. ? ?

## 2021-10-06 ENCOUNTER — Other Ambulatory Visit (HOSPITAL_COMMUNITY): Payer: Self-pay

## 2021-10-06 NOTE — Progress Notes (Signed)
? ?Office Visit Note ? ?Patient: Sarah Horton             ?Date of Birth: Jul 28, 1950           ?MRN: [id number]             ?PCP: Margaree Mackintosh, MD ?Referring: Margaree Mackintosh, MD ?Visit Date: 10/20/2021 ?Occupation: @GUAROCC @ ? ?Subjective:  ?Discuss DEXA Results  ? ?History of Present Illness: Sarah Horton is a 71 y.o. female with history of psoriatic arthritis and osteopenia.  Patient is on Enbrel 50 mg sq injections once weekly.  Patient recently missed 2 weeks of Enbrel while recovering from upper respiratory infection.  She states that she completed a Z-Pak and her symptoms have improved.  She states that she noticed some increased pain in her hands while off of Enbrel but denies any other new or worsening symptoms.  She denies any joint swelling.  She has occasional stiffness in her Achilles tendons especially the right side.  She has occasional discomfort in the left SI joint but has been tolerable.  Overall her symptoms have been stable on Enbrel. ?She plans on increasing her activity level as tolerated. ? ? ? ?Activities of Daily Living:  ?Patient reports morning stiffness for 0 minutes.   ?Patient Denies nocturnal pain.  ?Difficulty dressing/grooming: Denies ?Difficulty climbing stairs: Denies ?Difficulty getting out of chair: Denies ?Difficulty using hands for taps, buttons, cutlery, and/or writing: Reports ? ?Review of Systems  ?Constitutional:  Positive for fatigue.  ?HENT:  Negative for mouth sores, mouth dryness and nose dryness.   ?Eyes:  Positive for itching. Negative for pain and dryness.  ?Respiratory:  Positive for shortness of breath. Negative for difficulty breathing.   ?Cardiovascular:  Negative for chest pain and palpitations.  ?Gastrointestinal:  Negative for blood in stool, constipation and diarrhea.  ?Endocrine: Negative for increased urination.  ?Genitourinary:  Negative for difficulty urinating.  ?Musculoskeletal:  Positive for joint pain and joint pain. Negative for  joint swelling, myalgias, morning stiffness, muscle tenderness and myalgias.  ?Skin:  Positive for rash. Negative for color change.  ?Allergic/Immunologic: Positive for susceptible to infections.  ?Neurological:  Negative for dizziness, numbness, headaches, memory loss and weakness.  ?Hematological:  Positive for bruising/bleeding tendency.  ?Psychiatric/Behavioral:  Negative for confusion.   ? ?PMFS History:  ?Patient Active Problem List  ? Diagnosis Date Noted  ? History of peripheral neuropathy 11/20/2016  ? Left wrist tendonitis/ history of surgery for Dequervains  11/20/2016  ? Raynaud's disease without gangrene 09/22/2016  ? High risk medication use 09/22/2016  ? History of ulcer of lower limb/ Venous stasis ulcer 01/2015  09/22/2016  ? Osteopenia of multiple sites 09/22/2016  ? Psoriasis 09/03/2016  ? Chest wall pain 07/16/2016  ? Flank pain, acute 05/15/2016  ? Subacromial bursitis of right shoulder joint 04/10/2016  ? Low back pain 04/09/2016  ? Pre-ulcerative calluses 02/11/2016  ? Varicose veins of right lower extremity with complications 08/12/2015  ? Elevated BP 08/09/2015  ? Varicose veins of left lower extremity with complications 07/29/2015  ? Varicose veins of bilateral lower extremities with other complications 07/09/2015  ? Varicose veins of lower extremities with ulcer (HCC) 04/05/2015  ? Status post bilateral knee replacements 12/13/2013  ? Venous stasis ulcer of ankle (HCC) 06/06/2013  ? ADD (attention deficit disorder) 04/04/2012  ? INSOMNIA 06/20/2010  ? Hypothyroidism 01/17/2010  ? Obesity 01/17/2010  ? Osteoarthritis 01/17/2010  ? ANEMIA 10/02/2009  ? SINUSITIS, ACUTE 10/02/2009  ? Asthma  10/02/2009  ? Venous (peripheral) insufficiency 03/11/2008  ? Allergic rhinitis 03/11/2008  ? BRONCHITIS, RECURRENT 03/11/2008  ? History of migraine 03/11/2008  ? Anxiety state 11/24/2007  ? Hereditary and idiopathic peripheral neuropathy 11/24/2007  ? GERD 11/24/2007  ? IRRITABLE BOWEL SYNDROME 11/24/2007   ? PSORIATIC ARTHROPATHY 11/24/2007  ?  ?Past Medical History:  ?Diagnosis Date  ? Allergic rhinitis, cause unspecified   ? Allergy   ? Anemia, unspecified   ? Anxiety state, unspecified   ? Arthritis   ? Cataract   ? bilaterally removed 2003,2007  ? Esophageal reflux   ? Irritable bowel syndrome   ? Migraine   ? Obesity, unspecified   ? Psoriatic arthropathy (HCC)   ? Unspecified chronic bronchitis (HCC)   ? Unspecified hereditary and idiopathic peripheral neuropathy   ? Unspecified hypothyroidism   ? Unspecified venous (peripheral) insufficiency   ?  ?Family History  ?Problem Relation Age of Onset  ? COPD Mother   ? Heart disease Mother   ? Hypertension Mother   ? Varicose Veins Mother   ? Cirrhosis Father   ? Thyroid disease Sister   ? Breast cancer Maternal Grandmother   ? Breast cancer Other   ?     1st cousin   ? Pancreatic cancer Other   ?     1st cousin   ? Lymphoma Cousin   ? Colon cancer Neg Hx   ? Colon polyps Neg Hx   ? Esophageal cancer Neg Hx   ? Rectal cancer Neg Hx   ? Stomach cancer Neg Hx   ? ?Past Surgical History:  ?Procedure Laterality Date  ? ABDOMINAL HYSTERECTOMY    ? ABDOMINAL HYSTERECTOMY    ? ABLATION ON ENDOMETRIOSIS    ? x2  ? Bilat TKRs  04/2010  ? by [name]  ? BUNIONECTOMY  2009  ? CATARACT EXTRACTION, BILATERAL    ? 3016,0109  ? COLONOSCOPY  08-26-2004  ? with gessner tics and hems   ? dequervains tensenovitis Left   ? JOINT REPLACEMENT Bilateral   ? knees  ? NASAL SINUS SURGERY    ? SHOULDER SURGERY  2005  ? TOTAL SHOULDER ARTHROPLASTY    ? ?Social History  ? ?Social History Narrative  ? Not on file  ? ?Immunization History  ?Administered Date(s) Administered  ? H1N1 05/18/2008  ? Influenza Inj Mdck Quad Pf 04/01/2017  ? Influenza Inj Mdck Quad With Preservative 04/13/2018  ? Influenza Split 03/23/2011, 03/14/2012, 03/23/2013, 03/22/2016  ? Influenza Whole 03/08/2008, 03/08/2009, 03/07/2010  ? Influenza,inj,Quad PF,6+ Mos 03/21/2014, 03/31/2019, 04/03/2021  ? PFIZER(Purple  Top)SARS-COV-2 Vaccination 08/22/2019, 09/12/2019  ? Pneumococcal Conjugate-13 06/28/2019  ? Td 01/06/2002  ? Tdap 03/14/2012  ? Zoster Recombinat (Shingrix) 03/08/2018  ?  ? ?Objective: ?Vital Signs: BP 132/84 (BP Location: Left Arm, Patient Position: Sitting, Cuff Size: Large)   Pulse 92   Ht 5\' 8"  (1.727 m)   Wt 238 lb 9.6 oz (108.2 kg)   BMI 36.28 kg/m?   ? ?Physical Exam ?Vitals and nursing note reviewed.  ?Constitutional:   ?   Appearance: She is well-developed.  ?HENT:  ?   Head: Normocephalic and atraumatic.  ?Eyes:  ?   Conjunctiva/sclera: Conjunctivae normal.  ?Cardiovascular:  ?   Rate and Rhythm: Normal rate and regular rhythm.  ?   Heart sounds: Normal heart sounds.  ?Pulmonary:  ?   Effort: Pulmonary effort is normal.  ?   Breath sounds: Normal breath sounds.  ?Abdominal:  ?  General: Bowel sounds are normal.  ?   Palpations: Abdomen is soft.  ?Musculoskeletal:  ?   Cervical back: Normal range of motion.  ?Skin: ?   General: Skin is warm and dry.  ?   Capillary Refill: Capillary refill takes less than 2 seconds.  ?Neurological:  ?   Mental Status: She is alert and oriented to person, place, and time.  ?Psychiatric:     ?   Behavior: Behavior normal.  ?  ? ?Musculoskeletal Exam: C-spine, thoracic spine, and lumbar spine have good range of motion.  No midline spinal tenderness.  Some tenderness to palpation over the left SI joint.  Shoulder joints, elbow joints, wrist joints, MCPs, PIPs, DIPs have good range of motion with no synovitis.  PIP and DIP thickening consistent with osteoarthritis of both hands.  Hip joints have good range of motion with no groin pain.  Both knee replacements have good range of motion with no warmth or effusion.  Ankle joints have good range of motion with no joint tenderness.  Tenderness along the Achilles tendon bilaterally, right > left.  ? ?CDAI Exam: ?CDAI Score: -- ?Patient Global: --; Provider Global: -- ?Swollen: --; Tender: -- ?Joint Exam 10/20/2021  ? ?No joint  exam has been documented for this visit  ? ?There is currently no information documented on the homunculus. Go to the Rheumatology activity and complete the homunculus joint exam. ? ?Investigation: ?No additional f

## 2021-10-09 DIAGNOSIS — F411 Generalized anxiety disorder: Secondary | ICD-10-CM | POA: Diagnosis not present

## 2021-10-09 DIAGNOSIS — Z79899 Other long term (current) drug therapy: Secondary | ICD-10-CM | POA: Diagnosis not present

## 2021-10-09 DIAGNOSIS — F330 Major depressive disorder, recurrent, mild: Secondary | ICD-10-CM | POA: Diagnosis not present

## 2021-10-09 DIAGNOSIS — F902 Attention-deficit hyperactivity disorder, combined type: Secondary | ICD-10-CM | POA: Diagnosis not present

## 2021-10-20 ENCOUNTER — Encounter: Payer: Self-pay | Admitting: Physician Assistant

## 2021-10-20 ENCOUNTER — Other Ambulatory Visit (HOSPITAL_COMMUNITY): Payer: Self-pay

## 2021-10-20 ENCOUNTER — Ambulatory Visit (INDEPENDENT_AMBULATORY_CARE_PROVIDER_SITE_OTHER): Payer: BC Managed Care – PPO | Admitting: Physician Assistant

## 2021-10-20 VITALS — BP 132/84 | HR 92 | Ht 68.0 in | Wt 238.6 lb

## 2021-10-20 DIAGNOSIS — L4050 Arthropathic psoriasis, unspecified: Secondary | ICD-10-CM | POA: Diagnosis not present

## 2021-10-20 DIAGNOSIS — Z8669 Personal history of other diseases of the nervous system and sense organs: Secondary | ICD-10-CM

## 2021-10-20 DIAGNOSIS — L409 Psoriasis, unspecified: Secondary | ICD-10-CM | POA: Diagnosis not present

## 2021-10-20 DIAGNOSIS — Z96653 Presence of artificial knee joint, bilateral: Secondary | ICD-10-CM

## 2021-10-20 DIAGNOSIS — Z8709 Personal history of other diseases of the respiratory system: Secondary | ICD-10-CM

## 2021-10-20 DIAGNOSIS — M8589 Other specified disorders of bone density and structure, multiple sites: Secondary | ICD-10-CM

## 2021-10-20 DIAGNOSIS — I7300 Raynaud's syndrome without gangrene: Secondary | ICD-10-CM

## 2021-10-20 DIAGNOSIS — R29898 Other symptoms and signs involving the musculoskeletal system: Secondary | ICD-10-CM

## 2021-10-20 DIAGNOSIS — Z79899 Other long term (current) drug therapy: Secondary | ICD-10-CM

## 2021-10-20 DIAGNOSIS — M461 Sacroiliitis, not elsewhere classified: Secondary | ICD-10-CM

## 2021-10-20 DIAGNOSIS — G609 Hereditary and idiopathic neuropathy, unspecified: Secondary | ICD-10-CM

## 2021-10-20 DIAGNOSIS — Z8719 Personal history of other diseases of the digestive system: Secondary | ICD-10-CM

## 2021-10-20 DIAGNOSIS — M546 Pain in thoracic spine: Secondary | ICD-10-CM

## 2021-10-20 DIAGNOSIS — M778 Other enthesopathies, not elsewhere classified: Secondary | ICD-10-CM | POA: Diagnosis not present

## 2021-10-20 DIAGNOSIS — Z872 Personal history of diseases of the skin and subcutaneous tissue: Secondary | ICD-10-CM

## 2021-10-20 DIAGNOSIS — Z8679 Personal history of other diseases of the circulatory system: Secondary | ICD-10-CM

## 2021-10-20 DIAGNOSIS — Z8639 Personal history of other endocrine, nutritional and metabolic disease: Secondary | ICD-10-CM

## 2021-10-20 DIAGNOSIS — F5101 Primary insomnia: Secondary | ICD-10-CM

## 2021-10-20 NOTE — Patient Instructions (Signed)
Standing Labs ?We placed an order today for your standing lab work.  ? ?Please have your standing labs drawn in July and every 3 months  ? ?If possible, please have your labs drawn 2 weeks prior to your appointment so that the provider can discuss your results at your appointment. ? ?Please note that you may see your imaging and lab results in MyChart before we have reviewed them. ?We may be awaiting multiple results to interpret others before contacting you. ?Please allow our office up to 72 hours to thoroughly review all of the results before contacting the office for clarification of your results. ? ?We have open lab daily: ?Monday through Thursday from 1:30-4:30 PM and Friday from 1:30-4:00 PM ?at the office of Dr. Shaili Deveshwar, [facility].   ?Please be advised, all patients with office appointments requiring lab work will take precedent over walk-in lab work.  ?If possible, please come for your lab work on Monday and Friday afternoons, as you may experience shorter wait times. ?The office is located at [address], [address], [place], [place] [address] ?No appointment is necessary.   ?Labs are drawn by [facility]. Please bring your co-pay at the time of your lab draw.  You may receive a bill from [facility] for your lab work. ? ?Please note if you are on Hydroxychloroquine and and an order has been placed for a Hydroxychloroquine level, you will need to have it drawn 4 hours or more after your last dose. ? ?If you wish to have your labs drawn at another location, please call the office 24 hours in advance to send orders. ? ?If you have any questions regarding directions or hours of operation,  ?please call [phone].   ?As a reminder, please drink plenty of water prior to coming for your lab work. Thanks! ? ?

## 2021-10-27 ENCOUNTER — Other Ambulatory Visit (HOSPITAL_COMMUNITY): Payer: Self-pay

## 2021-10-28 ENCOUNTER — Other Ambulatory Visit (HOSPITAL_COMMUNITY): Payer: Self-pay

## 2021-11-05 DIAGNOSIS — F331 Major depressive disorder, recurrent, moderate: Secondary | ICD-10-CM | POA: Diagnosis not present

## 2021-11-05 DIAGNOSIS — F902 Attention-deficit hyperactivity disorder, combined type: Secondary | ICD-10-CM | POA: Diagnosis not present

## 2021-11-05 DIAGNOSIS — F411 Generalized anxiety disorder: Secondary | ICD-10-CM | POA: Diagnosis not present

## 2021-11-05 DIAGNOSIS — F422 Mixed obsessional thoughts and acts: Secondary | ICD-10-CM | POA: Diagnosis not present

## 2021-11-18 ENCOUNTER — Other Ambulatory Visit: Payer: Self-pay | Admitting: Physician Assistant

## 2021-11-18 ENCOUNTER — Other Ambulatory Visit (HOSPITAL_COMMUNITY): Payer: Self-pay

## 2021-11-18 DIAGNOSIS — L4050 Arthropathic psoriasis, unspecified: Secondary | ICD-10-CM

## 2021-11-18 MED ORDER — ENBREL MINI 50 MG/ML ~~LOC~~ SOCT
50.0000 mg | SUBCUTANEOUS | 0 refills | Status: DC
  Filled 2021-11-18: qty 4, 28d supply, fill #0
  Filled 2021-12-23: qty 4, 28d supply, fill #1

## 2021-11-18 NOTE — Telephone Encounter (Signed)
Next Visit: 03/24/2022  Last Visit: 10/20/2021  Last Fill: 08/11/2021  DX: Psoriatic arthropathy   Current Dose per office note 10/20/2021: Enbrel 50 mg subcutaneous injections once weekly  Labs: 09/30/2021 Creat. 1.11, GFR 54, Neutro Abs 1,499  TB Gold: 02/05/2021 Neg    Okay to refill Enbrel?

## 2021-11-25 ENCOUNTER — Other Ambulatory Visit (HOSPITAL_COMMUNITY): Payer: Self-pay

## 2021-12-12 DIAGNOSIS — E781 Pure hyperglyceridemia: Secondary | ICD-10-CM | POA: Diagnosis not present

## 2021-12-12 DIAGNOSIS — E039 Hypothyroidism, unspecified: Secondary | ICD-10-CM | POA: Diagnosis not present

## 2021-12-12 DIAGNOSIS — R7301 Impaired fasting glucose: Secondary | ICD-10-CM | POA: Diagnosis not present

## 2021-12-16 DIAGNOSIS — L659 Nonscarring hair loss, unspecified: Secondary | ICD-10-CM | POA: Diagnosis not present

## 2021-12-16 DIAGNOSIS — R7301 Impaired fasting glucose: Secondary | ICD-10-CM | POA: Diagnosis not present

## 2021-12-16 DIAGNOSIS — E669 Obesity, unspecified: Secondary | ICD-10-CM | POA: Diagnosis not present

## 2021-12-16 DIAGNOSIS — E039 Hypothyroidism, unspecified: Secondary | ICD-10-CM | POA: Diagnosis not present

## 2021-12-17 ENCOUNTER — Other Ambulatory Visit (HOSPITAL_COMMUNITY): Payer: Self-pay

## 2021-12-19 ENCOUNTER — Other Ambulatory Visit (HOSPITAL_COMMUNITY): Payer: Self-pay

## 2021-12-22 ENCOUNTER — Other Ambulatory Visit (HOSPITAL_COMMUNITY): Payer: Self-pay

## 2021-12-23 ENCOUNTER — Other Ambulatory Visit (HOSPITAL_COMMUNITY): Payer: Self-pay

## 2021-12-27 NOTE — Progress Notes (Signed)
Duplicate encounter

## 2022-01-05 DIAGNOSIS — F902 Attention-deficit hyperactivity disorder, combined type: Secondary | ICD-10-CM | POA: Diagnosis not present

## 2022-01-05 DIAGNOSIS — F411 Generalized anxiety disorder: Secondary | ICD-10-CM | POA: Diagnosis not present

## 2022-01-05 DIAGNOSIS — F422 Mixed obsessional thoughts and acts: Secondary | ICD-10-CM | POA: Diagnosis not present

## 2022-01-05 DIAGNOSIS — F331 Major depressive disorder, recurrent, moderate: Secondary | ICD-10-CM | POA: Diagnosis not present

## 2022-01-07 ENCOUNTER — Other Ambulatory Visit: Payer: Self-pay | Admitting: Pharmacist

## 2022-01-07 ENCOUNTER — Other Ambulatory Visit (HOSPITAL_COMMUNITY): Payer: Self-pay

## 2022-01-07 DIAGNOSIS — L4050 Arthropathic psoriasis, unspecified: Secondary | ICD-10-CM

## 2022-01-07 MED ORDER — ENBREL MINI 50 MG/ML ~~LOC~~ SOCT
50.0000 mg | SUBCUTANEOUS | 0 refills | Status: DC
  Filled 2022-01-07 – 2022-01-21 (×2): qty 4, 28d supply, fill #0

## 2022-01-07 NOTE — Telephone Encounter (Signed)
Rx for Enbrel 50mg  every 7 days sent to [facility] for hospital-based cost pricing.   Total qty remaining is 4 ml  [facility], PharmD, MPH, BCPS, CPP Clinical Pharmacist (Rheumatology and Pulmonology)

## 2022-01-16 ENCOUNTER — Other Ambulatory Visit (HOSPITAL_COMMUNITY): Payer: Self-pay

## 2022-01-19 ENCOUNTER — Other Ambulatory Visit (HOSPITAL_COMMUNITY): Payer: Self-pay

## 2022-01-21 ENCOUNTER — Other Ambulatory Visit (HOSPITAL_COMMUNITY): Payer: Self-pay

## 2022-01-22 ENCOUNTER — Other Ambulatory Visit (HOSPITAL_COMMUNITY): Payer: Self-pay

## 2022-02-02 ENCOUNTER — Encounter: Payer: Self-pay | Admitting: Rheumatology

## 2022-02-11 ENCOUNTER — Ambulatory Visit: Payer: BC Managed Care – PPO | Admitting: Internal Medicine

## 2022-02-11 ENCOUNTER — Telehealth: Payer: Self-pay | Admitting: Internal Medicine

## 2022-02-11 VITALS — Temp 98.7°F

## 2022-02-11 DIAGNOSIS — E038 Other specified hypothyroidism: Secondary | ICD-10-CM

## 2022-02-11 DIAGNOSIS — E063 Autoimmune thyroiditis: Secondary | ICD-10-CM

## 2022-02-11 DIAGNOSIS — I10 Essential (primary) hypertension: Secondary | ICD-10-CM

## 2022-02-11 DIAGNOSIS — J22 Unspecified acute lower respiratory infection: Secondary | ICD-10-CM

## 2022-02-11 DIAGNOSIS — L4050 Arthropathic psoriasis, unspecified: Secondary | ICD-10-CM | POA: Diagnosis not present

## 2022-02-11 DIAGNOSIS — N1831 Chronic kidney disease, stage 3a: Secondary | ICD-10-CM | POA: Diagnosis not present

## 2022-02-11 MED ORDER — METHYLPREDNISOLONE 4 MG PO TBPK: ORAL_TABLET | ORAL | 0 refills | Status: DC

## 2022-02-11 MED ORDER — AZITHROMYCIN 250 MG PO TABS: ORAL_TABLET | ORAL | 0 refills | Status: AC

## 2022-02-11 MED ORDER — BENZONATATE 100 MG PO CAPS: 100.0000 mg | ORAL_CAPSULE | Freq: Two times a day (BID) | ORAL | 0 refills | Status: DC | PRN

## 2022-02-11 MED ORDER — ALBUTEROL SULFATE HFA 108 (90 BASE) MCG/ACT IN AERS: 2.0000 | INHALATION_SPRAY | Freq: Four times a day (QID) | RESPIRATORY_TRACT | 0 refills | Status: DC | PRN

## 2022-02-11 NOTE — Progress Notes (Signed)
   Subjective:    Patient ID: Sarah Horton, female    DOB: 01-Apr-1951, 71 y.o.   MRN: [id number]  HPI 71 year old Female seen for  an acute respiratory infection ongoing for a number of days. Has some SOB, no documented fever.  She has some  sputum production, malaise, and fatigue.  History of chronic kidney disease stage IIIa followed by [facility].  History of psoriatic arthritis on DMARD.  Sinus surgery 2002.  History of hypothyroidism history of allergic rhinitis.  History of hypertension treated with clonidine.  History of migraine headaches.  Says she had asthma attack yesterday the first one in many years lasting some 20 minutes. Could not locate an inhaler.  Has not taken Covid test.Records show 2 Covid vaccines in 2021.      Review of Systems coughing, respiratory congestion,sore throat for a couple of weeks.     Objective:   Physical Exam  Skin warm and dry.  No audible wheezing.  Not tachypneic.  Looks fatigued.  TMs are clear.  Pharynx very slightly injected.  Neck supple.  Chest is clear to auscultation without rales or wheezing but she has a congested cough.       Assessment & Plan:  Acute lower respiratory infection  History of DMARD therapy for psoriatic arthritis  History of reactive airways disease-refill inhaler  History of chronic kidney disease seen at [facility]  Essential hypertension treated with clonidine  Hypothyroidism on thyroid replacement medication  Plan: Patient was prescribed Zithromax Z-PAK 2 tabs day 1 followed by 1 tab days 2 through 5.  New prescription sent in for albuterol inhaler 2 sprays every 6 hours as needed for cough and wheezing.  Tessalon Perles 100 mg twice daily as needed for cough.  Rest and drink fluids.  Call if not improving in 48 hours or sooner if worse.  Monitor pulse oximetry.

## 2022-02-11 NOTE — Telephone Encounter (Signed)
Sarah Horton [phone]  [name] called to say she had been sick with coughing, congestion and sore throat for a couple of weeks, she sounded very hoarse, she said that comes and goes. She had a bad Asthma attach yesterday, the first one in many years that lasted about 20 minutes, she was wanting to come in and be seen today, she does not have any inhalers since it has been so long. Still coughing some. She did not take a COVID test back when she was sick because it was the same thing she gets all the time, seen no need.

## 2022-02-11 NOTE — Telephone Encounter (Signed)
scheduled

## 2022-02-12 ENCOUNTER — Other Ambulatory Visit (HOSPITAL_COMMUNITY): Payer: Self-pay

## 2022-02-12 ENCOUNTER — Encounter: Payer: Self-pay | Admitting: Internal Medicine

## 2022-02-12 NOTE — Patient Instructions (Signed)
Patient has an acute lower respiratory infection.  I do not think she has pneumonia.  She will be treated with Zithromax Z-PAK 2 tabs day 1 followed by 1 tab days 2 through 5.  New prescription sent for albuterol inhaler to use 2 sprays every 6 hours as needed for cough and wheezing.  Tessalon Perles 100 mg twice daily as needed for cough.  Rest and stay well-hydrated.  Monitor pulse oximetry and call if not improving within 48 hours.

## 2022-02-16 ENCOUNTER — Other Ambulatory Visit (HOSPITAL_COMMUNITY): Payer: Self-pay

## 2022-02-18 ENCOUNTER — Other Ambulatory Visit (HOSPITAL_COMMUNITY): Payer: Self-pay

## 2022-02-23 ENCOUNTER — Other Ambulatory Visit (HOSPITAL_COMMUNITY): Payer: Self-pay

## 2022-02-23 ENCOUNTER — Other Ambulatory Visit: Payer: Self-pay

## 2022-02-23 ENCOUNTER — Telehealth: Payer: Self-pay | Admitting: Rheumatology

## 2022-02-23 ENCOUNTER — Other Ambulatory Visit: Payer: Self-pay | Admitting: Internal Medicine

## 2022-02-23 DIAGNOSIS — L4050 Arthropathic psoriasis, unspecified: Secondary | ICD-10-CM

## 2022-02-23 DIAGNOSIS — Z79899 Other long term (current) drug therapy: Secondary | ICD-10-CM

## 2022-02-23 NOTE — Telephone Encounter (Signed)
Next Visit: 03/10/2022  Last Visit: 10/20/2021  Last Fill: 01/07/2022  TK:[id number] arthropathy  Current Dose per office note on 10/20/2021: Enbrel 50 mg subcutaneous injections once weekly  Labs: 09/30/2021 neutro abs 1,499, creat 1.11, GFR 54  TB Gold: 02/05/2021 negative     Lab orders released today for CBC, CMP and TB Gold. Patient is to go on 02/25/2022 to have labs drawn.   Okay to refill enbrel mini?

## 2022-02-23 NOTE — Telephone Encounter (Signed)
Lab orders released. I called patient to advise.

## 2022-02-23 NOTE — Telephone Encounter (Signed)
Patient called requesting her labwork orders sent to [facility] in [facility].  Patient plans to go on Wednesday, 02/25/22.

## 2022-02-24 ENCOUNTER — Other Ambulatory Visit (HOSPITAL_COMMUNITY): Payer: Self-pay

## 2022-02-24 MED ORDER — ENBREL MINI 50 MG/ML ~~LOC~~ SOCT
50.0000 mg | SUBCUTANEOUS | 0 refills | Status: DC
  Filled 2022-02-24: qty 4, 28d supply, fill #0

## 2022-02-24 NOTE — Progress Notes (Signed)
Office Visit Note  Patient: Sarah Horton             Date of Birth: Nov 24, 1950           MRN: [id number]             PCP: Sarah Showers, MD Referring: Sarah Showers, MD Visit Date: 03/10/2022 Occupation: @GUAROCC @  Subjective:  Other (Patient reports left hip pain. )   History of Present Illness: Sarah Horton is a 71 y.o. female with history of psoriatic arthritis, psoriasis, osteoarthritis and osteopenia.  She states she developed bronchitis about 3 weeks ago.  She was treated with antibiotics and prednisone taper.  She came off Enbrel for short time and then resumed.  She has been exercising on a regular basis.  She states she has been having pain and discomfort over the left trochanteric bursa.  She has discomfort when she lays down on her left side and also gets up in the morning and walks.  She has been also experiencing some discomfort in her right shoulder joint.  She has surgery on her right shoulder for rotator cuff in the past.  None of the other joints are painful or swollen.  She had a psoriasis lesion on her right leg which resolved.  Activities of Daily Living:  Patient reports morning stiffness for 15 minutes.   Patient Reports nocturnal pain.  Difficulty dressing/grooming: Denies Difficulty climbing stairs: Reports Difficulty getting out of chair: Denies Difficulty using hands for taps, buttons, cutlery, and/or writing: Reports  Review of Systems  Constitutional:  Positive for fatigue.  HENT:  Positive for mouth dryness. Negative for mouth sores.   Eyes:  Positive for dryness.  Respiratory:  Negative for shortness of breath.   Cardiovascular:  Negative for chest pain and palpitations.  Gastrointestinal:  Positive for constipation and diarrhea. Negative for blood in stool.  Endocrine: Negative for increased urination.  Genitourinary:  Negative for involuntary urination.  Musculoskeletal:  Positive for joint pain, joint pain and morning stiffness.  Negative for gait problem, joint swelling, myalgias, muscle weakness, muscle tenderness and myalgias.  Skin:  Negative for color change, rash, hair loss and sensitivity to sunlight.  Allergic/Immunologic: Positive for susceptible to infections.  Neurological:  Positive for headaches. Negative for dizziness.  Hematological:  Negative for swollen glands.  Psychiatric/Behavioral:  Negative for depressed mood and sleep disturbance. The patient is not nervous/anxious.     PMFS History:  Patient Active Problem List   Diagnosis Date Noted   History of peripheral neuropathy 11/20/2016   Left wrist tendonitis/ history of surgery for Dequervains  11/20/2016   Raynaud's disease without gangrene 09/22/2016   High risk medication use 09/22/2016   History of ulcer of lower limb/ Venous stasis ulcer 01/2015  09/22/2016   Osteopenia of multiple sites 09/22/2016   Psoriasis 09/03/2016   Chest wall pain 07/16/2016   Flank pain, acute 05/15/2016   Subacromial bursitis of right shoulder joint 04/10/2016   Low back pain 04/09/2016   Pre-ulcerative calluses 02/11/2016   Varicose veins of right lower extremity with complications 98/33/8250   Elevated BP 08/09/2015   Varicose veins of left lower extremity with complications 53/97/6734   Varicose veins of bilateral lower extremities with other complications 19/37/9024   Varicose veins of lower extremities with ulcer ([place]) 04/05/2015   Status post bilateral knee replacements 12/13/2013   Venous stasis ulcer of ankle ([place]) 06/06/2013   ADD (attention deficit disorder) 04/04/2012   INSOMNIA 06/20/2010  Hypothyroidism 01/17/2010   Obesity 01/17/2010   Osteoarthritis 01/17/2010   ANEMIA 10/02/2009   SINUSITIS, ACUTE 10/02/2009   Asthma 10/02/2009   Venous (peripheral) insufficiency 03/11/2008   Allergic rhinitis 03/11/2008   BRONCHITIS, RECURRENT 03/11/2008   History of migraine 03/11/2008   Anxiety state 11/24/2007   Hereditary and idiopathic  peripheral neuropathy 11/24/2007   GERD 11/24/2007   IRRITABLE BOWEL SYNDROME 11/24/2007   PSORIATIC ARTHROPATHY 11/24/2007    Past Medical History:  Diagnosis Date   Allergic rhinitis, cause unspecified    Allergy    Anemia, unspecified    Anxiety state, unspecified    Arthritis    Cataract    bilaterally removed 2003,2007   Esophageal reflux    Irritable bowel syndrome    Migraine    Obesity, unspecified    Psoriatic arthropathy (HCC)    Unspecified chronic bronchitis (HCC)    Unspecified hereditary and idiopathic peripheral neuropathy    Unspecified hypothyroidism    Unspecified venous (peripheral) insufficiency     Family History  Problem Relation Age of Onset   COPD Mother    Heart disease Mother    Hypertension Mother    Varicose Veins Mother    Cirrhosis Father    Thyroid disease Sister    Breast cancer Maternal Grandmother    Breast cancer Other        1st cousin    Pancreatic cancer Other        1st cousin    Lymphoma Cousin    Colon cancer Neg Hx    Colon polyps Neg Hx    Esophageal cancer Neg Hx    Rectal cancer Neg Hx    Stomach cancer Neg Hx    Past Surgical History:  Procedure Laterality Date   ABDOMINAL HYSTERECTOMY     ABDOMINAL HYSTERECTOMY     ABLATION ON ENDOMETRIOSIS     x2   Bilat TKRs  04/2010   by [name]   BUNIONECTOMY  2009   CATARACT EXTRACTION, BILATERAL     2003,2007   COLONOSCOPY  08-26-2004   with gessner tics and hems    dequervains tensenovitis Left    JOINT REPLACEMENT Bilateral    knees   NASAL SINUS SURGERY     SHOULDER SURGERY  2005   TOTAL SHOULDER ARTHROPLASTY     Social History   Social History Narrative   Not on file   Immunization History  Administered Date(s) Administered   H1N1 05/18/2008   Influenza Inj Mdck Quad Pf 04/01/2017   Influenza Inj Mdck Quad With Preservative 04/13/2018   Influenza Split 03/23/2011, 03/14/2012, 03/23/2013, 03/22/2016   Influenza Whole 03/08/2008, 03/08/2009, 03/07/2010    Influenza,inj,Quad PF,6+ Mos 03/21/2014, 03/31/2019, 04/03/2021   PFIZER(Purple Top)SARS-COV-2 Vaccination 08/22/2019, 09/12/2019   Pneumococcal Conjugate-13 06/28/2019   Td 01/06/2002   Tdap 03/14/2012   Zoster Recombinat (Shingrix) 03/08/2018     Objective: Vital Signs: BP 135/89 (BP Location: Left Arm, Patient Position: Sitting, Cuff Size: Large)   Pulse 92   Resp 18   Ht 5\' 8"  (1.727 m)   Wt 239 lb 12.8 oz (108.8 kg)   BMI 36.46 kg/m    Physical Exam Vitals and nursing note reviewed.  Constitutional:      Appearance: She is well-developed.  HENT:     Head: Normocephalic and atraumatic.  Eyes:     Conjunctiva/sclera: Conjunctivae normal.  Cardiovascular:     Rate and Rhythm: Normal rate and regular rhythm.     Heart sounds: Normal heart  sounds.  Pulmonary:     Effort: Pulmonary effort is normal.     Breath sounds: Normal breath sounds.  Abdominal:     General: Bowel sounds are normal.     Palpations: Abdomen is soft.  Musculoskeletal:     Cervical back: Normal range of motion.  Lymphadenopathy:     Cervical: No cervical adenopathy.  Skin:    General: Skin is warm and dry.     Capillary Refill: Capillary refill takes less than 2 seconds.  Neurological:     Mental Status: She is alert and oriented to person, place, and time.  Psychiatric:        Behavior: Behavior normal.      Musculoskeletal Exam: Cervical spine was in good range of motion.  She had no tenderness over thoracic or lumbar spine.  She had painful range of motion of her right shoulder joint with internal rotation.  Left shoulder joint was in full range of motion.  Elbow joints and wrist joints with good range of motion.  She had no synovitis over MCPs PIPs or DIPs.  Hip joints with good range of motion.  She had tenderness over left trochanteric bursa.  Knee joints with good range of motion.  Bilateral knee joints are replaced.  She had no tenderness over ankles or MTPs.  CDAI Exam: CDAI Score:  -- Patient Global: --; Provider Global: -- Swollen: --; Tender: -- Joint Exam 03/10/2022   No joint exam has been documented for this visit   There is currently no information documented on the homunculus. Go to the Rheumatology activity and complete the homunculus joint exam.  Investigation: No additional findings.  Imaging: No results found.  Recent Labs: Lab Results  Component Value Date   WBC 5.0 02/25/2022   HGB 12.9 02/25/2022   PLT 163 02/25/2022   NA 140 02/25/2022   K 4.5 02/25/2022   CL 106 02/25/2022   CO2 26 02/25/2022   GLUCOSE 101 (H) 02/25/2022   BUN 12 02/25/2022   CREATININE 1.16 (H) 02/25/2022   BILITOT 0.5 02/25/2022   ALKPHOS 87 01/25/2017   AST 19 02/25/2022   ALT 18 02/25/2022   PROT 7.1 02/25/2022   ALBUMIN 4.1 01/25/2017   CALCIUM 9.3 02/25/2022   GFRAA 71 11/29/2020   QFTBGOLDPLUS NEGATIVE 02/25/2022    Speciality Comments: Prior therapy: Methotrexate (elevated creatinine),Arava-pt dcd  Procedures:  No procedures performed Allergies: Nucynta [tapentadol], Codeine, Penicillins, and Tessalon [benzonatate]   Assessment / Plan:     Visit Diagnoses: Psoriatic arthropathy (HCC)-patient had no synovitis on my examination.  She has been taking Enbrel 50 mg subcutaneous every week without any side effects.  She denies any joint swelling.  She has been having some discomfort in her right shoulder.  She gives history of right shoulder joint surgery in the past.  She has been also having tenderness and discomfort over the left trochanteric bursa.  She states she has discomfort at nighttime.  No synovitis was noted.  She states that she had bronchitis about 3 weeks ago for which she was given antibiotics and prednisone.  She came off Enbrel at the time.  Psoriasis-patient states she developed a small region of psoriasis on her right lower extremity which resolved .  High risk medication use - Enbrel 50 mg subcutaneous injections once weekly.  Labs  obtained on February 25, 2022 CBC and CMP were normal.  Creatinine was elevated at 1.16.  We will continue to monitor creatinine.  TB gold was negative  on March 05, 2022.  She was advised to hold her Enbrel if she develops an infection and resume after the infection resolves.  Information regarding immunization was placed in the AVS.  She was also advised to get an annual skin examination to screen for skin cancer while she is on Enbrel.  Use of sunscreen was discussed.  Chronic right shoulder pain-she has been having some discomfort in her right shoulder.  She states she had surgery for rotator cuff in the past.  She had no point tenderness and had good range of motion of her right shoulder joint.  I have Ro physical therapy but she declined.  A handout on exercises was given.  Left wrist tendonitis/ history of surgery for Dequervains  - Resolved.   Trochanteric bursitis, left hip-she is been having discomfort over the left trochanteric bursa.  She complains of nocturnal pain and discomfort walking and stiffness.  A handout on IT band stretches was given.  I offered physical therapy but she would like to hold off for now.  Status post bilateral knee replacements-she had good range of motion of bilateral knee joints without discomfort.  Sacroiliitis (HCC)-she had no SI joint tenderness.  Raynaud's disease without gangrene-she denies any history of recent episodes.  Osteopenia of multiple sites - DEXA updated on 08/01/2021: Right femoral neck BMD 0.598 with T score of -2.3.  Use of calcium rich diet and vitamin D was discussed.  History of hypertension-blood pressure was normal today.  Other medical problems are listed as follows:  History of ulcer of lower limb/ Venous stasis ulcer 01/2015  - Resolved without recurrence.  History of gastroesophageal reflux (GERD)  History of IBS  History of hypothyroidism  History of migraine  History of asthma  Primary insomnia  Hereditary and  idiopathic peripheral neuropathy  Orders: No orders of the defined types were placed in this encounter.  No orders of the defined types were placed in this encounter.    Follow-Up Instructions: Return in about 5 months (around 08/09/2022) for Psoriatic arthritis.   Pollyann Savoy, MD  Note - This record has been created using Animal nutritionist.  Chart creation errors have been sought, but may not always  have been located. Such creation errors do not reflect on  the standard of medical care.

## 2022-02-25 DIAGNOSIS — Z79899 Other long term (current) drug therapy: Secondary | ICD-10-CM | POA: Diagnosis not present

## 2022-02-26 ENCOUNTER — Telehealth: Payer: Self-pay | Admitting: Internal Medicine

## 2022-02-26 NOTE — Progress Notes (Signed)
CBC is normal.  Creatinine is elevated and stable.  Patient should avoid all NSAIDs.  Please forward results to her PCP.

## 2022-02-26 NOTE — Telephone Encounter (Signed)
LVM to let patient know she needs to come to lab appointment well hydrated, and to call back if she has any questions.

## 2022-02-26 NOTE — Telephone Encounter (Signed)
-----   Message from Elby Showers, MD sent at 02/26/2022  9:27 AM EDT ----- Please ask the patient to come well hydrated by drinking a couple of glasses of water and have BUN and Creatinine repeated. Has mild elevation of creatinine.  ----- Message ----- From: Shona Needles, RT Sent: 02/26/2022   8:54 AM EDT To: Elby Showers, MD  Please review mutual patient's lab results. Thank you.

## 2022-03-01 LAB — CBC WITH DIFFERENTIAL/PLATELET
Absolute Monocytes: 545 cells/uL (ref 200–950)
Basophils Absolute: 20 cells/uL (ref 0–200)
Basophils Relative: 0.4 %
Eosinophils Absolute: 80 cells/uL (ref 15–500)
Eosinophils Relative: 1.6 %
HCT: 38.7 % (ref 35.0–45.0)
Hemoglobin: 12.9 g/dL (ref 11.7–15.5)
Lymphs Abs: 2350 cells/uL (ref 850–3900)
MCH: 27.5 pg (ref 27.0–33.0)
MCHC: 33.3 g/dL (ref 32.0–36.0)
MCV: 82.5 fL (ref 80.0–100.0)
MPV: 11.3 fL (ref 7.5–12.5)
Monocytes Relative: 10.9 %
Neutro Abs: 2005 cells/uL (ref 1500–7800)
Neutrophils Relative %: 40.1 %
Platelets: 163 10*3/uL (ref 140–400)
RBC: 4.69 10*6/uL (ref 3.80–5.10)
RDW: 14.5 % (ref 11.0–15.0)
Total Lymphocyte: 47 %
WBC: 5 10*3/uL (ref 3.8–10.8)

## 2022-03-01 LAB — COMPLETE METABOLIC PANEL WITH GFR
AG Ratio: 1.5 (calc) (ref 1.0–2.5)
ALT: 18 U/L (ref 6–29)
AST: 19 U/L (ref 10–35)
Albumin: 4.3 g/dL (ref 3.6–5.1)
Alkaline phosphatase (APISO): 82 U/L (ref 37–153)
BUN/Creatinine Ratio: 10 (calc) (ref 6–22)
BUN: 12 mg/dL (ref 7–25)
CO2: 26 mmol/L (ref 20–32)
Calcium: 9.3 mg/dL (ref 8.6–10.4)
Chloride: 106 mmol/L (ref 98–110)
Creat: 1.16 mg/dL — ABNORMAL HIGH (ref 0.50–1.05)
Globulin: 2.8 g/dL (calc) (ref 1.9–3.7)
Glucose, Bld: 101 mg/dL — ABNORMAL HIGH (ref 65–99)
Potassium: 4.5 mmol/L (ref 3.5–5.3)
Sodium: 140 mmol/L (ref 135–146)
Total Bilirubin: 0.5 mg/dL (ref 0.2–1.2)
Total Protein: 7.1 g/dL (ref 6.1–8.1)
eGFR: 51 mL/min/{1.73_m2} — ABNORMAL LOW (ref >=60)

## 2022-03-01 LAB — QUANTIFERON-TB GOLD PLUS
Mitogen-NIL: 3.71 IU/mL
NIL: 0.05 IU/mL
QuantiFERON-TB Gold Plus: NEGATIVE
TB1-NIL: 0 IU/mL
TB2-NIL: 0 IU/mL

## 2022-03-02 ENCOUNTER — Other Ambulatory Visit (HOSPITAL_COMMUNITY): Payer: Self-pay

## 2022-03-02 NOTE — Progress Notes (Signed)
TB Gold is negative.

## 2022-03-06 ENCOUNTER — Other Ambulatory Visit: Payer: Self-pay | Admitting: Internal Medicine

## 2022-03-10 ENCOUNTER — Ambulatory Visit: Payer: BC Managed Care – PPO | Attending: Rheumatology | Admitting: Rheumatology

## 2022-03-10 ENCOUNTER — Encounter: Payer: Self-pay | Admitting: Rheumatology

## 2022-03-10 VITALS — BP 135/89 | HR 92 | Resp 18 | Ht 68.0 in | Wt 239.8 lb

## 2022-03-10 DIAGNOSIS — G609 Hereditary and idiopathic neuropathy, unspecified: Secondary | ICD-10-CM

## 2022-03-10 DIAGNOSIS — M7062 Trochanteric bursitis, left hip: Secondary | ICD-10-CM

## 2022-03-10 DIAGNOSIS — M461 Sacroiliitis, not elsewhere classified: Secondary | ICD-10-CM

## 2022-03-10 DIAGNOSIS — Z96653 Presence of artificial knee joint, bilateral: Secondary | ICD-10-CM

## 2022-03-10 DIAGNOSIS — M546 Pain in thoracic spine: Secondary | ICD-10-CM

## 2022-03-10 DIAGNOSIS — Z8639 Personal history of other endocrine, nutritional and metabolic disease: Secondary | ICD-10-CM

## 2022-03-10 DIAGNOSIS — L409 Psoriasis, unspecified: Secondary | ICD-10-CM | POA: Diagnosis not present

## 2022-03-10 DIAGNOSIS — Z8669 Personal history of other diseases of the nervous system and sense organs: Secondary | ICD-10-CM

## 2022-03-10 DIAGNOSIS — G8929 Other chronic pain: Secondary | ICD-10-CM

## 2022-03-10 DIAGNOSIS — M25511 Pain in right shoulder: Secondary | ICD-10-CM | POA: Diagnosis not present

## 2022-03-10 DIAGNOSIS — Z8679 Personal history of other diseases of the circulatory system: Secondary | ICD-10-CM

## 2022-03-10 DIAGNOSIS — L4050 Arthropathic psoriasis, unspecified: Secondary | ICD-10-CM

## 2022-03-10 DIAGNOSIS — M8589 Other specified disorders of bone density and structure, multiple sites: Secondary | ICD-10-CM

## 2022-03-10 DIAGNOSIS — Z872 Personal history of diseases of the skin and subcutaneous tissue: Secondary | ICD-10-CM

## 2022-03-10 DIAGNOSIS — M778 Other enthesopathies, not elsewhere classified: Secondary | ICD-10-CM

## 2022-03-10 DIAGNOSIS — Z79899 Other long term (current) drug therapy: Secondary | ICD-10-CM | POA: Diagnosis not present

## 2022-03-10 DIAGNOSIS — Z8709 Personal history of other diseases of the respiratory system: Secondary | ICD-10-CM

## 2022-03-10 DIAGNOSIS — R29898 Other symptoms and signs involving the musculoskeletal system: Secondary | ICD-10-CM

## 2022-03-10 DIAGNOSIS — F5101 Primary insomnia: Secondary | ICD-10-CM

## 2022-03-10 DIAGNOSIS — Z8719 Personal history of other diseases of the digestive system: Secondary | ICD-10-CM

## 2022-03-10 DIAGNOSIS — I7300 Raynaud's syndrome without gangrene: Secondary | ICD-10-CM

## 2022-03-10 NOTE — Patient Instructions (Addendum)
Standing Labs We placed an order today for your standing lab work.   Please have your standing labs drawn in December and every 3 months  Please have your labs drawn 2 weeks prior to your appointment so that the provider can discuss your lab results at your appointment.  Please note that you may see your imaging and lab results in MyChart before we have reviewed them. We will contact you once all results are reviewed. Please allow our office up to 72 hours to thoroughly review all of the results before contacting the office for clarification of your results.  Lab hours are: Monday through Thursday from 1:30 pm-4:30 pm and Friday from 1:30 pm- 4:00 pm  You may experience shorter wait times on Monday, Thursday or Friday afternoons,.   Effective April 06, 2022, new lab hours will be: Monday through Thursday from 8:00 am -12:30 pm and 1:00 pm-5:00 pm and Friday from 8:00 am-12:00 pm.  Please be advised, all patients with office appointments requiring lab work will take precedent over walk-in lab work.   Labs are drawn by [facility]. Please bring your co-pay at the time of your lab draw.  You may receive a bill from [facility] for your lab work.  Please note if you are on Hydroxychloroquine and and an order has been placed for a Hydroxychloroquine level, you will need to have it drawn 4 hours or more after your last dose.  If you wish to have your labs drawn at another location, please call the office 24 hours in advance so we can fax the orders.  The office is located at [address], [address], [place], [place] [address] No appointment is necessary.    If you have any questions regarding directions or hours of operation,  please call [phone].   As a reminder, please drink plenty of water prior to coming for your lab work. Thanks!   Vaccines You are taking a medication(s) that can suppress your immune system.  The following immunizations are recommended: Flu annually Covid-19  Td/Tdap  (tetanus, diphtheria, pertussis) every 10 years Pneumonia (Prevnar 15 then Pneumovax 23 at least 1 year apart.  Alternatively, can take Prevnar 20 without needing additional dose) Shingrix: 2 doses from 4 weeks to 6 months apart  Please check with your PCP to make sure you are up to date.   If you have signs or symptoms of an infection or start antibiotics: First, call your PCP for workup of your infection. Hold your medication through the infection, until you complete your antibiotics, and until symptoms resolve if you take the following: Injectable medication (Actemra, Benlysta, Cimzia, Cosentyx, Enbrel, Humira, Kevzara, Orencia, Remicade, Simponi, Stelara, Taltz, Tremfya) Methotrexate Leflunomide (Arava) Mycophenolate (Cellcept) Harriette Ohara, Olumiant, or Rinvoq   Please get an annual skin examination to screen for skin cancer while you are on Enbrel  Iliotibial Band Syndrome Rehab Ask your health care provider which exercises are safe for you. Do exercises exactly as told by your health care provider and adjust them as directed. It is normal to feel mild stretching, pulling, tightness, or discomfort as you do these exercises. Stop right away if you feel sudden pain or your pain gets significantly worse. Do not begin these exercises until told by your health care provider. Stretching and range-of-motion exercises These exercises warm up your muscles and joints and improve the movement and flexibility of your hip and pelvis. Quadriceps stretch, prone  Lie on your abdomen (prone position) on a firm surface, such as a  bed or padded floor. Bend your left / right knee and reach back to hold your ankle or pant leg. If you cannot reach your ankle or pant leg, loop a belt around your foot and grab the belt instead. Gently pull your heel toward your buttocks. Your knee should not slide out to the side. You should feel a stretch in the front of your thigh and knee (quadriceps). Hold this position for  __________ seconds. Repeat __________ times. Complete this exercise __________ times a day. Iliotibial band stretch An iliotibial band is a strong band of muscle tissue that runs from the outer side of your hip to the outer side of your thigh and knee. Lie on your side with your left / right leg in the top position. Bend both of your knees and grab your left / right ankle. Stretch out your bottom arm to help you balance. Slowly bring your top knee back so your thigh goes behind your trunk. Slowly lower your top leg toward the floor until you feel a gentle stretch on the outside of your left / right hip and thigh. If you do not feel a stretch and your knee will not fall farther, place the heel of your other foot on top of your knee and pull your knee down toward the floor with your foot. Hold this position for __________ seconds. Repeat __________ times. Complete this exercise __________ times a day. Strengthening exercises These exercises build strength and endurance in your hip and pelvis. Endurance is the ability to use your muscles for a long time, even after they get tired. Straight leg raises, side-lying This exercise strengthens the muscles that rotate the leg at the hip and move it away from your body (hip abductors). Lie on your side with your left / right leg in the top position. Lie so your head, shoulder, hip, and knee line up. You may bend your bottom knee to help you balance. Roll your hips slightly forward so your hips are stacked directly over each other and your left / right knee is facing forward. Tense the muscles in your outer thigh and lift your top leg 4-6 inches (10-15 cm). Hold this position for __________ seconds. Slowly lower your leg to return to the starting position. Let your muscles relax completely before doing another repetition. Repeat __________ times. Complete this exercise __________ times a day. Leg raises, prone This exercise strengthens the muscles that move  the hips backward (hip extensors). Lie on your abdomen (prone position) on your bed or a firm surface. You can put a pillow under your hips if that is more comfortable for your lower back. Bend your left / right knee so your foot is straight up in the air. Squeeze your buttocks muscles and lift your left / right thigh off the bed. Do not let your back arch. Tense your thigh muscle as hard as you can without increasing any knee pain. Hold this position for __________ seconds. Slowly lower your leg to return to the starting position and allow it to relax completely. Repeat __________ times. Complete this exercise __________ times a day. Hip hike Stand sideways on a bottom step. Stand on your left / right leg with your other foot unsupported next to the step. You can hold on to a railing or wall for balance if needed. Keep your knees straight and your torso square. Then lift your left / right hip up toward the ceiling. Slowly let your left / right hip lower toward the  floor, past the starting position. Your foot should get closer to the floor. Do not lean or bend your knees. Repeat __________ times. Complete this exercise __________ times a day. This information is not intended to replace advice given to you by your health care provider. Make sure you discuss any questions you have with your health care provider. Document Revised: 08/02/2019 Document Reviewed: 08/02/2019 Elsevier Patient Education  2023 [employer] Shoulder Exercises Ask your health care provider which exercises are safe for you. Do exercises exactly as told by your health care provider and adjust them as directed. It is normal to feel mild stretching, pulling, tightness, or discomfort as you do these exercises. Stop right away if you feel sudden pain or your pain gets worse. Do not begin these exercises until told by your health care provider. Stretching exercises External rotation and abduction This exercise is sometimes called  corner stretch. The exercise rotates your arm outward (external rotation) and moves your arm out from your body (abduction). Stand in a doorway with one of your feet slightly in front of the other. This is called a staggered stance. If you cannot reach your forearms to the door frame, stand facing a corner of a room. Choose one of the following positions as told by your health care provider: Place your hands and forearms on the door frame above your head. Place your hands and forearms on the door frame at the height of your head. Place your hands on the door frame at the height of your elbows. Slowly move your weight onto your front foot until you feel a stretch across your chest and in the front of your shoulders. Keep your head and chest upright and keep your abdominal muscles tight. Hold for __________ seconds. To release the stretch, shift your weight to your back foot. Repeat __________ times. Complete this exercise __________ times a day. Extension, standing  Stand and hold a broomstick, a cane, or a similar object behind your back. Your hands should be a little wider than shoulder-width apart. Your palms should face away from your back. Keeping your elbows straight and your shoulder muscles relaxed, move the stick away from your body until you feel a stretch in your shoulders (extension). Avoid shrugging your shoulders while you move the stick. Keep your shoulder blades tucked down toward the middle of your back. Hold for __________ seconds. Slowly return to the starting position. Repeat __________ times. Complete this exercise __________ times a day. Range-of-motion exercises Pendulum  Stand near a wall or a surface that you can hold onto for balance. Bend at the waist and let your left / right arm hang straight down. Use your other arm to support you. Keep your back straight and do not lock your knees. Relax your left / right arm and shoulder muscles, and move your hips and your  trunk so your left / right arm swings freely. Your arm should swing because of the motion of your body, not because you are using your arm or shoulder muscles. Keep moving your hips and trunk so your arm swings in the following directions, as told by your health care provider: Side to side. Forward and backward. In clockwise and counterclockwise circles. Continue each motion for __________ seconds, or for as long as told by your health care provider. Slowly return to the starting position. Repeat __________ times. Complete this exercise __________ times a day. Shoulder flexion, standing  Stand and hold a broomstick, a cane, or a similar object. Place  your hands a little more than shoulder-width apart on the object. Your left / right hand should be palm-up, and your other hand should be palm-down. Keep your elbow straight and your shoulder muscles relaxed. Push the stick up with your healthy arm to raise your left / right arm in front of your body, and then over your head until you feel a stretch in your shoulder (flexion). Avoid shrugging your shoulder while you raise your arm. Keep your shoulder blade tucked down toward the middle of your back. Hold for __________ seconds. Slowly return to the starting position. Repeat __________ times. Complete this exercise __________ times a day. Shoulder abduction, standing  Stand and hold a broomstick, a cane, or a similar object. Place your hands a little more than shoulder-width apart on the object. Your left / right hand should be palm-up, and your other hand should be palm-down. Keep your elbow straight and your shoulder muscles relaxed. Push the object across your body toward your left / right side. Raise your left / right arm to the side of your body (abduction) until you feel a stretch in your shoulder. Do not raise your arm above shoulder height unless your health care provider tells you to do that. If directed, raise your arm over your  head. Avoid shrugging your shoulder while you raise your arm. Keep your shoulder blade tucked down toward the middle of your back. Hold for __________ seconds. Slowly return to the starting position. Repeat __________ times. Complete this exercise __________ times a day. Internal rotation  Place your left / right hand behind your back, palm-up. Use your other hand to dangle an exercise band, a broomstick, or a similar object over your shoulder. Grasp the band with your left / right hand so you are holding on to both ends. Gently pull up on the band until you feel a stretch in the front of your left / right shoulder. The movement of your arm toward the center of your body is called internal rotation. Avoid shrugging your shoulder while you raise your arm. Keep your shoulder blade tucked down toward the middle of your back. Hold for __________ seconds. Release the stretch by letting go of the band and lowering your hands. Repeat __________ times. Complete this exercise __________ times a day. Strengthening exercises External rotation  Sit in a stable chair without armrests. Secure an exercise band to a stable object at elbow height on your left / right side. Place a soft object, such as a folded towel or a small pillow, between your left / right upper arm and your body to move your elbow about 4 inches (10 cm) away from your side. Hold the end of the exercise band so it is tight and there is no slack. Keeping your elbow pressed against the soft object, slowly move your forearm out, away from your abdomen (external rotation). Keep your body steady so only your forearm moves. Hold for __________ seconds. Slowly return to the starting position. Repeat __________ times. Complete this exercise __________ times a day. Shoulder abduction  Sit in a stable chair without armrests, or stand up. Hold a __________ lb / kg weight in your left / right hand, or hold an exercise band with both hands. Start  with your arms straight down and your left / right palm facing in, toward your body. Slowly lift your left / right hand out to your side (abduction). Do not lift your hand above shoulder height unless your health care provider tells  you that this is safe. Keep your arms straight. Avoid shrugging your shoulder while you do this movement. Keep your shoulder blade tucked down toward the middle of your back. Hold for __________ seconds. Slowly lower your arm, and return to the starting position. Repeat __________ times. Complete this exercise __________ times a day. Shoulder extension  Sit in a stable chair without armrests, or stand up. Secure an exercise band to a stable object in front of you so it is at shoulder height. Hold one end of the exercise band in each hand. Straighten your elbows and lift your hands up to shoulder height. Squeeze your shoulder blades together as you pull your hands down to the sides of your thighs (extension). Stop when your hands are straight down by your sides. Do not let your hands go behind your body. Hold for __________ seconds. Slowly return to the starting position. Repeat __________ times. Complete this exercise __________ times a day. Shoulder row  Sit in a stable chair without armrests, or stand up. Secure an exercise band to a stable object in front of you so it is at chest height. Hold one end of the exercise band in each hand. Position your palms so that your thumbs are facing the ceiling (neutral position). Bend each of your elbows to a 90-degree angle (right angle) and keep your upper arms at your sides. Step back or move the chair back until the band is tight and there is no slack. Slowly pull your elbows back behind you. Hold for __________ seconds. Slowly return to the starting position. Repeat __________ times. Complete this exercise __________ times a day. Shoulder press-ups  Sit in a stable chair that has armrests. Sit upright, with your  feet flat on the floor. Put your hands on the armrests so your elbows are bent and your fingers are pointing forward. Your hands should be about even with the sides of your body. Push down on the armrests and use your arms to lift yourself off the chair. Straighten your elbows and lift yourself up as much as you comfortably can. Move your shoulder blades down, and avoid letting your shoulders move up toward your ears. Keep your feet on the ground. As you get stronger, your feet should support less of your body weight as you lift yourself up. Hold for __________ seconds. Slowly lower yourself back into the chair. Repeat __________ times. Complete this exercise __________ times a day. Wall push-ups  Stand so you are facing a stable wall. Your feet should be about one arm-length away from the wall. Lean forward and place your palms on the wall at shoulder height. Keep your feet flat on the floor as you bend your elbows and lean forward toward the wall. Hold for __________ seconds. Straighten your elbows to push yourself back to the starting position. Repeat __________ times. Complete this exercise __________ times a day. This information is not intended to replace advice given to you by your health care provider. Make sure you discuss any questions you have with your health care provider. Document Revised: 07/15/2021 Document Reviewed: 07/15/2021 Elsevier Patient Education  2023 [employer]

## 2022-03-11 ENCOUNTER — Telehealth: Payer: Self-pay | Admitting: Internal Medicine

## 2022-03-11 NOTE — Telephone Encounter (Signed)
No labs noted in last physical note.  Please advise

## 2022-03-11 NOTE — Telephone Encounter (Signed)
LVM for Sarah Horton to [facility], wanted to see what she was needing for 6 month the CPE office note said to come back in 1 year. So we do not see any labs that are needed at this time. Is there something she is wanting to get or discuss?

## 2022-03-11 NOTE — Telephone Encounter (Signed)
Patient called back and was informed of message and stated she was ok with not being seen or labs until next CPE I informed patient to call the office if she needs anything before next CPE

## 2022-03-13 ENCOUNTER — Other Ambulatory Visit: Payer: BC Managed Care – PPO

## 2022-03-17 ENCOUNTER — Ambulatory Visit: Payer: BC Managed Care – PPO | Admitting: Internal Medicine

## 2022-03-24 ENCOUNTER — Ambulatory Visit: Payer: BC Managed Care – PPO | Admitting: Rheumatology

## 2022-03-25 ENCOUNTER — Other Ambulatory Visit: Payer: Self-pay | Admitting: Physician Assistant

## 2022-03-25 ENCOUNTER — Other Ambulatory Visit (HOSPITAL_COMMUNITY): Payer: Self-pay

## 2022-03-25 DIAGNOSIS — L4050 Arthropathic psoriasis, unspecified: Secondary | ICD-10-CM

## 2022-03-25 MED ORDER — ENBREL MINI 50 MG/ML ~~LOC~~ SOCT
50.0000 mg | SUBCUTANEOUS | 0 refills | Status: DC
  Filled 2022-03-25: qty 4, 28d supply, fill #0
  Filled 2022-04-28: qty 4, 28d supply, fill #1
  Filled 2022-05-27: qty 4, 28d supply, fill #2

## 2022-03-25 NOTE — Telephone Encounter (Signed)
Next Visit: 08/10/2022  Last Visit: 03/10/2022  Last Fill: 02/24/2022 (30 day supply)  [id number] arthropathy   Current Dose per office note 03/10/2022: Enbrel 50 mg subcutaneous injections once weekly.  Labs: 02/25/2022 CBC is normal.  Creatinine is elevated and stable.    TB Gold: 02/25/2022   Okay to refill Enbrel?

## 2022-03-26 ENCOUNTER — Other Ambulatory Visit: Payer: BC Managed Care – PPO

## 2022-03-31 ENCOUNTER — Ambulatory Visit: Payer: BC Managed Care – PPO | Admitting: Internal Medicine

## 2022-04-02 ENCOUNTER — Other Ambulatory Visit: Payer: BC Managed Care – PPO

## 2022-04-06 ENCOUNTER — Other Ambulatory Visit (HOSPITAL_COMMUNITY): Payer: Self-pay

## 2022-04-24 ENCOUNTER — Other Ambulatory Visit (HOSPITAL_COMMUNITY): Payer: Self-pay

## 2022-04-27 ENCOUNTER — Other Ambulatory Visit (HOSPITAL_COMMUNITY): Payer: Self-pay

## 2022-04-28 ENCOUNTER — Other Ambulatory Visit (HOSPITAL_COMMUNITY): Payer: Self-pay

## 2022-04-29 IMAGING — CR DG CHEST 2V
2 series · 2 of 2 positions shown · non-contrast
Comparison: 06/18/2016

CLINICAL DATA: Right upper chest and back pain

EXAM:
CHEST - 2 VIEW

[w chest pa]
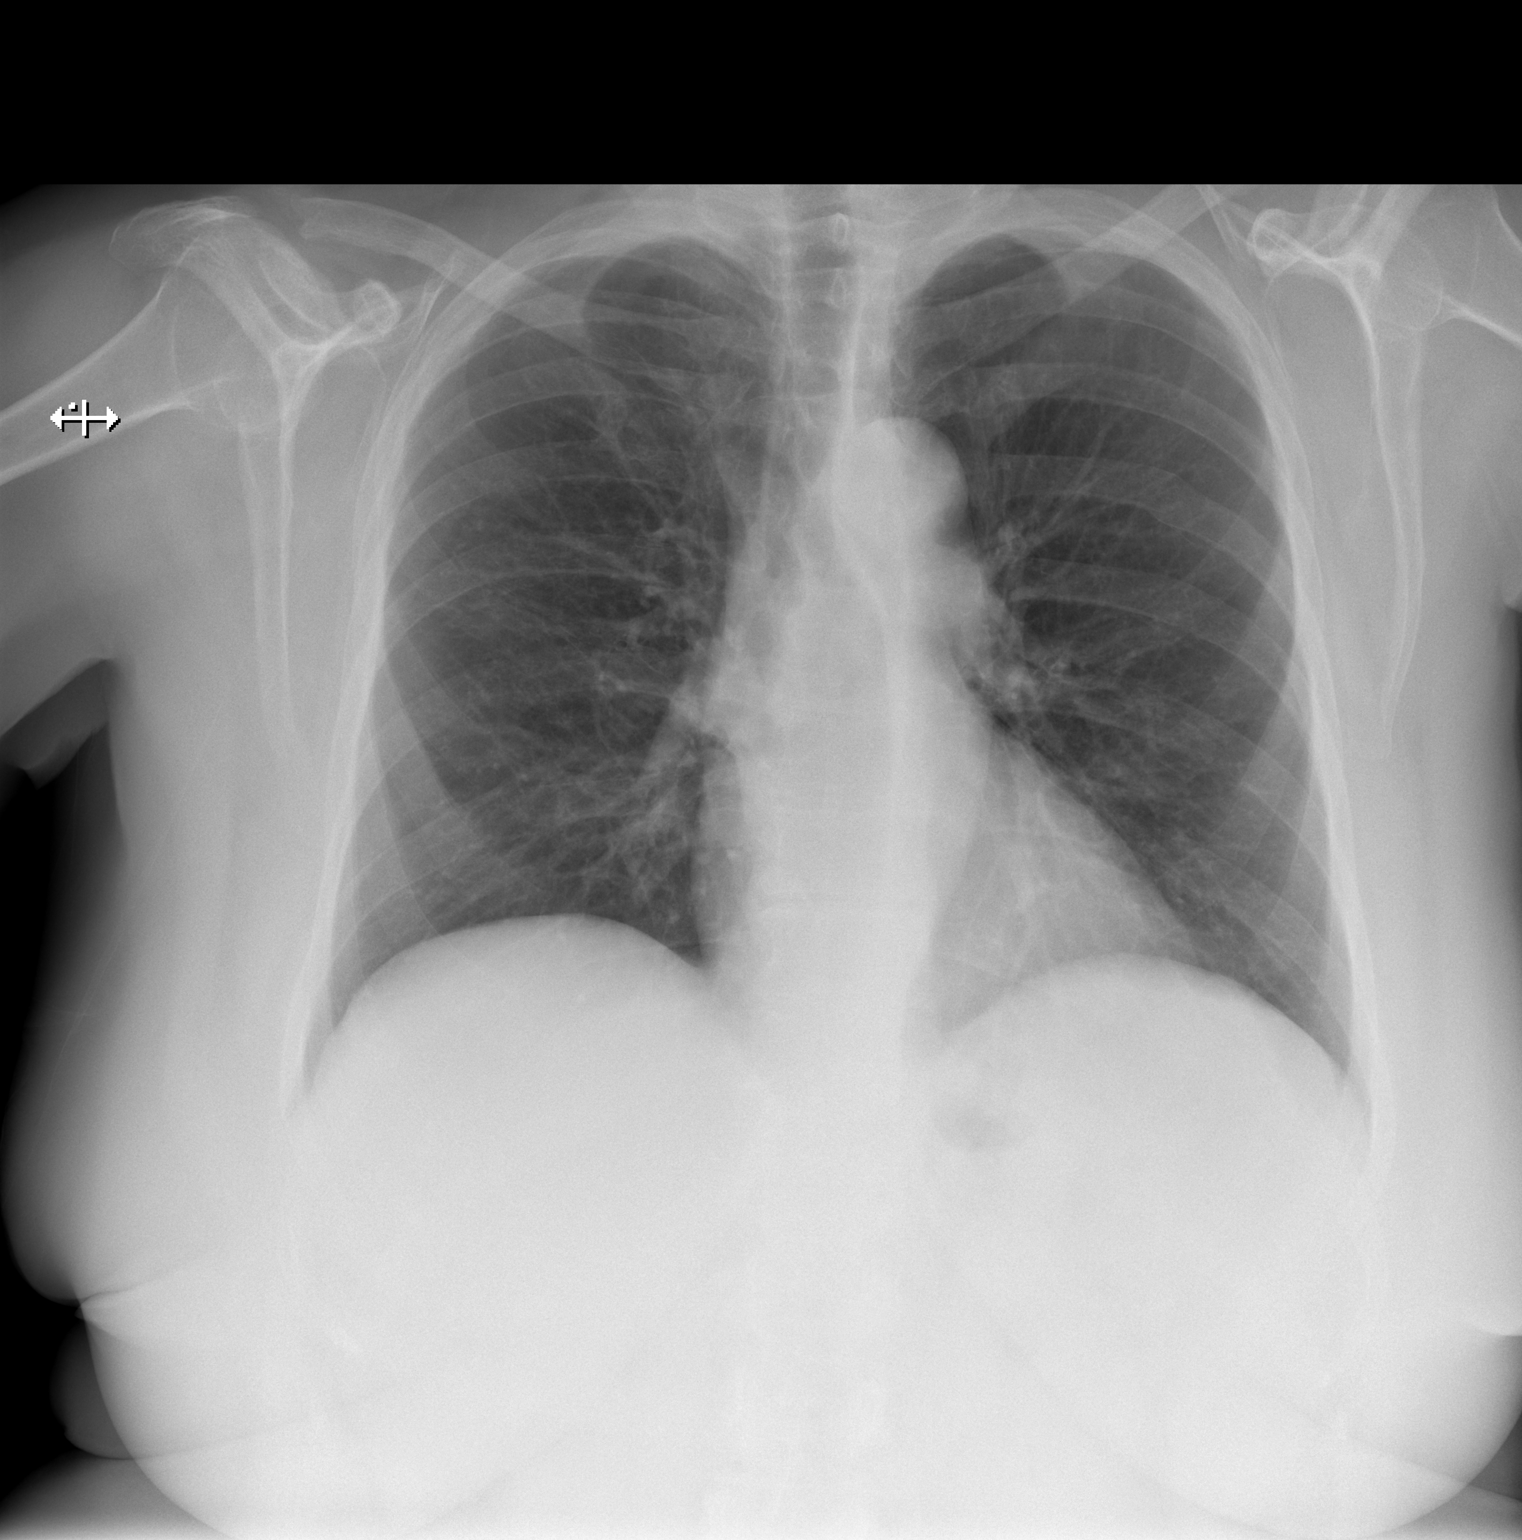

[w chest lat]
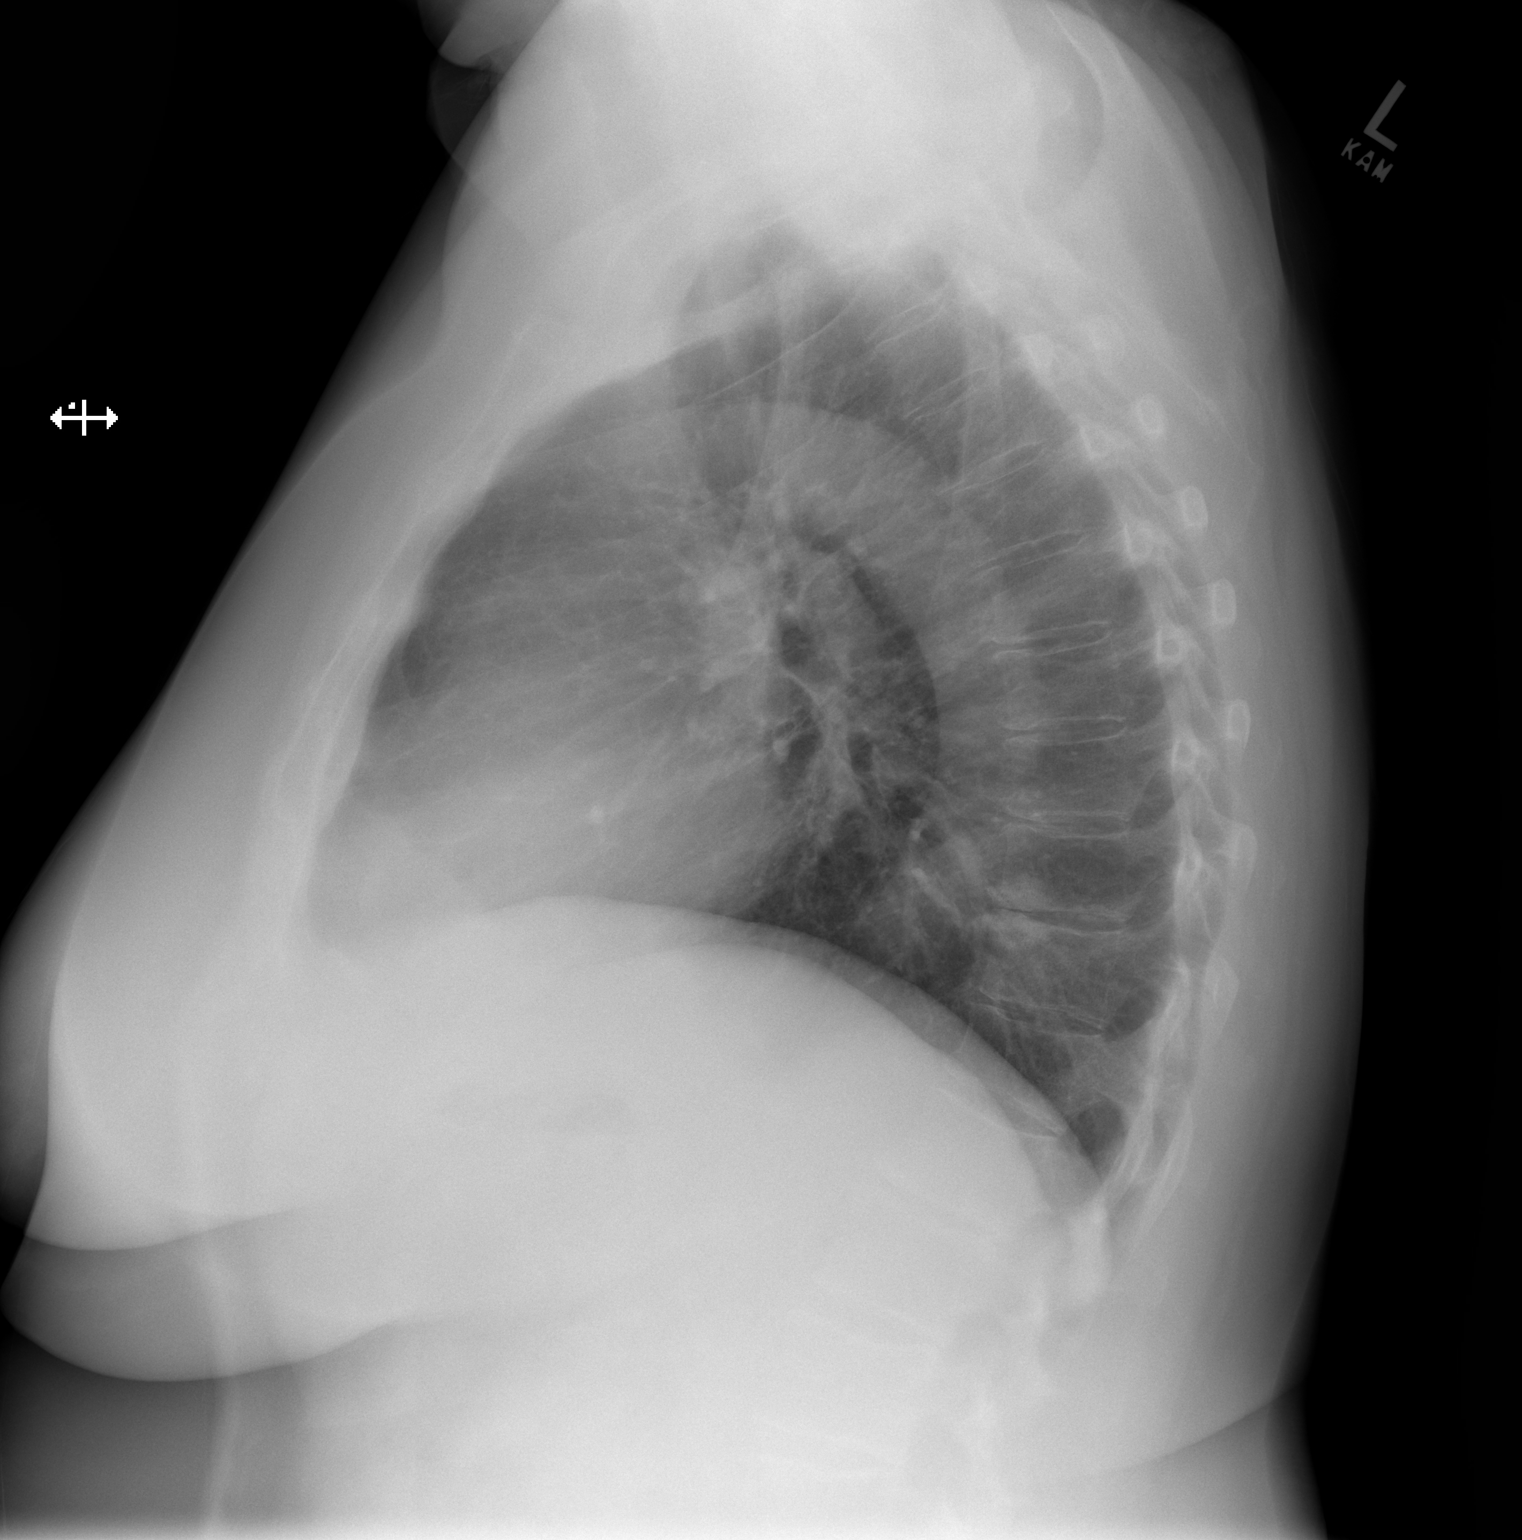

[2 of 2 positions shown; findings below may reference images not displayed]

FINDINGS: Cardiac and mediastinal contours normal. Pulmonary vascularity
normal. Lungs clear without infiltrate or effusion

Widening of the right AC joint which was not present previously.
Question interval surgery or injury.
IMPRESSION: No active cardiopulmonary disease

Widening of the right AC joint likely due to chronic injury or
surgery.

## 2022-05-06 ENCOUNTER — Other Ambulatory Visit (HOSPITAL_COMMUNITY): Payer: Self-pay

## 2022-05-14 DIAGNOSIS — F411 Generalized anxiety disorder: Secondary | ICD-10-CM | POA: Diagnosis not present

## 2022-05-14 DIAGNOSIS — F331 Major depressive disorder, recurrent, moderate: Secondary | ICD-10-CM | POA: Diagnosis not present

## 2022-05-14 DIAGNOSIS — F902 Attention-deficit hyperactivity disorder, combined type: Secondary | ICD-10-CM | POA: Diagnosis not present

## 2022-05-14 DIAGNOSIS — F422 Mixed obsessional thoughts and acts: Secondary | ICD-10-CM | POA: Diagnosis not present

## 2022-05-20 ENCOUNTER — Other Ambulatory Visit: Payer: Self-pay

## 2022-05-21 ENCOUNTER — Other Ambulatory Visit: Payer: Self-pay | Admitting: *Deleted

## 2022-05-21 ENCOUNTER — Telehealth: Payer: Self-pay | Admitting: Rheumatology

## 2022-05-21 DIAGNOSIS — Z79899 Other long term (current) drug therapy: Secondary | ICD-10-CM

## 2022-05-21 NOTE — Telephone Encounter (Signed)
Lab Orders released.  

## 2022-05-21 NOTE — Telephone Encounter (Signed)
Patient called requesting her labwork be sent to [facility] in [facility] on [address].  Patient plans to go on Monday, 05/25/22.

## 2022-05-25 ENCOUNTER — Other Ambulatory Visit (HOSPITAL_COMMUNITY): Payer: Self-pay

## 2022-05-25 DIAGNOSIS — Z79899 Other long term (current) drug therapy: Secondary | ICD-10-CM | POA: Diagnosis not present

## 2022-05-26 LAB — COMPLETE METABOLIC PANEL WITH GFR
AG Ratio: 1.3 (calc) (ref 1.0–2.5)
ALT: 16 U/L (ref 6–29)
AST: 18 U/L (ref 10–35)
Albumin: 4.4 g/dL (ref 3.6–5.1)
Alkaline phosphatase (APISO): 80 U/L (ref 37–153)
BUN/Creatinine Ratio: 8 (calc) (ref 6–22)
BUN: 11 mg/dL (ref 7–25)
CO2: 23 mmol/L (ref 20–32)
Calcium: 9.3 mg/dL (ref 8.6–10.4)
Chloride: 106 mmol/L (ref 98–110)
Creat: 1.4 mg/dL — ABNORMAL HIGH (ref 0.50–1.05)
Globulin: 3.3 g/dL (calc) (ref 1.9–3.7)
Glucose, Bld: 105 mg/dL — ABNORMAL HIGH (ref 65–99)
Potassium: 4.2 mmol/L (ref 3.5–5.3)
Sodium: 138 mmol/L (ref 135–146)
Total Bilirubin: 0.5 mg/dL (ref 0.2–1.2)
Total Protein: 7.7 g/dL (ref 6.1–8.1)
eGFR: 41 mL/min/{1.73_m2} — ABNORMAL LOW (ref >=60)

## 2022-05-26 LAB — CBC WITH DIFFERENTIAL/PLATELET
Absolute Monocytes: 664 cells/uL (ref 200–950)
Basophils Absolute: 22 cells/uL (ref 0–200)
Basophils Relative: 0.4 %
Eosinophils Absolute: 59 cells/uL (ref 15–500)
Eosinophils Relative: 1.1 %
HCT: 40.9 % (ref 35.0–45.0)
Hemoglobin: 13.6 g/dL (ref 11.7–15.5)
Lymphs Abs: 2425 cells/uL (ref 850–3900)
MCH: 27.1 pg (ref 27.0–33.0)
MCHC: 33.3 g/dL (ref 32.0–36.0)
MCV: 81.5 fL (ref 80.0–100.0)
MPV: 11.2 fL (ref 7.5–12.5)
Monocytes Relative: 12.3 %
Neutro Abs: 2230 cells/uL (ref 1500–7800)
Neutrophils Relative %: 41.3 %
Platelets: 164 10*3/uL (ref 140–400)
RBC: 5.02 10*6/uL (ref 3.80–5.10)
RDW: 13.6 % (ref 11.0–15.0)
Total Lymphocyte: 44.9 %
WBC: 5.4 10*3/uL (ref 3.8–10.8)

## 2022-05-26 NOTE — Progress Notes (Signed)
CBC is normal.  Creatinine is elevated.  Patient should avoid all NSAIDs.  Please forward results to her PCP.

## 2022-05-27 ENCOUNTER — Other Ambulatory Visit (HOSPITAL_COMMUNITY): Payer: Self-pay

## 2022-06-02 ENCOUNTER — Other Ambulatory Visit (HOSPITAL_COMMUNITY): Payer: Self-pay

## 2022-06-11 ENCOUNTER — Other Ambulatory Visit (HOSPITAL_COMMUNITY): Payer: Self-pay

## 2022-06-30 ENCOUNTER — Other Ambulatory Visit (HOSPITAL_COMMUNITY): Payer: Self-pay

## 2022-07-01 ENCOUNTER — Other Ambulatory Visit (HOSPITAL_COMMUNITY): Payer: Self-pay

## 2022-07-03 ENCOUNTER — Other Ambulatory Visit (HOSPITAL_COMMUNITY): Payer: Self-pay

## 2022-07-07 ENCOUNTER — Other Ambulatory Visit: Payer: Self-pay | Admitting: Rheumatology

## 2022-07-07 ENCOUNTER — Other Ambulatory Visit (HOSPITAL_COMMUNITY): Payer: Self-pay

## 2022-07-07 DIAGNOSIS — L4050 Arthropathic psoriasis, unspecified: Secondary | ICD-10-CM

## 2022-07-07 MED ORDER — ENBREL MINI 50 MG/ML ~~LOC~~ SOCT
50.0000 mg | SUBCUTANEOUS | 0 refills | Status: DC
  Filled 2022-07-07: qty 4, 28d supply, fill #0
  Filled 2022-08-04 (×3): qty 4, 28d supply, fill #1
  Filled 2022-10-01: qty 4, 28d supply, fill #2

## 2022-07-07 NOTE — Telephone Encounter (Signed)
Next Visit: 08/10/2022  Last Visit: 03/10/2022  Last Fill: 03/25/2022  [id number] arthropathy   Current Dose per office note on 03/10/2022: Enbrel 50 mg subcutaneous injections once weekly.   Labs: 05/25/2022 CBC is normal.  Creatinine is elevated.  Patient should avoid all NSAIDs.   TB Gold: 02/25/2022 negative    Okay to refill enbrel?

## 2022-07-08 ENCOUNTER — Other Ambulatory Visit (HOSPITAL_COMMUNITY): Payer: Self-pay

## 2022-07-09 ENCOUNTER — Other Ambulatory Visit (HOSPITAL_COMMUNITY): Payer: Self-pay

## 2022-07-13 ENCOUNTER — Telehealth: Payer: Self-pay | Admitting: Internal Medicine

## 2022-07-13 NOTE — Telephone Encounter (Signed)
scheduled

## 2022-07-13 NOTE — Progress Notes (Shared)
Patient Care Team: Elby Showers, MD as PCP - General (Internal Medicine) Christin Fudge, MD (Inactive) as Consulting Physician (Surgery)  Visit Date: 07/14/22  Subjective:    Patient ID: Sarah Horton , Female   DOB: 08/22/1950, 72 y.o.    MRN: [id number]   71 y.o. Female presents today for painful bump on right leg. Patient has a past medical history of anemia, arthritis, cataract, IBS, migraines, obesity, psoriatic arthropathy, chronic bronchitis, hypothyroidism, venous insufficiency, GERD, allergic rhinitis.  Notes a painful bump on her right leg with associated heat, pain, itching in the area. History of venous ulcers 2017 near both ankles treated with vein ablation at [facility]. She has been wearing open-toe shoes. Denies any events that could've caused injury.   She has a history of varicose veins with complications bilaterally in January 2017.  Had stasis ulcer in 2016 of the lower extremities related to peripheral vascular disease.  Had venous stasis ulcer of the ankle in 2014.  Planning a trip to [place] in the near future.  She is concerned about this area on her right leg.   Past medical history: She has a history of psoriatic arthropathy and is on DMARD.  She has hypothyroidism treated with thyroid replacement medication.  History of osteoarthritis.  History of chronic kidney disease stage III followed by [facility].  Bilateral knee replacement 2011.  Stress fracture left foot in the remote past.  History of osteopenia.  Essential hypertension treated with clonidine.  History of migraine headaches and allergic rhinitis.  Underwent venous ablations in 2017.  Social history: She is married.  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   Past Medical History:  Diagnosis Date   Allergic rhinitis, cause unspecified    Allergy    Anemia, unspecified    Anxiety state, unspecified    Arthritis     Cataract    bilaterally removed 2003,2007   Esophageal reflux    Irritable bowel syndrome    Migraine    Obesity, unspecified    Psoriatic arthropathy (HCC)    Unspecified chronic bronchitis (HCC)    Unspecified hereditary and idiopathic peripheral neuropathy    Unspecified hypothyroidism    Unspecified venous (peripheral) insufficiency      Family History  Problem Relation Age of Onset   COPD Mother    Heart disease Mother    Hypertension Mother    Varicose Veins Mother    Cirrhosis Father    Thyroid disease Sister    Breast cancer Maternal Grandmother    Breast cancer Other        1st cousin    Pancreatic cancer Other        1st cousin    Lymphoma Cousin    Colon cancer Neg Hx    Colon polyps Neg Hx    Esophageal cancer Neg Hx    Rectal cancer Neg Hx    Stomach cancer Neg Hx     Social History   Social History Narrative   Not on file      Review of Systems  Constitutional:  Negative for fever and malaise/fatigue.  HENT:  Negative for congestion.   Eyes:  Negative for blurred vision.  Respiratory:  Negative for cough and shortness of breath.   Cardiovascular:  Negative for chest pain, palpitations and leg swelling.  Gastrointestinal:  Negative for vomiting.  Musculoskeletal:  Negative for back pain.  Skin:  Negative for rash.       (+)  Red painful, itchy bump on RLE.  Neurological:  Negative for loss of consciousness and headaches.        Objective:   Vitals: BP 136/84   Pulse 95   Temp 98.8 F (37.1 C) (Tympanic)   Ht 5\' 8"  (1.727 m)   Wt 233 lb 12.8 oz (106.1 kg)   SpO2 99%   BMI 35.55 kg/m    Physical Exam Vitals and nursing note reviewed.  Constitutional:      General: She is not in acute distress.    Appearance: Normal appearance. She is not toxic-appearing.  HENT:     Head: Normocephalic and atraumatic.  Cardiovascular:     Pulses:          Dorsalis pedis pulses are 2+ on the right side.  Pulmonary:     Effort: Pulmonary effort  is normal.  Skin:    General: Skin is warm and dry.     Comments: Superficial thrombophlebitis, 6 mm firm nodule, on medial aspect of RLE half-way down.  Neurological:     Mental Status: She is alert and oriented to person, place, and time. Mental status is at baseline.  Psychiatric:        Mood and Affect: Mood normal.        Behavior: Behavior normal.        Thought Content: Thought content normal.        Judgment: Judgment normal.       Results:   Studies obtained and personally reviewed by me:    Labs:       Component Value Date/Time   NA 138 05/25/2022 1528   NA 142 09/08/2016 1648   K 4.2 05/25/2022 1528   CL 106 05/25/2022 1528   CO2 23 05/25/2022 1528   GLUCOSE 105 (H) 05/25/2022 1528   GLUCOSE 90 04/16/2006 1125   BUN 11 05/25/2022 1528   BUN 14 09/08/2016 1648   CREATININE 1.40 (H) 05/25/2022 1528   CALCIUM 9.3 05/25/2022 1528   PROT 7.7 05/25/2022 1528   PROT 6.7 09/08/2016 1648   ALBUMIN 4.1 01/25/2017 1617   ALBUMIN 4.2 09/08/2016 1648   AST 18 05/25/2022 1528   ALT 16 05/25/2022 1528   ALKPHOS 87 01/25/2017 1617   BILITOT 0.5 05/25/2022 1528   BILITOT 0.3 09/08/2016 1648   GFRNONAA 61 11/29/2020 1155   GFRAA 71 11/29/2020 1155     Lab Results  Component Value Date   WBC 5.4 05/25/2022   HGB 13.6 05/25/2022   HCT 40.9 05/25/2022   MCV 81.5 05/25/2022   PLT 164 05/25/2022    Lab Results  Component Value Date   CHOL 175 09/30/2021   HDL 54 09/30/2021   LDLCALC 102 (H) 09/30/2021   TRIG 101 09/30/2021   CHOLHDL 3.2 09/30/2021    Lab Results  Component Value Date   HGBA1C 5.0 04/01/2021     Lab Results  Component Value Date   TSH 1.73 09/30/2021      Assessment & Plan:   Superficial Thrombophlebitis RLE: Advised to take ASA 81 mg daily and use warm moist compresses to area 2-3 times daily. Order doppler of LEs to rule out DVT. Follow up in 2 weeks  Essential hypertension  Chronic kidney disease stage IIIb  I, Elby Showers,  MD, have reviewed all documentation for this visit. The documentation on 07/14/22 for the exam, diagnosis, procedures, and orders are all accurate and complete.  I,Alexander Ruley,acting as a Education administrator for Elby Showers, MD.,have  documented all relevant documentation on the behalf of Elby Showers, MD,as directed by  Elby Showers, MD while in the presence of Dr. Renold Genta.

## 2022-07-13 NOTE — Telephone Encounter (Signed)
Sarah Horton [phone]  Sarah Smack called to say she has had a red spot on the inside of her right leg for about three weeks about a week ago it turned into a bump under the skin and is now hot to touch, she is concerned because she is getting ready to go on a long trip. She would like for you to look at it and advise, she does not want it to get infected.

## 2022-07-14 ENCOUNTER — Telehealth: Payer: Self-pay

## 2022-07-14 ENCOUNTER — Ambulatory Visit: Payer: BC Managed Care – PPO | Admitting: Internal Medicine

## 2022-07-14 ENCOUNTER — Encounter: Payer: Self-pay | Admitting: Internal Medicine

## 2022-07-14 VITALS — BP 136/84 | HR 95 | Temp 98.8°F | Ht 68.0 in | Wt 233.8 lb

## 2022-07-14 DIAGNOSIS — E063 Autoimmune thyroiditis: Secondary | ICD-10-CM

## 2022-07-14 DIAGNOSIS — I8001 Phlebitis and thrombophlebitis of superficial vessels of right lower extremity: Secondary | ICD-10-CM | POA: Diagnosis not present

## 2022-07-14 DIAGNOSIS — E038 Other specified hypothyroidism: Secondary | ICD-10-CM | POA: Diagnosis not present

## 2022-07-14 DIAGNOSIS — Z6836 Body mass index (BMI) 36.0-36.9, adult: Secondary | ICD-10-CM

## 2022-07-14 DIAGNOSIS — L4050 Arthropathic psoriasis, unspecified: Secondary | ICD-10-CM

## 2022-07-14 DIAGNOSIS — I10 Essential (primary) hypertension: Secondary | ICD-10-CM

## 2022-07-14 DIAGNOSIS — N1832 Chronic kidney disease, stage 3b: Secondary | ICD-10-CM

## 2022-07-14 NOTE — Telephone Encounter (Signed)
Sarah Horton with Dr. Verlene Mayer office called stating that the pt has a superficial clot in her R leg and is requesting an appt. The pt is leaving the country in 3 wks and is concerned.  Reviewed pt's chart, returned call for clarification, two identifiers used. Informed Sarah Horton that it was confirmed with the lab that this office would not treat a SVT, so the referral that was placed is incorrect. She transferred me to Dr. Renold Genta. Informed her as well. She asked if pt could be treated with NSAIDS since her leg is hot and red. Informed her that comfort measures would be appropriate, but it would be up to her judgement what to prescribe the pt with her POC. Informed her that if there was a concern of a DVT, she could certainly send a referral and order to our lab to be seen and have that r/o. She seemed irritated and stated that she would send the pt to [facility] instead.

## 2022-07-14 NOTE — Patient Instructions (Signed)
Advised to take aspirin 81 mg daily and use warm moist compresses to leg 20 minutes 2-3 times daily.  Will order Doppler of the lower extremities to rule out DVT.

## 2022-07-15 ENCOUNTER — Ambulatory Visit (HOSPITAL_COMMUNITY)
Admission: RE | Admit: 2022-07-15 | Discharge: 2022-07-15 | Disposition: A | Discharge status: Home / Self Care | Payer: BC Managed Care – PPO | Source: Ambulatory Visit | Attending: Internal Medicine | Admitting: Internal Medicine

## 2022-07-15 DIAGNOSIS — I8001 Phlebitis and thrombophlebitis of superficial vessels of right lower extremity: Secondary | ICD-10-CM | POA: Diagnosis not present

## 2022-07-15 DIAGNOSIS — Z6836 Body mass index (BMI) 36.0-36.9, adult: Secondary | ICD-10-CM

## 2022-07-15 NOTE — Progress Notes (Signed)
Lower extremity venous duplex has been completed.   Preliminary results in CV Proc.   Sarah Horton 07/15/2022 3:29 PM

## 2022-07-22 DIAGNOSIS — Z01419 Encounter for gynecological examination (general) (routine) without abnormal findings: Secondary | ICD-10-CM | POA: Diagnosis not present

## 2022-07-22 DIAGNOSIS — Z6836 Body mass index (BMI) 36.0-36.9, adult: Secondary | ICD-10-CM | POA: Diagnosis not present

## 2022-07-22 DIAGNOSIS — Z1231 Encounter for screening mammogram for malignant neoplasm of breast: Secondary | ICD-10-CM | POA: Diagnosis not present

## 2022-07-22 LAB — HM MAMMOGRAPHY

## 2022-07-23 ENCOUNTER — Encounter: Payer: Self-pay | Admitting: Internal Medicine

## 2022-07-27 NOTE — Progress Notes (Signed)
Office Visit Note  Patient: Sarah Horton             Date of Birth: 1950/10/03           MRN: [id number]             PCP: Elby Showers, MD Referring: Elby Showers, MD Visit Date: 08/10/2022 Occupation: '@GUAROCC'$ @  Subjective:  Left sided lower back pain   History of Present Illness: Sarah Horton is a 72 y.o. female with history of psoriatic arthritis.  Patient is currently taking enbrel 50 mg sq injections once weekly.  She continues to tolerate Enbrel without any side effects or injection site reactions.  She denies any signs or symptoms of a psoriatic arthritis flare recently.  She has not had any joint swelling.  Patient reports that she has been experiencing some increased discomfort in the left side of her lower back and feels that is coming from her SI joint.  She states that she has tried using heat provides temporary relief.  She has difficulty standing for prolonged periods of time.  She has been trying to walk and stretch on a regular basis but has had persistent discomfort.  She would like an updated XR of the SI joint today for further evaluation.  She denies any Achilles tendinitis or plantar fasciitis.  She has a few small patches of psoriasis currently. She denies any recent or recurrent infections.  She denies any new medical conditions.    Activities of Daily Living:  Patient reports morning stiffness for 10-15 minutes.   Patient Reports nocturnal pain.  Difficulty dressing/grooming: Denies Difficulty climbing stairs: Reports Difficulty getting out of chair: Reports Difficulty using hands for taps, buttons, cutlery, and/or writing: Denies  Review of Systems  Constitutional:  Positive for fatigue.  HENT:  Positive for mouth dryness. Negative for mouth sores.   Eyes: Negative.  Negative for dryness.  Respiratory: Negative.  Negative for shortness of breath.   Cardiovascular: Negative.  Negative for chest pain and palpitations.  Gastrointestinal:   Positive for constipation and diarrhea. Negative for blood in stool.  Endocrine: Negative.  Negative for increased urination.  Genitourinary: Negative.  Negative for involuntary urination.  Musculoskeletal:  Positive for joint pain, joint pain, myalgias, morning stiffness, muscle tenderness and myalgias. Negative for gait problem, joint swelling and muscle weakness.  Skin: Negative.  Negative for color change, rash, hair loss and sensitivity to sunlight.  Allergic/Immunologic: Negative.  Negative for susceptible to infections.  Neurological:  Positive for headaches. Negative for dizziness.  Hematological: Negative.  Negative for swollen glands.  Psychiatric/Behavioral: Negative.  Negative for depressed mood and sleep disturbance. The patient is not nervous/anxious.     PMFS History:  Patient Active Problem List   Diagnosis Date Noted   History of peripheral neuropathy 11/20/2016   Left wrist tendonitis/ history of surgery for Dequervains  11/20/2016   Raynaud's disease without gangrene 09/22/2016   High risk medication use 09/22/2016   History of ulcer of lower limb/ Venous stasis ulcer 01/2015  09/22/2016   Osteopenia of multiple sites 09/22/2016   Psoriasis 09/03/2016   Chest wall pain 07/16/2016   Flank pain, acute 05/15/2016   Subacromial bursitis of right shoulder joint 04/10/2016   Low back pain 04/09/2016   Pre-ulcerative calluses 02/11/2016   Varicose veins of right lower extremity with complications 123456   Elevated BP 08/09/2015   Varicose veins of left lower extremity with complications 123456   Varicose veins of bilateral lower  extremities with other complications [id number]   Varicose veins of lower extremities with ulcer ([place]) 04/05/2015   Status post bilateral knee replacements 12/13/2013   Venous stasis ulcer of ankle ([place]) 06/06/2013   ADD (attention deficit disorder) 04/04/2012   INSOMNIA 06/20/2010   Hypothyroidism 01/17/2010   Obesity 01/17/2010    Osteoarthritis 01/17/2010   ANEMIA 10/02/2009   SINUSITIS, ACUTE 10/02/2009   Asthma 10/02/2009   Venous (peripheral) insufficiency 03/11/2008   Allergic rhinitis 03/11/2008   BRONCHITIS, RECURRENT 03/11/2008   History of migraine 03/11/2008   Anxiety state 11/24/2007   Hereditary and idiopathic peripheral neuropathy 11/24/2007   GERD 11/24/2007   IRRITABLE BOWEL SYNDROME 11/24/2007   PSORIATIC ARTHROPATHY 11/24/2007    Past Medical History:  Diagnosis Date   Allergic rhinitis, cause unspecified    Allergy    Anemia, unspecified    Anxiety state, unspecified    Arthritis    Cataract    bilaterally removed 2003,2007   Esophageal reflux    Irritable bowel syndrome    Migraine    Obesity, unspecified    Psoriatic arthropathy ([place])    Unspecified chronic bronchitis ([place])    Unspecified hereditary and idiopathic peripheral neuropathy    Unspecified hypothyroidism    Unspecified venous (peripheral) insufficiency     Family History  Problem Relation Age of Onset   COPD Mother    Heart disease Mother    Hypertension Mother    Varicose Veins Mother    Cirrhosis Father    Thyroid disease Sister    Breast cancer Maternal Grandmother    Breast cancer Other        1st cousin    Pancreatic cancer Other        1st cousin    Lymphoma Cousin    Colon cancer Neg Hx    Colon polyps Neg Hx    Esophageal cancer Neg Hx    Rectal cancer Neg Hx    Stomach cancer Neg Hx    Past Surgical History:  Procedure Laterality Date   ABDOMINAL HYSTERECTOMY     ABDOMINAL HYSTERECTOMY     ABLATION ON ENDOMETRIOSIS     x2   Bilat TKRs  04/2010   by [name]   BUNIONECTOMY  2009   CATARACT EXTRACTION, BILATERAL     2003,2007   COLONOSCOPY  08-26-2004   with gessner tics and hems    dequervains tensenovitis Left    JOINT REPLACEMENT Bilateral    knees   NASAL SINUS SURGERY     SHOULDER SURGERY  2005   TOTAL SHOULDER ARTHROPLASTY     Social History   Social History Narrative   Not  on file   Immunization History  Administered Date(s) Administered   Fluad Quad(high Dose 65+) 05/16/2022   H1N1 05/18/2008   Influenza Inj Mdck Quad Pf 04/01/2017   Influenza Inj Mdck Quad With Preservative 04/13/2018   Influenza Split 03/23/2011, 03/14/2012, 03/23/2013, 03/22/2016   Influenza Whole 03/08/2008, 03/08/2009, 03/07/2010   Influenza,inj,Quad PF,6+ Mos 03/21/2014, 03/31/2019, 04/03/2021   PFIZER(Purple Top)SARS-COV-2 Vaccination 08/22/2019, 09/12/2019   Pneumococcal Conjugate-13 06/28/2019   Td 01/06/2002   Tdap 03/14/2012   Zoster Recombinat (Shingrix) 03/08/2018     Objective: Vital Signs: BP (!) 172/91 (BP Location: Left Arm, Patient Position: Sitting, Cuff Size: Normal)   Pulse 91   Resp 16   Ht '5\' 8"'$  (1.727 m)   Wt 233 lb (105.7 kg)   BMI 35.43 kg/m    Physical Exam Vitals and nursing note reviewed.  Constitutional:      Appearance: She is well-developed.  HENT:     Head: Normocephalic and atraumatic.  Eyes:     Conjunctiva/sclera: Conjunctivae normal.  Cardiovascular:     Rate and Rhythm: Normal rate and regular rhythm.     Heart sounds: Normal heart sounds.  Pulmonary:     Effort: Pulmonary effort is normal.     Breath sounds: Normal breath sounds.  Abdominal:     General: Bowel sounds are normal.     Palpations: Abdomen is soft.  Musculoskeletal:     Cervical back: Normal range of motion.  Lymphadenopathy:     Cervical: No cervical adenopathy.  Skin:    General: Skin is warm and dry.     Capillary Refill: Capillary refill takes less than 2 seconds.  Neurological:     Mental Status: She is alert and oriented to person, place, and time.  Psychiatric:        Behavior: Behavior normal.      Musculoskeletal Exam: C-spine, thoracic spine, and lumbar spine good ROM.  No midline spinal tenderness.  Tenderness of the left SI joint.  Shoulder joints, elbow joints, wrist joints, MCPs, PIPs, DIPs have good range of motion with no synovitis.  PIP and  DIP thickening consistent with osteoarthritis of both hands.  Complete fist formation noted bilaterally.  No dactylitis noted.  Hip joints have good range of motion with no groin pain.  Knee joints have good range of motion with no warmth or effusion.  Ankle joints have good range of motion with no tenderness or joint swelling. No evidence of achilles tendonitis or plantar fasciitis.    CDAI Exam: CDAI Score: -- Patient Global: --; Provider Global: -- Swollen: --; Tender: -- Joint Exam 08/10/2022   No joint exam has been documented for this visit   There is currently no information documented on the homunculus. Go to the Rheumatology activity and complete the homunculus joint exam.  Investigation: No additional findings.  Imaging: VAS [place] LOWER EXTREMITY VENOUS (DVT)  Result Date: 07/15/2022  Lower Venous DVT Study Patient Name:  TIYONNA DUVERNAY  Date of Exam:   07/15/2022 Medical Rec #: [id number]              Accession #:    [id number] Date of Birth: Dec 05, 1950             Patient Gender: F Patient Age:   71 years Exam Location:  [facility] Procedure:      VAS [place] LOWER EXTREMITY VENOUS (DVT) Referring Phys: Tedra [place] --------------------------------------------------------------------------------  Indications: Swelling.  Comparison Study: No prior Performing Technologist: Archie Patten RVS  Examination Guidelines: A complete evaluation includes B-mode imaging, spectral Doppler, color Doppler, and power Doppler as needed of all accessible portions of each vessel. Bilateral testing is considered an integral part of a complete examination. Limited examinations for reoccurring indications may be performed as noted. The reflux portion of the exam is performed with the patient in reverse Trendelenburg.  +---------+---------------+---------+-----------+----------+--------------+ RIGHT    CompressibilityPhasicitySpontaneityPropertiesThrombus Aging  +---------+---------------+---------+-----------+----------+--------------+ CFV      Full           Yes      Yes                                 +---------+---------------+---------+-----------+----------+--------------+ SFJ      Full                                                        +---------+---------------+---------+-----------+----------+--------------+  FV Prox  Full                                                        +---------+---------------+---------+-----------+----------+--------------+ FV Mid   Full                                                        +---------+---------------+---------+-----------+----------+--------------+ FV DistalFull                                                        +---------+---------------+---------+-----------+----------+--------------+ PFV      Full                                                        +---------+---------------+---------+-----------+----------+--------------+ POP      Full           Yes      Yes                                 +---------+---------------+---------+-----------+----------+--------------+ PTV      Full                                                        +---------+---------------+---------+-----------+----------+--------------+ PERO     Full                                                        +---------+---------------+---------+-----------+----------+--------------+   +----+---------------+---------+-----------+----------+--------------+ LEFTCompressibilityPhasicitySpontaneityPropertiesThrombus Aging +----+---------------+---------+-----------+----------+--------------+ CFV Full           Yes      Yes                                 +----+---------------+---------+-----------+----------+--------------+     Summary: RIGHT: - There is no evidence of deep vein thrombosis in the lower extremity.  - No cystic structure found in the popliteal fossa.   LEFT: - No evidence of common femoral vein obstruction.  *See table(s) above for measurements and observations. Electronically signed by Harold Barban MD on 07/15/2022 at 10:03:47 PM.    Final     Recent Labs: Lab Results  Component Value Date   WBC 5.4 05/25/2022   HGB 13.6 05/25/2022   PLT 164 05/25/2022   NA 138 05/25/2022   K 4.2 05/25/2022   CL 106 05/25/2022   CO2 23 05/25/2022   GLUCOSE 105 (H) 05/25/2022  BUN 11 05/25/2022   CREATININE 1.40 (H) 05/25/2022   BILITOT 0.5 05/25/2022   ALKPHOS 87 01/25/2017   AST 18 05/25/2022   ALT 16 05/25/2022   PROT 7.7 05/25/2022   ALBUMIN 4.1 01/25/2017   CALCIUM 9.3 05/25/2022   GFRAA 71 11/29/2020   QFTBGOLDPLUS NEGATIVE 02/25/2022    Speciality Comments: Prior therapy: Methotrexate (elevated creatinine),Arava-pt dcd  Procedures:  No procedures performed Allergies: Nucynta [tapentadol], Codeine, Penicillins, Tessalon [benzonatate], and Other    Assessment / Plan:     Visit Diagnoses: Psoriatic arthropathy ([place]): She has no synovitis or dactylitis on examination today.  She has not had any signs or symptoms of a psoriatic arthritis flare.  Overall she has clinically been doing well on Enbrel 50 mg subcutaneous injections once weekly.  She continues to tolerate Enbrel without any side effects or injection site reactions.  She has not any recent or recurrent infections.  She presents today with some increased discomfort in the left sacroiliac joint.  She has some tenderness over the left SI joint today.  X-rays of the pelvis were updated today for further evaluation as requested.  She is advised to notify us if she would like referral to physical therapy or if she would like to return for a left SI joint cortisone injection in the future.  She has no evidence of Achilles tendinitis or plantar fasciitis at this time.  No signs or symptoms of uveitis.  She will remain on Enbrel as prescribed.  She was advised to notify us if she develops signs  or symptoms of a flare.  She will follow up in 5 months or sooner if needed.   Psoriasis: She has a few small scattered patches of psoriasis.  She has a prescription topical agents she has been using as needed.  She does not require refill at this time.  She will remain on Enbrel as prescribed.  She is advised to notify us if she develops signs or symptoms of a flare.  High risk medication use - Enbrel 50 mg subcutaneous injections once weekly.  CBC and CMP updated on 05/25/22. Orders for CBC and CMP released today.  Her next lab work will be  due in June and every 3 months.  TB gold negative on 02/25/22.  No recent or recurrent infections. Discussed the importance of holding enbrel if she develops signs or symptoms of an infection and to resume once the infection has completely cleared.  - Plan: CBC with Differential/Platelet, COMPLETE METABOLIC PANEL WITH GFR  Left wrist tendonitis/ history of surgery for Dequervains: Resolved.   Trochanteric bursitis, left hip: Not currently asymptomatic.  She has been performing stretching exercises daily.   Status post bilateral knee replacements: Doing well.  Good ROM with no discomfort.  No warmth or effusion noted.   Sacroiliitis ([place]) -She presents today with increased pain and stiffness in the left SI joint. She has tenderness palpation over the left SI joint.  No midline spinal tenderness noted.  She has difficulty standing for prolonged periods of time.  She has not been experiencing any nocturnal pain.  She has tried using heat which writes temporary relief.  X-rays of the pelvis were obtained today.   If her symptoms persist or worsen she can return for a left SI joint cortisone injection or we can refer her to physical therapy.  Plan: XR Pelvis 1-2 Views  Raynaud's disease without gangrene: Not currently active.  No digital ulcerations or signs of gangrene.   Osteopenia of  multiple sites - DEXA updated on 08/01/2021: Right femoral neck BMD 0.598  with T score of -2.3.  Use of calcium rich diet and vitamin D was discussed.  Due to update DEXA in February 2025.   Other medical conditions are listed as follows:   History of hypertension: BP was rechecked prior to the patient leaving today.   History of ulcer of lower limb/ Venous stasis ulcer 01/2015   History of gastroesophageal reflux (GERD)  History of hypothyroidism  History of migraine  History of asthma  History of IBS  Primary insomnia  Hereditary and idiopathic peripheral neuropathy  Orders: Orders Placed This Encounter  Procedures   XR Pelvis 1-2 Views   CBC with Differential/Platelet   COMPLETE METABOLIC PANEL WITH GFR   Meds ordered this encounter  Medications   methocarbamol (ROBAXIN) 500 MG tablet    Sig: Take 1 tablet (500 mg total) by mouth 2 (two) times daily as needed.    Dispense:  60 tablet    Refill:  0     Follow-Up Instructions: Return in about 5 months (around 01/10/2023) for Psoriatic arthritis.   Ofilia Neas, PA-C  Note - This record has been created using Dragon software.  Chart creation errors have been sought, but may not always  have been located. Such creation errors do not reflect on  the standard of medical care.

## 2022-07-30 ENCOUNTER — Other Ambulatory Visit (HOSPITAL_COMMUNITY): Payer: Self-pay

## 2022-07-30 ENCOUNTER — Other Ambulatory Visit: Payer: Self-pay

## 2022-07-30 DIAGNOSIS — F902 Attention-deficit hyperactivity disorder, combined type: Secondary | ICD-10-CM | POA: Diagnosis not present

## 2022-07-30 DIAGNOSIS — F331 Major depressive disorder, recurrent, moderate: Secondary | ICD-10-CM | POA: Diagnosis not present

## 2022-07-30 DIAGNOSIS — F411 Generalized anxiety disorder: Secondary | ICD-10-CM | POA: Diagnosis not present

## 2022-07-30 DIAGNOSIS — F422 Mixed obsessional thoughts and acts: Secondary | ICD-10-CM | POA: Diagnosis not present

## 2022-07-30 MED ORDER — AMPHETAMINE-DEXTROAMPHET ER 30 MG PO CP24
30.0000 mg | ORAL_CAPSULE | Freq: Two times a day (BID) | ORAL | 0 refills | Status: DC
  Filled 2022-07-30: qty 40, 20d supply, fill #0
  Filled 2022-08-13: qty 60, 30d supply, fill #0
  Filled 2022-08-14: qty 50, 25d supply, fill #0

## 2022-07-30 MED ORDER — CLONIDINE HCL 0.1 MG PO TABS
0.1000 mg | ORAL_TABLET | Freq: Every day | ORAL | 2 refills | Status: DC
  Filled 2022-07-30: qty 90, 90d supply, fill #0
  Filled 2022-11-01: qty 90, 90d supply, fill #1
  Filled 2023-01-28: qty 90, 90d supply, fill #2

## 2022-08-03 ENCOUNTER — Other Ambulatory Visit: Payer: Self-pay

## 2022-08-04 ENCOUNTER — Other Ambulatory Visit (HOSPITAL_COMMUNITY): Payer: Self-pay

## 2022-08-05 ENCOUNTER — Other Ambulatory Visit (HOSPITAL_COMMUNITY): Payer: Self-pay

## 2022-08-05 ENCOUNTER — Other Ambulatory Visit: Payer: Self-pay

## 2022-08-10 ENCOUNTER — Ambulatory Visit (INDEPENDENT_AMBULATORY_CARE_PROVIDER_SITE_OTHER): Payer: BC Managed Care – PPO

## 2022-08-10 ENCOUNTER — Other Ambulatory Visit: Payer: Self-pay

## 2022-08-10 ENCOUNTER — Encounter: Payer: Self-pay | Admitting: Physician Assistant

## 2022-08-10 ENCOUNTER — Other Ambulatory Visit (HOSPITAL_COMMUNITY): Payer: Self-pay

## 2022-08-10 ENCOUNTER — Ambulatory Visit: Payer: BC Managed Care – PPO | Attending: Physician Assistant | Admitting: Physician Assistant

## 2022-08-10 VITALS — BP 172/91 | HR 91 | Resp 16 | Ht 68.0 in | Wt 233.0 lb

## 2022-08-10 DIAGNOSIS — M461 Sacroiliitis, not elsewhere classified: Secondary | ICD-10-CM

## 2022-08-10 DIAGNOSIS — Z79899 Other long term (current) drug therapy: Secondary | ICD-10-CM | POA: Diagnosis not present

## 2022-08-10 DIAGNOSIS — G609 Hereditary and idiopathic neuropathy, unspecified: Secondary | ICD-10-CM

## 2022-08-10 DIAGNOSIS — I7300 Raynaud's syndrome without gangrene: Secondary | ICD-10-CM

## 2022-08-10 DIAGNOSIS — M778 Other enthesopathies, not elsewhere classified: Secondary | ICD-10-CM | POA: Diagnosis not present

## 2022-08-10 DIAGNOSIS — Z8639 Personal history of other endocrine, nutritional and metabolic disease: Secondary | ICD-10-CM

## 2022-08-10 DIAGNOSIS — Z872 Personal history of diseases of the skin and subcutaneous tissue: Secondary | ICD-10-CM

## 2022-08-10 DIAGNOSIS — L409 Psoriasis, unspecified: Secondary | ICD-10-CM | POA: Diagnosis not present

## 2022-08-10 DIAGNOSIS — M7062 Trochanteric bursitis, left hip: Secondary | ICD-10-CM

## 2022-08-10 DIAGNOSIS — Z96653 Presence of artificial knee joint, bilateral: Secondary | ICD-10-CM

## 2022-08-10 DIAGNOSIS — Z8719 Personal history of other diseases of the digestive system: Secondary | ICD-10-CM

## 2022-08-10 DIAGNOSIS — M8589 Other specified disorders of bone density and structure, multiple sites: Secondary | ICD-10-CM

## 2022-08-10 DIAGNOSIS — Z8709 Personal history of other diseases of the respiratory system: Secondary | ICD-10-CM

## 2022-08-10 DIAGNOSIS — F5101 Primary insomnia: Secondary | ICD-10-CM

## 2022-08-10 DIAGNOSIS — Z8679 Personal history of other diseases of the circulatory system: Secondary | ICD-10-CM

## 2022-08-10 DIAGNOSIS — Z8669 Personal history of other diseases of the nervous system and sense organs: Secondary | ICD-10-CM

## 2022-08-10 DIAGNOSIS — L4050 Arthropathic psoriasis, unspecified: Secondary | ICD-10-CM | POA: Diagnosis not present

## 2022-08-10 MED ORDER — METHOCARBAMOL 500 MG PO TABS: 500.0000 mg | ORAL_TABLET | Freq: Two times a day (BID) | ORAL | 0 refills | Status: DC | PRN

## 2022-08-10 NOTE — Patient Instructions (Signed)
Standing Labs We placed an order today for your standing lab work.   Please have your standing labs drawn in June and every 3 months   Please have your labs drawn 2 weeks prior to your appointment so that the provider can discuss your lab results at your appointment, if possible.  Please note that you may see your imaging and lab results in [place] before we have reviewed them. We will contact you once all results are reviewed. Please allow our office up to 72 hours to thoroughly review all of the results before contacting the office for clarification of your results.  WALK-IN LAB HOURS  Monday through Thursday from 8:00 am -12:30 pm and 1:00 pm-5:00 pm and Friday from 8:00 am-12:00 pm.  Patients with office visits requiring labs will be seen before walk-in labs.  You may encounter longer than normal wait times. Please allow additional time. Wait times may be shorter on  Monday and Thursday afternoons.  We do not book appointments for walk-in labs. We appreciate your patience and understanding with our staff.   Labs are drawn by [facility]. Please bring your co-pay at the time of your lab draw.  You may receive a bill from [facility] for your lab work.  Please note if you are on Hydroxychloroquine and and an order has been placed for a Hydroxychloroquine level,  you will need to have it drawn 4 hours or more after your last dose.  If you wish to have your labs drawn at another location, please call the office 24 hours in advance so we can fax the orders.  The office is located at [address], [place], [place], [place] [address]   If you have any questions regarding directions or hours of operation,  please call [phone].   As a reminder, please drink plenty of water prior to coming for your lab work. Thanks!

## 2022-08-10 NOTE — Progress Notes (Signed)
Medication Samples have been provided to the patient.  Drug name: Enbrel Sureclick       Strength: 50 mg        Qty: 1 [id number]  Exp.Date: Nov. 2025  Dosing instructions: Inject 50 mg into skin once weekly.

## 2022-08-11 ENCOUNTER — Other Ambulatory Visit: Payer: Self-pay

## 2022-08-11 ENCOUNTER — Other Ambulatory Visit (HOSPITAL_COMMUNITY): Payer: Self-pay

## 2022-08-11 ENCOUNTER — Encounter: Payer: Self-pay | Admitting: *Deleted

## 2022-08-11 LAB — COMPLETE METABOLIC PANEL WITH GFR
AG Ratio: 1.5 (calc) (ref 1.0–2.5)
ALT: 17 U/L (ref 6–29)
AST: 21 U/L (ref 10–35)
Albumin: 4.1 g/dL (ref 3.6–5.1)
Alkaline phosphatase (APISO): 85 U/L (ref 37–153)
BUN: 14 mg/dL (ref 7–25)
CO2: 24 mmol/L (ref 20–32)
Calcium: 8.7 mg/dL (ref 8.6–10.4)
Chloride: 107 mmol/L (ref 98–110)
Creat: 0.99 mg/dL (ref 0.50–1.05)
Globulin: 2.7 g/dL (calc) (ref 1.9–3.7)
Glucose, Bld: 90 mg/dL (ref 65–99)
Potassium: 4.5 mmol/L (ref 3.5–5.3)
Sodium: 140 mmol/L (ref 135–146)
Total Bilirubin: 0.4 mg/dL (ref 0.2–1.2)
Total Protein: 6.8 g/dL (ref 6.1–8.1)
eGFR: 62 mL/min/{1.73_m2} (ref >=60)

## 2022-08-11 LAB — CBC WITH DIFFERENTIAL/PLATELET
Absolute Monocytes: 532 cells/uL (ref 200–950)
Basophils Absolute: 20 cells/uL (ref 0–200)
Basophils Relative: 0.5 %
Eosinophils Absolute: 52 cells/uL (ref 15–500)
Eosinophils Relative: 1.3 %
HCT: 38 % (ref 35.0–45.0)
Hemoglobin: 12.7 g/dL (ref 11.7–15.5)
Lymphs Abs: 2380 cells/uL (ref 850–3900)
MCH: 27 pg (ref 27.0–33.0)
MCHC: 33.4 g/dL (ref 32.0–36.0)
MCV: 80.7 fL (ref 80.0–100.0)
MPV: 10.7 fL (ref 7.5–12.5)
Monocytes Relative: 13.3 %
Neutro Abs: 1016 cells/uL — ABNORMAL LOW (ref 1500–7800)
Neutrophils Relative %: 25.4 %
Platelets: 137 10*3/uL — ABNORMAL LOW (ref 140–400)
RBC: 4.71 10*6/uL (ref 3.80–5.10)
RDW: 13.6 % (ref 11.0–15.0)
Total Lymphocyte: 59.5 %
WBC: 4 10*3/uL (ref 3.8–10.8)

## 2022-08-11 NOTE — Progress Notes (Signed)
X-rays of the pelvis are unremarkable. Please notify the patient.

## 2022-08-11 NOTE — Progress Notes (Signed)
CMP WNL.  Plt count is borderline low-137K.  Absolute neutrophils are borderline low. Rest of CBC WNL. Recommend rechecking CBC with diff in 1 month.

## 2022-08-13 ENCOUNTER — Other Ambulatory Visit (HOSPITAL_COMMUNITY): Payer: Self-pay

## 2022-08-13 ENCOUNTER — Other Ambulatory Visit: Payer: Self-pay

## 2022-08-14 ENCOUNTER — Other Ambulatory Visit: Payer: Self-pay

## 2022-08-14 ENCOUNTER — Other Ambulatory Visit (HOSPITAL_COMMUNITY): Payer: Self-pay

## 2022-08-27 ENCOUNTER — Other Ambulatory Visit (HOSPITAL_COMMUNITY): Payer: Self-pay

## 2022-08-29 ENCOUNTER — Other Ambulatory Visit: Payer: Self-pay | Admitting: Internal Medicine

## 2022-08-31 ENCOUNTER — Other Ambulatory Visit (HOSPITAL_COMMUNITY): Payer: Self-pay

## 2022-09-02 ENCOUNTER — Other Ambulatory Visit (HOSPITAL_COMMUNITY): Payer: Self-pay

## 2022-09-23 ENCOUNTER — Other Ambulatory Visit (HOSPITAL_COMMUNITY): Payer: Self-pay

## 2022-09-23 ENCOUNTER — Other Ambulatory Visit: Payer: Self-pay

## 2022-09-24 ENCOUNTER — Other Ambulatory Visit: Payer: Self-pay

## 2022-09-24 ENCOUNTER — Other Ambulatory Visit (HOSPITAL_COMMUNITY): Payer: Self-pay

## 2022-09-24 MED ORDER — AMPHETAMINE-DEXTROAMPHET ER 30 MG PO CP24
30.0000 mg | ORAL_CAPSULE | Freq: Two times a day (BID) | ORAL | 0 refills | Status: DC
  Filled 2022-09-24 – 2022-10-01 (×2): qty 60, 30d supply, fill #0

## 2022-09-29 ENCOUNTER — Other Ambulatory Visit (HOSPITAL_COMMUNITY): Payer: Self-pay

## 2022-09-30 ENCOUNTER — Other Ambulatory Visit: Payer: Self-pay

## 2022-10-01 ENCOUNTER — Other Ambulatory Visit (HOSPITAL_BASED_OUTPATIENT_CLINIC_OR_DEPARTMENT_OTHER): Payer: Self-pay

## 2022-10-01 ENCOUNTER — Other Ambulatory Visit (HOSPITAL_COMMUNITY): Payer: Self-pay

## 2022-10-01 ENCOUNTER — Other Ambulatory Visit: Payer: Self-pay

## 2022-10-01 ENCOUNTER — Telehealth: Payer: Self-pay | Admitting: Internal Medicine

## 2022-10-01 NOTE — Telephone Encounter (Signed)
Sarah Horton [phone]  [name] called to say she has been hoarse, Cough, Greenish, brown mucus, O2 95%-98%, wheezing 3-4 weeks, not getting any better with self medicating. Schedule to come in tomorrow, the earliest she could come in, offered this afternoon.

## 2022-10-01 NOTE — Telephone Encounter (Signed)
scheduled

## 2022-10-02 ENCOUNTER — Ambulatory Visit
Admission: RE | Admit: 2022-10-02 | Discharge: 2022-10-02 | Disposition: A | Discharge status: Home / Self Care | Payer: BC Managed Care – PPO | Source: Ambulatory Visit | Attending: Internal Medicine | Admitting: Internal Medicine

## 2022-10-02 ENCOUNTER — Encounter: Payer: Self-pay | Admitting: Internal Medicine

## 2022-10-02 ENCOUNTER — Ambulatory Visit: Payer: BC Managed Care – PPO | Admitting: Internal Medicine

## 2022-10-02 ENCOUNTER — Other Ambulatory Visit (HOSPITAL_COMMUNITY): Payer: Self-pay

## 2022-10-02 VITALS — BP 124/80 | HR 106 | Temp 98.0°F | Ht 68.0 in | Wt 233.0 lb

## 2022-10-02 DIAGNOSIS — J301 Allergic rhinitis due to pollen: Secondary | ICD-10-CM

## 2022-10-02 DIAGNOSIS — E038 Other specified hypothyroidism: Secondary | ICD-10-CM

## 2022-10-02 DIAGNOSIS — I10 Essential (primary) hypertension: Secondary | ICD-10-CM

## 2022-10-02 DIAGNOSIS — R059 Cough, unspecified: Secondary | ICD-10-CM | POA: Diagnosis not present

## 2022-10-02 DIAGNOSIS — N183 Chronic kidney disease, stage 3 unspecified: Secondary | ICD-10-CM

## 2022-10-02 DIAGNOSIS — R0602 Shortness of breath: Secondary | ICD-10-CM | POA: Diagnosis not present

## 2022-10-02 DIAGNOSIS — E063 Autoimmune thyroiditis: Secondary | ICD-10-CM

## 2022-10-02 DIAGNOSIS — J040 Acute laryngitis: Secondary | ICD-10-CM

## 2022-10-02 DIAGNOSIS — L4050 Arthropathic psoriasis, unspecified: Secondary | ICD-10-CM

## 2022-10-02 DIAGNOSIS — Z96653 Presence of artificial knee joint, bilateral: Secondary | ICD-10-CM

## 2022-10-02 DIAGNOSIS — J22 Unspecified acute lower respiratory infection: Secondary | ICD-10-CM

## 2022-10-02 DIAGNOSIS — R0989 Other specified symptoms and signs involving the circulatory and respiratory systems: Secondary | ICD-10-CM | POA: Diagnosis not present

## 2022-10-02 LAB — CBC WITH DIFFERENTIAL/PLATELET
Absolute Monocytes: 590 cells/uL (ref 200–950)
Basophils Absolute: 38 cells/uL (ref 0–200)
Basophils Relative: 0.8 %
Eosinophils Absolute: 82 cells/uL (ref 15–500)
Eosinophils Relative: 1.7 %
HCT: 35 % (ref 35.0–45.0)
Hemoglobin: 11.6 g/dL — ABNORMAL LOW (ref 11.7–15.5)
Lymphs Abs: 2573 cells/uL (ref 850–3900)
MCH: 26.5 pg — ABNORMAL LOW (ref 27.0–33.0)
MCHC: 33.1 g/dL (ref 32.0–36.0)
MCV: 79.9 fL — ABNORMAL LOW (ref 80.0–100.0)
MPV: 11.4 fL (ref 7.5–12.5)
Monocytes Relative: 12.3 %
Neutro Abs: 1517 cells/uL (ref 1500–7800)
Neutrophils Relative %: 31.6 %
Platelets: 177 10*3/uL (ref 140–400)
RBC: 4.38 10*6/uL (ref 3.80–5.10)
RDW: 14.7 % (ref 11.0–15.0)
Total Lymphocyte: 53.6 %
WBC: 4.8 10*3/uL (ref 3.8–10.8)

## 2022-10-02 MED ORDER — CEFTRIAXONE SODIUM 1 G IJ SOLR
1.0000 g | Freq: Once | INTRAMUSCULAR | Status: AC
  Administered 2022-10-02: 1 g via INTRAMUSCULAR

## 2022-10-02 MED ORDER — CEFTRIAXONE SODIUM 1 G IJ SOLR: 1.0000 g | Freq: Once | INTRAMUSCULAR | Status: DC

## 2022-10-02 MED ORDER — AZITHROMYCIN 250 MG PO TABS
ORAL_TABLET | ORAL | 0 refills | Status: AC
  Filled 2022-10-02: qty 6, 5d supply, fill #0

## 2022-10-02 NOTE — Progress Notes (Signed)
Patient Care Team: Margaree Mackintosh, MD as PCP - General (Internal Medicine) Evlyn Kanner, MD (Inactive) as Consulting Physician (Surgery)  Visit Date: 10/02/22  Subjective:    Patient ID: Sarah Horton , Female   DOB: 1950/10/02, 72 y.o.    MRN: [id number]   72 y.o. Female presents today for cough with green/brown sputum, wheezing since 09/04/22. She is feeling better today. Voice has been hoarse for three weeks. Symptoms started with cough and then sinus pressure. Has been using Astepro, albuterol inhaler. Denies fever, shaking chills.  Last acute acute lower respiratory infection Sept 2023.   Hx psoriatic arthropathy treated with DMARD.  Hx essential HTN, hypothyroidism, CKD, allegic rhinitis, osteopenia, Hx bilateral knee arthroplasties, and  Migraine headaches.   Past Medical History:  Diagnosis Date   Allergic rhinitis, cause unspecified    Allergy    Anemia, unspecified    Anxiety state, unspecified    Arthritis    Cataract    bilaterally removed 2003,2007   Esophageal reflux    Irritable bowel syndrome    Migraine    Obesity, unspecified    Psoriatic arthropathy (HCC)    Unspecified chronic bronchitis (HCC)    Unspecified hereditary and idiopathic peripheral neuropathy    Unspecified hypothyroidism    Unspecified venous (peripheral) insufficiency      Family History  Problem Relation Age of Onset   COPD Mother    Heart disease Mother    Hypertension Mother    Varicose Veins Mother    Cirrhosis Father    Thyroid disease Sister    Breast cancer Maternal Grandmother    Breast cancer Other        1st cousin    Pancreatic cancer Other        1st cousin    Lymphoma Cousin    Colon cancer Neg Hx    Colon polyps Neg Hx    Esophageal cancer Neg Hx    Rectal cancer Neg Hx    Stomach cancer Neg Hx          Review of Systems  Constitutional:  Negative for chills, fever and malaise/fatigue.  HENT:  Negative for congestion.   Eyes:  Negative  for blurred vision.  Respiratory:  Positive for cough, sputum production (Green) and wheezing. Negative for shortness of breath.   Cardiovascular:  Negative for chest pain, palpitations and leg swelling.  Gastrointestinal:  Negative for vomiting.  Musculoskeletal:  Negative for back pain.  Skin:  Negative for rash.  Neurological:  Negative for loss of consciousness and headaches.        Objective:   Vitals: BP 124/80   Pulse (!) 106   Temp 98 F (36.7 C) (Tympanic)   Ht 5\' 8"  (1.727 m)   Wt 233 lb (105.7 kg)   SpO2 97%   BMI 35.43 kg/m    Physical Exam Vitals and nursing note reviewed.  Constitutional:      General: She is not in acute distress.    Appearance: Normal appearance. She is not toxic-appearing.  HENT:     Head: Normocephalic and atraumatic.     Right Ear: Hearing, ear canal and external ear normal.     Left Ear: Hearing, ear canal and external ear normal.     Ears:     Comments: Left and right TM slightly full.    Mouth/Throat:     Comments: Pharynx slightly injected. Pulmonary:     Effort: Pulmonary effort is normal.  Comments: Rales right lower lobe. Skin:    General: Skin is warm and dry.  Neurological:     Mental Status: She is alert and oriented to person, place, and time. Mental status is at baseline.  Psychiatric:        Mood and Affect: Mood normal.        Behavior: Behavior normal.        Thought Content: Thought content normal.        Judgment: Judgment normal.       Results:   Studies obtained and personally reviewed by me:    Labs:       Component Value Date/Time   NA 140 08/10/2022 1330   NA 142 09/08/2016 1648   K 4.5 08/10/2022 1330   CL 107 08/10/2022 1330   CO2 24 08/10/2022 1330   GLUCOSE 90 08/10/2022 1330   GLUCOSE 90 04/16/2006 1125   BUN 14 08/10/2022 1330   BUN 14 09/08/2016 1648   CREATININE 0.99 08/10/2022 1330   CALCIUM 8.7 08/10/2022 1330   PROT 6.8 08/10/2022 1330   PROT 6.7 09/08/2016 1648    ALBUMIN 4.1 01/25/2017 1617   ALBUMIN 4.2 09/08/2016 1648   AST 21 08/10/2022 1330   ALT 17 08/10/2022 1330   ALKPHOS 87 01/25/2017 1617   BILITOT 0.4 08/10/2022 1330   BILITOT 0.3 09/08/2016 1648   GFRNONAA 61 11/29/2020 1155   GFRAA 71 11/29/2020 1155     Lab Results  Component Value Date   WBC 4.0 08/10/2022   HGB 12.7 08/10/2022   HCT 38.0 08/10/2022   MCV 80.7 08/10/2022   PLT 137 (L) 08/10/2022    Lab Results  Component Value Date   CHOL 175 09/30/2021   HDL 54 09/30/2021   LDLCALC 102 (H) 09/30/2021   TRIG 101 09/30/2021   CHOLHDL 3.2 09/30/2021    Lab Results  Component Value Date   HGBA1C 5.0 04/01/2021     Lab Results  Component Value Date   TSH 1.73 09/30/2021      Assessment & Plan:   Acute lower respiratory infection: prescribed Azithromycin Z-Pak two tabs day 1 followed by one tab days 2-5. Administered Rocephin 1 g IM. Ordered CBC with Diff/Plat. CXR reviewed. Despite rales on exam, no infiltrate identified.   10/02/22 CXR was negative for acute cardiopulmonary disease. Despite having rales, CXR does not show infiltrate.  Enbrel therapy for psoriatic arthritis  Allergic rhinitis  Stage 3 CKD  Hypothyroidism treated with thyroid replacement medication  I,Alexander Ruley,acting as a scribe for Margaree Mackintosh, MD.,have documented all relevant documentation on the behalf of Margaree Mackintosh, MD,as directed by  Margaree Mackintosh, MD while in the presence of Margaree Mackintosh, MD.   I, Margaree Mackintosh, MD, have reviewed all documentation for this visit. The documentation on 10/02/22 for the exam, diagnosis, procedures, and orders are all accurate and complete.

## 2022-10-02 NOTE — Patient Instructions (Signed)
You have been diagnosed with an acute lower respiratory infection.  Chest x-ray did not show infiltrate although rales were heard on chest exam.  Was given 1 g IM Rocephin.  CBC within has been ordered.  Please take Zithromax Z-PAK 2 tabs day 1 followed by 1 tab days 2 through 5.  Rest and stay well-hydrated.  Call if not improving in 48 hours or sooner if worse.

## 2022-10-05 ENCOUNTER — Other Ambulatory Visit (HOSPITAL_COMMUNITY): Payer: Self-pay

## 2022-10-06 ENCOUNTER — Other Ambulatory Visit (HOSPITAL_COMMUNITY): Payer: Self-pay

## 2022-10-13 ENCOUNTER — Other Ambulatory Visit (HOSPITAL_COMMUNITY): Payer: Self-pay

## 2022-10-13 DIAGNOSIS — N1831 Chronic kidney disease, stage 3a: Secondary | ICD-10-CM | POA: Diagnosis not present

## 2022-10-13 DIAGNOSIS — E039 Hypothyroidism, unspecified: Secondary | ICD-10-CM | POA: Diagnosis not present

## 2022-10-13 DIAGNOSIS — E669 Obesity, unspecified: Secondary | ICD-10-CM | POA: Diagnosis not present

## 2022-10-13 DIAGNOSIS — I129 Hypertensive chronic kidney disease with stage 1 through stage 4 chronic kidney disease, or unspecified chronic kidney disease: Secondary | ICD-10-CM | POA: Diagnosis not present

## 2022-10-13 DIAGNOSIS — R000 Tachycardia, unspecified: Secondary | ICD-10-CM | POA: Diagnosis not present

## 2022-10-13 LAB — VITAMIN D 25 HYDROXY (VIT D DEFICIENCY, FRACTURES): Vit D, 25-Hydroxy: 45.2

## 2022-10-13 LAB — BASIC METABOLIC PANEL
BUN: 14 (ref 4–21)
CO2: 21 (ref 13–22)
Chloride: 104 (ref 99–108)
Creatinine: 1.2 — AB (ref 0.5–1.1)
Glucose: 96
Potassium: 4.6 mEq/L (ref 3.5–5.1)
Sodium: 140 (ref 137–147)

## 2022-10-13 LAB — CBC AND DIFFERENTIAL
HCT: 40 (ref 36–46)
Hemoglobin: 12.8 (ref 12.0–16.0)

## 2022-10-13 LAB — COMPREHENSIVE METABOLIC PANEL
Albumin: 4.2 (ref 3.5–5.0)
Calcium: 9.3 (ref 8.7–10.7)
eGFR: 51

## 2022-10-13 LAB — CBC: RBC: 4.85 (ref 3.87–5.11)

## 2022-10-13 MED ORDER — METOPROLOL SUCCINATE ER 25 MG PO TB24
25.0000 mg | ORAL_TABLET | Freq: Every day | ORAL | 5 refills | Status: DC
  Filled 2022-10-13: qty 30, 30d supply, fill #0
  Filled 2022-11-01 – 2022-11-16 (×2): qty 30, 30d supply, fill #1
  Filled 2022-12-15: qty 30, 30d supply, fill #2
  Filled 2023-01-20: qty 30, 30d supply, fill #3
  Filled 2023-02-16: qty 30, 30d supply, fill #4
  Filled 2023-03-15: qty 30, 30d supply, fill #5

## 2022-10-14 LAB — TSH: TSH: 2.59 (ref 0.41–5.90)

## 2022-10-14 LAB — CBC AND DIFFERENTIAL
Creatinine, POC: 299.9 mg/dL
Neutrophils Absolute: 1.3
Platelets: 148 10*3/uL — AB (ref 150–400)
WBC: 4.1

## 2022-10-19 ENCOUNTER — Other Ambulatory Visit: Payer: Self-pay

## 2022-10-19 ENCOUNTER — Other Ambulatory Visit (HOSPITAL_COMMUNITY): Payer: Self-pay

## 2022-10-19 DIAGNOSIS — F411 Generalized anxiety disorder: Secondary | ICD-10-CM | POA: Diagnosis not present

## 2022-10-19 DIAGNOSIS — F422 Mixed obsessional thoughts and acts: Secondary | ICD-10-CM | POA: Diagnosis not present

## 2022-10-19 DIAGNOSIS — F902 Attention-deficit hyperactivity disorder, combined type: Secondary | ICD-10-CM | POA: Diagnosis not present

## 2022-10-19 DIAGNOSIS — F331 Major depressive disorder, recurrent, moderate: Secondary | ICD-10-CM | POA: Diagnosis not present

## 2022-10-19 MED ORDER — AMPHETAMINE-DEXTROAMPHET ER 20 MG PO CP24
ORAL_CAPSULE | ORAL | 0 refills | Status: DC
  Filled 2022-10-19: qty 60, 30d supply, fill #0

## 2022-10-26 ENCOUNTER — Other Ambulatory Visit: Payer: Self-pay

## 2022-10-26 ENCOUNTER — Encounter: Payer: Self-pay | Admitting: Internal Medicine

## 2022-10-28 ENCOUNTER — Other Ambulatory Visit: Payer: Self-pay

## 2022-10-29 ENCOUNTER — Other Ambulatory Visit: Payer: Self-pay | Admitting: Physician Assistant

## 2022-10-29 ENCOUNTER — Other Ambulatory Visit (HOSPITAL_COMMUNITY): Payer: Self-pay

## 2022-10-29 DIAGNOSIS — L4050 Arthropathic psoriasis, unspecified: Secondary | ICD-10-CM

## 2022-10-30 ENCOUNTER — Other Ambulatory Visit (HOSPITAL_COMMUNITY): Payer: Self-pay

## 2022-10-30 ENCOUNTER — Other Ambulatory Visit: Payer: Self-pay

## 2022-10-30 MED ORDER — ENBREL MINI 50 MG/ML ~~LOC~~ SOCT
50.0000 mg | SUBCUTANEOUS | 0 refills | Status: DC
Start: 2022-10-30 — End: 2023-02-05
  Filled 2022-10-30: qty 4, 28d supply, fill #0
  Filled 2022-12-07: qty 4, 28d supply, fill #1
  Filled 2023-01-08: qty 4, 28d supply, fill #2

## 2022-10-30 NOTE — Telephone Encounter (Signed)
Last Fill: 07/07/2022  Labs: 10/13/2022  Creatinine 1.16 eGFR 51 Phosphorus 2.6 MCH 26.4 Platelets 148 Neutrophils 1.3  TB Gold: 02/25/2022  TB Gold is negative.   Next Visit: 01/14/2023  Last Visit: 08/10/2022  [id number] arthropathy   Current Dose per office note 08/10/2022: Enbrel 50 mg subcutaneous injections once weekly   Okay to refill Enbrel?

## 2022-11-03 ENCOUNTER — Other Ambulatory Visit (HOSPITAL_COMMUNITY): Payer: Self-pay

## 2022-11-03 ENCOUNTER — Other Ambulatory Visit: Payer: Self-pay

## 2022-11-05 ENCOUNTER — Other Ambulatory Visit: Payer: Self-pay

## 2022-11-05 ENCOUNTER — Other Ambulatory Visit (HOSPITAL_COMMUNITY): Payer: Self-pay

## 2022-11-06 ENCOUNTER — Other Ambulatory Visit: Payer: Self-pay

## 2022-11-09 ENCOUNTER — Other Ambulatory Visit: Payer: Self-pay

## 2022-11-13 NOTE — Progress Notes (Signed)
Patient Care Team: Margaree Mackintosh, MD as PCP - General (Internal Medicine) Evlyn Kanner, MD (Inactive) as Consulting Physician (Surgery)  Visit Date: 11/20/22  Subjective:    Patient ID: Sarah Horton , Female   DOB: [birth year], 72 y.o.    MRN: [id number]   72 y.o. Female presents today for annual comprehensive physical exam.  History of allergic rhinitis treated with albuterol inhaler, cetirizine 10 mg daily, montelukast 10 mg at bedtime.   History of hypothyroidism treated with levothyroxine 100 mcg daily. TSH at 1.83.  History of hypertension treated with clonidine 0.1 mg at bedtime, metoprolol succinate 25 mg daily.  History of GERD treated with famotidine 20 mg daily.  History of adult ADD treated with Adderall 30 mg in morning and lunchtime.  Bone density -2.3 at [facility] in February 2023.  Patient has a history of psoriatic arthritis treated with Enbrel by Dr. Corliss Skains.  History of chronic kidney disease stage IIIa followed by [facility]. Creatinine elevated at 1.11. GFR low at 54.  Had bilateral knee replacement 2011.  History of recurrent urinary infections.  Had partial hysterectomy 1999.  History of endometriosis 1995.  Stress fracture of left foot in the remote past.  History of rotator cuff tear and torn biceps muscle on the right.  Urethral dilatation and D&C 1979  Endometrial cauterization 1985  Hammertoe and bunion surgery  Sinus surgery 2002  History of migraine headaches treated with sumatriptan 20 mg every two hours as needed by Dr. Geralyn Flash in 2017.  Takes tramadol sparingly for musculoskeletal pain.  Cataract extraction in 2003 on the left and in 2007 on the right.  Venous ablations 2017  Glucose normal. Liver functions normal. Electrolytes normal. Blood proteins normal. WBC low at 2.8. Platelets low at 126,000. Neutro Abs low at 680. Lipid panel normal.   Last mammogram 07/22/22. No significant mammographic  findings. Recommended repeat in 2025.  Colonoscopy 2017 by Dr. Leone Payor with 10-year follow-up recommended.  Social history: She is married.  Husband is also a patient here.  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  Husband has multiple medical issues.  They reside in [place].  Past Medical History:  Diagnosis Date   Allergic rhinitis, cause unspecified    Allergy    Anemia, unspecified    Anxiety state, unspecified    Arthritis    Cataract    bilaterally removed 2003,2007   Esophageal reflux    Irritable bowel syndrome    Migraine    Obesity, unspecified    Psoriatic arthropathy (HCC)    Unspecified chronic bronchitis (HCC)    Unspecified hereditary and idiopathic peripheral neuropathy    Unspecified hypothyroidism    Unspecified venous (peripheral) insufficiency      Family History  Problem Relation Age of Onset   COPD Mother    Heart disease Mother    Hypertension Mother    Varicose Veins Mother    Cirrhosis Father    Thyroid disease Sister    Breast cancer Maternal Grandmother    Breast cancer Other        1st cousin    Pancreatic cancer Other        1st cousin    Lymphoma Cousin    Colon cancer Neg Hx    Colon polyps Neg Hx    Esophageal cancer Neg Hx    Rectal cancer Neg Hx    Stomach cancer Neg Hx     Social History  Social History Narrative   Not on file      Review of Systems  Constitutional:  Negative for chills, fever, malaise/fatigue and weight loss.  HENT:  Negative for hearing loss, sinus pain and sore throat.   Respiratory:  Negative for cough, hemoptysis and shortness of breath.   Cardiovascular:  Negative for chest pain, palpitations, leg swelling and PND.  Gastrointestinal:  Negative for abdominal pain, constipation, diarrhea, heartburn, nausea and vomiting.  Genitourinary:  Negative for dysuria, frequency and urgency.  Musculoskeletal:  Negative for back pain, myalgias and neck pain.  Skin:   Negative for itching and rash.  Neurological:  Negative for dizziness, tingling, seizures and headaches.  Endo/Heme/Allergies:  Negative for polydipsia.  Psychiatric/Behavioral:  Negative for depression. The patient is not nervous/anxious.         Objective:   Vitals: BP 122/88   Pulse 88   Temp 97.6 F (36.4 C) (Tympanic)   Resp 16   Ht 5\' 7"  (1.702 m)   Wt 223 lb 8 oz (101.4 kg)   SpO2 97%   BMI 35.01 kg/m    Physical Exam Vitals and nursing note reviewed.  Constitutional:      General: She is not in acute distress.    Appearance: Normal appearance. She is not ill-appearing or toxic-appearing.  HENT:     Head: Normocephalic and atraumatic.     Right Ear: Hearing, tympanic membrane, ear canal and external ear normal.     Left Ear: Hearing, tympanic membrane, ear canal and external ear normal.     Mouth/Throat:     Pharynx: Oropharynx is clear.  Eyes:     Extraocular Movements: Extraocular movements intact.     Pupils: Pupils are equal, round, and reactive to light.  Neck:     Thyroid: No thyroid mass, thyromegaly or thyroid tenderness.     Vascular: No carotid bruit.  Cardiovascular:     Rate and Rhythm: Normal rate and regular rhythm. No extrasystoles are present.    Pulses:          Dorsalis pedis pulses are 1+ on the right side and 1+ on the left side.     Heart sounds: Murmur heard.     Systolic murmur is present with a grade of 1/6.     No friction rub. No gallop.  Pulmonary:     Effort: Pulmonary effort is normal.     Breath sounds: Normal breath sounds. No decreased breath sounds, wheezing, rhonchi or rales.  Chest:     Chest wall: No mass.  Abdominal:     Palpations: Abdomen is soft. There is no hepatomegaly, splenomegaly or mass.     Tenderness: There is no abdominal tenderness.     Hernia: No hernia is present.  Musculoskeletal:     Cervical back: Normal range of motion.     Right lower leg: No edema.     Left lower leg: No edema.   Lymphadenopathy:     Cervical: No cervical adenopathy.     Upper Body:     Right upper body: No supraclavicular adenopathy.     Left upper body: No supraclavicular adenopathy.  Skin:    General: Skin is warm and dry.  Neurological:     General: No focal deficit present.     Mental Status: She is alert and oriented to person, place, and time. Mental status is at baseline.     Sensory: Sensation is intact.     Motor: Motor function is intact.  No weakness.     Deep Tendon Reflexes: Reflexes are normal and symmetric.  Psychiatric:        Attention and Perception: Attention normal.        Mood and Affect: Mood normal.        Speech: Speech normal.        Behavior: Behavior normal.        Thought Content: Thought content normal.        Cognition and Memory: Cognition normal.        Judgment: Judgment normal.       Results:   Studies obtained and personally reviewed by me:  Last mammogram 07/22/22. No significant mammographic findings. Recommended repeat in 2025.  Colonoscopy 2017 by Dr. Leone Payor with 10-year follow-up recommended.  Labs:       Component Value Date/Time   NA 140 11/17/2022 1109   NA 140 10/13/2022 0000   K 4.2 11/17/2022 1109   CL 107 11/17/2022 1109   CO2 25 11/17/2022 1109   GLUCOSE 97 11/17/2022 1109   GLUCOSE 90 04/16/2006 1125   BUN 12 11/17/2022 1109   BUN 14 10/13/2022 0000   CREATININE 1.11 (H) 11/17/2022 1109   CALCIUM 9.2 11/17/2022 1109   PROT 6.9 11/17/2022 1109   PROT 6.7 09/08/2016 1648   ALBUMIN 4.2 10/13/2022 0000   ALBUMIN 4.2 09/08/2016 1648   AST 20 11/17/2022 1109   ALT 16 11/17/2022 1109   ALKPHOS 87 01/25/2017 1617   BILITOT 0.5 11/17/2022 1109   BILITOT 0.3 09/08/2016 1648   GFRNONAA 61 11/29/2020 1155   GFRAA 71 11/29/2020 1155     Lab Results  Component Value Date   WBC 2.8 (L) 11/17/2022   HGB 11.9 11/17/2022   HCT 35.8 11/17/2022   MCV 81.4 11/17/2022   PLT 126 (L) 11/17/2022    Lab Results  Component Value  Date   CHOL 170 11/17/2022   HDL 57 11/17/2022   LDLCALC 91 11/17/2022   TRIG 128 11/17/2022   CHOLHDL 3.0 11/17/2022    Lab Results  Component Value Date   HGBA1C 5.0 04/01/2021     Lab Results  Component Value Date   TSH 1.83 11/17/2022      Assessment & Plan:   Allergic rhinitis: treated with albuterol inhaler, cetirizine 10 mg daily, montelukast 10 mg at bedtime.   Hypothyroidism: treated with levothyroxine 100 mcg daily. TSH at 1.83.  Hypertension: treated with clonidine 0.1 mg at bedtime, metoprolol succinate 25 mg daily.  GERD: treated with famotidine 20 mg daily.  Adult ADD: treated with Adderall 30 mg in morning and lunchtime.  Psoriatic arthritis: treated with Enbrel by Dr. Corliss Skains.  ADD: treated with Adderall XR 30 mg daily  Chronic kidney disease stage IIIa: followed by [facility]. Creatinine elevated at 1.11. GFR low at 54.  Migraine headaches: treated with sumatriptan 20 mg every two hours as needed by Dr. Geralyn Flash in 2017. Refilled sumatriptan.  Musculoskeletal pain: takes tramadol sparingly.  Pelvic deferred to [facility].  Last mammogram 07/22/22. No significant mammographic findings. Recommended repeat in 2025.  Colonoscopy: 2017 by Dr. Leone Payor with 10-year follow-up recommended.  Bone density -2.3 at [facility] in February 2023.  Vaccine counseling: UTD on flu vaccines. Administered pneumococcal 20 vaccine. Declined tetanus vaccine.  Return in 1 year for health maintenance exam or as needed.    I,Alexander Ruley,acting as a Neurosurgeon for Margaree Mackintosh, MD.,have documented all relevant documentation on the behalf of Margaree Mackintosh, MD,as directed  by  Margaree Mackintosh, MD while in the presence of Margaree Mackintosh, MD.   I, Margaree Mackintosh, MD, have reviewed all documentation for this visit. The documentation on 12/02/22 for the exam, diagnosis, procedures, and orders are all accurate and complete.

## 2022-11-16 ENCOUNTER — Other Ambulatory Visit (HOSPITAL_COMMUNITY): Payer: Self-pay

## 2022-11-17 ENCOUNTER — Other Ambulatory Visit: Payer: BC Managed Care – PPO

## 2022-11-17 DIAGNOSIS — Z6836 Body mass index (BMI) 36.0-36.9, adult: Secondary | ICD-10-CM

## 2022-11-17 DIAGNOSIS — Z0000 Encounter for general adult medical examination without abnormal findings: Secondary | ICD-10-CM

## 2022-11-17 DIAGNOSIS — E063 Autoimmune thyroiditis: Secondary | ICD-10-CM | POA: Diagnosis not present

## 2022-11-17 DIAGNOSIS — Z13220 Encounter for screening for lipoid disorders: Secondary | ICD-10-CM

## 2022-11-17 DIAGNOSIS — E038 Other specified hypothyroidism: Secondary | ICD-10-CM | POA: Diagnosis not present

## 2022-11-17 DIAGNOSIS — E7800 Pure hypercholesterolemia, unspecified: Secondary | ICD-10-CM | POA: Diagnosis not present

## 2022-11-17 DIAGNOSIS — I10 Essential (primary) hypertension: Secondary | ICD-10-CM | POA: Diagnosis not present

## 2022-11-17 LAB — CBC WITH DIFFERENTIAL/PLATELET
Absolute Monocytes: 384 cells/uL (ref 200–950)
Basophils Absolute: 20 cells/uL (ref 0–200)
Basophils Relative: 0.7 %
Eosinophils Relative: 1.4 %
MCHC: 33.2 g/dL (ref 32.0–36.0)
Monocytes Relative: 13.7 %
Platelets: 126 10*3/uL — ABNORMAL LOW (ref 140–400)
RDW: 14.5 % (ref 11.0–15.0)
Total Lymphocyte: 59.9 %

## 2022-11-18 LAB — CBC WITH DIFFERENTIAL/PLATELET
Eosinophils Absolute: 39 cells/uL (ref 15–500)
HCT: 35.8 % (ref 35.0–45.0)
Hemoglobin: 11.9 g/dL (ref 11.7–15.5)
Lymphs Abs: 1677 cells/uL (ref 850–3900)
MCH: 27 pg (ref 27.0–33.0)
MCV: 81.4 fL (ref 80.0–100.0)
MPV: 11.1 fL (ref 7.5–12.5)
Neutro Abs: 680 cells/uL — ABNORMAL LOW (ref 1500–7800)
Neutrophils Relative %: 24.3 %
RBC: 4.4 10*6/uL (ref 3.80–5.10)
WBC: 2.8 10*3/uL — ABNORMAL LOW (ref 3.8–10.8)

## 2022-11-18 LAB — COMPLETE METABOLIC PANEL WITH GFR
AG Ratio: 1.6 (calc) (ref 1.0–2.5)
ALT: 16 U/L (ref 6–29)
AST: 20 U/L (ref 10–35)
Albumin: 4.2 g/dL (ref 3.6–5.1)
Alkaline phosphatase (APISO): 77 U/L (ref 37–153)
BUN/Creatinine Ratio: 11 (calc) (ref 6–22)
BUN: 12 mg/dL (ref 7–25)
CO2: 25 mmol/L (ref 20–32)
Calcium: 9.2 mg/dL (ref 8.6–10.4)
Chloride: 107 mmol/L (ref 98–110)
Creat: 1.11 mg/dL — ABNORMAL HIGH (ref 0.50–1.05)
Globulin: 2.7 g/dL (calc) (ref 1.9–3.7)
Glucose, Bld: 97 mg/dL (ref 65–99)
Potassium: 4.2 mmol/L (ref 3.5–5.3)
Sodium: 140 mmol/L (ref 135–146)
Total Bilirubin: 0.5 mg/dL (ref 0.2–1.2)
Total Protein: 6.9 g/dL (ref 6.1–8.1)
eGFR: 54 mL/min/{1.73_m2} — ABNORMAL LOW (ref >=60)

## 2022-11-18 LAB — LIPID PANEL
Cholesterol: 170 mg/dL (ref <200)
HDL: 57 mg/dL (ref >=50)
LDL Cholesterol (Calc): 91 mg/dL (calc)
Non-HDL Cholesterol (Calc): 113 mg/dL (calc) (ref <130)
Total CHOL/HDL Ratio: 3 (calc) (ref <5.0)
Triglycerides: 128 mg/dL (ref <150)

## 2022-11-18 LAB — TSH: TSH: 1.83 mIU/L (ref 0.40–4.50)

## 2022-11-20 ENCOUNTER — Ambulatory Visit (INDEPENDENT_AMBULATORY_CARE_PROVIDER_SITE_OTHER): Payer: BC Managed Care – PPO | Admitting: Internal Medicine

## 2022-11-20 ENCOUNTER — Encounter: Payer: Self-pay | Admitting: Internal Medicine

## 2022-11-20 ENCOUNTER — Other Ambulatory Visit (HOSPITAL_COMMUNITY): Payer: Self-pay

## 2022-11-20 ENCOUNTER — Other Ambulatory Visit: Payer: Self-pay

## 2022-11-20 VITALS — BP 122/88 | HR 88 | Temp 97.6°F | Resp 16 | Ht 67.0 in | Wt 223.5 lb

## 2022-11-20 DIAGNOSIS — K219 Gastro-esophageal reflux disease without esophagitis: Secondary | ICD-10-CM

## 2022-11-20 DIAGNOSIS — N1832 Chronic kidney disease, stage 3b: Secondary | ICD-10-CM

## 2022-11-20 DIAGNOSIS — Z0000 Encounter for general adult medical examination without abnormal findings: Secondary | ICD-10-CM

## 2022-11-20 DIAGNOSIS — L4050 Arthropathic psoriasis, unspecified: Secondary | ICD-10-CM

## 2022-11-20 DIAGNOSIS — E063 Autoimmune thyroiditis: Secondary | ICD-10-CM

## 2022-11-20 DIAGNOSIS — Z8669 Personal history of other diseases of the nervous system and sense organs: Secondary | ICD-10-CM

## 2022-11-20 DIAGNOSIS — Z23 Encounter for immunization: Secondary | ICD-10-CM

## 2022-11-20 DIAGNOSIS — M7918 Myalgia, other site: Secondary | ICD-10-CM

## 2022-11-20 DIAGNOSIS — I10 Essential (primary) hypertension: Secondary | ICD-10-CM | POA: Diagnosis not present

## 2022-11-20 DIAGNOSIS — N1831 Chronic kidney disease, stage 3a: Secondary | ICD-10-CM

## 2022-11-20 DIAGNOSIS — E038 Other specified hypothyroidism: Secondary | ICD-10-CM | POA: Diagnosis not present

## 2022-11-20 DIAGNOSIS — J301 Allergic rhinitis due to pollen: Secondary | ICD-10-CM

## 2022-11-20 DIAGNOSIS — Z96653 Presence of artificial knee joint, bilateral: Secondary | ICD-10-CM

## 2022-11-20 LAB — POCT URINALYSIS DIPSTICK
Bilirubin, UA: NEGATIVE
Blood, UA: NEGATIVE
Glucose, UA: NEGATIVE
Ketones, UA: NEGATIVE
Leukocytes, UA: NEGATIVE
Nitrite, UA: NEGATIVE
Protein, UA: NEGATIVE
Spec Grav, UA: 1.015 (ref 1.010–1.025)
Urobilinogen, UA: 0.2 E.U./dL
pH, UA: 5 (ref 5.0–8.0)

## 2022-11-20 MED ORDER — SUMATRIPTAN 20 MG/ACT NA SOLN
1.0000 | NASAL | 2 refills | Status: DC | PRN
  Filled 2022-11-20: qty 6, 30d supply, fill #0

## 2022-11-23 ENCOUNTER — Other Ambulatory Visit (HOSPITAL_COMMUNITY): Payer: Self-pay

## 2022-11-23 ENCOUNTER — Other Ambulatory Visit: Payer: Self-pay

## 2022-11-25 ENCOUNTER — Other Ambulatory Visit (HOSPITAL_COMMUNITY): Payer: Self-pay

## 2022-11-27 ENCOUNTER — Other Ambulatory Visit (HOSPITAL_COMMUNITY): Payer: Self-pay

## 2022-11-30 ENCOUNTER — Other Ambulatory Visit (HOSPITAL_COMMUNITY): Payer: Self-pay

## 2022-12-02 ENCOUNTER — Other Ambulatory Visit (HOSPITAL_COMMUNITY): Payer: Self-pay

## 2022-12-02 NOTE — Patient Instructions (Signed)
It was a pleasure to see you today.  It seems that multiple medical issues are stable on current regimen.  Pelvic deferred to [facility].  Colonoscopy is up-to-date.  Mammogram is up-to-date.  Pneumococcal 20 vaccine given today.  She declines tetanus immunization.  Return in 1 year or as needed.  Continue follow-up with [facility] for chronic kidney disease.

## 2022-12-07 ENCOUNTER — Other Ambulatory Visit (HOSPITAL_COMMUNITY): Payer: Self-pay

## 2022-12-07 ENCOUNTER — Other Ambulatory Visit: Payer: Self-pay

## 2022-12-09 DIAGNOSIS — I781 Nevus, non-neoplastic: Secondary | ICD-10-CM | POA: Diagnosis not present

## 2022-12-09 DIAGNOSIS — L814 Other melanin hyperpigmentation: Secondary | ICD-10-CM | POA: Diagnosis not present

## 2022-12-09 DIAGNOSIS — L719 Rosacea, unspecified: Secondary | ICD-10-CM | POA: Diagnosis not present

## 2022-12-11 ENCOUNTER — Other Ambulatory Visit: Payer: Self-pay

## 2022-12-11 ENCOUNTER — Other Ambulatory Visit (HOSPITAL_COMMUNITY): Payer: Self-pay

## 2022-12-11 MED ORDER — METRONIDAZOLE 0.75 % EX GEL
1.0000 | Freq: Two times a day (BID) | CUTANEOUS | 5 refills | Status: AC
  Filled 2022-12-11: qty 45, 30d supply, fill #0

## 2022-12-15 ENCOUNTER — Other Ambulatory Visit (HOSPITAL_COMMUNITY): Payer: Self-pay

## 2022-12-16 ENCOUNTER — Other Ambulatory Visit: Payer: Self-pay

## 2022-12-16 ENCOUNTER — Other Ambulatory Visit (HOSPITAL_COMMUNITY): Payer: Self-pay

## 2022-12-18 DIAGNOSIS — R7301 Impaired fasting glucose: Secondary | ICD-10-CM | POA: Diagnosis not present

## 2022-12-18 DIAGNOSIS — E781 Pure hyperglyceridemia: Secondary | ICD-10-CM | POA: Diagnosis not present

## 2022-12-18 DIAGNOSIS — E039 Hypothyroidism, unspecified: Secondary | ICD-10-CM | POA: Diagnosis not present

## 2022-12-21 ENCOUNTER — Other Ambulatory Visit: Payer: Self-pay

## 2022-12-21 ENCOUNTER — Other Ambulatory Visit (HOSPITAL_COMMUNITY): Payer: Self-pay

## 2022-12-21 MED ORDER — AMPHETAMINE-DEXTROAMPHET ER 20 MG PO CP24
20.0000 mg | ORAL_CAPSULE | Freq: Two times a day (BID) | ORAL | 0 refills | Status: DC
  Filled 2022-12-21 – 2022-12-28 (×2): qty 60, 30d supply, fill #0

## 2022-12-23 ENCOUNTER — Other Ambulatory Visit (HOSPITAL_COMMUNITY): Payer: Self-pay

## 2022-12-24 ENCOUNTER — Other Ambulatory Visit: Payer: Self-pay

## 2022-12-24 ENCOUNTER — Other Ambulatory Visit (HOSPITAL_COMMUNITY): Payer: Self-pay

## 2022-12-24 DIAGNOSIS — E039 Hypothyroidism, unspecified: Secondary | ICD-10-CM | POA: Diagnosis not present

## 2022-12-24 DIAGNOSIS — L659 Nonscarring hair loss, unspecified: Secondary | ICD-10-CM | POA: Diagnosis not present

## 2022-12-24 DIAGNOSIS — E669 Obesity, unspecified: Secondary | ICD-10-CM | POA: Diagnosis not present

## 2022-12-24 DIAGNOSIS — R7301 Impaired fasting glucose: Secondary | ICD-10-CM | POA: Diagnosis not present

## 2022-12-24 MED ORDER — SYNTHROID 112 MCG PO TABS
112.0000 ug | ORAL_TABLET | Freq: Every morning | ORAL | 11 refills | Status: DC
  Filled 2022-12-24: qty 30, 30d supply, fill #0
  Filled 2023-01-20: qty 30, 30d supply, fill #1
  Filled 2023-02-18 – 2023-02-24 (×3): qty 30, 30d supply, fill #2
  Filled 2023-03-04 – 2023-03-19 (×3): qty 30, 30d supply, fill #3
  Filled 2023-04-19: qty 30, 30d supply, fill #4
  Filled 2023-05-18: qty 30, 30d supply, fill #5
  Filled 2023-06-11: qty 30, 30d supply, fill #6
  Filled 2023-07-07 – 2023-07-12 (×3): qty 30, 30d supply, fill #7
  Filled 2023-08-09 – 2023-08-16 (×4): qty 30, 30d supply, fill #8
  Filled 2023-09-01 – 2023-09-08 (×2): qty 30, 30d supply, fill #9
  Filled 2023-10-08: qty 30, 30d supply, fill #10
  Filled 2023-11-08: qty 30, 30d supply, fill #11

## 2022-12-28 ENCOUNTER — Other Ambulatory Visit (HOSPITAL_COMMUNITY): Payer: Self-pay

## 2022-12-29 ENCOUNTER — Other Ambulatory Visit: Payer: Self-pay

## 2022-12-31 ENCOUNTER — Other Ambulatory Visit (HOSPITAL_COMMUNITY): Payer: Self-pay

## 2023-01-01 ENCOUNTER — Other Ambulatory Visit (HOSPITAL_COMMUNITY): Payer: Self-pay

## 2023-01-01 NOTE — Progress Notes (Deleted)
Office Visit Note  Patient: Sarah Horton             Date of Birth: 07-26-1950           MRN: [id number]             PCP: Margaree Mackintosh, MD Referring: Margaree Mackintosh, MD Visit Date: 01/14/2023 Occupation: @GUAROCC @  Subjective:  No chief complaint on file.   History of Present Illness: Sarah Horton is a 72 y.o. female ***     Activities of Daily Living:  Patient reports morning stiffness for *** {minute/hour:19697}.   Patient {ACTIONS;DENIES/REPORTS:21021675::"Denies"} nocturnal pain.  Difficulty dressing/grooming: {ACTIONS;DENIES/REPORTS:21021675::"Denies"} Difficulty climbing stairs: {ACTIONS;DENIES/REPORTS:21021675::"Denies"} Difficulty getting out of chair: {ACTIONS;DENIES/REPORTS:21021675::"Denies"} Difficulty using hands for taps, buttons, cutlery, and/or writing: {ACTIONS;DENIES/REPORTS:21021675::"Denies"}  No Rheumatology ROS completed.   PMFS History:  Patient Active Problem List   Diagnosis Date Noted   History of peripheral neuropathy 11/20/2016   Left wrist tendonitis/ history of surgery for Dequervains  11/20/2016   Raynaud's disease without gangrene 09/22/2016   High risk medication use 09/22/2016   History of ulcer of lower limb/ Venous stasis ulcer 01/2015  09/22/2016   Osteopenia of multiple sites 09/22/2016   Psoriasis 09/03/2016   Chest wall pain 07/16/2016   Flank pain, acute 05/15/2016   Subacromial bursitis of right shoulder joint 04/10/2016   Low back pain 04/09/2016   Pre-ulcerative calluses 02/11/2016   Varicose veins of right lower extremity with complications 08/12/2015   Elevated BP 08/09/2015   Varicose veins of left lower extremity with complications 07/29/2015   Varicose veins of bilateral lower extremities with other complications 07/09/2015   Varicose veins of lower extremities with ulcer (HCC) 04/05/2015   Status post bilateral knee replacements 12/13/2013   Venous stasis ulcer of ankle (HCC) 06/06/2013   ADD  (attention deficit disorder) 04/04/2012   INSOMNIA 06/20/2010   Hypothyroidism 01/17/2010   Obesity 01/17/2010   Osteoarthritis 01/17/2010   ANEMIA 10/02/2009   SINUSITIS, ACUTE 10/02/2009   Asthma 10/02/2009   Venous (peripheral) insufficiency 03/11/2008   Allergic rhinitis 03/11/2008   BRONCHITIS, RECURRENT 03/11/2008   History of migraine 03/11/2008   Anxiety state 11/24/2007   Hereditary and idiopathic peripheral neuropathy 11/24/2007   GERD 11/24/2007   IRRITABLE BOWEL SYNDROME 11/24/2007   PSORIATIC ARTHROPATHY 11/24/2007    Past Medical History:  Diagnosis Date   Allergic rhinitis, cause unspecified    Allergy    Anemia, unspecified    Anxiety state, unspecified    Arthritis    Cataract    bilaterally removed 2003,2007   Esophageal reflux    Irritable bowel syndrome    Migraine    Obesity, unspecified    Psoriatic arthropathy (HCC)    Unspecified chronic bronchitis (HCC)    Unspecified hereditary and idiopathic peripheral neuropathy    Unspecified hypothyroidism    Unspecified venous (peripheral) insufficiency     Family History  Problem Relation Age of Onset   COPD Mother    Heart disease Mother    Hypertension Mother    Varicose Veins Mother    Cirrhosis Father    Thyroid disease Sister    Breast cancer Maternal Grandmother    Breast cancer Other        1st cousin    Pancreatic cancer Other        1st cousin    Lymphoma Cousin    Colon cancer Neg Hx    Colon polyps Neg Hx    Esophageal cancer Neg Hx  Rectal cancer Neg Hx    Stomach cancer Neg Hx    Past Surgical History:  Procedure Laterality Date   ABDOMINAL HYSTERECTOMY     ABDOMINAL HYSTERECTOMY     ABLATION ON ENDOMETRIOSIS     x2   Bilat TKRs  04/2010   by [name]   BUNIONECTOMY  2009   CATARACT EXTRACTION, BILATERAL     2003,2007   COLONOSCOPY  08-26-2004   with gessner tics and hems    dequervains tensenovitis Left    JOINT REPLACEMENT Bilateral    knees   NASAL SINUS SURGERY      SHOULDER SURGERY  2005   TOTAL SHOULDER ARTHROPLASTY     Social History   Social History Narrative   Not on file   Immunization History  Administered Date(s) Administered   Fluad Quad(high Dose 65+) 05/16/2022   H1N1 05/18/2008   Influenza Inj Mdck Quad Pf 04/01/2017   Influenza Inj Mdck Quad With Preservative 04/13/2018   Influenza Split 03/23/2011, 03/14/2012, 03/23/2013, 03/22/2016   Influenza Whole 03/08/2008, 03/08/2009, 03/07/2010   Influenza,inj,Quad PF,6+ Mos 03/21/2014, 03/31/2019, 04/03/2021   PFIZER(Purple Top)SARS-COV-2 Vaccination 08/22/2019, 09/12/2019   PNEUMOCOCCAL CONJUGATE-20 11/20/2022   Pneumococcal Conjugate-13 06/28/2019   Td 01/06/2002   Tdap 03/14/2012   Zoster Recombinant(Shingrix) 03/08/2018     Objective: Vital Signs: There were no vitals taken for this visit.   Physical Exam   Musculoskeletal Exam: ***  CDAI Exam: CDAI Score: -- Patient Global: --; Provider Global: -- Swollen: --; Tender: -- Joint Exam 01/14/2023   No joint exam has been documented for this visit   There is currently no information documented on the homunculus. Go to the Rheumatology activity and complete the homunculus joint exam.  Investigation: No additional findings.  Imaging: No results found.  Recent Labs: Lab Results  Component Value Date   WBC 2.8 (L) 11/17/2022   HGB 11.9 11/17/2022   PLT 126 (L) 11/17/2022   NA 140 11/17/2022   K 4.2 11/17/2022   CL 107 11/17/2022   CO2 25 11/17/2022   GLUCOSE 97 11/17/2022   BUN 12 11/17/2022   CREATININE 1.11 (H) 11/17/2022   BILITOT 0.5 11/17/2022   ALKPHOS 87 01/25/2017   AST 20 11/17/2022   ALT 16 11/17/2022   PROT 6.9 11/17/2022   ALBUMIN 4.2 10/13/2022   CALCIUM 9.2 11/17/2022   GFRAA 71 11/29/2020   QFTBGOLDPLUS NEGATIVE 02/25/2022    Speciality Comments: Prior therapy: Methotrexate (elevated creatinine),Arava-pt dcd  Procedures:  No procedures performed Allergies: Nucynta [tapentadol],  Codeine, Penicillins, Tessalon [benzonatate], and Other   Assessment / Plan:     Visit Diagnoses: No diagnosis found.  Orders: No orders of the defined types were placed in this encounter.  No orders of the defined types were placed in this encounter.   Face-to-face time spent with patient was *** minutes. Greater than 50% of time was spent in counseling and coordination of care.  Follow-Up Instructions: No follow-ups on file.   Ellen Henri, CMA  Note - This record has been created using Animal nutritionist.  Chart creation errors have been sought, but may not always  have been located. Such creation errors do not reflect on  the standard of medical care.

## 2023-01-04 ENCOUNTER — Other Ambulatory Visit: Payer: Self-pay

## 2023-01-06 ENCOUNTER — Encounter (HOSPITAL_COMMUNITY): Payer: Self-pay

## 2023-01-06 ENCOUNTER — Other Ambulatory Visit (HOSPITAL_COMMUNITY): Payer: Self-pay

## 2023-01-07 ENCOUNTER — Other Ambulatory Visit (HOSPITAL_COMMUNITY): Payer: Self-pay

## 2023-01-08 ENCOUNTER — Other Ambulatory Visit (HOSPITAL_COMMUNITY): Payer: Self-pay

## 2023-01-11 ENCOUNTER — Other Ambulatory Visit: Payer: Self-pay

## 2023-01-14 ENCOUNTER — Ambulatory Visit: Payer: BC Managed Care – PPO | Admitting: Rheumatology

## 2023-01-14 DIAGNOSIS — Z872 Personal history of diseases of the skin and subcutaneous tissue: Secondary | ICD-10-CM

## 2023-01-14 DIAGNOSIS — M778 Other enthesopathies, not elsewhere classified: Secondary | ICD-10-CM

## 2023-01-14 DIAGNOSIS — Z8679 Personal history of other diseases of the circulatory system: Secondary | ICD-10-CM

## 2023-01-14 DIAGNOSIS — Z8709 Personal history of other diseases of the respiratory system: Secondary | ICD-10-CM

## 2023-01-14 DIAGNOSIS — M461 Sacroiliitis, not elsewhere classified: Secondary | ICD-10-CM

## 2023-01-14 DIAGNOSIS — L409 Psoriasis, unspecified: Secondary | ICD-10-CM

## 2023-01-14 DIAGNOSIS — G609 Hereditary and idiopathic neuropathy, unspecified: Secondary | ICD-10-CM

## 2023-01-14 DIAGNOSIS — Z79899 Other long term (current) drug therapy: Secondary | ICD-10-CM

## 2023-01-14 DIAGNOSIS — Z96653 Presence of artificial knee joint, bilateral: Secondary | ICD-10-CM

## 2023-01-14 DIAGNOSIS — I7300 Raynaud's syndrome without gangrene: Secondary | ICD-10-CM

## 2023-01-14 DIAGNOSIS — F5101 Primary insomnia: Secondary | ICD-10-CM

## 2023-01-14 DIAGNOSIS — M7062 Trochanteric bursitis, left hip: Secondary | ICD-10-CM

## 2023-01-14 DIAGNOSIS — Z8719 Personal history of other diseases of the digestive system: Secondary | ICD-10-CM

## 2023-01-14 DIAGNOSIS — Z8669 Personal history of other diseases of the nervous system and sense organs: Secondary | ICD-10-CM

## 2023-01-14 DIAGNOSIS — Z8639 Personal history of other endocrine, nutritional and metabolic disease: Secondary | ICD-10-CM

## 2023-01-14 DIAGNOSIS — M8589 Other specified disorders of bone density and structure, multiple sites: Secondary | ICD-10-CM

## 2023-01-14 DIAGNOSIS — L4050 Arthropathic psoriasis, unspecified: Secondary | ICD-10-CM

## 2023-01-18 NOTE — Progress Notes (Unsigned)
Office Visit Note  Patient: Sarah Horton             Date of Birth: 07-31-1950           MRN: [id number]             PCP: Margaree Mackintosh, MD Referring: Margaree Mackintosh, MD Visit Date: 01/19/2023 Occupation: @GUAROCC @  Subjective:  Medication monitoring   History of Present Illness: MARIELLE KLENKE is a 72 y.o. female with history of psoriatic arthritis.  Patient remains on Enbrel 50 mg subcutaneous injections once weekly. She continues to tolerate enbrel without any side effects or injection site reactions.  She denies missing any doses recently.  She denies any active psoriasis at this time.  She has intermittent stiffness in the left SI joint.  She has noticed stiffness in her neck with lateral rotation.  She denies any joint swelling at this time.  She denies any achilles tendonitis or plantar fasciitis.  She wears proper fitting shoes which helps to alleviate her feet pain.  She requested to paperwork to renew her handicap placard.     Activities of Daily Living:  Patient reports morning stiffness for 30 minutes.   Patient Reports nocturnal pain.  Difficulty dressing/grooming: Denies Difficulty climbing stairs: Denies Difficulty getting out of chair: Denies Difficulty using hands for taps, buttons, cutlery, and/or writing: Reports  Review of Systems  Constitutional:  Positive for fatigue.  HENT:  Positive for mouth dryness. Negative for mouth sores.   Eyes:  Positive for dryness.  Respiratory:  Positive for shortness of breath.   Cardiovascular:  Negative for chest pain and palpitations.  Gastrointestinal:  Negative for blood in stool, constipation and diarrhea.  Endocrine: Negative for increased urination.  Genitourinary:  Negative for involuntary urination.  Musculoskeletal:  Positive for joint pain, joint pain, joint swelling, myalgias, morning stiffness and myalgias. Negative for gait problem, muscle weakness and muscle tenderness.  Skin:  Positive for  sensitivity to sunlight. Negative for color change, rash and hair loss.  Allergic/Immunologic: Positive for susceptible to infections.  Neurological:  Positive for headaches. Negative for dizziness.  Hematological:  Negative for swollen glands.  Psychiatric/Behavioral:  Positive for sleep disturbance. Negative for depressed mood. The patient is nervous/anxious.     PMFS History:  Patient Active Problem List   Diagnosis Date Noted   History of peripheral neuropathy 11/20/2016   Left wrist tendonitis/ history of surgery for Dequervains  11/20/2016   Raynaud's disease without gangrene 09/22/2016   High risk medication use 09/22/2016   History of ulcer of lower limb/ Venous stasis ulcer 01/2015  09/22/2016   Osteopenia of multiple sites 09/22/2016   Psoriasis 09/03/2016   Chest wall pain 07/16/2016   Flank pain, acute 05/15/2016   Subacromial bursitis of right shoulder joint 04/10/2016   Low back pain 04/09/2016   Pre-ulcerative calluses 02/11/2016   Varicose veins of right lower extremity with complications 08/12/2015   Elevated BP 08/09/2015   Varicose veins of left lower extremity with complications 07/29/2015   Varicose veins of bilateral lower extremities with other complications 07/09/2015   Varicose veins of lower extremities with ulcer (HCC) 04/05/2015   Status post bilateral knee replacements 12/13/2013   Venous stasis ulcer of ankle (HCC) 06/06/2013   ADD (attention deficit disorder) 04/04/2012   INSOMNIA 06/20/2010   Hypothyroidism 01/17/2010   Obesity 01/17/2010   Osteoarthritis 01/17/2010   ANEMIA 10/02/2009   SINUSITIS, ACUTE 10/02/2009   Asthma 10/02/2009   Venous (peripheral) insufficiency  03/11/2008   Allergic rhinitis 03/11/2008   BRONCHITIS, RECURRENT 03/11/2008   History of migraine 03/11/2008   Anxiety state 11/24/2007   Hereditary and idiopathic peripheral neuropathy 11/24/2007   GERD 11/24/2007   IRRITABLE BOWEL SYNDROME 11/24/2007   PSORIATIC  ARTHROPATHY 11/24/2007    Past Medical History:  Diagnosis Date   Allergic rhinitis, cause unspecified    Allergy    Anemia, unspecified    Anxiety state, unspecified    Arthritis    Cataract    bilaterally removed 2003,2007   Esophageal reflux    Irritable bowel syndrome    Migraine    Obesity, unspecified    Psoriatic arthropathy (HCC)    Unspecified chronic bronchitis (HCC)    Unspecified hereditary and idiopathic peripheral neuropathy    Unspecified hypothyroidism    Unspecified venous (peripheral) insufficiency     Family History  Problem Relation Age of Onset   COPD Mother    Heart disease Mother    Hypertension Mother    Varicose Veins Mother    Cirrhosis Father    Thyroid disease Sister    Breast cancer Maternal Grandmother    Breast cancer Other        1st cousin    Pancreatic cancer Other        1st cousin    Lymphoma Cousin    Colon cancer Neg Hx    Colon polyps Neg Hx    Esophageal cancer Neg Hx    Rectal cancer Neg Hx    Stomach cancer Neg Hx    Past Surgical History:  Procedure Laterality Date   ABDOMINAL HYSTERECTOMY     ABDOMINAL HYSTERECTOMY     ABLATION ON ENDOMETRIOSIS     x2   Bilat TKRs  04/2010   by [name]   BUNIONECTOMY  2009   CATARACT EXTRACTION, BILATERAL     2003,2007   COLONOSCOPY  08-26-2004   with gessner tics and hems    dequervains tensenovitis Left    JOINT REPLACEMENT Bilateral    knees   NASAL SINUS SURGERY     SHOULDER SURGERY  2005   TOTAL SHOULDER ARTHROPLASTY     Social History   Social History Narrative   Not on file   Immunization History  Administered Date(s) Administered   Fluad Quad(high Dose 65+) 05/16/2022   H1N1 05/18/2008   Influenza Inj Mdck Quad Pf 04/01/2017   Influenza Inj Mdck Quad With Preservative 04/13/2018   Influenza Split 03/23/2011, 03/14/2012, 03/23/2013, 03/22/2016   Influenza Whole 03/08/2008, 03/08/2009, 03/07/2010   Influenza,inj,Quad PF,6+ Mos 03/21/2014, 03/31/2019, 04/03/2021    PFIZER(Purple Top)SARS-COV-2 Vaccination 08/22/2019, 09/12/2019   PNEUMOCOCCAL CONJUGATE-20 11/20/2022   Pneumococcal Conjugate-13 06/28/2019   Td 01/06/2002   Tdap 03/14/2012   Zoster Recombinant(Shingrix) 03/08/2018     Objective: Vital Signs: BP 119/75 (BP Location: Left Arm, Patient Position: Sitting, Cuff Size: Normal)   Pulse 93   Resp 14   Ht 5\' 8"  (1.727 m)   Wt 228 lb (103.4 kg)   BMI 34.67 kg/m    Physical Exam Vitals and nursing note reviewed.  Constitutional:      Appearance: She is well-developed.  HENT:     Head: Normocephalic and atraumatic.  Eyes:     Conjunctiva/sclera: Conjunctivae normal.  Cardiovascular:     Rate and Rhythm: Normal rate and regular rhythm.     Heart sounds: Normal heart sounds.  Pulmonary:     Effort: Pulmonary effort is normal.     Breath sounds: Normal  breath sounds.  Abdominal:     General: Bowel sounds are normal.     Palpations: Abdomen is soft.  Musculoskeletal:     Cervical back: Normal range of motion.  Lymphadenopathy:     Cervical: No cervical adenopathy.  Skin:    General: Skin is warm and dry.     Capillary Refill: Capillary refill takes less than 2 seconds.  Neurological:     Mental Status: She is alert and oriented to person, place, and time.  Psychiatric:        Behavior: Behavior normal.      Musculoskeletal Exam: C-spine has limited range of motion with lateral rotation.  Good flexion extension of the C-spine.  Some trapezius muscle tension tenderness bilaterally.  Shoulder joints, elbow joints, wrist joints, MCPs, PIPs, DIPs have good range of motion with no synovitis.  Complete fist formation bilaterally.  Bilateral knee replacements have good range of motion with no warmth or effusion.  Ankle joints have good range of motion with no joint tenderness or synovitis.  No evidence of Achilles tendinitis or plantar fasciitis.  Overcrowding of toes noted.  No synovitis over MTP joints.  PIP and DIP thickening  consistent with osteoarthritis of both feet.  CDAI Exam: CDAI Score: -- Patient Global: --; Provider Global: -- Swollen: --; Tender: -- Joint Exam 01/19/2023   No joint exam has been documented for this visit   There is currently no information documented on the homunculus. Go to the Rheumatology activity and complete the homunculus joint exam.  Investigation: No additional findings.  Imaging: No results found.  Recent Labs: Lab Results  Component Value Date   WBC 2.8 (L) 11/17/2022   HGB 11.9 11/17/2022   PLT 126 (L) 11/17/2022   NA 140 11/17/2022   K 4.2 11/17/2022   CL 107 11/17/2022   CO2 25 11/17/2022   GLUCOSE 97 11/17/2022   BUN 12 11/17/2022   CREATININE 1.11 (H) 11/17/2022   BILITOT 0.5 11/17/2022   ALKPHOS 87 01/25/2017   AST 20 11/17/2022   ALT 16 11/17/2022   PROT 6.9 11/17/2022   ALBUMIN 4.2 10/13/2022   CALCIUM 9.2 11/17/2022   GFRAA 71 11/29/2020   QFTBGOLDPLUS NEGATIVE 02/25/2022    Speciality Comments: Prior therapy: Methotrexate (elevated creatinine),Arava-pt dcd  Procedures:  No procedures performed Allergies: Nucynta [tapentadol], Codeine, Penicillins, Tessalon [benzonatate], and Other   Assessment / Plan:     Visit Diagnoses: Psoriatic arthropathy (HCC): No synovitis or dactylitis noted on examination today.  She has not had any signs or symptoms of a psoriatic arthritis flare recently.  No evidence of Achilles tendinitis or plantar fasciitis.  No signs or symptoms of uveitis.  No active psoriasis at this time.  She experiences intermittent stiffness in the left SI joint but no nocturnal pain.  Overall she has been clinically doing well on Enbrel 50 mg sq injections once weekly.  She will remain on Enbrel as prescribed.  She was advised to notify us if she develops signs or symptoms of a flare.  She will follow-up in the office in 5 months or sooner if needed.  Psoriasis: No active psoriasis at this time.  High risk medication use - Enbrel 50  mg subcutaneous injections once weekly.  CBC and CMP updated on 11/17/22.  BMP updated on 12/18/2022 was within normal limits.  Her next lab work will be due in September and every 3 months.  Standing orders for CBC and CMP replaced today. TB gold negative on 02/25/22.  Future order for TB gold placed today.  No recent or recurrent infections.  Discussed the importance of holding enbrel if she develops signs or symptoms of an infection and to resume once the infection has completely cleared.  - Plan: QuantiFERON-TB Gold Plus, CBC with Differential/Platelet, COMPLETE METABOLIC PANEL WITH GFR  Screening for tuberculosis -Future order for TB Gold placed today.  Plan: QuantiFERON-TB Gold Plus  Left wrist tendonitis/ history of surgery for Dequervains  - Resolved.  Trochanteric bursitis, left hip: Not currently symptomatic.   Status post bilateral knee replacements: Doing well.  Good ROM with no discomfort.  No warmth or effusion noted.   Sacroiliitis [facility]): She experiences intermittent stiffness in the left SI joint.  No nocturnal pain.  Raynaud's disease without gangrene: Not currently symptomatic.  No signs of sclerodactyly noted.  No digital ulcerations noted.  Osteopenia of multiple sites - DEXA updated on 08/01/2021: Right femoral neck BMD 0.598 with T score of -2.3. She is taking vitamin D 1000 units daily. Due to update DEXA in February 2025.  History of hypertension: Blood pressure was 119/75 today in the office.  Other medical conditions are listed as follows:  History of gastroesophageal reflux (GERD)  History of ulcer of lower limb/ Venous stasis ulcer 01/2015   History of hypothyroidism  History of asthma  History of migraine  History of IBS  Primary insomnia  Hereditary and idiopathic peripheral neuropathy   Orders: Orders Placed This Encounter  Procedures   QuantiFERON-TB Gold Plus   CBC with Differential/Platelet   COMPLETE METABOLIC PANEL WITH GFR   No orders  of the defined types were placed in this encounter.     Follow-Up Instructions: Return in about 5 months (around 06/21/2023) for Psoriatic arthritis.   Gearldine Bienenstock, PA-C  Note - This record has been created using Dragon software.  Chart creation errors have been sought, but may not always  have been located. Such creation errors do not reflect on  the standard of medical care.

## 2023-01-19 ENCOUNTER — Ambulatory Visit: Payer: BC Managed Care – PPO | Attending: Rheumatology | Admitting: Physician Assistant

## 2023-01-19 ENCOUNTER — Encounter: Payer: Self-pay | Admitting: Physician Assistant

## 2023-01-19 VITALS — BP 119/75 | HR 93 | Resp 14 | Ht 68.0 in | Wt 228.0 lb

## 2023-01-19 DIAGNOSIS — Z111 Encounter for screening for respiratory tuberculosis: Secondary | ICD-10-CM

## 2023-01-19 DIAGNOSIS — M8589 Other specified disorders of bone density and structure, multiple sites: Secondary | ICD-10-CM

## 2023-01-19 DIAGNOSIS — G609 Hereditary and idiopathic neuropathy, unspecified: Secondary | ICD-10-CM

## 2023-01-19 DIAGNOSIS — L4050 Arthropathic psoriasis, unspecified: Secondary | ICD-10-CM

## 2023-01-19 DIAGNOSIS — Z872 Personal history of diseases of the skin and subcutaneous tissue: Secondary | ICD-10-CM

## 2023-01-19 DIAGNOSIS — Z8719 Personal history of other diseases of the digestive system: Secondary | ICD-10-CM

## 2023-01-19 DIAGNOSIS — M778 Other enthesopathies, not elsewhere classified: Secondary | ICD-10-CM

## 2023-01-19 DIAGNOSIS — Z8639 Personal history of other endocrine, nutritional and metabolic disease: Secondary | ICD-10-CM

## 2023-01-19 DIAGNOSIS — L409 Psoriasis, unspecified: Secondary | ICD-10-CM | POA: Diagnosis not present

## 2023-01-19 DIAGNOSIS — I7300 Raynaud's syndrome without gangrene: Secondary | ICD-10-CM

## 2023-01-19 DIAGNOSIS — Z8679 Personal history of other diseases of the circulatory system: Secondary | ICD-10-CM

## 2023-01-19 DIAGNOSIS — Z8709 Personal history of other diseases of the respiratory system: Secondary | ICD-10-CM

## 2023-01-19 DIAGNOSIS — Z79899 Other long term (current) drug therapy: Secondary | ICD-10-CM

## 2023-01-19 DIAGNOSIS — Z96653 Presence of artificial knee joint, bilateral: Secondary | ICD-10-CM

## 2023-01-19 DIAGNOSIS — F5101 Primary insomnia: Secondary | ICD-10-CM

## 2023-01-19 DIAGNOSIS — Z8669 Personal history of other diseases of the nervous system and sense organs: Secondary | ICD-10-CM

## 2023-01-19 DIAGNOSIS — M7062 Trochanteric bursitis, left hip: Secondary | ICD-10-CM

## 2023-01-19 DIAGNOSIS — M461 Sacroiliitis, not elsewhere classified: Secondary | ICD-10-CM

## 2023-01-19 NOTE — Patient Instructions (Signed)
Standing Labs We placed an order today for your standing lab work.   Please have your standing labs drawn in September and every 3 months   Please have your labs drawn 2 weeks prior to your appointment so that the provider can discuss your lab results at your appointment, if possible.  Please note that you may see your imaging and lab results in MyChart before we have reviewed them. We will contact you once all results are reviewed. Please allow our office up to 72 hours to thoroughly review all of the results before contacting the office for clarification of your results.  WALK-IN LAB HOURS  Monday through Thursday from 8:00 am -12:30 pm and 1:00 pm-5:00 pm and Friday from 8:00 am-12:00 pm.  Patients with office visits requiring labs will be seen before walk-in labs.  You may encounter longer than normal wait times. Please allow additional time. Wait times may be shorter on  Monday and Thursday afternoons.  We do not book appointments for walk-in labs. We appreciate your patience and understanding with our staff.   Labs are drawn by [facility]. Please bring your co-pay at the time of your lab draw.  You may receive a bill from [facility] for your lab work.  Please note if you are on Hydroxychloroquine and and an order has been placed for a Hydroxychloroquine level,  you will need to have it drawn 4 hours or more after your last dose.  If you wish to have your labs drawn at another location, please call the office 24 hours in advance so we can fax the orders.  The office is located at [address], [address], [place], [place] [address]   If you have any questions regarding directions or hours of operation,  please call [phone].   As a reminder, please drink plenty of water prior to coming for your lab work. Thanks!  

## 2023-01-20 ENCOUNTER — Other Ambulatory Visit: Payer: Self-pay

## 2023-01-21 ENCOUNTER — Other Ambulatory Visit: Payer: Self-pay

## 2023-01-29 ENCOUNTER — Other Ambulatory Visit (HOSPITAL_COMMUNITY): Payer: Self-pay

## 2023-02-05 ENCOUNTER — Other Ambulatory Visit: Payer: Self-pay

## 2023-02-05 ENCOUNTER — Other Ambulatory Visit: Payer: Self-pay | Admitting: Internal Medicine

## 2023-02-05 DIAGNOSIS — L4050 Arthropathic psoriasis, unspecified: Secondary | ICD-10-CM

## 2023-02-05 NOTE — Telephone Encounter (Signed)
Last Fill: 10/30/2022  Labs: 12/18/2022 BMP WNL  11/17/2022 creat 1.11, eGFR 54, WBC 2.8, platelets 126, neutro abs 680  TB Gold: 02/25/2022 negative    Next Visit: 06/24/2023  Last Visit: 01/19/2023  DX: Psoriatic arthropathy   Current Dose per office note on 01/19/2023: Enbrel 50 mg subcutaneous injections once weekly.   Okay to refill Enbrel?

## 2023-02-06 ENCOUNTER — Other Ambulatory Visit (HOSPITAL_BASED_OUTPATIENT_CLINIC_OR_DEPARTMENT_OTHER): Payer: Self-pay

## 2023-02-06 MED ORDER — ENBREL MINI 50 MG/ML ~~LOC~~ SOCT
50.0000 mg | SUBCUTANEOUS | 0 refills | Status: DC
  Filled 2023-02-06: qty 4, 28d supply, fill #0
  Filled 2023-02-25: qty 4, 28d supply, fill #1
  Filled 2023-04-02: qty 4, 28d supply, fill #2

## 2023-02-09 ENCOUNTER — Other Ambulatory Visit (HOSPITAL_COMMUNITY): Payer: Self-pay

## 2023-02-09 ENCOUNTER — Other Ambulatory Visit: Payer: Self-pay

## 2023-02-16 ENCOUNTER — Other Ambulatory Visit (HOSPITAL_COMMUNITY): Payer: Self-pay

## 2023-02-18 ENCOUNTER — Other Ambulatory Visit: Payer: Self-pay

## 2023-02-23 ENCOUNTER — Other Ambulatory Visit: Payer: Self-pay

## 2023-02-24 ENCOUNTER — Other Ambulatory Visit (HOSPITAL_COMMUNITY): Payer: Self-pay

## 2023-02-24 ENCOUNTER — Encounter (HOSPITAL_COMMUNITY): Payer: Self-pay | Admitting: Pharmacist

## 2023-02-24 DIAGNOSIS — E039 Hypothyroidism, unspecified: Secondary | ICD-10-CM | POA: Diagnosis not present

## 2023-02-25 ENCOUNTER — Other Ambulatory Visit (HOSPITAL_COMMUNITY): Payer: Self-pay

## 2023-03-01 DIAGNOSIS — F411 Generalized anxiety disorder: Secondary | ICD-10-CM | POA: Diagnosis not present

## 2023-03-01 DIAGNOSIS — F902 Attention-deficit hyperactivity disorder, combined type: Secondary | ICD-10-CM | POA: Diagnosis not present

## 2023-03-01 DIAGNOSIS — F422 Mixed obsessional thoughts and acts: Secondary | ICD-10-CM | POA: Diagnosis not present

## 2023-03-01 DIAGNOSIS — F331 Major depressive disorder, recurrent, moderate: Secondary | ICD-10-CM | POA: Diagnosis not present

## 2023-03-02 ENCOUNTER — Encounter (HOSPITAL_COMMUNITY): Payer: Self-pay | Admitting: Pharmacist

## 2023-03-02 ENCOUNTER — Other Ambulatory Visit (HOSPITAL_COMMUNITY): Payer: Self-pay

## 2023-03-02 MED ORDER — AMPHETAMINE-DEXTROAMPHET ER 20 MG PO CP24
20.0000 mg | ORAL_CAPSULE | Freq: Two times a day (BID) | ORAL | 0 refills | Status: DC
  Filled 2023-04-26: qty 60, 30d supply, fill #0

## 2023-03-02 MED ORDER — AMPHETAMINE-DEXTROAMPHET ER 20 MG PO CP24
20.0000 mg | ORAL_CAPSULE | Freq: Two times a day (BID) | ORAL | 0 refills | Status: DC
  Filled 2023-03-02: qty 60, 30d supply, fill #0

## 2023-03-03 ENCOUNTER — Other Ambulatory Visit: Payer: Self-pay

## 2023-03-04 ENCOUNTER — Other Ambulatory Visit (HOSPITAL_COMMUNITY): Payer: Self-pay

## 2023-03-05 ENCOUNTER — Other Ambulatory Visit: Payer: Self-pay

## 2023-03-31 ENCOUNTER — Other Ambulatory Visit: Payer: Self-pay

## 2023-04-02 ENCOUNTER — Other Ambulatory Visit (HOSPITAL_COMMUNITY): Payer: Self-pay | Admitting: Pharmacy Technician

## 2023-04-02 ENCOUNTER — Other Ambulatory Visit (HOSPITAL_COMMUNITY): Payer: Self-pay

## 2023-04-02 ENCOUNTER — Other Ambulatory Visit: Payer: Self-pay

## 2023-04-02 NOTE — Progress Notes (Signed)
Specialty Pharmacy Ongoing Clinical Assessment Note  Sarah Horton is a 72 y.o. female who is being followed by the specialty pharmacy service for RxSp Psoriatic Arthritis   Patient's specialty medication(s) reviewed today: Etanercept   Missed doses in the last 4 weeks: 0   Patient/Caregiver did not have any additional questions or concerns.   Therapeutic benefit summary: Patient is achieving benefit   Adverse events/side effects summary: No adverse events/side effects   Patient's therapy is appropriate to: Continue    Goals Addressed             This Visit's Progress    Reduce signs and symptoms       Patient is on track. Patient will maintain adherence. Patient states medication continues to work very well to treat her symptoms.          Follow up:  6 months  Otto Herb Specialty Pharmacist

## 2023-04-02 NOTE — Progress Notes (Signed)
Specialty Pharmacy Refill Coordination Note  Sarah Horton is a 72 y.o. female contacted today regarding refills of specialty medication(s) Etanercept   Patient requested Delivery   Delivery date: 04/06/23   Verified address: [address] [place]   Medication will be filled on 04/05/23.

## 2023-04-05 ENCOUNTER — Ambulatory Visit: Payer: BC Managed Care – PPO | Admitting: Internal Medicine

## 2023-04-05 ENCOUNTER — Encounter: Payer: Self-pay | Admitting: Internal Medicine

## 2023-04-05 ENCOUNTER — Other Ambulatory Visit (HOSPITAL_COMMUNITY): Payer: Self-pay

## 2023-04-05 VITALS — BP 110/70 | HR 94 | Temp 98.8°F | Ht 68.0 in | Wt 228.0 lb

## 2023-04-05 DIAGNOSIS — I10 Essential (primary) hypertension: Secondary | ICD-10-CM | POA: Diagnosis not present

## 2023-04-05 DIAGNOSIS — J0100 Acute maxillary sinusitis, unspecified: Secondary | ICD-10-CM

## 2023-04-05 DIAGNOSIS — J22 Unspecified acute lower respiratory infection: Secondary | ICD-10-CM | POA: Diagnosis not present

## 2023-04-05 DIAGNOSIS — L4050 Arthropathic psoriasis, unspecified: Secondary | ICD-10-CM | POA: Diagnosis not present

## 2023-04-05 DIAGNOSIS — N1831 Chronic kidney disease, stage 3a: Secondary | ICD-10-CM

## 2023-04-05 MED ORDER — AZITHROMYCIN 250 MG PO TABS
ORAL_TABLET | ORAL | 0 refills | Status: AC
  Filled 2023-04-05: qty 6, 5d supply, fill #0

## 2023-04-05 MED ORDER — CEFTRIAXONE SODIUM 1 G IJ SOLR
1.0000 g | Freq: Once | INTRAMUSCULAR | Status: AC
Start: 2023-04-05 — End: 2023-04-05
  Administered 2023-04-05: 1 g via INTRAMUSCULAR

## 2023-04-05 MED ORDER — ALBUTEROL SULFATE HFA 108 (90 BASE) MCG/ACT IN AERS
2.0000 | INHALATION_SPRAY | Freq: Four times a day (QID) | RESPIRATORY_TRACT | 99 refills | Status: AC | PRN
  Filled 2023-04-05: qty 6.7, 25d supply, fill #0
  Filled 2023-07-28: qty 6.7, 25d supply, fill #1
  Filled 2023-12-26: qty 6.7, 25d supply, fill #2

## 2023-04-05 NOTE — Patient Instructions (Addendum)
Given Rocephin 1 g IM.  Take Zithromax Z-PAK 2 tabs day 1 followed by 1 tab days 2 through 5.  Rest and stay well-hydrated.  Refilled albuterol inhaler.  Contact us if not better in 48 to 72 hours or sooner if worse.  Chronic kidney disease and essential hypertension are stable.

## 2023-04-05 NOTE — Progress Notes (Addendum)
Patient Care Team: Margaree Mackintosh, MD as PCP - General (Internal Medicine) Evlyn Kanner, MD (Inactive) as Consulting Physician (Surgery)  Visit Date: 04/05/23  Subjective:    Patient ID: Sarah Horton , Female   DOB: 02/20/1951, 72 y.o.    MRN: [id number]   72 y.o. Female presents today for cough with brown sputum, chest congestion. Symptoms started with cough on 03/15/23. Denies fever, chills. Has been taking guaifenesin, cough drops, dextromethorphan without relief. She is using her albuterol inhaler. She is taking Enbrel for arthropathy. She has been exposed to sick people.  Past Medical History:  Diagnosis Date   Allergic rhinitis, cause unspecified    Allergy    Anemia, unspecified    Anxiety state, unspecified    Arthritis    Cataract    bilaterally removed 2003,2007   Esophageal reflux    Irritable bowel syndrome    Migraine    Obesity, unspecified    Psoriatic arthropathy (HCC)    Unspecified chronic bronchitis (HCC)    Unspecified hereditary and idiopathic peripheral neuropathy    Unspecified hypothyroidism    Unspecified venous (peripheral) insufficiency      Family History  Problem Relation Age of Onset   COPD Mother    Heart disease Mother    Hypertension Mother    Varicose Veins Mother    Cirrhosis Father    Thyroid disease Sister    Breast cancer Maternal Grandmother    Breast cancer Other        1st cousin    Pancreatic cancer Other        1st cousin    Lymphoma Cousin    Colon cancer Neg Hx    Colon polyps Neg Hx    Esophageal cancer Neg Hx    Rectal cancer Neg Hx    Stomach cancer Neg Hx     Social [id number]. 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 Husband has multiple medical issues. They reside in [place]     Review of Systems  Constitutional:  Negative for chills, fever and malaise/fatigue.  HENT:  Positive for congestion (Lower).   Eyes:  Negative for blurred vision.  Respiratory:   Positive for cough and sputum production Manson Passey). Negative for shortness of breath.   Cardiovascular:  Negative for chest pain, palpitations and leg swelling.  Gastrointestinal:  Negative for vomiting.  Musculoskeletal:  Negative for back pain.  Skin:  Negative for rash.  Neurological:  Negative for loss of consciousness and headaches.        Objective:   Vitals: BP 110/70   Pulse 94   Temp 98.8 F (37.1 C)   Ht 5\' 8"  (1.727 m)   Wt 228 lb (103.4 kg)   SpO2 97%   BMI 34.67 kg/m    Physical Exam Vitals and nursing note reviewed.  Constitutional:      General: She is not in acute distress.    Appearance: Normal appearance. She is not toxic-appearing.  HENT:     Head: Normocephalic and atraumatic.     Right Ear: Hearing, ear canal and external ear normal.     Left Ear: Hearing, ear canal and external ear normal.     Ears:     Comments: Left TM full, pink. Right TM slightly full.    Mouth/Throat:     Comments: Pharynx slightly injected without exudate. Pulmonary:     Effort: Pulmonary effort is normal.     Comments: Congested cough. Coarse breath  sounds LLL. Skin:    General: Skin is warm and dry.  Neurological:     Mental Status: She is alert and oriented to person, place, and time. Mental status is at baseline.  Psychiatric:        Mood and Affect: Mood normal.        Behavior: Behavior normal.        Thought Content: Thought content normal.        Judgment: Judgment normal.       Results:   Studies obtained and personally reviewed by me:   Labs:       Component Value Date/Time   NA 140 11/17/2022 1109   NA 140 10/13/2022 0000   K 4.2 11/17/2022 1109   CL 107 11/17/2022 1109   CO2 25 11/17/2022 1109   GLUCOSE 97 11/17/2022 1109   GLUCOSE 90 04/16/2006 1125   BUN 12 11/17/2022 1109   BUN 14 10/13/2022 0000   CREATININE 1.11 (H) 11/17/2022 1109   CALCIUM 9.2 11/17/2022 1109   PROT 6.9 11/17/2022 1109   PROT 6.7 09/08/2016 1648   ALBUMIN 4.2  10/13/2022 0000   ALBUMIN 4.2 09/08/2016 1648   AST 20 11/17/2022 1109   ALT 16 11/17/2022 1109   ALKPHOS 87 01/25/2017 1617   BILITOT 0.5 11/17/2022 1109   BILITOT 0.3 09/08/2016 1648   GFRNONAA 61 11/29/2020 1155   GFRAA 71 11/29/2020 1155     Lab Results  Component Value Date   WBC 2.8 (L) 11/17/2022   HGB 11.9 11/17/2022   HCT 35.8 11/17/2022   MCV 81.4 11/17/2022   PLT 126 (L) 11/17/2022    Lab Results  Component Value Date   CHOL 170 11/17/2022   HDL 57 11/17/2022   LDLCALC 91 11/17/2022   TRIG 128 11/17/2022   CHOLHDL 3.0 11/17/2022    Lab Results  Component Value Date   HGBA1C 5.0 04/01/2021     Lab Results  Component Value Date   TSH 1.83 11/17/2022      Assessment & Plan:   Acute lower respiratory infection / Sinusitis: prescribed Z-Pak two tabs day 1 followed by one tab days 2-5. Administered Rocephin 1 g IM. Refilled albuterol inhaler. Get plenty of rest and stay well-hydrated. Contact us if symptoms worsen or fail to improve.  Stage IIIa chronic kidney disease seen by [facility] and stable  Essential hypertension stable on current regimen  I,Alexander Ruley,acting as a scribe for Margaree Mackintosh, MD.,have documented all relevant documentation on the behalf of Margaree Mackintosh, MD,as directed by  Margaree Mackintosh, MD while in the presence of Margaree Mackintosh, MD.   I, Margaree Mackintosh, MD, have reviewed all documentation for this visit. The documentation on 04/05/23 for the exam, diagnosis, procedures, and orders are all accurate and complete.

## 2023-04-12 DIAGNOSIS — N1832 Chronic kidney disease, stage 3b: Secondary | ICD-10-CM | POA: Diagnosis not present

## 2023-04-14 ENCOUNTER — Other Ambulatory Visit (HOSPITAL_COMMUNITY): Payer: Self-pay

## 2023-04-14 ENCOUNTER — Other Ambulatory Visit: Payer: Self-pay

## 2023-04-14 MED ORDER — METOPROLOL SUCCINATE ER 25 MG PO TB24
25.0000 mg | ORAL_TABLET | Freq: Every day | ORAL | 5 refills | Status: DC
  Filled 2023-04-14: qty 30, 30d supply, fill #0
  Filled 2023-05-11: qty 30, 30d supply, fill #1
  Filled 2023-06-07: qty 30, 30d supply, fill #2
  Filled 2023-07-06 – 2023-07-12 (×3): qty 30, 30d supply, fill #3
  Filled 2023-08-12: qty 30, 30d supply, fill #4
  Filled 2023-09-22: qty 30, 30d supply, fill #5

## 2023-04-15 DIAGNOSIS — E039 Hypothyroidism, unspecified: Secondary | ICD-10-CM | POA: Diagnosis not present

## 2023-04-15 DIAGNOSIS — R000 Tachycardia, unspecified: Secondary | ICD-10-CM | POA: Diagnosis not present

## 2023-04-15 DIAGNOSIS — N1832 Chronic kidney disease, stage 3b: Secondary | ICD-10-CM | POA: Diagnosis not present

## 2023-04-15 DIAGNOSIS — I129 Hypertensive chronic kidney disease with stage 1 through stage 4 chronic kidney disease, or unspecified chronic kidney disease: Secondary | ICD-10-CM | POA: Diagnosis not present

## 2023-04-26 ENCOUNTER — Other Ambulatory Visit (HOSPITAL_COMMUNITY): Payer: Self-pay

## 2023-04-28 ENCOUNTER — Other Ambulatory Visit: Payer: Self-pay | Admitting: *Deleted

## 2023-04-28 ENCOUNTER — Other Ambulatory Visit (HOSPITAL_COMMUNITY): Payer: Self-pay

## 2023-04-28 ENCOUNTER — Other Ambulatory Visit: Payer: Self-pay

## 2023-04-28 DIAGNOSIS — Z79899 Other long term (current) drug therapy: Secondary | ICD-10-CM

## 2023-04-28 DIAGNOSIS — Z111 Encounter for screening for respiratory tuberculosis: Secondary | ICD-10-CM

## 2023-04-28 MED ORDER — CLONIDINE HCL 0.1 MG PO TABS
0.1000 mg | ORAL_TABLET | Freq: Every day | ORAL | 2 refills | Status: DC
  Filled 2023-04-28 (×2): qty 90, 90d supply, fill #0
  Filled 2023-07-24: qty 90, 90d supply, fill #1
  Filled 2023-10-19: qty 90, 90d supply, fill #2

## 2023-04-30 ENCOUNTER — Other Ambulatory Visit: Payer: Self-pay

## 2023-04-30 ENCOUNTER — Other Ambulatory Visit (HOSPITAL_COMMUNITY): Payer: Self-pay

## 2023-04-30 ENCOUNTER — Other Ambulatory Visit: Payer: Self-pay | Admitting: Rheumatology

## 2023-04-30 DIAGNOSIS — Z79899 Other long term (current) drug therapy: Secondary | ICD-10-CM | POA: Diagnosis not present

## 2023-04-30 DIAGNOSIS — L4050 Arthropathic psoriasis, unspecified: Secondary | ICD-10-CM

## 2023-04-30 DIAGNOSIS — Z111 Encounter for screening for respiratory tuberculosis: Secondary | ICD-10-CM | POA: Diagnosis not present

## 2023-04-30 NOTE — Progress Notes (Signed)
Specialty Pharmacy Refill Coordination Note  ELODA CEDOTAL is a 72 y.o. female contacted today regarding refills of specialty medication(s) No data recorded  Patient requested Delivery   Delivery date: 05/05/23   Verified address: [address] [place], [place] [address]   Medication will be filled on 05/04/23. *Pending Refill Request* Left patient voicemail - sent refill request to provider; will call if any delays.

## 2023-04-30 NOTE — Progress Notes (Signed)
04/30/2023- Refill Request denied Reason: Patient should contact provider first. Spoke with patient & aware states she has labs scheduled

## 2023-05-03 ENCOUNTER — Other Ambulatory Visit: Payer: Self-pay | Admitting: *Deleted

## 2023-05-03 ENCOUNTER — Other Ambulatory Visit: Payer: Self-pay

## 2023-05-03 DIAGNOSIS — L4050 Arthropathic psoriasis, unspecified: Secondary | ICD-10-CM

## 2023-05-03 MED ORDER — ENBREL MINI 50 MG/ML ~~LOC~~ SOCT
50.0000 mg | SUBCUTANEOUS | 0 refills | Status: DC
  Filled 2023-05-03: qty 4, 28d supply, fill #0
  Filled 2023-05-28: qty 4, 28d supply, fill #1

## 2023-05-03 NOTE — Progress Notes (Signed)
Creatinine is borderline elevated-1.08-improved.  GFR is low-56. Avoid the use of NSAIDs.  CBC stable-WBC count remains slightly low but has improved. Absolute neutrophils are borderline low but improving.   Plt count remains low but stable.

## 2023-05-03 NOTE — Telephone Encounter (Signed)
Last Fill: 02/06/2023  Labs: 04/30/2023 Creatinine is borderline elevated-1.08-improved.  GFR is low-56. Avoid the use of NSAIDs. CBC stable-WBC count remains slightly low but has improved. Absolute neutrophils are borderline low but improving.   Plt count remains low but stable.  TB Gold: 02/25/2022 Neg (updated 04/30/2023, results are pending)    Next Visit: 06/24/2023  Last Visit: 01/19/2023  DX: Psoriatic arthropathy   Current Dose per office note 01/19/2023: Enbrel 50 mg subcutaneous injections once weekly.   Okay to refill Enbrel?

## 2023-05-04 ENCOUNTER — Other Ambulatory Visit: Payer: Self-pay

## 2023-05-04 LAB — COMPLETE METABOLIC PANEL WITH GFR
AG Ratio: 1.2 (calc) (ref 1.0–2.5)
ALT: 15 U/L (ref 6–29)
AST: 20 U/L (ref 10–35)
Albumin: 3.9 g/dL (ref 3.6–5.1)
Alkaline phosphatase (APISO): 83 U/L (ref 37–153)
BUN/Creatinine Ratio: 12 (calc) (ref 6–22)
BUN: 13 mg/dL (ref 7–25)
CO2: 24 mmol/L (ref 20–32)
Calcium: 9.1 mg/dL (ref 8.6–10.4)
Chloride: 106 mmol/L (ref 98–110)
Creat: 1.08 mg/dL — ABNORMAL HIGH (ref 0.50–1.05)
Globulin: 3.2 g/dL (ref 1.9–3.7)
Glucose, Bld: 96 mg/dL (ref 65–99)
Potassium: 4.2 mmol/L (ref 3.5–5.3)
Sodium: 140 mmol/L (ref 135–146)
Total Bilirubin: 0.8 mg/dL (ref 0.2–1.2)
Total Protein: 7.1 g/dL (ref 6.1–8.1)
eGFR: 56 mL/min/{1.73_m2} — ABNORMAL LOW (ref >=60)

## 2023-05-04 LAB — CBC WITH DIFFERENTIAL/PLATELET
Absolute Lymphocytes: 2084 {cells}/uL (ref 850–3900)
Absolute Monocytes: 432 {cells}/uL (ref 200–950)
Basophils Absolute: 22 {cells}/uL (ref 0–200)
Basophils Relative: 0.6 %
Eosinophils Absolute: 50 {cells}/uL (ref 15–500)
Eosinophils Relative: 1.4 %
HCT: 36.7 % (ref 35.0–45.0)
Hemoglobin: 12 g/dL (ref 11.7–15.5)
MCH: 26.8 pg — ABNORMAL LOW (ref 27.0–33.0)
MCHC: 32.7 g/dL (ref 32.0–36.0)
MCV: 81.9 fL (ref 80.0–100.0)
MPV: 11.7 fL (ref 7.5–12.5)
Monocytes Relative: 12 %
Neutro Abs: 1012 {cells}/uL — ABNORMAL LOW (ref 1500–7800)
Neutrophils Relative %: 28.1 %
Platelets: 132 10*3/uL — ABNORMAL LOW (ref 140–400)
RBC: 4.48 10*6/uL (ref 3.80–5.10)
RDW: 14.7 % (ref 11.0–15.0)
Total Lymphocyte: 57.9 %
WBC: 3.6 10*3/uL — ABNORMAL LOW (ref 3.8–10.8)

## 2023-05-04 LAB — QUANTIFERON-TB GOLD PLUS
Mitogen-NIL: 5.91 [IU]/mL
NIL: 0.03 [IU]/mL
QuantiFERON-TB Gold Plus: NEGATIVE
TB1-NIL: <0 [IU]/mL
TB2-NIL: <0 [IU]/mL

## 2023-05-04 NOTE — Progress Notes (Signed)
TB gold negative

## 2023-05-12 ENCOUNTER — Other Ambulatory Visit: Payer: Self-pay

## 2023-05-12 ENCOUNTER — Other Ambulatory Visit (HOSPITAL_COMMUNITY): Payer: Self-pay

## 2023-05-12 MED ORDER — SODIUM BICARBONATE 650 MG PO TABS
650.0000 mg | ORAL_TABLET | Freq: Two times a day (BID) | ORAL | 5 refills | Status: DC
  Filled 2023-05-12: qty 30, 15d supply, fill #0
  Filled 2023-05-21: qty 30, 15d supply, fill #1
  Filled 2023-06-05: qty 30, 15d supply, fill #2
  Filled 2023-06-21: qty 30, 15d supply, fill #3
  Filled 2023-07-02: qty 30, 15d supply, fill #4
  Filled 2023-07-15 – 2023-07-16 (×2): qty 30, 15d supply, fill #5

## 2023-05-17 ENCOUNTER — Other Ambulatory Visit (HOSPITAL_COMMUNITY): Payer: Self-pay

## 2023-05-17 DIAGNOSIS — F331 Major depressive disorder, recurrent, moderate: Secondary | ICD-10-CM | POA: Diagnosis not present

## 2023-05-17 DIAGNOSIS — F902 Attention-deficit hyperactivity disorder, combined type: Secondary | ICD-10-CM | POA: Diagnosis not present

## 2023-05-17 DIAGNOSIS — F422 Mixed obsessional thoughts and acts: Secondary | ICD-10-CM | POA: Diagnosis not present

## 2023-05-17 DIAGNOSIS — F411 Generalized anxiety disorder: Secondary | ICD-10-CM | POA: Diagnosis not present

## 2023-05-17 MED ORDER — AMPHETAMINE-DEXTROAMPHET ER 20 MG PO CP24
20.0000 mg | ORAL_CAPSULE | Freq: Two times a day (BID) | ORAL | 0 refills | Status: DC
  Filled 2023-07-12: qty 60, 30d supply, fill #0

## 2023-05-17 MED ORDER — AMPHETAMINE-DEXTROAMPHET ER 20 MG PO CP24
20.0000 mg | ORAL_CAPSULE | Freq: Two times a day (BID) | ORAL | 0 refills | Status: DC
  Filled 2023-05-27: qty 60, 30d supply, fill #0

## 2023-05-21 ENCOUNTER — Other Ambulatory Visit (HOSPITAL_COMMUNITY): Payer: Self-pay

## 2023-05-25 ENCOUNTER — Other Ambulatory Visit (HOSPITAL_COMMUNITY): Payer: Self-pay

## 2023-05-25 ENCOUNTER — Ambulatory Visit: Payer: BC Managed Care – PPO | Attending: Cardiology | Admitting: Cardiology

## 2023-05-25 ENCOUNTER — Encounter: Payer: Self-pay | Admitting: Cardiology

## 2023-05-25 ENCOUNTER — Other Ambulatory Visit: Payer: Self-pay

## 2023-05-25 VITALS — BP 138/88 | HR 83 | Resp 16 | Ht 68.0 in | Wt 235.0 lb

## 2023-05-25 DIAGNOSIS — I10 Essential (primary) hypertension: Secondary | ICD-10-CM | POA: Diagnosis not present

## 2023-05-25 DIAGNOSIS — E6609 Other obesity due to excess calories: Secondary | ICD-10-CM

## 2023-05-25 DIAGNOSIS — E781 Pure hyperglyceridemia: Secondary | ICD-10-CM

## 2023-05-25 DIAGNOSIS — R072 Precordial pain: Secondary | ICD-10-CM | POA: Diagnosis not present

## 2023-05-25 DIAGNOSIS — R0602 Shortness of breath: Secondary | ICD-10-CM | POA: Diagnosis not present

## 2023-05-25 DIAGNOSIS — E039 Hypothyroidism, unspecified: Secondary | ICD-10-CM | POA: Diagnosis not present

## 2023-05-25 DIAGNOSIS — Z6835 Body mass index (BMI) 35.0-35.9, adult: Secondary | ICD-10-CM

## 2023-05-25 DIAGNOSIS — E66812 Obesity, class 2: Secondary | ICD-10-CM

## 2023-05-25 MED ORDER — METOPROLOL TARTRATE 25 MG PO TABS
ORAL_TABLET | ORAL | 0 refills | Status: DC
  Filled 2023-05-25: qty 14, 7d supply, fill #0

## 2023-05-25 NOTE — Patient Instructions (Signed)
Medication Instructions:   PLEASE HOLD YOUR METOPROLOL SUCCINATE (TOPROL XL) THE WEEK OF YOUR CARDIAC CT AND TAKE SHORT ACTING METOPROLOL TARTRATE 25 MG TWICE DAILY INSTEAD THAT WEEK.  YOU WILL RESUME BACK TAKING YOUR REGULAR METOPROLOL SUCCINATE (TOPROL XL) THE FOLLOWING DAY AFTER YOUR CARDIAC CT IS COMPLETE  *If you need a refill on your cardiac medications before your next appointment, please call your pharmacy*    Testing/Procedures:  Your physician has requested that you have an echocardiogram. Echocardiography is a painless test that uses sound waves to create images of your heart. It provides your doctor with information about the size and shape of your heart and how well your heart's chambers and valves are working. This procedure takes approximately one hour. There are no restrictions for this procedure. Please do NOT wear cologne, perfume, aftershave, or lotions (deodorant is allowed). Please arrive 15 minutes prior to your appointment time.  Please note: We ask at that you not bring children with you during ultrasound (echo/ vascular) testing. Due to room size and safety concerns, children are not allowed in the ultrasound rooms during exams. Our front office staff cannot provide observation of children in our lobby area while testing is being conducted. An adult accompanying a patient to their appointment will only be allowed in the ultrasound room at the discretion of the ultrasound technician under special circumstances. We apologize for any inconvenience.      Your cardiac CT will be scheduled at one of the below locations:   [facility] [address] [place], [place] [address] [phone]  If scheduled at [facility], please arrive at the [facility] and Children's Entrance (Entrance C2) of [facility] 30 minutes prior to test start time. You can use the FREE valet parking offered at entrance C (encouraged to control the heart rate for the test)   Proceed to the [facility] Radiology Department (first floor) to check-in and test prep.  All radiology patients and guests should use entrance C2 at [facility], accessed from [facility], even though the hospital's physical address listed is [address].     Please follow these instructions carefully (unless otherwise directed):    On the Night Before the Test: Be sure to Drink plenty of water. Do not consume any caffeinated/decaffeinated beverages or chocolate 12 hours prior to your test. Do not take any antihistamines 12 hours prior to your test.   On the Day of the Test: Drink plenty of water until 1 hour prior to the test. Do not eat any food 1 hour prior to test. You may take your regular medications prior to the test.  Take metoprolol (Lopressor) 25 MG BY MOUTH TWICE DAILY FOR ONE WEEK LEADING UP TO YOUR CARDIAC CT--YOU WILL HOLD YOUR REGULAR METOPROLOL SUCCINATE (TOPROL XL) THAT WEEK, AND RESUME BACK TAKING THIS AFTER YOUR CARDIAC CT IS COMPLETE    FEMALES- please wear underwire-free bra if available, avoid dresses & tight clothing        After the Test: Drink plenty of water. After receiving IV contrast, you may experience a mild flushed feeling. This is normal. On occasion, you may experience a mild rash up to 24 hours after the test. This is not dangerous. If this occurs, you can take Benadryl 25 mg and increase your fluid intake. If you experience trouble breathing, this can be serious. If it is severe call 911 IMMEDIATELY. If it is mild, please call our office.  We  will call to schedule your test 2-4 weeks out understanding that some insurance companies will need an authorization prior to the service being performed.   For more information and frequently asked questions, please visit our website : [email or web address]  For non-scheduling related questions, please contact the cardiac imaging nurse navigator should you  have any questions/concerns: Cardiac Imaging Nurse Navigators Direct Office Dial: [phone]   For scheduling needs, including cancellations and rescheduling, please call [place], [phone].    Follow-Up:  1.)  8 WEEKS WITH AN EXTENDER IN THE OFFICE  2.)  6 MONTHS WITH DR. Odis Hollingshead

## 2023-05-25 NOTE — Progress Notes (Signed)
Cardiology Office Note:    Date:  05/25/2023  NAME:  LESHIA Horton    MRN: [id number] DOB:  1950/08/30   PCP:  Margaree Mackintosh, MD  Former Cardiology Providers: None Primary Cardiologist:  None, FACC (established care 05/25/2023) Electrophysiologist:  None   Referring MD: Margaree Mackintosh, MD  Reason of Consult: Shortness of breath/arrhythmia  Chief Complaint  Patient presents with   Shortness of Breath   New Patient (Initial Visit)    History of Present Illness:    Sarah Horton is a 72 y.o.  female whose past medical history and cardiovascular risk factors includes: Psoriatic arthritis, hypothyroidism, hypertension, hypertriglyceridemia, venous ablation bilateral. She is being seen today for the evaluation of shortness of breath/leg at the request of Baxley, Luanna Cole, MD.  Patient was referred to the practice for evaluation of shortness of breath/questionable arrhythmia.  She has had a long history of ADHD and has been on Adderall since 2013 as well as clonidine 0.1 mg p.o. daily both of these medications are currently being prescribed by [facility].  Shortness of breath: Ongoing for years. Now it is more pronounced according to the patient. More noticeable with effort related activities-walking from the parking lot to our office, flight of stairs, lifting heavy objects. When she has dyspnea with effort related activities she does not have any anginal chest pain.  However, review of systems are positive for chest pain which she describes as throat closing discomfort which radiates down to the substernal region and left-sided, she has been attributing that to esophageal spasms.  These episodes occur once every 4 to 5 months.  No chest pain at rest or with effort related activities.  Factors to consider: History of thyroid disease: Currently being treated for hypothyroidism History of anemia: No, per patient Coffee consumption: 1 cup/day Alcohol use: Socially  drinks alcohol Stimulants: Adderall for ADHD Illicit drugs: No Weight loss supplements: No New over-the-counter medications: No Herbal supplements: No  Current Medications: Current Meds  Medication Sig   albuterol (VENTOLIN HFA) 108 (90 Base) MCG/ACT inhaler Inhale 2 puffs into the lungs every 6 (six) hours as needed for wheezing or shortness of breath.   [START ON 05/27/2023] amphetamine-dextroamphetamine (ADDERALL XR) 20 MG 24 hr capsule Take 1 capsule (20 mg total) by mouth 2 (two) times daily with breakfast and lunch.   cetirizine (ZYRTEC) 10 MG tablet Take 10 mg by mouth daily.   Cholecalciferol (VITAMIN D3) 1000 units CAPS Take by mouth.   cloNIDine (CATAPRES) 0.1 MG tablet Take 1 tablet (0.1 mg total) by mouth at bedtime. Max daily dose: 1   etanercept (ENBREL MINI) 50 MG/ML injection INJECT 50 MG INTO THE SKIN ONCE A WEEK.   famotidine (PEPCID) 10 MG tablet Take 20 mg by mouth daily.    MAGNESIUM PO Take 500 mg by mouth daily.   methocarbamol (ROBAXIN) 500 MG tablet Take 1 tablet (500 mg total) by mouth 2 (two) times daily as needed.   metoprolol succinate (TOPROL-XL) 25 MG 24 hr tablet Take 1 tablet (25 mg total) by mouth daily.   metoprolol tartrate (LOPRESSOR) 25 MG tablet Take 1 tablet (25 mg total) by mouth twice daily for one week, on the week of your Cardiac CT.   metroNIDAZOLE (METROGEL) 0.75 % gel Apply 1 Application topically 2 (two) times daily.   montelukast (SINGULAIR) 10 MG tablet TAKE 1 TABLET(10 MG) BY MOUTH AT BEDTIME   Multiple Vitamin (MULTIVITAMIN) tablet Take 1 tablet by mouth daily.  Omega-3 Fatty Acids (FISH OIL PO) Take by mouth daily.   promethazine (PHENERGAN) 25 MG tablet Take 1 tablet (25 mg total) by mouth every 6 (six) hours as needed for nausea or vomiting.   SALINE MIST SPRAY NA Place into the nose.   sodium bicarbonate 650 MG tablet Take 1 tablet (650 mg total) by mouth 2 (two) times daily.   SUMAtriptan (IMITREX) 20 MG/ACT nasal spray Place 1  spray (20 mg total) into the nose every 2 (two) hours as needed.   SYNTHROID 112 MCG tablet Take 1 tablet in the morning on an empty stomach.   traMADol (ULTRAM) 50 MG tablet Take 1 tablet (50 mg total) by mouth 2 (two) times daily as needed.     Allergies:    Nucynta [tapentadol], Codeine, Penicillins, Tessalon [benzonatate], and Other   Past Medical History: Past Medical History:  Diagnosis Date   Allergic rhinitis, cause unspecified    Allergy    Anemia, unspecified    Anxiety state, unspecified    Arthritis    Cataract    bilaterally removed 2003,2007   Esophageal reflux    Irritable bowel syndrome    Migraine    Obesity, unspecified    Psoriatic arthropathy (HCC)    Unspecified chronic bronchitis (HCC)    Unspecified hereditary and idiopathic peripheral neuropathy    Unspecified hypothyroidism    Unspecified venous (peripheral) insufficiency     Past Surgical History: Past Surgical History:  Procedure Laterality Date   ABDOMINAL HYSTERECTOMY     ABDOMINAL HYSTERECTOMY     ABLATION ON ENDOMETRIOSIS     x2   Bilat TKRs  04/2010   by [name]   BUNIONECTOMY  2009   CATARACT EXTRACTION, BILATERAL     2003,2007   COLONOSCOPY  08-26-2004   with gessner tics and hems    dequervains tensenovitis Left    JOINT REPLACEMENT Bilateral    knees   NASAL SINUS SURGERY     SHOULDER SURGERY  2005   TOTAL SHOULDER ARTHROPLASTY      Social History: Social History   Tobacco Use   Smoking status: Former    Current packs/day: 0.00    Average packs/day: 1.5 packs/day for 22.0 years (33.0 ttl pk-yrs)    Types: Cigarettes    Start date: 06/08/1970    Quit date: 06/08/1992    Years since quitting: 30.9    Passive exposure: Never   Smokeless tobacco: Never  Vaping Use   Vaping status: Never Used  Substance Use Topics   Alcohol use: Yes    Alcohol/week: 0.0 standard drinks of alcohol    Comment: occasional wine   Drug use: Never    Family History: Family History   Problem Relation Age of Onset   COPD Mother    Heart disease Mother    Hypertension Mother    Varicose Veins Mother    Cirrhosis Father    Thyroid disease Sister    Breast cancer Maternal Grandmother    Breast cancer Other        1st cousin    Pancreatic cancer Other        1st cousin    Lymphoma Cousin    Colon cancer Neg Hx    Colon polyps Neg Hx    Esophageal cancer Neg Hx    Rectal cancer Neg Hx    Stomach cancer Neg Hx     ROS:   Review of Systems  Cardiovascular:  Negative for chest pain, claudication, irregular heartbeat,  leg swelling, near-syncope, orthopnea, palpitations, paroxysmal nocturnal dyspnea and syncope.  Respiratory:  Positive for shortness of breath.   Hematologic/Lymphatic: Negative for bleeding problem.    EKGs/Labs/Other Studies Reviewed:   EKG: EKG Interpretation Date/Time:  Tuesday May 25 2023 13:46:06 EST Ventricular Rate:  80 Text Interpretation: Normal sinus rhythm with sinus arrhythmia Left axis deviation No previous ECGs available Confirmed by Tessa Lerner [phone]) on 05/25/2023 1:56:04 PM  Echocardiogram: None  Labs:    Latest Ref Rng & Units 04/30/2023    2:00 PM 11/17/2022   11:09 AM 10/13/2022   12:00 AM  CBC  WBC 3.8 - 10.8 Thousand/uL 3.6  2.8  4.1      Hemoglobin 11.7 - 15.5 g/dL 60.4  54.0  98.1      Hematocrit 35.0 - 45.0 % 36.7  35.8  40      Platelets 140 - 400 Thousand/uL 132  126  148         This result is from an external source.       Latest Ref Rng & Units 04/30/2023    2:00 PM 11/17/2022   11:09 AM 10/13/2022   12:00 AM  BMP  Glucose 65 - 99 mg/dL 96  97    BUN 7 - 25 mg/dL 13  12  14       Creatinine 0.50 - 1.05 mg/dL 1.91  4.78  1.2      BUN/Creat Ratio 6 - 22 (calc) 12  11    Sodium 135 - 146 mmol/L 140  140  140      Potassium 3.5 - 5.3 mmol/L 4.2  4.2  4.6      Chloride 98 - 110 mmol/L 106  107  104      CO2 20 - 32 mmol/L 24  25  21       Calcium 8.6 - 10.4 mg/dL 9.1  9.2  9.3         This result  is from an external source.      Latest Ref Rng & Units 04/30/2023    2:00 PM 11/17/2022   11:09 AM 10/13/2022   12:00 AM  CMP  Glucose 65 - 99 mg/dL 96  97    BUN 7 - 25 mg/dL 13  12  14       Creatinine 0.50 - 1.05 mg/dL 2.95  6.21  1.2      Sodium 135 - 146 mmol/L 140  140  140      Potassium 3.5 - 5.3 mmol/L 4.2  4.2  4.6      Chloride 98 - 110 mmol/L 106  107  104      CO2 20 - 32 mmol/L 24  25  21       Calcium 8.6 - 10.4 mg/dL 9.1  9.2  9.3      Total Protein 6.1 - 8.1 g/dL 7.1  6.9    Total Bilirubin 0.2 - 1.2 mg/dL 0.8  0.5    AST 10 - 35 U/L 20  20    ALT 6 - 29 U/L 15  16       This result is from an external source.    Lab Results  Component Value Date   CHOL 170 11/17/2022   HDL 57 11/17/2022   LDLCALC 91 11/17/2022   TRIG 128 11/17/2022   CHOLHDL 3.0 11/17/2022   No results for input(s): "LIPOA" in the last 8760 hours. No components found for: "NTPROBNP" No results for input(s): "PROBNP"  in the last 8760 hours. Recent Labs    10/14/22 0000 11/17/22 1109  TSH 2.59 1.83   External Labs: Collected: July 2024 TSH 4.3. Total cholesterol 177, triglycerides 181, HDL 53, LDL calculated 93. Hemoglobin A1c 5.2. BUN 17, creatinine 0.98. Sodium 138, potassium 4.4, chloride 103, bicarb 24  Physical Exam:    Today's Vitals   05/25/23 1342  BP: 138/88  Pulse: 83  Resp: 16  SpO2: 93%  Weight: 235 lb (106.6 kg)  Height: 5\' 8"  (1.727 m)   Body mass index is 35.73 kg/m. Wt Readings from Last 3 Encounters:  05/25/23 235 lb (106.6 kg)  04/05/23 228 lb (103.4 kg)  01/19/23 228 lb (103.4 kg)    Physical Exam  Constitutional: No distress.  hemodynamically stable  Neck: No JVD present.  Cardiovascular: Normal rate, regular rhythm, S1 normal and S2 normal. Exam reveals no gallop, no S3 and no S4.  Murmur heard. Systolic murmur is present. Pulmonary/Chest: Effort normal and breath sounds normal. No stridor. She has no wheezes. She has no rales.  Abdominal:  Soft. Bowel sounds are normal. She exhibits no distension. There is no abdominal tenderness.  Musculoskeletal:        General: No edema.     Cervical back: Neck supple.  Neurological: She is alert and oriented to person, place, and time. She has intact cranial nerves (2-12).  Skin: Skin is warm.   Impression & Recommendation(s):  Impression:   ICD-10-CM   1. Shortness of breath  R06.02 EKG 12-Lead    ECHOCARDIOGRAM COMPLETE    2. Precordial pain  R07.2 CT CORONARY MORPH W/CTA COR W/SCORE W/CA W/CM &/OR WO/CM    ECHOCARDIOGRAM COMPLETE    3. Benign hypertension  I10     4. Hypothyroidism, unspecified type  E03.9     5. Hypertriglyceridemia  E78.1     6. Class 2 obesity due to excess calories without serious comorbidity with body mass index (BMI) of 35.0 to 35.9 in adult  E66.812    E66.09    Z68.35        Recommendation(s):  Shortness of breath Precordial pain Chronic. Progressive. EKG: Nonischemic No prior cardiovascular workup Physical examination notable for systolic murmur at the second right intercostal space. Known history of tachycardia for which she was prescribed Toprol-XL 25 mg p.o. daily I suspect that the tachycardia is likely physiological given her pharmacological therapy which includes Adderall and Catapres, deconditioning, etc. Patient has been on Adderall since 2013 would like to proceed with an echocardiogram to evaluate for structural heart disease, presence or absence of pulm hypertension per RVSP, and valvular heart disease. Patient is unable to exercise and therefore exercise nuclear stress test is not recommended.  Shared decision was to proceed with coronary CTA to evaluate for obstructive disease.  For now patient is asked to continue Toprol-XL. However a week prior to her coronary CTA have asked her to stop Toprol-XL and to start Lopressor 25 mg p.o. twice daily to optimize her heart rate prior to the CT scan.  Once CT scan is done she can stop  Lopressor and go back to her original Toprol-XL.  Benign hypertension Office blood pressures are acceptable. Recommended that she checks her blood pressures at home regularly to see if further medication titration is warranted. She is currently on clonidine which she takes for her ADHD and Toprol-XL to treat her tachycardia.  Class 2 obesity due to excess calories without serious comorbidity with body mass index (BMI) of 35.0 to  35.9 in adult Body mass index is 35.73 kg/m. I reviewed with her importance of diet, regular physical activity/exercise, weight loss.   Patient is educated on the importance of increasing physical activity gradually as tolerated with a goal of moderate intensity exercise for 30 minutes a day 5 days a week.   Patient states that her stimulants/Catapres is being managed by [facility].  I have asked her to follow-up with her provider to see if further medication titration could be considered.  Her most recent labs from November 2024 note bicytopenia with a low WBC and platelet counts.  I have asked her to follow-up with PCP consider hematology evaluation if needed.  Of note she is on Enbrel which could be contributory.  If the echo and coronary CTA are unremarkable consider cardiac monitor to evaluate for dysrhythmias.  Orders Placed:  Orders Placed This Encounter  Procedures   CT CORONARY MORPH W/CTA COR W/SCORE W/CA W/CM &/OR WO/CM    Standing Status:   Future    Expected Date:   05/25/2023    Expiration Date:   05/24/2024    If indicated for the ordered procedure, I authorize the administration of contrast media per Radiology protocol:   Yes    Initiate Coronary CTA Adult Protocol:   Yes    If indicated initiate Post Coronary CTA Hypotension Adult Protocol:   Yes    Does the patient have a contrast media/X-ray dye allergy?:   No    Preferred Imaging Location?:   [facility]    Authorization::   FFR will be ordered if deemed medically necessary   EKG  12-Lead   ECHOCARDIOGRAM COMPLETE    Standing Status:   Future    Expected Date:   06/01/2023    Expiration Date:   05/24/2024    Where should this test be performed:   [facility])    Does the patient weigh less than or greater than 250 lbs?:   Patient weighs less than 250 lbs    Perflutren DEFINITY (image enhancing agent) should be administered unless hypersensitivity or allergy exist:   Administer Perflutren    Reason for exam-Echo:   Other-Full Diagnosis List    Full ICD-10/Reason for Exam:   SOB (shortness of breath) [161096]   Final Medication List:    Meds ordered this encounter  Medications   metoprolol tartrate (LOPRESSOR) 25 MG tablet    Sig: Take 1 tablet (25 mg total) by mouth twice daily for one week, on the week of your Cardiac CT.    Dispense:  14 tablet    Refill:  0    Pt to stop Toprol XL and use this the week of her Cardiac CT.    Medications Discontinued During This Encounter  Medication Reason   amphetamine-dextroamphetamine (ADDERALL XR) 20 MG 24 hr capsule    amphetamine-dextroamphetamine (ADDERALL XR) 20 MG 24 hr capsule    amphetamine-dextroamphetamine (ADDERALL XR) 30 MG 24 hr capsule      Current Outpatient Medications:    albuterol (VENTOLIN HFA) 108 (90 Base) MCG/ACT inhaler, Inhale 2 puffs into the lungs every 6 (six) hours as needed for wheezing or shortness of breath., Disp: 6.7 g, Rfl: PRN   [START ON 05/27/2023] amphetamine-dextroamphetamine (ADDERALL XR) 20 MG 24 hr capsule, Take 1 capsule (20 mg total) by mouth 2 (two) times daily with breakfast and lunch., Disp: 60 capsule, Rfl: 0   cetirizine (ZYRTEC) 10 MG tablet, Take 10 mg by mouth daily., Disp: ,  Rfl:    Cholecalciferol (VITAMIN D3) 1000 units CAPS, Take by mouth., Disp: , Rfl:    cloNIDine (CATAPRES) 0.1 MG tablet, Take 1 tablet (0.1 mg total) by mouth at bedtime. Max daily dose: 1, Disp: 90 tablet, Rfl: 2   etanercept (ENBREL MINI) 50 MG/ML injection, INJECT 50 MG  INTO THE SKIN ONCE A WEEK., Disp: 12 mL, Rfl: 0   famotidine (PEPCID) 10 MG tablet, Take 20 mg by mouth daily. , Disp: , Rfl:    MAGNESIUM PO, Take 500 mg by mouth daily., Disp: , Rfl:    methocarbamol (ROBAXIN) 500 MG tablet, Take 1 tablet (500 mg total) by mouth 2 (two) times daily as needed., Disp: 60 tablet, Rfl: 0   metoprolol succinate (TOPROL-XL) 25 MG 24 hr tablet, Take 1 tablet (25 mg total) by mouth daily., Disp: 30 tablet, Rfl: 5   metoprolol tartrate (LOPRESSOR) 25 MG tablet, Take 1 tablet (25 mg total) by mouth twice daily for one week, on the week of your Cardiac CT., Disp: 14 tablet, Rfl: 0   metroNIDAZOLE (METROGEL) 0.75 % gel, Apply 1 Application topically 2 (two) times daily., Disp: 45 g, Rfl: 5   montelukast (SINGULAIR) 10 MG tablet, TAKE 1 TABLET(10 MG) BY MOUTH AT BEDTIME, Disp: 90 tablet, Rfl: 3   Multiple Vitamin (MULTIVITAMIN) tablet, Take 1 tablet by mouth daily., Disp: , Rfl:    Omega-3 Fatty Acids (FISH OIL PO), Take by mouth daily., Disp: , Rfl:    promethazine (PHENERGAN) 25 MG tablet, Take 1 tablet (25 mg total) by mouth every 6 (six) hours as needed for nausea or vomiting., Disp: 30 tablet, Rfl: 2   SALINE MIST SPRAY NA, Place into the nose., Disp: , Rfl:    sodium bicarbonate 650 MG tablet, Take 1 tablet (650 mg total) by mouth 2 (two) times daily., Disp: 30 tablet, Rfl: 5   SUMAtriptan (IMITREX) 20 MG/ACT nasal spray, Place 1 spray (20 mg total) into the nose every 2 (two) hours as needed., Disp: 6 each, Rfl: 2   SYNTHROID 112 MCG tablet, Take 1 tablet in the morning on an empty stomach., Disp: 30 tablet, Rfl: 11   traMADol (ULTRAM) 50 MG tablet, Take 1 tablet (50 mg total) by mouth 2 (two) times daily as needed., Disp: 60 tablet, Rfl: 1   [START ON 06/25/2023] amphetamine-dextroamphetamine (ADDERALL XR) 20 MG 24 hr capsule, Take 1 capsule (20 mg total) by mouth 2 (two) times daily with breakfast and lunch., Disp: 60 capsule, Rfl: 0  Consent:   N/A  Disposition:    Follow-up with APP in 8 weeks and myself in 6 months. Patient may be asked to follow-up sooner based on the results of the above-mentioned testing.  Her questions and concerns were addressed to her satisfaction. She voices understanding of the recommendations provided during this encounter.    Signed, Tessa Lerner, DO, [facility]  [facility]  [address] [place], [place] [address] 05/25/2023 5:19 PM

## 2023-05-26 ENCOUNTER — Other Ambulatory Visit (HOSPITAL_COMMUNITY): Payer: Self-pay

## 2023-05-26 ENCOUNTER — Other Ambulatory Visit: Payer: Self-pay | Admitting: Cardiology

## 2023-05-28 ENCOUNTER — Other Ambulatory Visit: Payer: Self-pay

## 2023-05-28 ENCOUNTER — Encounter (HOSPITAL_COMMUNITY): Payer: Self-pay

## 2023-05-28 ENCOUNTER — Other Ambulatory Visit (HOSPITAL_COMMUNITY): Payer: Self-pay

## 2023-05-28 NOTE — Progress Notes (Signed)
Specialty Pharmacy Refill Coordination Note  Sarah Horton is a 72 y.o. female contacted today regarding refills of specialty medication(s) Etanercept (Enbrel Mini)   Patient requested (Patient-Rptd) Delivery   Delivery date: (Patient-Rptd) 06/04/23   Verified address: (Patient-Rptd) [address], [place], [place]  [address]   Medication will be filled on 12.26.24.

## 2023-06-10 NOTE — Progress Notes (Signed)
Office Visit Note  Patient: Sarah Horton             Date of Birth: [birth year]           MRN: [id number]             PCP: Margaree Mackintosh, MD Referring: Margaree Mackintosh, MD Visit Date: 06/24/2023 Occupation: @GUAROCC @  Subjective:  Neck  stiffness  History of Present Illness: Sarah Horton is a 73 y.o. female with psoriatic arthritis and osteoarthritis.  She has not had a psoriatic arthritis flare.  She states her psoriasis is well-controlled.  She has been on Enbrel 50 mg subcu weekly without any interruption.  She states she has had couple of sinus infections since the last visit which resolved by itself.  She states she did not stop Enbrel.  She has been having some stiffness in her neck which she relates from prolonged driving.  She denies any history of uveitis, Achilles tendinitis or plantar fasciitis.  She continues to have some discomfort in her hands and her knee joints which are replaced.  She is intermittent lower back pain.  She denies any recent episodes of Raynauds.    Activities of Daily Living:  Patient reports morning stiffness for 15 minutes.   Patient Reports nocturnal pain.  Difficulty dressing/grooming: Denies Difficulty climbing stairs: Reports Difficulty getting out of chair: Denies Difficulty using hands for taps, buttons, cutlery, and/or writing: Reports  Review of Systems  Constitutional:  Positive for fatigue.  HENT:  Positive for mouth sores and mouth dryness.   Eyes:  Positive for dryness.  Respiratory:  Positive for shortness of breath.   Cardiovascular:  Negative for chest pain and palpitations.  Gastrointestinal:  Positive for constipation. Negative for blood in stool and diarrhea.  Endocrine: Negative for increased urination.  Genitourinary:  Negative for involuntary urination.  Musculoskeletal:  Positive for joint pain, joint pain, myalgias, muscle weakness, morning stiffness, muscle tenderness and myalgias. Negative for gait  problem and joint swelling.  Skin:  Positive for sensitivity to sunlight. Negative for color change, rash and hair loss.  Allergic/Immunologic: Positive for susceptible to infections.  Neurological:  Positive for headaches. Negative for dizziness.  Hematological:  Negative for swollen glands.  Psychiatric/Behavioral:  Positive for sleep disturbance. Negative for depressed mood. The patient is not nervous/anxious.     PMFS History:  Patient Active Problem List   Diagnosis Date Noted   History of peripheral neuropathy 11/20/2016   Left wrist tendonitis/ history of surgery for Dequervains  11/20/2016   Raynaud's disease without gangrene 09/22/2016   High risk medication use 09/22/2016   History of ulcer of lower limb/ Venous stasis ulcer 01/2015  09/22/2016   Osteopenia of multiple sites 09/22/2016   Psoriasis 09/03/2016   Chest wall pain 07/16/2016   Flank pain, acute 05/15/2016   Subacromial bursitis of right shoulder joint 04/10/2016   Low back pain 04/09/2016   Pre-ulcerative calluses 02/11/2016   Varicose veins of right lower extremity with complications 08/12/2015   Elevated BP 08/09/2015   Varicose veins of left lower extremity with complications 07/29/2015   Varicose veins of bilateral lower extremities with other complications 07/09/2015   Varicose veins of lower extremities with ulcer (HCC) 04/05/2015   Status post bilateral knee replacements 12/13/2013   Venous stasis ulcer of ankle (HCC) 06/06/2013   ADD (attention deficit disorder) 04/04/2012   INSOMNIA 06/20/2010   Hypothyroidism 01/17/2010   Obesity 01/17/2010   Osteoarthritis 01/17/2010   ANEMIA 10/02/2009  SINUSITIS, ACUTE 10/02/2009   Asthma 10/02/2009   Venous (peripheral) insufficiency 03/11/2008   Allergic rhinitis 03/11/2008   BRONCHITIS, RECURRENT 03/11/2008   History of migraine 03/11/2008   Anxiety state 11/24/2007   Hereditary and idiopathic peripheral neuropathy 11/24/2007   GERD 11/24/2007    IRRITABLE BOWEL SYNDROME 11/24/2007   PSORIATIC ARTHROPATHY 11/24/2007    Past Medical History:  Diagnosis Date   Allergic rhinitis, cause unspecified    Allergy    Anemia, unspecified    Anxiety state, unspecified    Arthritis    Cataract    bilaterally removed 2003,2007   Esophageal reflux    Irritable bowel syndrome    Migraine    Obesity, unspecified    Psoriatic arthropathy (HCC)    Unspecified chronic bronchitis (HCC)    Unspecified hereditary and idiopathic peripheral neuropathy    Unspecified hypothyroidism    Unspecified venous (peripheral) insufficiency     Family History  Problem Relation Age of Onset   COPD Mother    Heart disease Mother    Hypertension Mother    Varicose Veins Mother    Cirrhosis Father    Thyroid disease Sister    Breast cancer Maternal Grandmother    Breast cancer Other        1st cousin    Pancreatic cancer Other        1st cousin    Lymphoma Cousin    Colon cancer Neg Hx    Colon polyps Neg Hx    Esophageal cancer Neg Hx    Rectal cancer Neg Hx    Stomach cancer Neg Hx    Past Surgical History:  Procedure Laterality Date   ABDOMINAL HYSTERECTOMY     ABDOMINAL HYSTERECTOMY     ABLATION ON ENDOMETRIOSIS     x2   Bilat TKRs  04/2010   by [name]   BUNIONECTOMY  2009   CATARACT EXTRACTION, BILATERAL     2003,2007   COLONOSCOPY  08-26-2004   with gessner tics and hems    dequervains tensenovitis Left    JOINT REPLACEMENT Bilateral    knees   NASAL SINUS SURGERY     SHOULDER SURGERY  2005   TOTAL SHOULDER ARTHROPLASTY     Social History   Social History Narrative   Not on file   Immunization History  Administered Date(s) Administered   Fluad Quad(high Dose 65+) 05/16/2022   H1N1 05/18/2008   Influenza Inj Mdck Quad Pf 04/01/2017   Influenza Inj Mdck Quad With Preservative 04/13/2018   Influenza Split 03/23/2011, 03/14/2012, 03/23/2013, 03/22/2016   Influenza Whole 03/08/2008, 03/08/2009, 03/07/2010    Influenza,inj,Quad PF,6+ Mos 03/21/2014, 03/31/2019, 04/03/2021   PFIZER(Purple Top)SARS-COV-2 Vaccination 08/22/2019, 09/12/2019   PNEUMOCOCCAL CONJUGATE-20 11/20/2022   Pneumococcal Conjugate-13 06/28/2019   Td 01/06/2002   Tdap 03/14/2012   Zoster Recombinant(Shingrix) 03/08/2018     Objective: Vital Signs: BP 123/77 (BP Location: Left Arm, Patient Position: Sitting, Cuff Size: Large)   Pulse 89   Resp 14   Ht 5\' 8"  (1.727 m)   Wt 235 lb (106.6 kg)   BMI 35.73 kg/m    Physical Exam Vitals and nursing note reviewed.  Constitutional:      Appearance: She is well-developed.  HENT:     Head: Normocephalic and atraumatic.  Eyes:     Conjunctiva/sclera: Conjunctivae normal.  Cardiovascular:     Rate and Rhythm: Normal rate and regular rhythm.     Heart sounds: Normal heart sounds.  Pulmonary:  Effort: Pulmonary effort is normal.     Breath sounds: Normal breath sounds.  Abdominal:     General: Bowel sounds are normal.     Palpations: Abdomen is soft.  Musculoskeletal:     Cervical back: Normal range of motion.  Lymphadenopathy:     Cervical: No cervical adenopathy.  Skin:    General: Skin is warm and dry.     Capillary Refill: Capillary refill takes less than 2 seconds.  Neurological:     Mental Status: She is alert and oriented to person, place, and time.  Psychiatric:        Behavior: Behavior normal.      Musculoskeletal Exam: She had limitation with lateral rotation of the cervical spine.  There was some tenderness over the trapezius region.  Thoracic kyphosis was noted.  There was no tenderness over thoracic or lumbar spine.  There was no tenderness over SI joints.  Shoulders, elbow joints, wrist joints, MCPs PIPs and DIPs were in good range of motion with no synovitis.  Hip joints with good range of motion.  Bilateral knee joints were replaced without any warmth swelling or effusion.  There was no tenderness over ankles or MTPs.  CDAI Exam: CDAI Score:  -- Patient Global: --; Provider Global: -- Swollen: --; Tender: -- Joint Exam 06/24/2023   No joint exam has been documented for this visit   There is currently no information documented on the homunculus. Go to the Rheumatology activity and complete the homunculus joint exam.  Investigation: No additional findings.  Imaging: CT CORONARY MORPH W/CTA COR W/SCORE W/CA W/CM &/OR WO/CM Result Date: 06/17/2023 CLINICAL DATA:  73 year old with shortness of breath EXAM: Cardiac/Coronary  CTA TECHNIQUE: The patient was scanned on a [employer]. FINDINGS: A 120 kV prospective scan was triggered in the descending thoracic aorta at 111 HU's. Axial non-contrast 3 mm slices were carried out through the heart. The data set was analyzed on a dedicated work station and scored using the Agatson method. Gantry rotation speed was 250 msecs and collimation was .6 mm. 0.8 mg of sl NTG was given. The 3D data set was reconstructed in 5% intervals of the 67-82 % of the R-R cycle. Diastolic phases were analyzed on a dedicated work station using MPR, MIP and VRT modes. The patient received 80 cc of contrast. Image quality: Fair, slab artifact Aorta:  Normal size.  No calcifications.  No dissection. Aortic Valve:  Trileaflet.  No calcifications. Coronary Arteries:  Normal coronary origin.  Right dominance. RCA is a large dominant artery that gives rise to PDA and PLA. There is no plaque. Left main is a large artery that gives rise to LAD and LCX arteries. LAD is a large vessel that has no plaque. D1-moderate sized, no plaque LCX is a non-dominant artery. There is mild proximal stenosis 30-49%, soft plaque. OM1-moderate sized, slab artifact distal (modeled as moderate stenosis however appears affected by slab artifact) Other findings: Normal pulmonary vein drainage into the left atrium. Normal left atrial appendage without a thrombus. Normal size of the pulmonary artery. Please see radiology report for non cardiac  findings. IMPRESSION: 1. Coronary calcium score of 0. This was <1 percentile for age and sex matched control. 2. Total plaque volume (TPV) 41 mm3 which is 23rd percentile for age-and sex matched controls (calcified plaque 0 mm3; non-calcified plaque 41 mm3). TPV is mild. 3. Normal coronary origin with right dominance. 4. Mild proximal stenosis of left circumflex 30-49%, soft plaque. CAD-RADS  2. Mild non-obstructive CAD (25-49%). Consider non-atherosclerotic causes of chest pain. Consider preventive therapy and risk factor modification. Electronically Signed   By: Donato Schultz M.D.   On: 06/17/2023 13:06    Recent Labs: Lab Results  Component Value Date   WBC 3.6 (L) 04/30/2023   HGB 12.0 04/30/2023   PLT 132 (L) 04/30/2023   NA 140 04/30/2023   K 4.2 04/30/2023   CL 106 04/30/2023   CO2 24 04/30/2023   GLUCOSE 96 04/30/2023   BUN 13 04/30/2023   CREATININE 1.08 (H) 04/30/2023   BILITOT 0.8 04/30/2023   ALKPHOS 87 01/25/2017   AST 20 04/30/2023   ALT 15 04/30/2023   PROT 7.1 04/30/2023   ALBUMIN 4.2 10/13/2022   CALCIUM 9.1 04/30/2023   GFRAA 71 11/29/2020   QFTBGOLDPLUS NEGATIVE 04/30/2023    Speciality Comments: Prior therapy: Methotrexate (elevated creatinine),Arava-pt dcd  Procedures:  No procedures performed Allergies: Nucynta [tapentadol], Codeine, Penicillins, Tessalon [benzonatate], and Other   Assessment / Plan:     Visit Diagnoses: Psoriatic arthropathy (HCC)-patient had no synovitis on the examination.  She denies having arthritis flare.  She denies any history of uveitis, Achilles tendinitis or planta fasciitis.  She has been taking Enbrel weekly without any interruption.  Psoriasis-no active psoriasis lesions were noted.  High risk medication use - Enbrel 50 mg subcutaneous injections once weekly.  Labs from April 30, 2023 WBC count was low at 3.6 platelets were low at 132 creatinine was mildly elevated to 1.08 LFTs were normal.  TB Gold was negative.  She was  advised to get labs every 3 months.  Information for immunization was placed in the AVS.  She was advised to hold Enbrel if she develops an infection and resume after the infection resolves.  Annual skin examination to screen for skin cancer was advised.  Use of sunscreen and sun protection was advised.  Left wrist tendonitis/ history of surgery for Dequervains -resolved.  Trochanteric bursitis, left hip-currently not symptomatic.  She states she has symptoms off and on with prolonged driving.  Status post bilateral knee replacements-she had good range of motion without any warmth swelling or effusion.  Neck stiffness-patient complains of neck stiffness.  She has some limitation of range of motion.  A handout on neck exercises was given.  Sacroiliitis (HCC)-she has intermittent symptoms.  She had no tenderness over the SI joints today.  Raynaud's disease without gangrene-she uses gloves as needed.  There is no history of digital ulcers.  Osteopenia of multiple sites - DEXA updated on 08/01/2021: Right femoral neck BMD 0.598 with T score of -2.3.She is taking vitamin D 1000 units daily.  Followed by Dr. Lenord Fellers.  History of hypertension-blood pressure was normal at 123/77.  History of gastroesophageal reflux (GERD)  History of ulcer of lower limb/ Venous stasis ulcer 01/2015   History of hypothyroidism  History of asthma  History of migraine  History of IBS  Primary insomnia  Hereditary and idiopathic peripheral neuropathy  Orders: No orders of the defined types were placed in this encounter.  No orders of the defined types were placed in this encounter.    Follow-Up Instructions: Return in about 5 months (around 11/22/2023) for Psoriatic arthritis, Osteoarthritis.   Pollyann Savoy, MD  Note - This record has been created using Animal nutritionist.  Chart creation errors have been sought, but may not always  have been located. Such creation errors do not reflect on  the  standard of medical care.

## 2023-06-11 ENCOUNTER — Other Ambulatory Visit: Payer: Self-pay

## 2023-06-11 ENCOUNTER — Encounter (HOSPITAL_COMMUNITY): Payer: Self-pay

## 2023-06-16 ENCOUNTER — Ambulatory Visit (HOSPITAL_COMMUNITY)
Admission: RE | Admit: 2023-06-16 | Discharge: 2023-06-16 | Disposition: A | Discharge status: Home / Self Care | Payer: BC Managed Care – PPO | Source: Ambulatory Visit | Attending: Cardiology | Admitting: Cardiology

## 2023-06-16 DIAGNOSIS — R072 Precordial pain: Secondary | ICD-10-CM | POA: Diagnosis not present

## 2023-06-16 MED ORDER — METOPROLOL TARTRATE 5 MG/5ML IV SOLN
INTRAVENOUS | Status: AC
  Filled 2023-06-16: qty 5

## 2023-06-16 MED ORDER — METOPROLOL TARTRATE 5 MG/5ML IV SOLN
10.0000 mg | Freq: Once | INTRAVENOUS | Status: AC | PRN
  Administered 2023-06-16: 5 mg via INTRAVENOUS

## 2023-06-16 MED ORDER — DILTIAZEM HCL 25 MG/5ML IV SOLN
10.0000 mg | INTRAVENOUS | Status: DC | PRN
  Administered 2023-06-16: 10 mg via INTRAVENOUS

## 2023-06-16 MED ORDER — DILTIAZEM HCL 25 MG/5ML IV SOLN
INTRAVENOUS | Status: AC
Start: 2023-06-16 — End: ?
  Filled 2023-06-16: qty 5

## 2023-06-16 MED ORDER — NITROGLYCERIN 0.4 MG SL SUBL
0.8000 mg | SUBLINGUAL_TABLET | Freq: Once | SUBLINGUAL | Status: AC
  Administered 2023-06-16: 0.8 mg via SUBLINGUAL

## 2023-06-16 MED ORDER — NITROGLYCERIN 0.4 MG SL SUBL
SUBLINGUAL_TABLET | SUBLINGUAL | Status: AC
  Filled 2023-06-16: qty 2

## 2023-06-16 MED ORDER — IOHEXOL 350 MG/ML SOLN
95.0000 mL | Freq: Once | INTRAVENOUS | Status: AC | PRN
  Administered 2023-06-16: 95 mL via INTRAVENOUS

## 2023-06-21 ENCOUNTER — Other Ambulatory Visit (HOSPITAL_COMMUNITY): Payer: Self-pay

## 2023-06-24 ENCOUNTER — Ambulatory Visit: Payer: BC Managed Care – PPO | Attending: Rheumatology | Admitting: Rheumatology

## 2023-06-24 ENCOUNTER — Encounter: Payer: Self-pay | Admitting: Rheumatology

## 2023-06-24 VITALS — BP 123/77 | HR 89 | Resp 14 | Ht 68.0 in | Wt 235.0 lb

## 2023-06-24 DIAGNOSIS — M19041 Primary osteoarthritis, right hand: Secondary | ICD-10-CM | POA: Diagnosis not present

## 2023-06-24 DIAGNOSIS — Z872 Personal history of diseases of the skin and subcutaneous tissue: Secondary | ICD-10-CM

## 2023-06-24 DIAGNOSIS — M778 Other enthesopathies, not elsewhere classified: Secondary | ICD-10-CM

## 2023-06-24 DIAGNOSIS — M7062 Trochanteric bursitis, left hip: Secondary | ICD-10-CM

## 2023-06-24 DIAGNOSIS — I7300 Raynaud's syndrome without gangrene: Secondary | ICD-10-CM

## 2023-06-24 DIAGNOSIS — M461 Sacroiliitis, not elsewhere classified: Secondary | ICD-10-CM

## 2023-06-24 DIAGNOSIS — Z79899 Other long term (current) drug therapy: Secondary | ICD-10-CM

## 2023-06-24 DIAGNOSIS — F5101 Primary insomnia: Secondary | ICD-10-CM

## 2023-06-24 DIAGNOSIS — L4050 Arthropathic psoriasis, unspecified: Secondary | ICD-10-CM

## 2023-06-24 DIAGNOSIS — Z96653 Presence of artificial knee joint, bilateral: Secondary | ICD-10-CM

## 2023-06-24 DIAGNOSIS — L409 Psoriasis, unspecified: Secondary | ICD-10-CM | POA: Diagnosis not present

## 2023-06-24 DIAGNOSIS — G609 Hereditary and idiopathic neuropathy, unspecified: Secondary | ICD-10-CM

## 2023-06-24 DIAGNOSIS — Z8639 Personal history of other endocrine, nutritional and metabolic disease: Secondary | ICD-10-CM

## 2023-06-24 DIAGNOSIS — Z8709 Personal history of other diseases of the respiratory system: Secondary | ICD-10-CM

## 2023-06-24 DIAGNOSIS — M8589 Other specified disorders of bone density and structure, multiple sites: Secondary | ICD-10-CM

## 2023-06-24 DIAGNOSIS — Z8719 Personal history of other diseases of the digestive system: Secondary | ICD-10-CM

## 2023-06-24 DIAGNOSIS — M436 Torticollis: Secondary | ICD-10-CM

## 2023-06-24 DIAGNOSIS — M19042 Primary osteoarthritis, left hand: Secondary | ICD-10-CM

## 2023-06-24 DIAGNOSIS — Z8679 Personal history of other diseases of the circulatory system: Secondary | ICD-10-CM

## 2023-06-24 DIAGNOSIS — Z8669 Personal history of other diseases of the nervous system and sense organs: Secondary | ICD-10-CM

## 2023-06-24 NOTE — Patient Instructions (Signed)
Standing Labs We placed an order today for your standing lab work.   Please have your standing labs drawn in February and every 3 months  Please have your labs drawn 2 weeks prior to your appointment so that the provider can discuss your lab results at your appointment, if possible.  Please note that you may see your imaging and lab results in MyChart before we have reviewed them. We will contact you once all results are reviewed. Please allow our office up to 72 hours to thoroughly review all of the results before contacting the office for clarification of your results.  WALK-IN LAB HOURS  Monday through Thursday from 8:00 am -12:30 pm and 1:00 pm-5:00 pm and Friday from 8:00 am-12:00 pm.  Patients with office visits requiring labs will be seen before walk-in labs.  You may encounter longer than normal wait times. Please allow additional time. Wait times may be shorter on  Monday and Thursday afternoons.  We do not book appointments for walk-in labs. We appreciate your patience and understanding with our staff.   Labs are drawn by [facility]. Please bring your co-pay at the time of your lab draw.  You may receive a bill from [facility] for your lab work.  Please note if you are on Hydroxychloroquine and and an order has been placed for a Hydroxychloroquine level,  you will need to have it drawn 4 hours or more after your last dose.  If you wish to have your labs drawn at another location, please call the office 24 hours in advance so we can fax the orders.  The office is located at [address], [address], [place], [place] [address]   If you have any questions regarding directions or hours of operation,  please call [phone].   As a reminder, please drink plenty of water prior to coming for your lab work. Thanks!   Vaccines You are taking a medication(s) that can suppress your immune system.  The following immunizations are recommended: Flu annually Covid-19  RSV Td/Tdap (tetanus,  diphtheria, pertussis) every 10 years Pneumonia (Prevnar 15 then Pneumovax 23 at least 1 year apart.  Alternatively, can take Prevnar 20 without needing additional dose) Shingrix: 2 doses from 4 weeks to 6 months apart  Please check with your PCP to make sure you are up to date.   If you have signs or symptoms of an infection or start antibiotics: First, call your PCP for workup of your infection. Hold your medication through the infection, until you complete your antibiotics, and until symptoms resolve if you take the following: Injectable medication (Actemra, Benlysta, Cimzia, Cosentyx, Enbrel, Humira, Kevzara, Orencia, Remicade, Simponi, Stelara, Taltz, Tremfya) Methotrexate Leflunomide (Arava) Mycophenolate (Cellcept) Harriette Ohara, Olumiant, or Rinvoq   Please get an annual skin exam to screen for skin cancer. Please use sunscreen and sun protection while you are on Enbrel.  Cervical Strain and Sprain Rehab Ask your health care provider which exercises are safe for you. Do exercises exactly as told by your health care provider and adjust them as directed. It is normal to feel mild stretching, pulling, tightness, or discomfort as you do these exercises. Stop right away if you feel sudden pain or your pain gets worse. Do not begin these exercises until told by your health care provider. Stretching and range-of-motion exercises Cervical side bending  Using good posture, sit on a stable chair or stand up. Without moving your shoulders, slowly tilt your left / right ear to your shoulder until you feel  a stretch in the neck muscles on the opposite side. You should be looking straight ahead. Hold for __________ seconds. Repeat with the other side of your neck. Repeat __________ times. Complete this exercise __________ times a day. Cervical rotation  Using good posture, sit on a stable chair or stand up. Slowly turn your head to the side as if you are looking over your left / right  shoulder. Keep your eyes level with the ground. Stop when you feel a stretch along the side and the back of your neck. Hold for __________ seconds. Repeat this by turning to your other side. Repeat __________ times. Complete this exercise __________ times a day. Thoracic extension and pectoral stretch  Roll a towel or a small blanket so it is about 4 inches (10 cm) in diameter. Lie down on your back on a firm surface. Put the towel in the middle of your back across your spine. It should not be under your shoulder blades. Put your hands behind your head and let your elbows fall out to your sides. Hold for __________ seconds. Repeat __________ times. Complete this exercise __________ times a day. Strengthening exercises Upper cervical flexion  Lie on your back with a thin pillow behind your head or a small, rolled-up towel under your neck. Gently tuck your chin toward your chest and nod your head down to look toward your feet. Do not lift your head off the pillow. Hold for __________ seconds. Release the tension slowly. Relax your neck muscles completely before you repeat this exercise. Repeat __________ times. Complete this exercise __________ times a day. Cervical extension  Stand about 6 inches (15 cm) away from a wall, with your back facing the wall. Place a soft object, about 6-8 inches (15-20 cm) in diameter, between the back of your head and the wall. A soft object could be a small pillow, a ball, or a folded towel. Gently tilt your head back and press into the soft object. Keep your jaw and forehead relaxed. Hold for __________ seconds. Release the tension slowly. Relax your neck muscles completely before you repeat this exercise. Repeat __________ times. Complete this exercise __________ times a day. Posture and body mechanics Body mechanics refer to the movements and positions of your body while you do your daily activities. Posture is part of body mechanics. Good posture and  healthy body mechanics can help to relieve stress in your body's tissues and joints. Good posture means that your spine is in its natural S-curve position (your spine is neutral), your shoulders are pulled back slightly, and your head is not tipped forward. The following are general guidelines for using improved posture and body mechanics in your everyday activities. Sitting  When sitting, keep your spine neutral and keep your feet flat on the floor. Use a footrest, if needed, and keep your thighs parallel to the floor. Avoid rounding your shoulders. Avoid tilting your head forward. When working at a desk or a computer, keep your desk at a height where your hands are slightly lower than your elbows. Slide your chair under your desk so you are close enough to maintain good posture. When working at a computer, place your monitor at a height where you are looking straight ahead and you do not have to tilt your head forward or downward to look at the screen. Standing  When standing, keep your spine neutral and keep your feet about hip-width apart. Keep a slight bend in your knees. Your ears, shoulders, and hips should  line up. When you do a task in which you stand in one place for a long time, place one foot up on a stable object that is 2-4 inches (5-10 cm) high, such as a footstool. This helps keep your spine neutral. Resting When lying down and resting, avoid positions that are most painful for you. Try to support your neck in a neutral position. You can use a contour pillow or a small rolled-up towel. Your pillow should support your neck but not push on it. This information is not intended to replace advice given to you by your health care provider. Make sure you discuss any questions you have with your health care provider. Document Revised: 09/28/2022 Document Reviewed: 12/15/2021 Elsevier Patient Education  2024 [employer].

## 2023-06-25 ENCOUNTER — Other Ambulatory Visit: Payer: Self-pay

## 2023-06-25 ENCOUNTER — Ambulatory Visit (HOSPITAL_COMMUNITY): Payer: BC Managed Care – PPO | Attending: Cardiology

## 2023-06-25 DIAGNOSIS — R072 Precordial pain: Secondary | ICD-10-CM | POA: Insufficient documentation

## 2023-06-25 DIAGNOSIS — R0602 Shortness of breath: Secondary | ICD-10-CM | POA: Insufficient documentation

## 2023-06-25 LAB — ECHOCARDIOGRAM COMPLETE
AR max vel: 1.17 cm2
AV Area VTI: 1.18 cm2
AV Area mean vel: 1.13 cm2
AV Mean grad: 10.6 mm[Hg]
AV Peak grad: 18.3 mm[Hg]
Ao pk vel: 2.14 m/s
Area-P 1/2: 3.75 cm2
S' Lateral: 2.6 cm

## 2023-06-28 ENCOUNTER — Telehealth: Payer: Self-pay | Admitting: Cardiology

## 2023-06-28 ENCOUNTER — Telehealth: Payer: Self-pay

## 2023-06-28 DIAGNOSIS — L4050 Arthropathic psoriasis, unspecified: Secondary | ICD-10-CM

## 2023-06-28 NOTE — Telephone Encounter (Signed)
Lm to call back ./cy 

## 2023-06-28 NOTE — Telephone Encounter (Signed)
 Pt returning call, requesting cb

## 2023-06-28 NOTE — Telephone Encounter (Signed)
Patient called stating her specialty pharmacy has now changed to [facility]. (I have updated the chart and she will use [facility] for local/acute prescriptions). Patient was advised that enbrel needs a PA. Patient took her last injection on Saturday but knows this will take a few days and her injection could be delayed. I did advise patient we could possibly provide a sample if we have one available during the PA period. I advised patient I would forward this message to the pharmacy team for next steps. Her call back number is: [phone]

## 2023-06-28 NOTE — Telephone Encounter (Signed)
Patient returned RN Christine's call regarding results.

## 2023-06-28 NOTE — Telephone Encounter (Signed)
See echo results ./cy

## 2023-06-29 ENCOUNTER — Other Ambulatory Visit (HOSPITAL_COMMUNITY): Payer: Self-pay

## 2023-06-29 MED ORDER — ENBREL MINI 50 MG/ML ~~LOC~~ SOCT: 50.0000 mg | SUBCUTANEOUS | 0 refills | Status: DC

## 2023-06-29 NOTE — Telephone Encounter (Signed)
Submitted an URGENT Prior Authorization request to [employer] for ENBREL via CoverMyMeds. Will update once we receive a response.  Key: [id number]

## 2023-06-29 NOTE — Telephone Encounter (Signed)
Received notification from [facility] regarding a prior authorization for ENBREL. Authorization has been APPROVED from 06/09/2023 to 06/28/2024. Approval letter sent to scan center.  Patient must fill through [facility]: [phone]. Rx sent to [facility] today.  Authorization # [id number]  Patient advised that she will likely need sample since [facility] take 7-10 business days to onboard new patients  Chesley Mires, PharmD, MPH, BCPS, CPP Clinical Pharmacist (Rheumatology and Pulmonology)

## 2023-06-30 ENCOUNTER — Other Ambulatory Visit: Payer: Self-pay

## 2023-06-30 NOTE — Progress Notes (Signed)
Disenrolling - Patient's insurance requires [facility] to fill spec. meds

## 2023-07-02 ENCOUNTER — Other Ambulatory Visit: Payer: Self-pay

## 2023-07-06 ENCOUNTER — Other Ambulatory Visit: Payer: Self-pay

## 2023-07-06 MED ORDER — MONTELUKAST SODIUM 10 MG PO TABS: 10.0000 mg | ORAL_TABLET | Freq: Every day | ORAL | 3 refills | Status: DC

## 2023-07-07 ENCOUNTER — Other Ambulatory Visit (HOSPITAL_COMMUNITY): Payer: Self-pay

## 2023-07-08 ENCOUNTER — Other Ambulatory Visit: Payer: Self-pay

## 2023-07-08 ENCOUNTER — Other Ambulatory Visit (HOSPITAL_COMMUNITY): Payer: Self-pay

## 2023-07-09 ENCOUNTER — Other Ambulatory Visit: Payer: Self-pay

## 2023-07-12 ENCOUNTER — Other Ambulatory Visit (HOSPITAL_COMMUNITY): Payer: Self-pay

## 2023-07-13 ENCOUNTER — Other Ambulatory Visit (HOSPITAL_COMMUNITY): Payer: Self-pay

## 2023-07-13 ENCOUNTER — Other Ambulatory Visit: Payer: Self-pay

## 2023-07-15 ENCOUNTER — Other Ambulatory Visit: Payer: Self-pay

## 2023-07-16 ENCOUNTER — Other Ambulatory Visit (HOSPITAL_COMMUNITY): Payer: Self-pay

## 2023-07-17 ENCOUNTER — Other Ambulatory Visit (HOSPITAL_COMMUNITY): Payer: Self-pay

## 2023-07-19 ENCOUNTER — Other Ambulatory Visit: Payer: Self-pay | Admitting: Rheumatology

## 2023-07-19 DIAGNOSIS — L4050 Arthropathic psoriasis, unspecified: Secondary | ICD-10-CM

## 2023-07-19 NOTE — Progress Notes (Deleted)
 Cardiology Office Note    Patient Name: Sarah Horton Date of Encounter: 07/19/2023  Primary Care Provider:  Margaree Mackintosh, MD Primary Cardiologist:  None Primary Electrophysiologist: None   Past Medical History    Past Medical History:  Diagnosis Date   Allergic rhinitis, cause unspecified    Allergy    Anemia, unspecified    Anxiety state, unspecified    Arthritis    Cataract    bilaterally removed 2003,2007   Esophageal reflux    Irritable bowel syndrome    Migraine    Obesity, unspecified    Psoriatic arthropathy (HCC)    Unspecified chronic bronchitis (HCC)    Unspecified hereditary and idiopathic peripheral neuropathy    Unspecified hypothyroidism    Unspecified venous (peripheral) insufficiency     History of Present Illness  Sarah Horton is a 73 y.o. female with a PMH of HTN, hypertriglyceridemia, hypothyroidism who presents today for 61-month follow-up.  Ms. Chargois was seen for initial visit with Dr.Tolia on 05/25/2023 for evaluation of shortness of breath and questionable arrhythmia.  She has a history of ADHD and is currently taking Toprol-XL along with clonidine.  She reported more noticeable shortness of breath with effort such as walking but denied any chest pain.  She does report complaint of chest pain that she described as throat closing discomfort that radiates down the substernal region and left-sided.  She reported episodes occurring over the past 4 to 5 months.  She underwent a 2D echo for further evaluation that showed EF of 60 to 65% with no RWMA and mild calcification of AVR with mild to moderate AVS.  She underwent a coronary CTA that showed mild nonobstructive CAD with calcium score of 0.   During today's visit the patient reports*** .  Patient denies chest pain, palpitations, dyspnea, PND, orthopnea, nausea, vomiting, dizziness, syncope, edema, weight gain, or early satiety.  ***Notes: -Last ischemic evaluation: -Last  echo: -Interim ED visits: Review of Systems  Please see the history of present illness.    All other systems reviewed and are otherwise negative except as noted above.  Physical Exam    Wt Readings from Last 3 Encounters:  06/24/23 235 lb (106.6 kg)  05/25/23 235 lb (106.6 kg)  04/05/23 228 lb (103.4 kg)   EA:VWUJW were no vitals filed for this visit.,There is no height or weight on file to calculate BMI. GEN: Well nourished, well developed in no acute distress Neck: No JVD; No carotid bruits Pulmonary: Clear to auscultation without rales, wheezing or rhonchi  Cardiovascular: Normal rate. Regular rhythm. Normal S1. Normal S2.   Murmurs: There is no murmur.  ABDOMEN: Soft, non-tender, non-distended EXTREMITIES:  No edema; No deformity   EKG/LABS/ Recent Cardiac Studies   ECG personally reviewed by me today - ***  Risk Assessment/Calculations:   {Does this patient have ATRIAL FIBRILLATION?:[id number]}      Lab Results  Component Value Date   WBC 3.6 (L) 04/30/2023   HGB 12.0 04/30/2023   HCT 36.7 04/30/2023   MCV 81.9 04/30/2023   PLT 132 (L) 04/30/2023   Lab Results  Component Value Date   CREATININE 1.08 (H) 04/30/2023   BUN 13 04/30/2023   NA 140 04/30/2023   K 4.2 04/30/2023   CL 106 04/30/2023   CO2 24 04/30/2023   Lab Results  Component Value Date   CHOL 170 11/17/2022   HDL 57 11/17/2022   LDLCALC 91 11/17/2022   TRIG 128 11/17/2022   CHOLHDL 3.0  11/17/2022    Lab Results  Component Value Date   HGBA1C 5.0 04/01/2021   Assessment & Plan    1.  Shortness of breath  2.  Primary hypertension  3.  Precordial chest pain  4.  Hypertriglyceridemia:      Disposition: Follow-up with None or APP in *** months {Are you ordering a CV Procedure (e.g. stress test, cath, DCCV, TEE, etc)?   Press F2        :[id number]}   Signed, Napoleon Form, Leodis Rains, NP 07/19/2023, 12:36 PM [facility]

## 2023-07-20 ENCOUNTER — Ambulatory Visit: Payer: BC Managed Care – PPO | Admitting: Nurse Practitioner

## 2023-07-20 DIAGNOSIS — R0602 Shortness of breath: Secondary | ICD-10-CM

## 2023-07-20 DIAGNOSIS — I10 Essential (primary) hypertension: Secondary | ICD-10-CM

## 2023-07-20 DIAGNOSIS — E781 Pure hyperglyceridemia: Secondary | ICD-10-CM

## 2023-07-20 DIAGNOSIS — R072 Precordial pain: Secondary | ICD-10-CM

## 2023-07-28 ENCOUNTER — Other Ambulatory Visit (HOSPITAL_COMMUNITY): Payer: Self-pay

## 2023-08-03 ENCOUNTER — Ambulatory Visit: Payer: Self-pay | Admitting: Internal Medicine

## 2023-08-03 NOTE — Telephone Encounter (Signed)
 Called [facility] and spoke with Burna Mortimer r/t pt refusal of seeing provider w/n 24 hours due her PCP not available.

## 2023-08-03 NOTE — Telephone Encounter (Signed)
 Called patient and offered an appointment for this afternoon and she did not want to come today. She said she could wait until  Thursday, because explained we would not be in office tomorrow, she verbalized understanding and made an appointment for Thursday morning.

## 2023-08-03 NOTE — Telephone Encounter (Signed)
 Reason for Disposition . SEVERE coughing spells (e.g., whooping sound after coughing, vomiting after coughing)  Protocols used: Cough - Acute Productive-A-AH

## 2023-08-03 NOTE — Telephone Encounter (Addendum)
 Copied from CRM [id number]. Topic: Clinical - Red Word Triage >> Aug 03, 2023  2:09 PM Franchot Heidelberg wrote: Red Word that prompted transfer to Nurse Triage: Wheezing  Chief Complaint: difficulty breathing Symptoms: SOB w/ exertion & at rest & O2@ 92-97 on RA, wheezing, coughing-productive at times, coughing spells lead to SOB & it takes a few minutes for patient to recover  Frequency: started this weekend Pertinent Negatives: Patient denies chest pain Disposition: [] ED /[] Urgent Care (no appt availability in office) / [] Appointment(In office/virtual)/ []  [facility]/ [] Home Care/ [x] Refused Recommended Disposition /[] [facility]/ []  Follow-up with PCP Additional Notes: COVID test negative 2 weeks ago. Using inhaler more than normal due to wheezing & SOB. Pt husband reported pt sits hunched over at times to catch breath.  Pt has hoarseness. Nurse recommended pt been seen in 24 hours: no appt available with PCP & patient refused to been by a different PCP: will route information of to clinic. Answer Assessment - Initial Assessment Questions 1. RESPIRATORY STATUS: "Describe your breathing?" (e.g., wheezing, shortness of breath, unable to speak, severe coughing)      Talking increasing SOB, increasing inhaler 2. ONSET: "When did this breathing problem begin?"      This weekend 3. PATTERN "Does the difficult breathing come and go, or has it been constant since it started?"      Comes and goes  4. SEVERITY: "How bad is your breathing?" (e.g., mild, moderate, severe)    - MILD: No SOB at rest, mild SOB with walking, speaks normally in sentences, can lie down, no retractions, pulse < 100.    - MODERATE: SOB at rest, SOB with minimal exertion and prefers to sit, cannot lie down flat, speaks in phrases, mild retractions, audible wheezing, pulse 100-120.    - SEVERE: Very SOB at rest, speaks in single words, struggling to breathe, sitting hunched forward, retractions, pulse > 120       Moderate - takes a few minutes to catch her breath 5. RECURRENT SYMPTOM: "Have you had difficulty breathing before?" If Yes, ask: "When was the last time?" and "What happened that time?"      Yes but not as severe 6. CARDIAC HISTORY: "Do you have any history of heart disease?" (e.g., heart attack, angina, bypass surgery, angioplasty)      no 7. LUNG HISTORY: "Do you have any history of lung disease?"  (e.g., pulmonary embolus, asthma, emphysema)     asthma 8. CAUSE: "What do you think is causing the breathing problem?"      unknown 9. OTHER SYMPTOMS: "Do you have any other symptoms? (e.g., dizziness, runny nose, cough, chest pain, fever)     Nasal.chest congestion, cough 10. O2 SATURATION MONITOR:  "Do you use an oxygen saturation monitor (pulse oximeter) at home?" If Yes, ask: "What is your reading (oxygen level) today?" "What is your usual oxygen saturation reading?" (e.g., 95%)       92-97 11. PREGNANCY: "Is there any chance you are pregnant?" "When was your last menstrual period?"       N/a 12. TRAVEL: "Have you traveled out of the country in the last month?" (e.g., travel history, exposures)       N/a  Answer Assessment - Initial Assessment Questions 1. ONSET: "When did the cough begin?"      This weekend 2. SEVERITY: "How bad is the cough today?"      severe 3. SPUTUM: "Describe the color of your sputum" (none, dry cough; clear, white,  yellow, green)     Unknown - sometimes productive and sometimes not 4. HEMOPTYSIS: "Are you coughing up any blood?" If so ask: "How much?" (flecks, streaks, tablespoons, etc.)     no 5. DIFFICULTY BREATHING: "Are you having difficulty breathing?" If Yes, ask: "How bad is it?" (e.g., mild, moderate, severe)    - MILD: No SOB at rest, mild SOB with walking, speaks normally in sentences, can lie down, no retractions, pulse < 100.    - MODERATE: SOB at rest, SOB with minimal exertion and prefers to sit, cannot lie down flat, speaks in phrases, mild  retractions, audible wheezing, pulse 100-120.    - SEVERE: Very SOB at rest, speaks in single words, struggling to breathe, sitting hunched forward, retractions, pulse > 120   moderate 6. FEVER: "Do you have a fever?" If Yes, ask: "What is your temperature, how was it measured, and when did it start?"        No - but had fever last week 7. CARDIAC HISTORY: "Do you have any history of heart disease?" (e.g., heart attack, congestive heart failure)      no 8. LUNG HISTORY: "Do you have any history of lung disease?"  (e.g., pulmonary embolus, asthma, emphysema)     asthma 9. PE RISK FACTORS: "Do you have a history of blood clots?" (or: recent major surgery, recent prolonged travel, bedridden)     no 10. OTHER SYMPTOMS: "Do you have any other symptoms?" (e.g., runny nose, wheezing, chest pain)       Wheezing, coughing, nasal/chest congestion, SOB at times 11. PREGNANCY: "Is there any chance you are pregnant?" "When was your last menstrual period?"       No  12. TRAVEL: "Have you traveled out of the country in the last month?" (e.g., travel history, exposures)       No  Protocols used: Breathing Difficulty-A-AH, Cough - Acute Productive-A-AH

## 2023-08-04 NOTE — Progress Notes (Signed)
 Patient Care Team: Margaree Mackintosh, MD as PCP - General (Internal Medicine) Evlyn Kanner, MD (Inactive) as Consulting Physician (Surgery)  Visit Date: 08/05/23  Subjective:   Chief Complaint  Patient presents with   Cough    Coughing going on since the last week of January.   Patient Sarah Horton,Female DOB:November 26, 1950,72 y.o. [id number]   73 y.o. Female presents today for acute sick visit with severe Cough. Patient has a past medical history of Allergic Rhinitis, Allergy, Psoriatic Arthritis treated with Embrel. According to the triage note, her symptoms - reported as SOB w/ exertion & prolonged coughing, wheezing, intermittently productive cough, and at time of call SpO2 92-97%) - onset over last weekend. At-home Covid-19 test 2 weeks ago was NEGATIVE. Endorsed using inhaler more often. Today she reports that initially she had symptoms in January that seemed to resolved but then began to reoccur and worsened. Endorses cough w/ expectoration of greenish-brown mucus, post-nasal drainage contributing to her cough, and nasal congestion. Mentions that she believes her symptoms usually begin as allergies that progress into illness and that prior to the pandemic she was seeing Dr. Madie Reno and years ago did have allergy injections but stopped after her allergies improved. Notes that she has discussed with her Rheumatologist about temporarily stopping her Embril when she is sick, and has recommended to do so to avoid immune suppression.  Past Medical History:  Diagnosis Date   Allergic rhinitis, cause unspecified    Allergy    Anemia, unspecified    Anxiety state, unspecified    Arthritis    Cataract    bilaterally removed 2003,2007   Esophageal reflux    Irritable bowel syndrome    Migraine    Obesity, unspecified    Psoriatic arthropathy (HCC)    Unspecified chronic bronchitis (HCC)    Unspecified hereditary and idiopathic peripheral neuropathy    Unspecified  hypothyroidism    Unspecified venous (peripheral) insufficiency     Allergies  Allergen Reactions   Nucynta [Tapentadol]     Hives   Codeine     REACTION: severe nausea by itself   Penicillins     REACTION: hives all over   Tessalon [Benzonatate]     rash   Other Rash    FOOD DYES    Family History  Problem Relation Age of Onset   COPD Mother    Heart disease Mother    Hypertension Mother    Varicose Veins Mother    Cirrhosis Father    Thyroid disease Sister    Breast cancer Maternal Grandmother    Breast cancer Other        1st cousin    Pancreatic cancer Other        1st cousin    Lymphoma Cousin    Colon cancer Neg Hx    Colon polyps Neg Hx    Esophageal cancer Neg Hx    Rectal cancer Neg Hx    Stomach cancer Neg Hx    Social History   Social History Narrative   Not on file   Review of Systems  Constitutional:  Positive for fever.  HENT:  Positive for congestion (nasal).   Respiratory:  Positive for cough, sputum production (greenish-brown), shortness of breath and wheezing.      Objective:  Vitals: BP 110/70   Pulse 84   Temp 98.5 F (36.9 C)   Ht 5\' 8"  (1.727 m)   Wt 235 lb (106.6 kg)   SpO2 96%   BMI  35.73 kg/m   Physical Exam HENT:     Right Ear: Tympanic membrane, ear canal and external ear normal.     Left Ear: Tympanic membrane is injected (full, splayed light reflex). Tympanic membrane is not erythematous.     Mouth/Throat:     Pharynx: Posterior oropharyngeal erythema (slightly) present.  Pulmonary:     Breath sounds: Examination of the right-upper field reveals wheezing. Wheezing (minor) present.     Results:  Studies Obtained And Personally Reviewed By Me: Labs:     Component Value Date/Time   NA 140 04/30/2023 1400   NA 140 10/13/2022 0000   K 4.2 04/30/2023 1400   CL 106 04/30/2023 1400   CO2 24 04/30/2023 1400   GLUCOSE 96 04/30/2023 1400   GLUCOSE 90 04/16/2006 1125   BUN 13 04/30/2023 1400   BUN 14 10/13/2022 0000    CREATININE 1.08 (H) 04/30/2023 1400   CALCIUM 9.1 04/30/2023 1400   PROT 7.1 04/30/2023 1400   PROT 6.7 09/08/2016 1648   ALBUMIN 4.2 10/13/2022 0000   ALBUMIN 4.2 09/08/2016 1648   AST 20 04/30/2023 1400   ALT 15 04/30/2023 1400   ALKPHOS 87 01/25/2017 1617   BILITOT 0.8 04/30/2023 1400   BILITOT 0.3 09/08/2016 1648   GFRNONAA 61 11/29/2020 1155   GFRAA 71 11/29/2020 1155    Lab Results  Component Value Date   WBC 3.6 (L) 04/30/2023   HGB 12.0 04/30/2023   HCT 36.7 04/30/2023   MCV 81.9 04/30/2023   PLT 132 (L) 04/30/2023   Lab Results  Component Value Date   CHOL 170 11/17/2022   HDL 57 11/17/2022   LDLCALC 91 11/17/2022   TRIG 128 11/17/2022   CHOLHDL 3.0 11/17/2022   Lab Results  Component Value Date   HGBA1C 5.0 04/01/2021    Lab Results  Component Value Date   TSH 1.83 11/17/2022    Assessment & Plan:   Acute Lower Respiratory Infection; Reactive Airways Disease: Given 1 g Rocephin IM today.  Sending in 250 mg Azithromycin - take 2 tablets on Day 1 followed by1 tablet on Days 2-5. Stay well rested, well hydrated, and well nourished. Walk around some to prevent atelectasis. Contact us if symptoms worsen/persist despite treatment.   DMARD therapy with Enbrel.Dx psoriatric arthritis  Essential HTN-stable    CKD stage 3-stable    I,Emily Lagle,acting as a scribe for Margaree Mackintosh, MD.,have documented all relevant documentation on the behalf of Margaree Mackintosh, MD,as directed by  Margaree Mackintosh, MD while in the presence of Margaree Mackintosh, MD.   I, Margaree Mackintosh, MD, have reviewed all documentation for this visit. The documentation on 08/05/23 for the exam, diagnosis, procedures, and orders are all accurate and complete.

## 2023-08-05 ENCOUNTER — Ambulatory Visit: Payer: BC Managed Care – PPO | Admitting: Internal Medicine

## 2023-08-05 ENCOUNTER — Other Ambulatory Visit (HOSPITAL_BASED_OUTPATIENT_CLINIC_OR_DEPARTMENT_OTHER): Payer: Self-pay

## 2023-08-05 ENCOUNTER — Encounter: Payer: Self-pay | Admitting: Internal Medicine

## 2023-08-05 ENCOUNTER — Other Ambulatory Visit (HOSPITAL_COMMUNITY): Payer: Self-pay

## 2023-08-05 ENCOUNTER — Other Ambulatory Visit: Payer: Self-pay

## 2023-08-05 VITALS — BP 110/70 | HR 84 | Temp 98.5°F | Ht 68.0 in | Wt 235.0 lb

## 2023-08-05 DIAGNOSIS — R059 Cough, unspecified: Secondary | ICD-10-CM

## 2023-08-05 DIAGNOSIS — J22 Unspecified acute lower respiratory infection: Secondary | ICD-10-CM | POA: Diagnosis not present

## 2023-08-05 DIAGNOSIS — N1831 Chronic kidney disease, stage 3a: Secondary | ICD-10-CM

## 2023-08-05 DIAGNOSIS — I10 Essential (primary) hypertension: Secondary | ICD-10-CM | POA: Diagnosis not present

## 2023-08-05 DIAGNOSIS — L4050 Arthropathic psoriasis, unspecified: Secondary | ICD-10-CM

## 2023-08-05 MED ORDER — AZITHROMYCIN 250 MG PO TABS
ORAL_TABLET | ORAL | 0 refills | Status: AC
  Filled 2023-08-05 (×2): qty 6, 5d supply, fill #0

## 2023-08-05 MED ORDER — CEFTRIAXONE SODIUM 1 G IJ SOLR
1.0000 g | Freq: Once | INTRAMUSCULAR | Status: AC
  Administered 2023-08-05: 1 g via INTRAMUSCULAR

## 2023-08-05 NOTE — Patient Instructions (Addendum)
 Given 1 g IM Rocephin today and sending in azithromycin to take 2 tabs day 1 followed by 1 tab days 2 through 5.  Rest and stay well-hydrated.  Walk some to prevent atelectasis and call if symptoms fail to improve within a few days despite treatment.  Continue DMARD therapy.  Blood pressure is stable on current regimen.

## 2023-08-09 ENCOUNTER — Other Ambulatory Visit: Payer: Self-pay

## 2023-08-10 ENCOUNTER — Other Ambulatory Visit: Payer: Self-pay

## 2023-08-10 ENCOUNTER — Other Ambulatory Visit: Payer: Self-pay | Admitting: *Deleted

## 2023-08-10 DIAGNOSIS — L4050 Arthropathic psoriasis, unspecified: Secondary | ICD-10-CM

## 2023-08-10 MED ORDER — ENBREL MINI 50 MG/ML ~~LOC~~ SOCT: 50.0000 mg | SUBCUTANEOUS | 0 refills | Status: DC

## 2023-08-10 NOTE — Telephone Encounter (Signed)
 Refill request received via fax from [facility] for Enbrel  Last Fill: 06/29/2023 (30 day supply)  Labs: 04/30/2023 Creatinine is borderline elevated-1.08-improved.  GFR is low-56. CBC stable-WBC count remains slightly low but has improved. Absolute neutrophils are borderline low but improving.   Plt count remains low but stable.  TB Gold: 04/30/2023 Neg    Next Visit: 11/24/2023  Last Visit: 06/24/2023   DX: Psoriatic arthropathy   Current Dose per office note 06/24/2023: Enbrel 50 mg subcutaneous injections once weekly.   Left message to advise patient she is due to update her lab work.   Okay to refill Enbrel?

## 2023-08-11 ENCOUNTER — Other Ambulatory Visit (HOSPITAL_COMMUNITY): Payer: Self-pay

## 2023-08-12 ENCOUNTER — Other Ambulatory Visit (HOSPITAL_COMMUNITY): Payer: Self-pay

## 2023-08-12 ENCOUNTER — Encounter: Payer: Self-pay | Admitting: Rheumatology

## 2023-08-13 ENCOUNTER — Other Ambulatory Visit: Payer: Self-pay | Admitting: *Deleted

## 2023-08-13 ENCOUNTER — Other Ambulatory Visit: Payer: Self-pay

## 2023-08-13 ENCOUNTER — Telehealth: Payer: Self-pay | Admitting: Rheumatology

## 2023-08-13 DIAGNOSIS — Z79899 Other long term (current) drug therapy: Secondary | ICD-10-CM

## 2023-08-13 NOTE — Telephone Encounter (Signed)
 Lab Orders released.

## 2023-08-13 NOTE — Telephone Encounter (Signed)
 Patient contacted the office requesting lab orders to be be release to [facility] in [facility]..   Patient plans to have labs today, 08/13/23 at 11:00 am.

## 2023-08-17 DIAGNOSIS — Z79899 Other long term (current) drug therapy: Secondary | ICD-10-CM | POA: Diagnosis not present

## 2023-08-18 LAB — CBC WITH DIFFERENTIAL/PLATELET
Absolute Lymphocytes: 2177 {cells}/uL (ref 850–3900)
Absolute Monocytes: 445 {cells}/uL (ref 200–950)
Basophils Absolute: 30 {cells}/uL (ref 0–200)
Basophils Relative: 0.8 %
Eosinophils Absolute: 61 {cells}/uL (ref 15–500)
Eosinophils Relative: 1.6 %
HCT: 35.9 % (ref 35.0–45.0)
Hemoglobin: 11.8 g/dL (ref 11.7–15.5)
MCH: 27.1 pg (ref 27.0–33.0)
MCHC: 32.9 g/dL (ref 32.0–36.0)
MCV: 82.3 fL (ref 80.0–100.0)
MPV: 11 fL (ref 7.5–12.5)
Monocytes Relative: 11.7 %
Neutro Abs: 1087 {cells}/uL — ABNORMAL LOW (ref 1500–7800)
Neutrophils Relative %: 28.6 %
Platelets: 153 10*3/uL (ref 140–400)
RBC: 4.36 10*6/uL (ref 3.80–5.10)
RDW: 14.1 % (ref 11.0–15.0)
Total Lymphocyte: 57.3 %
WBC: 3.8 10*3/uL (ref 3.8–10.8)

## 2023-08-18 LAB — COMPLETE METABOLIC PANEL WITH GFR
AG Ratio: 1.3 (calc) (ref 1.0–2.5)
ALT: 17 U/L (ref 6–29)
AST: 19 U/L (ref 10–35)
Albumin: 4 g/dL (ref 3.6–5.1)
Alkaline phosphatase (APISO): 75 U/L (ref 37–153)
BUN: 13 mg/dL (ref 7–25)
CO2: 27 mmol/L (ref 20–32)
Calcium: 8.8 mg/dL (ref 8.6–10.4)
Chloride: 108 mmol/L (ref 98–110)
Creat: 0.96 mg/dL (ref 0.60–1.00)
Globulin: 3 g/dL (ref 1.9–3.7)
Glucose, Bld: 90 mg/dL (ref 65–99)
Potassium: 4.3 mmol/L (ref 3.5–5.3)
Sodium: 141 mmol/L (ref 135–146)
Total Bilirubin: 0.5 mg/dL (ref 0.2–1.2)
Total Protein: 7 g/dL (ref 6.1–8.1)
eGFR: 64 mL/min/{1.73_m2} (ref >=60)

## 2023-08-18 NOTE — Progress Notes (Signed)
 CMP WNL. Absolute neutrophils are borderline low but are trending up. Rest of CBC WNL.  We will continue to monitor.  Rest of CBC WNL.

## 2023-08-23 ENCOUNTER — Other Ambulatory Visit (HOSPITAL_COMMUNITY): Payer: Self-pay

## 2023-08-23 DIAGNOSIS — F902 Attention-deficit hyperactivity disorder, combined type: Secondary | ICD-10-CM | POA: Diagnosis not present

## 2023-08-23 DIAGNOSIS — F422 Mixed obsessional thoughts and acts: Secondary | ICD-10-CM | POA: Diagnosis not present

## 2023-08-23 DIAGNOSIS — F411 Generalized anxiety disorder: Secondary | ICD-10-CM | POA: Diagnosis not present

## 2023-08-23 DIAGNOSIS — F331 Major depressive disorder, recurrent, moderate: Secondary | ICD-10-CM | POA: Diagnosis not present

## 2023-08-23 MED ORDER — AMPHETAMINE-DEXTROAMPHET ER 20 MG PO CP24: 20.0000 mg | ORAL_CAPSULE | Freq: Two times a day (BID) | ORAL | 0 refills | Status: DC

## 2023-08-23 MED ORDER — CLONIDINE HCL 0.1 MG PO TABS: 0.1000 mg | ORAL_TABLET | Freq: Every day | ORAL | 2 refills | Status: DC

## 2023-08-23 MED ORDER — AMPHETAMINE-DEXTROAMPHET ER 20 MG PO CP24
20.0000 mg | ORAL_CAPSULE | Freq: Two times a day (BID) | ORAL | 0 refills | Status: DC
  Filled 2023-08-23: qty 60, 30d supply, fill #0

## 2023-08-24 ENCOUNTER — Other Ambulatory Visit: Payer: Self-pay

## 2023-08-30 ENCOUNTER — Other Ambulatory Visit: Payer: Self-pay | Admitting: Physician Assistant

## 2023-08-30 DIAGNOSIS — L4050 Arthropathic psoriasis, unspecified: Secondary | ICD-10-CM

## 2023-08-30 NOTE — Telephone Encounter (Signed)
 Last Fill: 08/10/2023 (30 day supply)  Labs: 08/17/2023 CMP WNL. Absolute neutrophils are borderline low but are trending up. Rest of CBC WNL.   Rest of CBC WNL.     TB Gold: 04/30/2023 Neg    Next Visit: 11/24/2023  Last Visit: 06/24/2023  [id number] arthropathy   Current Dose per office note 06/24/2023: Enbrel 50 mg subcutaneous injections once weekly   Okay to refill Enbrel?

## 2023-09-01 ENCOUNTER — Ambulatory Visit: Payer: BC Managed Care – PPO | Attending: Physician Assistant | Admitting: Physician Assistant

## 2023-09-01 ENCOUNTER — Other Ambulatory Visit (HOSPITAL_COMMUNITY): Payer: Self-pay

## 2023-09-01 ENCOUNTER — Encounter: Payer: Self-pay | Admitting: Physician Assistant

## 2023-09-01 VITALS — BP 127/70 | HR 92 | Ht 68.5 in | Wt 236.8 lb

## 2023-09-01 DIAGNOSIS — E781 Pure hyperglyceridemia: Secondary | ICD-10-CM

## 2023-09-01 DIAGNOSIS — I10 Essential (primary) hypertension: Secondary | ICD-10-CM

## 2023-09-01 DIAGNOSIS — I2510 Atherosclerotic heart disease of native coronary artery without angina pectoris: Secondary | ICD-10-CM

## 2023-09-01 DIAGNOSIS — I350 Nonrheumatic aortic (valve) stenosis: Secondary | ICD-10-CM | POA: Diagnosis not present

## 2023-09-01 DIAGNOSIS — R002 Palpitations: Secondary | ICD-10-CM

## 2023-09-01 HISTORY — DX: Atherosclerotic heart disease of native coronary artery without angina pectoris: I25.10

## 2023-09-01 HISTORY — DX: Nonrheumatic aortic (valve) stenosis: I35.0

## 2023-09-01 NOTE — Assessment & Plan Note (Signed)
 Mild to moderate aortic stenosis on recent echocardiogram. - Repeat echo in 2 years

## 2023-09-01 NOTE — Patient Instructions (Signed)
 Medication Instructions:  Your physician recommends that you continue on your current medications as directed. Please refer to the Current Medication list given to you today.  *If you need a refill on your cardiac medications before your next appointment, please call your pharmacy*   Lab Work: None ordered  If you have labs (blood work) drawn today and your tests are completely normal, you will receive your results only by: MyChart Message (if you have MyChart) OR A paper copy in the mail If you have any lab test that is abnormal or we need to change your treatment, we will call you to review the results.   Testing/Procedures: Your physician has requested that you have an echocardiogram IN 2 YEARS. Echocardiography is a painless test that uses sound waves to create images of your heart. It provides your doctor with information about the size and shape of your heart and how well your heart's chambers and valves are working. This procedure takes approximately one hour. There are no restrictions for this procedure. Please do NOT wear cologne, perfume, aftershave, or lotions (deodorant is allowed). Please arrive 15 minutes prior to your appointment time.  Please note: We ask at that you not bring children with you during ultrasound (echo/ vascular) testing. Due to room size and safety concerns, children are not allowed in the ultrasound rooms during exams. Our front office staff cannot provide observation of children in our lobby area while testing is being conducted. An adult accompanying a patient to their appointment will only be allowed in the ultrasound room at the discretion of the ultrasound technician under special circumstances. We apologize for any inconvenience.    Follow-Up: At [facility], you and your health needs are our priority.  As part of our continuing mission to provide you with exceptional heart care, we have created designated Provider Care Teams.  These Care  Teams include your primary Cardiologist (physician) and Advanced Practice Providers (APPs -  Physician Assistants and Nurse Practitioners) who all work together to provide you with the care you need, when you need it.  We recommend signing up for the patient portal called "MyChart".  Sign up information is provided on this After Visit Summary.  MyChart is used to connect with patients for Virtual Visits (Telemedicine).  Patients are able to view lab/test results, encounter notes, upcoming appointments, etc.  Non-urgent messages can be sent to your provider as well.   To learn more about what you can do with MyChart, go to [email or web address].    Your next appointment:   11/2023   Provider:   Tessa Lerner, DO     Other Instructions

## 2023-09-01 NOTE — Progress Notes (Signed)
 Cardiology Office Note:    Date:  09/01/2023  ID:  Sarah Horton, DOB [birth year], MRN [id number] PCP: Margaree Mackintosh, MD  [facility] Providers Cardiologist:  Tessa Lerner, DO       Patient Profile:      Coronary artery disease Mild nonobstructive-CCTA 06/16/2023: CAC score 0; mild total plaque volume 41 mm (23rd percentile-calcified 0 mm, noncalcified 41 mm; LCx proximal 30-49 (soft plaque), OM1/artifact Aortic stenosis TTE 06/25/2023: EF 60-65, no RWMA, GLS -24, normal RVSF, normal PASP, trivial MR, mild-moderate AS (mean gradient 12 mmHg, V-max 214 cm/s DI 0.48) Psoriatic arthritis  Hypertension Hypertriglyceridemia Hypothyroidism Venous insufficiency  Obesity          Discussed the use of AI scribe software for clinical note transcription with the patient, who gave verbal consent to proceed.  History of Present Illness Sarah Horton is a 73 y.o. female who returns for follow up of AS, CAD. She was evaluated by Dr. Odis Hollingshead 05/2023 for shortness of breath, chest pain. CCTA demonstrated mild nonobstructive coronary artery disease. TTE showed mild to mod AS with mean gradient 12 mmHg. Notes from Dr. Odis Hollingshead reviewed. It was suggested to consider Zio monitor if CCTA and TTE unremarkable.   She is here alone. She manages her cholesterol through diet and exercise, including taking omega-3 supplements and reducing ice cream intake, which previously helped lower her triglycerides. She recalls being told about a heart murmur in the past. She experiences palpitations, particularly when exerting herself, such as when climbing stairs or carrying heavy items, which leads to shortness of breath. Her heart rate can increase significantly during these times but does not experience palpitations at rest. She is actively trying to increase her physical activity, including using stairs frequently and engaging in Pilates. No significant changes in her breathing, no orthopnea, no  syncope, and no leg swelling. She is in the process of weaning off clonidine.   ROS-See HPI    Studies Reviewed:       Results LABS Total Cholesterol: 177 mg/dL (40/9811) HDL: 53 mg/dL (91/4782) LDL: 93 mg/dL (95/6213) Triglycerides: 181 mg/dL (01/6577)    Risk Assessment/Calculations:             Physical Exam:   VS:  BP 127/70   Pulse 92   Ht 5' 8.5" (1.74 m)   Wt 236 lb 12.8 oz (107.4 kg)   SpO2 98%   BMI 35.48 kg/m    Wt Readings from Last 3 Encounters:  09/01/23 236 lb 12.8 oz (107.4 kg)  08/05/23 235 lb (106.6 kg)  06/24/23 235 lb (106.6 kg)    Constitutional:      Appearance: Healthy appearance. Not in distress.  Neck:     Vascular: JVD normal.  Pulmonary:     Breath sounds: Normal breath sounds. No wheezing. No rales.  Cardiovascular:     Normal rate. Regular rhythm.     Murmurs: There is a grade 2/6 systolic murmur at the URSB.  Edema:    Peripheral edema absent.  Abdominal:     Palpations: Abdomen is soft.        Assessment and Plan:   Assessment & Plan Coronary artery disease involving native coronary artery of native heart without angina pectoris Mild nonobstructive coronary artery disease with mild total plaque volume and 30-49% soft plaque in the proximal LCX. Calcium score is zero.  We discussed the importance of risk factor management.  We discussed the addition of the statin to reduce her LDL  cholesterol.  She prefers to manage through diet and exercise rather than starting statin therapy at this time. - Continue dietary management and exercise to control cholesterol levels. - Recheck lipids with primary care in a few months. - If LDL not below 70, consider the addition of statin therapy. - Follow up with Dr Odis Hollingshead in June. Nonrheumatic aortic valve stenosis Mild to moderate aortic stenosis on recent echocardiogram. - Repeat echo in 2 years Hypertriglyceridemia LDL cholesterol is 93 mg/dL. Goal is to reduce LDL to less than 70 mg/dL. She  prefers to manage through diet and exercise rather than starting statin therapy. Discussed the potential benefits of statin therapy for prevention, but she opts for lifestyle modifications first. Benign hypertension Blood pressure is well controlled. She is weaning off clonidine and continues metoprolol 25 mg daily.  - Continue metoprolol 25 mg daily. Palpitations Palpitations mainly occur during exertion.  This is likely physiologic.  We discussed +/- proceeding with event monitoring.  At this point, I do not think she needs to do this.  She will continue to monitor symptoms and let us know if they should change.     Dispo:  Return in about 3 months (around 12/02/2023) for Scheduled Follow Up, w/ Dr. Odis Hollingshead.  Signed, Tereso Newcomer, PA-C

## 2023-09-01 NOTE — Assessment & Plan Note (Signed)
 Mild nonobstructive coronary artery disease with mild total plaque volume and 30-49% soft plaque in the proximal LCX. Calcium score is zero.  We discussed the importance of risk factor management.  We discussed the addition of the statin to reduce her LDL cholesterol.  She prefers to manage through diet and exercise rather than starting statin therapy at this time. - Continue dietary management and exercise to control cholesterol levels. - Recheck lipids with primary care in a few months. - If LDL not below 70, consider the addition of statin therapy. - Follow up with Dr Odis Hollingshead in June.

## 2023-09-16 DIAGNOSIS — Z1231 Encounter for screening mammogram for malignant neoplasm of breast: Secondary | ICD-10-CM | POA: Diagnosis not present

## 2023-09-16 LAB — HM MAMMOGRAPHY

## 2023-09-23 ENCOUNTER — Other Ambulatory Visit (HOSPITAL_COMMUNITY): Payer: Self-pay

## 2023-10-13 ENCOUNTER — Other Ambulatory Visit (HOSPITAL_COMMUNITY): Payer: Self-pay

## 2023-10-14 ENCOUNTER — Other Ambulatory Visit (HOSPITAL_COMMUNITY): Payer: Self-pay

## 2023-10-14 DIAGNOSIS — H43391 Other vitreous opacities, right eye: Secondary | ICD-10-CM | POA: Diagnosis not present

## 2023-10-14 DIAGNOSIS — H43392 Other vitreous opacities, left eye: Secondary | ICD-10-CM | POA: Diagnosis not present

## 2023-10-14 DIAGNOSIS — H3322 Serous retinal detachment, left eye: Secondary | ICD-10-CM | POA: Diagnosis not present

## 2023-10-14 DIAGNOSIS — H43393 Other vitreous opacities, bilateral: Secondary | ICD-10-CM | POA: Diagnosis not present

## 2023-10-14 DIAGNOSIS — H0100A Unspecified blepharitis right eye, upper and lower eyelids: Secondary | ICD-10-CM | POA: Diagnosis not present

## 2023-10-14 DIAGNOSIS — H43812 Vitreous degeneration, left eye: Secondary | ICD-10-CM | POA: Diagnosis not present

## 2023-10-14 DIAGNOSIS — H04123 Dry eye syndrome of bilateral lacrimal glands: Secondary | ICD-10-CM | POA: Diagnosis not present

## 2023-10-16 ENCOUNTER — Other Ambulatory Visit (HOSPITAL_COMMUNITY): Payer: Self-pay

## 2023-10-18 DIAGNOSIS — E669 Obesity, unspecified: Secondary | ICD-10-CM | POA: Diagnosis not present

## 2023-10-18 DIAGNOSIS — I129 Hypertensive chronic kidney disease with stage 1 through stage 4 chronic kidney disease, or unspecified chronic kidney disease: Secondary | ICD-10-CM | POA: Diagnosis not present

## 2023-10-18 DIAGNOSIS — N1832 Chronic kidney disease, stage 3b: Secondary | ICD-10-CM | POA: Diagnosis not present

## 2023-10-18 DIAGNOSIS — E039 Hypothyroidism, unspecified: Secondary | ICD-10-CM | POA: Diagnosis not present

## 2023-10-19 ENCOUNTER — Other Ambulatory Visit (HOSPITAL_COMMUNITY): Payer: Self-pay

## 2023-10-19 ENCOUNTER — Other Ambulatory Visit: Payer: Self-pay

## 2023-10-19 LAB — COMPREHENSIVE METABOLIC PANEL WITH GFR
Albumin: 4.2 (ref 3.5–5.0)
Calcium: 9.6 (ref 8.7–10.7)
eGFR: 51

## 2023-10-19 LAB — BASIC METABOLIC PANEL WITH GFR
BUN: 20 (ref 4–21)
CO2: 23 — AB (ref 13–22)
Chloride: 106 (ref 99–108)
Creatinine: 1.2 — AB (ref 0.5–1.1)
Glucose: 97
Potassium: 4.5 meq/L (ref 3.5–5.1)
Sodium: 142 (ref 137–147)

## 2023-10-19 LAB — LAB REPORT - SCANNED: PTH, Intact: 26

## 2023-10-19 LAB — VITAMIN D 25 HYDROXY (VIT D DEFICIENCY, FRACTURES): Vit D, 25-Hydroxy: 64.2

## 2023-10-19 MED ORDER — METOPROLOL SUCCINATE ER 25 MG PO TB24
25.0000 mg | ORAL_TABLET | Freq: Every day | ORAL | 5 refills | Status: DC
  Filled 2023-10-19: qty 30, 30d supply, fill #0
  Filled 2023-12-26: qty 30, 30d supply, fill #1

## 2023-10-20 ENCOUNTER — Other Ambulatory Visit (HOSPITAL_COMMUNITY): Payer: Self-pay

## 2023-10-25 ENCOUNTER — Other Ambulatory Visit (HOSPITAL_COMMUNITY): Payer: Self-pay

## 2023-10-28 DIAGNOSIS — H3322 Serous retinal detachment, left eye: Secondary | ICD-10-CM | POA: Diagnosis not present

## 2023-10-28 DIAGNOSIS — Z961 Presence of intraocular lens: Secondary | ICD-10-CM | POA: Diagnosis not present

## 2023-10-28 DIAGNOSIS — H43812 Vitreous degeneration, left eye: Secondary | ICD-10-CM | POA: Diagnosis not present

## 2023-11-01 ENCOUNTER — Ambulatory Visit: Payer: Self-pay | Admitting: Cardiology

## 2023-11-10 NOTE — Progress Notes (Deleted)
 Office Visit Note  Patient: Sarah Horton             Date of Birth: 1950-08-21           MRN: [id number]             PCP: Sylvan Evener, MD Referring: Sylvan Evener, MD Visit Date: 11/24/2023 Occupation: @GUAROCC @  Subjective:    History of Present Illness: Sarah Horton is a 73 y.o. female with history of psoriatic arthritis and osteoarthritis.  Patient remains on  Enbrel  50 mg subcutaneous injections once weekly   Renal panel updated on 10/18/23. TB gold negative on 04/30/23.  Discussed the importance of holding enbrel  if she develops signs or symptoms of an infection and to resume once the infection has completely cleared.   Activities of Daily Living:  Patient reports morning stiffness for *** {minute/hour:19697}.   Patient {ACTIONS;DENIES/REPORTS:21021675::Denies} nocturnal pain.  Difficulty dressing/grooming: {ACTIONS;DENIES/REPORTS:21021675::Denies} Difficulty climbing stairs: {ACTIONS;DENIES/REPORTS:21021675::Denies} Difficulty getting out of chair: {ACTIONS;DENIES/REPORTS:21021675::Denies} Difficulty using hands for taps, buttons, cutlery, and/or writing: {ACTIONS;DENIES/REPORTS:21021675::Denies}  No Rheumatology ROS completed.   PMFS History:  Patient Active Problem List   Diagnosis Date Noted   CAD (coronary artery disease) 09/01/2023   Aortic stenosis 09/01/2023   History of peripheral neuropathy 11/20/2016   Left wrist tendonitis/ history of surgery for Dequervains  11/20/2016   Raynaud's disease without gangrene 09/22/2016   High risk medication use 09/22/2016   History of ulcer of lower limb/ Venous stasis ulcer 01/2015  09/22/2016   Osteopenia of multiple sites 09/22/2016   Psoriasis 09/03/2016   Chest wall pain 07/16/2016   Flank pain, acute 05/15/2016   Subacromial bursitis of right shoulder joint 04/10/2016   Low back pain 04/09/2016   Pre-ulcerative calluses 02/11/2016   Varicose veins of right lower extremity with  complications 08/12/2015   Elevated BP 08/09/2015   Varicose veins of left lower extremity with complications 07/29/2015   Varicose veins of bilateral lower extremities with other complications 07/09/2015   Varicose veins of lower extremities with ulcer (HCC) 04/05/2015   Status post bilateral knee replacements 12/13/2013   Venous stasis ulcer of ankle (HCC) 06/06/2013   ADD (attention deficit disorder) 04/04/2012   INSOMNIA 06/20/2010   Hypothyroidism 01/17/2010   Obesity 01/17/2010   Osteoarthritis 01/17/2010   ANEMIA 10/02/2009   SINUSITIS, ACUTE 10/02/2009   Asthma 10/02/2009   Venous (peripheral) insufficiency 03/11/2008   Allergic rhinitis 03/11/2008   BRONCHITIS, RECURRENT 03/11/2008   History of migraine 03/11/2008   Anxiety state 11/24/2007   Hereditary and idiopathic peripheral neuropathy 11/24/2007   GERD 11/24/2007   IRRITABLE BOWEL SYNDROME 11/24/2007   PSORIATIC ARTHROPATHY 11/24/2007    Past Medical History:  Diagnosis Date   Allergic rhinitis, cause unspecified    Allergy    Anemia, unspecified    Anxiety state, unspecified    Aortic stenosis 09/01/2023   TTE 06/25/2023: EF 60-65, no RWMA, GLS -24, normal RVSF, normal PASP, trivial MR, mild-moderate AS (mean gradient 12 mmHg, V-max 214 cm/s DI 0.48)    Arthritis    CAD (coronary artery disease) 09/01/2023   Mild nonobstructive-CCTA 06/16/2023: CAC score 0; mild total plaque volume 41 mm (23rd percentile-calcified 0 mm, noncalcified 41 mm; LCx proximal 30-49 (soft plaque), OM1/artifact    Cataract    bilaterally removed 2003,2007   Esophageal reflux    Irritable bowel syndrome    Migraine    Obesity, unspecified    Psoriatic arthropathy (HCC)    Unspecified chronic bronchitis (HCC)  Unspecified hereditary and idiopathic peripheral neuropathy    Unspecified hypothyroidism    Unspecified venous (peripheral) insufficiency     Family History  Problem Relation Age of Onset   COPD Mother    Heart  disease Mother    Hypertension Mother    Varicose Veins Mother    Cirrhosis Father    Thyroid  disease Sister    Breast cancer Maternal Grandmother    Breast cancer Other        1st cousin    Pancreatic cancer Other        1st cousin    Lymphoma Cousin    Colon cancer Neg Hx    Colon polyps Neg Hx    Esophageal cancer Neg Hx    Rectal cancer Neg Hx    Stomach cancer Neg Hx    Past Surgical History:  Procedure Laterality Date   ABDOMINAL HYSTERECTOMY     ABDOMINAL HYSTERECTOMY     ABLATION ON ENDOMETRIOSIS     x2   Bilat TKRs  04/2010   by [name]   BUNIONECTOMY  2009   CATARACT EXTRACTION, BILATERAL     2003,2007   COLONOSCOPY  08-26-2004   with gessner tics and hems    dequervains tensenovitis Left    JOINT REPLACEMENT Bilateral    knees   NASAL SINUS SURGERY     SHOULDER SURGERY  2005   TOTAL SHOULDER ARTHROPLASTY     Social History   Social History Narrative   Not on file   Immunization History  Administered Date(s) Administered   Fluad Quad(high Dose 65+) 05/16/2022   H1N1 05/18/2008   Influenza Inj Mdck Quad Pf 04/01/2017   Influenza Inj Mdck Quad With Preservative 04/13/2018   Influenza Split 03/23/2011, 03/14/2012, 03/23/2013, 03/22/2016   Influenza Whole 03/08/2008, 03/08/2009, 03/07/2010   Influenza,inj,Quad PF,6+ Mos 03/21/2014, 03/31/2019, 04/03/2021   PFIZER(Purple Top)SARS-COV-2 Vaccination 08/22/2019, 09/12/2019   PNEUMOCOCCAL CONJUGATE-20 11/20/2022   Pneumococcal Conjugate-13 06/28/2019   Td 01/06/2002   Tdap 03/14/2012   Zoster Recombinant(Shingrix) 03/08/2018     Objective: Vital Signs: There were no vitals taken for this visit.   Physical Exam Vitals and nursing note reviewed.  Constitutional:      Appearance: She is well-developed.  HENT:     Head: Normocephalic and atraumatic.   Eyes:     Conjunctiva/sclera: Conjunctivae normal.    Cardiovascular:     Rate and Rhythm: Normal rate and regular rhythm.     Heart sounds:  Normal heart sounds.  Pulmonary:     Effort: Pulmonary effort is normal.     Breath sounds: Normal breath sounds.  Abdominal:     General: Bowel sounds are normal.     Palpations: Abdomen is soft.   Musculoskeletal:     Cervical back: Normal range of motion.  Lymphadenopathy:     Cervical: No cervical adenopathy.   Skin:    General: Skin is warm and dry.     Capillary Refill: Capillary refill takes less than 2 seconds.   Neurological:     Mental Status: She is alert and oriented to person, place, and time.   Psychiatric:        Behavior: Behavior normal.      Musculoskeletal Exam: ***  CDAI Exam: CDAI Score: -- Patient Global: --; Provider Global: -- Swollen: --; Tender: -- Joint Exam 11/24/2023   No joint exam has been documented for this visit   There is currently no information documented on the homunculus. Go to the  Rheumatology activity and complete the homunculus joint exam.  Investigation: No additional findings.  Imaging: No results found.  Recent Labs: Lab Results  Component Value Date   WBC 3.8 08/17/2023   HGB 11.8 08/17/2023   PLT 153 08/17/2023   NA 141 08/17/2023   K 4.3 08/17/2023   CL 108 08/17/2023   CO2 27 08/17/2023   GLUCOSE 90 08/17/2023   BUN 13 08/17/2023   CREATININE 0.96 08/17/2023   BILITOT 0.5 08/17/2023   ALKPHOS 87 01/25/2017   AST 19 08/17/2023   ALT 17 08/17/2023   PROT 7.0 08/17/2023   ALBUMIN 4.2 10/13/2022   CALCIUM 8.8 08/17/2023   GFRAA 71 11/29/2020   QFTBGOLDPLUS NEGATIVE 04/30/2023    Speciality Comments: Prior therapy: Methotrexate  (elevated creatinine),Arava -pt dcd  Procedures:  No procedures performed Allergies: Nucynta [tapentadol], Codeine, Penicillins, Tessalon  [benzonatate ], and Other   Assessment / Plan:     Visit Diagnoses: Psoriatic arthropathy (HCC)  Psoriasis  High risk medication use  Primary osteoarthritis of both hands  Trochanteric bursitis, left hip  Status post bilateral knee  replacements  Sacroiliitis (HCC)  Raynaud's disease without gangrene  Osteopenia of multiple sites  History of hypertension  History of gastroesophageal reflux (GERD)  History of ulcer of lower limb/ Venous stasis ulcer 01/2015   History of hypothyroidism  History of asthma  History of migraine  History of IBS  Primary insomnia  Hereditary and idiopathic peripheral neuropathy  Orders: No orders of the defined types were placed in this encounter.  No orders of the defined types were placed in this encounter.   Face-to-face time spent with patient was *** minutes. Greater than 50% of time was spent in counseling and coordination of care.  Follow-Up Instructions: No follow-ups on file.   Romayne Clubs, PA-C  Note - This record has been created using Dragon software.  Chart creation errors have been sought, but may not always  have been located. Such creation errors do not reflect on  the standard of medical care.

## 2023-11-12 ENCOUNTER — Other Ambulatory Visit: Payer: Self-pay | Admitting: Physician Assistant

## 2023-11-12 DIAGNOSIS — L4050 Arthropathic psoriasis, unspecified: Secondary | ICD-10-CM

## 2023-11-12 NOTE — Telephone Encounter (Signed)
 Last Fill: 08/30/2023  Labs: 08/17/2023 CMP WNL. Absolute neutrophils are borderline low but are trending up. Rest of CBC WNL.  We will continue to monitor.  Rest of CBC WNL.    Will update labs at next appointment  TB Gold: 04/30/2023   Next Visit: 11/24/2023  Last Visit: 06/24/2023  [id number] arthropathy   Current Dose per office note 06/24/2023: Enbrel  50 mg subcutaneous injections once weekly.   Okay to refill Enbrel ?

## 2023-11-15 DIAGNOSIS — L814 Other melanin hyperpigmentation: Secondary | ICD-10-CM | POA: Diagnosis not present

## 2023-11-15 DIAGNOSIS — L720 Epidermal cyst: Secondary | ICD-10-CM | POA: Diagnosis not present

## 2023-11-15 DIAGNOSIS — L821 Other seborrheic keratosis: Secondary | ICD-10-CM | POA: Diagnosis not present

## 2023-11-15 DIAGNOSIS — D1801 Hemangioma of skin and subcutaneous tissue: Secondary | ICD-10-CM | POA: Diagnosis not present

## 2023-11-23 ENCOUNTER — Telehealth: Payer: Self-pay

## 2023-11-23 ENCOUNTER — Other Ambulatory Visit: Payer: BC Managed Care – PPO

## 2023-11-23 DIAGNOSIS — K219 Gastro-esophageal reflux disease without esophagitis: Secondary | ICD-10-CM

## 2023-11-23 DIAGNOSIS — Z0000 Encounter for general adult medical examination without abnormal findings: Secondary | ICD-10-CM

## 2023-11-23 DIAGNOSIS — E063 Autoimmune thyroiditis: Secondary | ICD-10-CM

## 2023-11-23 DIAGNOSIS — Z13220 Encounter for screening for lipoid disorders: Secondary | ICD-10-CM

## 2023-11-23 DIAGNOSIS — N183 Chronic kidney disease, stage 3 unspecified: Secondary | ICD-10-CM

## 2023-11-23 DIAGNOSIS — I10 Essential (primary) hypertension: Secondary | ICD-10-CM

## 2023-11-23 DIAGNOSIS — N1831 Chronic kidney disease, stage 3a: Secondary | ICD-10-CM

## 2023-11-23 NOTE — Telephone Encounter (Signed)
 Copied from CRM [id number]. Topic: Clinical - Request for Lab/Test Order >> Nov 23, 2023 10:58 AM Sanjuana Crutch wrote: Reason for CRM: patient called in to reschedule labs because she over slept, needs dr.baxley to request another order, rescheduled physical appointment as well

## 2023-11-24 ENCOUNTER — Ambulatory Visit: Payer: BC Managed Care – PPO | Admitting: Physician Assistant

## 2023-11-24 DIAGNOSIS — Z8679 Personal history of other diseases of the circulatory system: Secondary | ICD-10-CM

## 2023-11-24 DIAGNOSIS — Z79899 Other long term (current) drug therapy: Secondary | ICD-10-CM

## 2023-11-24 DIAGNOSIS — I7300 Raynaud's syndrome without gangrene: Secondary | ICD-10-CM

## 2023-11-24 DIAGNOSIS — Z8639 Personal history of other endocrine, nutritional and metabolic disease: Secondary | ICD-10-CM

## 2023-11-24 DIAGNOSIS — L409 Psoriasis, unspecified: Secondary | ICD-10-CM

## 2023-11-24 DIAGNOSIS — M7062 Trochanteric bursitis, left hip: Secondary | ICD-10-CM

## 2023-11-24 DIAGNOSIS — M461 Sacroiliitis, not elsewhere classified: Secondary | ICD-10-CM

## 2023-11-24 DIAGNOSIS — Z8719 Personal history of other diseases of the digestive system: Secondary | ICD-10-CM

## 2023-11-24 DIAGNOSIS — Z8709 Personal history of other diseases of the respiratory system: Secondary | ICD-10-CM

## 2023-11-24 DIAGNOSIS — F5101 Primary insomnia: Secondary | ICD-10-CM

## 2023-11-24 DIAGNOSIS — L4050 Arthropathic psoriasis, unspecified: Secondary | ICD-10-CM

## 2023-11-24 DIAGNOSIS — Z872 Personal history of diseases of the skin and subcutaneous tissue: Secondary | ICD-10-CM

## 2023-11-24 DIAGNOSIS — Z8669 Personal history of other diseases of the nervous system and sense organs: Secondary | ICD-10-CM

## 2023-11-24 DIAGNOSIS — M19041 Primary osteoarthritis, right hand: Secondary | ICD-10-CM

## 2023-11-24 DIAGNOSIS — M8589 Other specified disorders of bone density and structure, multiple sites: Secondary | ICD-10-CM

## 2023-11-24 DIAGNOSIS — Z96653 Presence of artificial knee joint, bilateral: Secondary | ICD-10-CM

## 2023-11-24 DIAGNOSIS — G609 Hereditary and idiopathic neuropathy, unspecified: Secondary | ICD-10-CM

## 2023-11-25 ENCOUNTER — Encounter: Payer: BC Managed Care – PPO | Admitting: Internal Medicine

## 2023-11-25 NOTE — Progress Notes (Addendum)
 Office Visit Note  Patient: Sarah Horton             Date of Birth: 07/23/1950           MRN: [id number]             PCP: Perri Ronal PARAS, MD Referring: Perri Ronal PARAS, MD Visit Date: 12/09/2023 Occupation: @GUAROCC @  Subjective:  Right shoulder pain   History of Present Illness: Sarah Horton is a 73 y.o. female with history of psoriatic arthritis and osteoarthritis.  Patient remains on  Enbrel  50 mg subcutaneous injections once weekly.  She is tolerating Enbrel  without any side effects.  She has not had any signs or symptoms of a psoriatic arthritis flare.  She denies any active psoriasis at this time.  She denies any Achilles tendinitis or plantar fasciitis.  She denies any inflammation at this time.  Patient states that she has had increased discomfort in the right shoulder.  Patient states that in 2005 she had a rotator cuff repair performed but recently started to have some increased discomfort with range of motion.  She has occasional discomfort at night especially when laying on her right side.  She has not yet followed back up with orthopedics. Patient was recently evaluated by dermatology for skin cancer screening.  Activities of Daily Living:  Patient reports morning stiffness for 5-10 minutes  Patient Reports nocturnal pain.  Difficulty dressing/grooming: Denies Difficulty climbing stairs: Denies Difficulty getting out of chair: Denies Difficulty using hands for taps, buttons, cutlery, and/or writing: Denies  Review of Systems  Constitutional:  Positive for fatigue.  HENT:  Negative for mouth sores, mouth dryness and nose dryness.   Eyes:  Positive for dryness. Negative for pain and visual disturbance.  Respiratory:  Positive for cough. Negative for hemoptysis, shortness of breath and difficulty breathing.   Cardiovascular:  Negative for chest pain, palpitations, hypertension and swelling in legs/feet.  Gastrointestinal:  Negative for blood in stool,  constipation and diarrhea.  Endocrine: Negative for increased urination.  Genitourinary:  Negative for painful urination.  Musculoskeletal:  Positive for joint pain, joint pain and morning stiffness. Negative for joint swelling, myalgias, muscle weakness, muscle tenderness and myalgias.  Skin:  Negative for color change, pallor, rash, hair loss, nodules/bumps, skin tightness, ulcers and sensitivity to sunlight.  Allergic/Immunologic: Negative for susceptible to infections.  Neurological:  Negative for dizziness, numbness, headaches and weakness.  Hematological:  Negative for swollen glands.  Psychiatric/Behavioral:  Positive for sleep disturbance. Negative for depressed mood. The patient is not nervous/anxious.     PMFS History:  Patient Active Problem List   Diagnosis Date Noted   CAD (coronary artery disease) 09/01/2023   Aortic stenosis 09/01/2023   History of peripheral neuropathy 11/20/2016   Left wrist tendonitis/ history of surgery for Dequervains  11/20/2016   Raynaud's disease without gangrene 09/22/2016   High risk medication use 09/22/2016   History of ulcer of lower limb/ Venous stasis ulcer 01/2015  09/22/2016   Osteopenia of multiple sites 09/22/2016   Psoriasis 09/03/2016   Chest wall pain 07/16/2016   Flank pain, acute 05/15/2016   Subacromial bursitis of right shoulder joint 04/10/2016   Low back pain 04/09/2016   Pre-ulcerative calluses 02/11/2016   Varicose veins of right lower extremity with complications 08/12/2015   Elevated BP 08/09/2015   Varicose veins of left lower extremity with complications 07/29/2015   Varicose veins of bilateral lower extremities with other complications 07/09/2015   Varicose veins of lower  extremities with ulcer (HCC) 04/05/2015   Status post bilateral knee replacements 12/13/2013   Venous stasis ulcer of ankle (HCC) 06/06/2013   ADD (attention deficit disorder) 04/04/2012   INSOMNIA 06/20/2010   Hypothyroidism 01/17/2010   Obesity  01/17/2010   Osteoarthritis 01/17/2010   ANEMIA 10/02/2009   SINUSITIS, ACUTE 10/02/2009   Asthma 10/02/2009   Venous (peripheral) insufficiency 03/11/2008   Allergic rhinitis 03/11/2008   BRONCHITIS, RECURRENT 03/11/2008   History of migraine 03/11/2008   Anxiety state 11/24/2007   Hereditary and idiopathic peripheral neuropathy 11/24/2007   GERD 11/24/2007   IRRITABLE BOWEL SYNDROME 11/24/2007   PSORIATIC ARTHROPATHY 11/24/2007    Past Medical History:  Diagnosis Date   Allergic rhinitis, cause unspecified    Allergy    Anemia, unspecified    Anxiety state, unspecified    Aortic stenosis 09/01/2023   TTE 06/25/2023: EF 60-65, no RWMA, GLS -24, normal RVSF, normal PASP, trivial MR, mild-moderate AS (mean gradient 12 mmHg, V-max 214 cm/s DI 0.48)    Arthritis    CAD (coronary artery disease) 09/01/2023   Mild nonobstructive-CCTA 06/16/2023: CAC score 0; mild total plaque volume 41 mm (23rd percentile-calcified 0 mm, noncalcified 41 mm; LCx proximal 30-49 (soft plaque), OM1/artifact    Cataract    bilaterally removed 2003,2007   Esophageal reflux    Irritable bowel syndrome    Migraine    Obesity, unspecified    Psoriatic arthropathy (HCC)    Unspecified chronic bronchitis (HCC)    Unspecified hereditary and idiopathic peripheral neuropathy    Unspecified hypothyroidism    Unspecified venous (peripheral) insufficiency     Family History  Problem Relation Age of Onset   COPD Mother    Heart disease Mother    Hypertension Mother    Varicose Veins Mother    Cirrhosis Father    Thyroid  disease Sister    Breast cancer Maternal Grandmother    Breast cancer Other        1st cousin    Pancreatic cancer Other        1st cousin    Lymphoma Cousin    Colon cancer Neg Hx    Colon polyps Neg Hx    Esophageal cancer Neg Hx    Rectal cancer Neg Hx    Stomach cancer Neg Hx    Past Surgical History:  Procedure Laterality Date   ABDOMINAL HYSTERECTOMY     ABDOMINAL  HYSTERECTOMY     ABLATION ON ENDOMETRIOSIS     x2   Bilat TKRs  04/2010   by [name]   BUNIONECTOMY  2009   CATARACT EXTRACTION, BILATERAL     2003,2007   COLONOSCOPY  08-26-2004   with gessner tics and hems    dequervains tensenovitis Left    JOINT REPLACEMENT Bilateral    knees   NASAL SINUS SURGERY     SHOULDER SURGERY  2005   TOTAL SHOULDER ARTHROPLASTY     Social History   Social History Narrative   Not on file   Immunization History  Administered Date(s) Administered   Fluad Quad(high Dose 65+) 05/16/2022   H1N1 05/18/2008   Influenza Inj Mdck Quad Pf 04/01/2017   Influenza Inj Mdck Quad With Preservative 04/13/2018   Influenza Split 03/23/2011, 03/14/2012, 03/23/2013, 03/22/2016   Influenza Whole 03/08/2008, 03/08/2009, 03/07/2010   Influenza,inj,Quad PF,6+ Mos 03/21/2014, 03/31/2019, 04/03/2021   PFIZER(Purple Top)SARS-COV-2 Vaccination 08/22/2019, 09/12/2019   PNEUMOCOCCAL CONJUGATE-20 11/20/2022   Pneumococcal Conjugate-13 06/28/2019   Td 01/06/2002   Tdap 03/14/2012  Zoster Recombinant(Shingrix) 03/08/2018     Objective: Vital Signs: BP 137/85 (BP Location: Left Arm, Patient Position: Sitting, Cuff Size: Normal)   Pulse 77   Resp 16   Ht 5' 8 (1.727 m)   Wt 245 lb (111.1 kg)   BMI 37.25 kg/m    Physical Exam Vitals and nursing note reviewed.  Constitutional:      Appearance: She is well-developed.  HENT:     Head: Normocephalic and atraumatic.  Eyes:     Conjunctiva/sclera: Conjunctivae normal.  Cardiovascular:     Rate and Rhythm: Normal rate and regular rhythm.     Heart sounds: Normal heart sounds.  Pulmonary:     Effort: Pulmonary effort is normal.     Breath sounds: Normal breath sounds.  Abdominal:     General: Bowel sounds are normal.     Palpations: Abdomen is soft.  Musculoskeletal:     Cervical back: Normal range of motion.  Lymphadenopathy:     Cervical: No cervical adenopathy.  Skin:    General: Skin is warm and dry.      Capillary Refill: Capillary refill takes less than 2 seconds.  Neurological:     Mental Status: She is alert and oriented to person, place, and time.  Psychiatric:        Behavior: Behavior normal.      Musculoskeletal Exam: C-spine has limited range of motion with lateral rotation.  Thoracic opposes noted.  No SI joint tenderness upon palpation.  Some discomfort with range of motion of the right shoulder.  Left shoulder has full range of motion with no discomfort.  Elbow joints, wrist joints, MCPs, PIPs, DIPs have good range of motion with no synovitis.  Hip joints have good range of motion with no groin pain.  Both knee replacements have good range of motion no warmth or effusion.  Ankle joints have good range of motion with no tenderness or joint swelling.  CDAI Exam: CDAI Score: -- Patient Global: --; Provider Global: -- Swollen: --; Tender: -- Joint Exam 12/09/2023   No joint exam has been documented for this visit   There is currently no information documented on the homunculus. Go to the Rheumatology activity and complete the homunculus joint exam.  Investigation: No additional findings.  Imaging: No results found.  Recent Labs: Lab Results  Component Value Date   WBC 3.8 08/17/2023   HGB 11.8 08/17/2023   PLT 153 08/17/2023   NA 141 08/17/2023   K 4.3 08/17/2023   CL 108 08/17/2023   CO2 27 08/17/2023   GLUCOSE 90 08/17/2023   BUN 13 08/17/2023   CREATININE 0.96 08/17/2023   BILITOT 0.5 08/17/2023   ALKPHOS 87 01/25/2017   AST 19 08/17/2023   ALT 17 08/17/2023   PROT 7.0 08/17/2023   ALBUMIN 4.2 10/13/2022   CALCIUM 8.8 08/17/2023   GFRAA 71 11/29/2020   QFTBGOLDPLUS NEGATIVE 04/30/2023    Speciality Comments: Prior therapy: Methotrexate  (elevated creatinine),Arava -pt dcd  Procedures:  No procedures performed Allergies: Nucynta [tapentadol], Codeine, Penicillins, Tessalon  [benzonatate ], and Other   Assessment / Plan:     Visit Diagnoses: Psoriatic  arthropathy (HCC): No synovitis or dactylitis noted on examination today.  No evidence of Achilles tendinitis or plantar fasciitis.  No SI joint tenderness upon palpation.  No active psoriasis at this time.  She has clinically been doing well on Enbrel  50 mg subcutaneous injections once weekly.  She is tolerating Enbrel  without any side effects or injection site reactions.  Overall  her symptoms remain stable on the current treatment regimen. No medication changes will be made at this time.  She was advised to notify us  if she develops signs or symptoms of a flare.  She will follow-up in the office in 5 months or sooner if needed.  Psoriasis: No active psoriasis at this time.   High risk medication use - Enbrel  50 mg subcutaneous injections once weekly.  Renal panel updated on 10/18/23.  Orders for Hepatic function panel and CBC are placed today--she plans on having updated lab work next week. TB gold negative on 04/30/23.  Currently recovering from an upper respiratory tract infection.  Discussed the importance of holding enbrel  if she develops signs or symptoms of an infection and to resume once the infection has completely cleared.  - Plan: CBC with Differential/Platelet, Hepatic function panel  Chronic right shoulder pain -Previously underwent arthroscopic rotator cuff repair in 2005.  Patient has had a recurrence of discomfort in the right shoulder.  No recent injury or fall.  Some discomfort with range of motion noted.  She has some tenderness anteriorly.  X-rays of the right shoulder were updated today for further evaluation.  Offered to place a referral to physical therapy.  She plans on trying home exercises.  She is given a handout of exercises to perform.  She declined a cortisone injection at this time.  If her symptoms persist or worsen she will likely follow back up with orthopedics for further evaluation and management.  Plan: XR Shoulder Right  Primary osteoarthritis of both hands:  Complete fist formation bilaterally.  No synovitis or dactylitis noted.  Trochanteric bursitis, left hip: Not currently symptomatic.   Status post bilateral knee replacements: Doing well.  Good ROM with no warmth or effusion.    Sacroiliitis (HCC): Intermittent left SI joint pain.  No SI joint tenderness upon palpation.  Raynaud's disease without gangrene: Not currently symptomatic.  Osteopenia of multiple sites - DEXA updated on 08/01/2021: Right femoral neck BMD 0.598 with T score of -2.3.She is taking vitamin D  1000 units daily.  Followed by Dr. Perri. Due to update DEXA  Other medical conditions are listed as follows:   History of hypertension: Blood pressure was 137/85 today in the office.  History of gastroesophageal reflux (GERD)  History of ulcer of lower limb/ Venous stasis ulcer 01/2015   History of hypothyroidism  History of asthma  History of migraine  History of IBS  Primary insomnia  Hereditary and idiopathic peripheral neuropathy    Orders: Orders Placed This Encounter  Procedures   XR Shoulder Right   CBC with Differential/Platelet   Hepatic function panel   No orders of the defined types were placed in this encounter.    Follow-Up Instructions: Return in about 5 months (around 05/10/2024) for Psoriatic arthritis, Osteoarthritis.   Waddell CHRISTELLA Craze, PA-C  I reviewed shoulder joint x-rays.  I called patient and discussed results.    The x-rays were suggestive of severe glenohumeral joint arthritis.  Patient also had postsurgical changes in the acromion and clavicle.  She had been under care of Dr. Mariea in the past.  I advised her to schedule a follow-up appointment with Dr. Mariea.  Maya Nash, MD   Note - This record has been created using Animal nutritionist.  Chart creation errors have been sought, but may not always  have been located. Such creation errors do not reflect on  the standard of medical care.

## 2023-11-29 ENCOUNTER — Ambulatory Visit: Payer: BC Managed Care – PPO | Admitting: Cardiology

## 2023-12-01 ENCOUNTER — Other Ambulatory Visit (HOSPITAL_COMMUNITY): Payer: Self-pay

## 2023-12-01 ENCOUNTER — Other Ambulatory Visit: Payer: Self-pay

## 2023-12-01 MED ORDER — SYNTHROID 112 MCG PO TABS
112.0000 ug | ORAL_TABLET | Freq: Every morning | ORAL | 11 refills | Status: AC
  Filled 2023-12-01: qty 30, 30d supply, fill #0
  Filled 2023-12-31: qty 30, 30d supply, fill #1
  Filled 2024-01-31: qty 30, 30d supply, fill #2
  Filled 2024-02-23: qty 30, 30d supply, fill #3
  Filled 2024-03-24: qty 30, 30d supply, fill #4
  Filled 2024-04-23: qty 30, 30d supply, fill #5
  Filled 2024-05-19: qty 30, 30d supply, fill #6
  Filled 2024-06-18: qty 30, 30d supply, fill #7

## 2023-12-02 ENCOUNTER — Ambulatory Visit: Admitting: Internal Medicine

## 2023-12-09 ENCOUNTER — Encounter: Payer: Self-pay | Admitting: Physician Assistant

## 2023-12-09 ENCOUNTER — Ambulatory Visit

## 2023-12-09 ENCOUNTER — Ambulatory Visit: Attending: Physician Assistant | Admitting: Physician Assistant

## 2023-12-09 VITALS — BP 137/85 | HR 77 | Resp 16 | Ht 68.0 in | Wt 245.0 lb

## 2023-12-09 DIAGNOSIS — I7300 Raynaud's syndrome without gangrene: Secondary | ICD-10-CM

## 2023-12-09 DIAGNOSIS — M25511 Pain in right shoulder: Secondary | ICD-10-CM

## 2023-12-09 DIAGNOSIS — Z8669 Personal history of other diseases of the nervous system and sense organs: Secondary | ICD-10-CM

## 2023-12-09 DIAGNOSIS — F5101 Primary insomnia: Secondary | ICD-10-CM

## 2023-12-09 DIAGNOSIS — Z96653 Presence of artificial knee joint, bilateral: Secondary | ICD-10-CM

## 2023-12-09 DIAGNOSIS — Z8709 Personal history of other diseases of the respiratory system: Secondary | ICD-10-CM

## 2023-12-09 DIAGNOSIS — G8929 Other chronic pain: Secondary | ICD-10-CM | POA: Diagnosis not present

## 2023-12-09 DIAGNOSIS — M19042 Primary osteoarthritis, left hand: Secondary | ICD-10-CM

## 2023-12-09 DIAGNOSIS — M7062 Trochanteric bursitis, left hip: Secondary | ICD-10-CM

## 2023-12-09 DIAGNOSIS — Z8639 Personal history of other endocrine, nutritional and metabolic disease: Secondary | ICD-10-CM

## 2023-12-09 DIAGNOSIS — M19041 Primary osteoarthritis, right hand: Secondary | ICD-10-CM

## 2023-12-09 DIAGNOSIS — L4050 Arthropathic psoriasis, unspecified: Secondary | ICD-10-CM | POA: Diagnosis not present

## 2023-12-09 DIAGNOSIS — M461 Sacroiliitis, not elsewhere classified: Secondary | ICD-10-CM

## 2023-12-09 DIAGNOSIS — Z79899 Other long term (current) drug therapy: Secondary | ICD-10-CM

## 2023-12-09 DIAGNOSIS — Z872 Personal history of diseases of the skin and subcutaneous tissue: Secondary | ICD-10-CM

## 2023-12-09 DIAGNOSIS — L409 Psoriasis, unspecified: Secondary | ICD-10-CM | POA: Diagnosis not present

## 2023-12-09 DIAGNOSIS — Z8719 Personal history of other diseases of the digestive system: Secondary | ICD-10-CM

## 2023-12-09 DIAGNOSIS — Z8679 Personal history of other diseases of the circulatory system: Secondary | ICD-10-CM

## 2023-12-09 DIAGNOSIS — M8589 Other specified disorders of bone density and structure, multiple sites: Secondary | ICD-10-CM

## 2023-12-09 DIAGNOSIS — G609 Hereditary and idiopathic neuropathy, unspecified: Secondary | ICD-10-CM

## 2023-12-09 NOTE — Patient Instructions (Signed)

## 2023-12-12 ENCOUNTER — Ambulatory Visit: Payer: Self-pay | Admitting: Physician Assistant

## 2023-12-12 NOTE — Progress Notes (Signed)
 X-rays of the right shoulder are consistent with glenohumeral joint arthritis.

## 2023-12-15 ENCOUNTER — Other Ambulatory Visit: Payer: Self-pay | Admitting: *Deleted

## 2023-12-15 DIAGNOSIS — Z79899 Other long term (current) drug therapy: Secondary | ICD-10-CM | POA: Diagnosis not present

## 2023-12-15 LAB — CBC WITH DIFFERENTIAL/PLATELET
Absolute Lymphocytes: 2507 {cells}/uL (ref 850–3900)
Absolute Monocytes: 684 {cells}/uL (ref 200–950)
Basophils Absolute: 32 {cells}/uL (ref 0–200)
Basophils Relative: 0.6 %
Eosinophils Absolute: 69 {cells}/uL (ref 15–500)
Eosinophils Relative: 1.3 %
HCT: 41.6 % (ref 35.0–45.0)
Hemoglobin: 13.2 g/dL (ref 11.7–15.5)
MCH: 27 pg (ref 27.0–33.0)
MCHC: 31.7 g/dL — ABNORMAL LOW (ref 32.0–36.0)
MCV: 85.1 fL (ref 80.0–100.0)
MPV: 11 fL (ref 7.5–12.5)
Monocytes Relative: 12.9 %
Neutro Abs: 2009 {cells}/uL (ref 1500–7800)
Neutrophils Relative %: 37.9 %
Platelets: 152 Thousand/uL (ref 140–400)
RBC: 4.89 Million/uL (ref 3.80–5.10)
RDW: 14.1 % (ref 11.0–15.0)
Total Lymphocyte: 47.3 %
WBC: 5.3 Thousand/uL (ref 3.8–10.8)

## 2023-12-15 LAB — HEPATIC FUNCTION PANEL
AG Ratio: 1.5 (calc) (ref 1.0–2.5)
ALT: 16 U/L (ref 6–29)
AST: 19 U/L (ref 10–35)
Albumin: 4.4 g/dL (ref 3.6–5.1)
Alkaline phosphatase (APISO): 80 U/L (ref 37–153)
Bilirubin, Direct: 0.1 mg/dL (ref 0.0–0.2)
Globulin: 3 g/dL (ref 1.9–3.7)
Indirect Bilirubin: 0.6 mg/dL (ref 0.2–1.2)
Total Bilirubin: 0.7 mg/dL (ref 0.2–1.2)
Total Protein: 7.4 g/dL (ref 6.1–8.1)

## 2023-12-16 NOTE — Progress Notes (Signed)
 CBC WNL. Hepatic function panel WNL

## 2023-12-17 ENCOUNTER — Other Ambulatory Visit (HOSPITAL_COMMUNITY): Payer: Self-pay

## 2023-12-17 ENCOUNTER — Ambulatory Visit: Admitting: Internal Medicine

## 2023-12-17 VITALS — BP 130/80 | HR 98 | Ht 68.0 in | Wt 242.0 lb

## 2023-12-17 DIAGNOSIS — L4050 Arthropathic psoriasis, unspecified: Secondary | ICD-10-CM

## 2023-12-17 DIAGNOSIS — I10 Essential (primary) hypertension: Secondary | ICD-10-CM

## 2023-12-17 DIAGNOSIS — N1832 Chronic kidney disease, stage 3b: Secondary | ICD-10-CM

## 2023-12-17 DIAGNOSIS — J22 Unspecified acute lower respiratory infection: Secondary | ICD-10-CM

## 2023-12-17 DIAGNOSIS — Z8669 Personal history of other diseases of the nervous system and sense organs: Secondary | ICD-10-CM

## 2023-12-17 DIAGNOSIS — Z0000 Encounter for general adult medical examination without abnormal findings: Secondary | ICD-10-CM | POA: Diagnosis not present

## 2023-12-17 DIAGNOSIS — Z96653 Presence of artificial knee joint, bilateral: Secondary | ICD-10-CM | POA: Diagnosis not present

## 2023-12-17 DIAGNOSIS — E063 Autoimmune thyroiditis: Secondary | ICD-10-CM

## 2023-12-17 DIAGNOSIS — Z6836 Body mass index (BMI) 36.0-36.9, adult: Secondary | ICD-10-CM

## 2023-12-17 MED ORDER — AZITHROMYCIN 250 MG PO TABS
ORAL_TABLET | ORAL | 0 refills | Status: AC
  Filled 2023-12-17 (×2): qty 6, 5d supply, fill #0

## 2023-12-17 NOTE — Patient Instructions (Addendum)
 Have prescribed Z-pak for acute lower respiratory infection with cough. Labs reviewed and are stable. RTC in one year or as needed. It was a pleasure to see you today.

## 2023-12-17 NOTE — Progress Notes (Signed)
 Annual Wellness Visit   Patient Care Team: Daiveon Markman, Ronal PARAS, MD as PCP - General (Internal Medicine) Michele Richardson, DO as PCP - Cardiology (Cardiology) Britto, Errol, MD (Inactive) as Consulting Physician (Surgery)  Visit Date: 12/17/23   Chief Complaint  Patient presents with   Cough    Coughing up green for 3 weeks.    Subjective:  Patient: Sarah Horton, Female DOB: Jan 09, 1951, 73 y.o. MRN: [id number] Vitals:   12/17/23 1125  BP: 130/80    Sarah Horton is a 73 y.o. Female who presents today for her Annual Wellness Visit. Patient has Hypothyroidism; Obesity; ANEMIA; Anxiety state; Hereditary and idiopathic peripheral neuropathy; Venous (peripheral) insufficiency; SINUSITIS, ACUTE; Allergic rhinitis; BRONCHITIS, RECURRENT; Asthma; GERD; IRRITABLE BOWEL SYNDROME; PSORIATIC ARTHROPATHY; Osteoarthritis; History of migraine; INSOMNIA; ADD (attention deficit disorder); Venous stasis ulcer of ankle (HCC); Status post bilateral knee replacements; Varicose veins of lower extremities with ulcer (HCC); Varicose veins of bilateral lower extremities with other complications; Varicose veins of left lower extremity with complications; Elevated BP; Varicose veins of right lower extremity with complications; Pre-ulcerative calluses; Low back pain; Subacromial bursitis of right shoulder joint; Flank pain, acute; Chest wall pain; Psoriasis; Raynaud's disease without gangrene; High risk medication use; History of ulcer of lower limb/ Venous stasis ulcer 01/2015 ; Osteopenia of multiple sites; History of peripheral neuropathy; Left wrist tendonitis/ history of surgery for Dequervains ; CAD (coronary artery disease); and Aortic stenosis on their problem list.  C/o productive cough w/ green sputum x3 weeks.  History of Hypertension treated with  Metoprolol  succinate 25 mg daily. Blood Pressure: normotensive today at 130/80.   History of Hypothyroidism treated with Levothyroxine  112 mcg  daily. Recent PTH: 26.  History of Chronic Kidney Disease, stage IIIb followed by [facility].   History of ADD treated with Adderall-XR 20 mg twice daily.  History of Psoriatic Arthritis treated with Enbrel  50 mg injected weekly. Followed by Dr. Dolphus.  History of Migraine Headaches treated with Sumatriptan  10 mg at bedtime.  History of Musculoskeletal Pain treated with Tramadol  50 mg as needed  History of Allergic Rhinitis treated with Albuterol  Inhaler, Cetirizine 10 mg daily, and Montelukast  10 mg at bedtime.   Labs done through [facility], reviewed, otherwise labs 12/15/2023 CBC, compared to 11/2022: MCHC 31.7, decreased from 33.2; otherwise WNL.    S/p Abdominal Hysterectomy.  Mammogram 09/16/2023 noted benign-appearing calcifications bilaterally with repeat recommendation of 2026.  Colonoscopy 2017 with Dr. Avram noting mild diverticulosis in left colon; otherwise normal with repeat recommendation of 2027.  Overdue for Bone Density, last completed 07/2021 T-score -2.3, .   Vaccine Counseling: Due for Shingles 2/2.  Past Medical History:  Diagnosis Date   Allergic rhinitis, cause unspecified    Allergy    Anemia, unspecified    Anxiety state, unspecified    Aortic stenosis 09/01/2023   TTE 06/25/2023: EF 60-65, no RWMA, GLS -24, normal RVSF, normal PASP, trivial MR, mild-moderate AS (mean gradient 12 mmHg, V-max 214 cm/s DI 0.48)    Arthritis    Asthma 2013   diagnosed by NP   CAD (coronary artery disease) 09/01/2023   Mild nonobstructive-CCTA 06/16/2023: CAC score 0; mild total plaque volume 41 mm (23rd percentile-calcified 0 mm, noncalcified 41 mm; LCx proximal 30-49 (soft plaque), OM1/artifact    Cataract    bilaterally removed 2003,2007   Chronic kidney disease 2022   COPD (chronic obstructive pulmonary disease) (HCC) since 1994   Repetitive bronchitis   Esophageal reflux  Heart murmur    confirmed by Dr. Michele 2024   Hypertension 2023   Dr.  Dolan prescribed metopropolol   Irritable bowel syndrome    Migraine    Obesity, unspecified    Psoriatic arthropathy (HCC)    Ulcer 2017 - Venous Ulcers   Laser ablation to correct   Unspecified chronic bronchitis (HCC)    Unspecified hereditary and idiopathic peripheral neuropathy    Unspecified hypothyroidism    Unspecified venous (peripheral) insufficiency    Medical/Surgical History Narrative copied from previous exam:  Allergic/Intolerant to:  Allergies  Allergen Reactions   Nucynta [Tapentadol]     Hives   Codeine     REACTION: severe nausea by itself   Penicillins     REACTION: hives all over   Tessalon  [Benzonatate ]     rash   Other Rash    FOOD DYES   Had bilateral knee replacement 2011.  History of recurrent urinary infections.  Had partial hysterectomy 1999.  History of endometriosis 1995.  Stress fracture of left foot in the remote past.  History of rotator cuff tear and torn biceps muscle on the right.  Urethral dilatation and D&C 1979  Endometrial cauterization 1985  Hammertoe and bunion surgery  Sinus surgery 2002  Cataract extraction in 2003 on the left and in 2007 on the right.  Venous ablations 2017  Past Surgical History:  Procedure Laterality Date   ABDOMINAL HYSTERECTOMY     ABDOMINAL HYSTERECTOMY     ABLATION ON ENDOMETRIOSIS     x2   Bilat TKRs  04/2010   by [name]   BUNIONECTOMY  2009   CATARACT EXTRACTION, BILATERAL     2003,2007   COLONOSCOPY  08/26/2004   with gessner tics and hems    dequervains tensenovitis Left    EYE SURGERY  2003 and 2007   Cataract - both eyes   FRACTURE SURGERY  2000   Not a fracture but no other place to record:  Sinus restructure   JOINT REPLACEMENT Bilateral    knees   NASAL SINUS SURGERY     SHOULDER SURGERY  2005   TOTAL SHOULDER ARTHROPLASTY     TUBAL LIGATION  1985   during Endometrial repairs   Family History  Problem Relation Age of Onset   COPD Mother    Heart disease  Mother    Hypertension Mother    Varicose Veins Mother    Asthma Mother    Cirrhosis Father    Thyroid  disease Sister    Breast cancer Maternal Grandmother    Cancer Maternal Grandmother    Stroke Maternal Grandmother    Breast cancer Other        1st cousin    Pancreatic cancer Other        1st cousin    Lymphoma Cousin    Heart disease Paternal Grandmother    Arthritis Maternal Aunt    Cancer Maternal Uncle    Colon cancer Neg Hx    Colon polyps Neg Hx    Esophageal cancer Neg Hx    Rectal cancer Neg Hx    Stomach cancer Neg Hx    Social History   Social History Narrative   Not on file  Social history: She is married.  Husband is also a patient here.  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  Husband has multiple medical issues.  They reside in [place].  Review of Systems  Constitutional:  Positive for chills,  fever and malaise/fatigue. Negative for weight loss.  HENT:  Positive for congestion. Negative for ear pain, hearing loss, sinus pain and sore throat.   Respiratory:  Positive for cough and sputum production. Negative for hemoptysis and shortness of breath.   Cardiovascular:  Negative for chest pain, palpitations, leg swelling and PND.  Gastrointestinal:  Positive for diarrhea, nausea and vomiting. Negative for abdominal pain, constipation and heartburn.  Genitourinary:  Negative for dysuria, frequency and urgency.  Musculoskeletal:  Negative for back pain, myalgias and neck pain.  Skin:  Negative for itching and rash.  Neurological:  Positive for headaches. Negative for dizziness, tingling and seizures.  Endo/Heme/Allergies:  Negative for polydipsia.  Psychiatric/Behavioral:  Negative for depression. The patient is not nervous/anxious.     Objective:  Vitals: BP 130/80   Pulse 98   Ht 5' 8 (1.727 m)   Wt 242 lb (109.8 kg)   SpO2 97%   BMI 36.80 kg/m  Physical Exam Vitals and nursing note reviewed.   Constitutional:      General: She is not in acute distress.    Appearance: Normal appearance. She is not ill-appearing or toxic-appearing.  HENT:     Head: Normocephalic and atraumatic.     Right Ear: Hearing, ear canal and external ear normal.     Left Ear: Hearing, tympanic membrane, ear canal and external ear normal.     Mouth/Throat:     Mouth: Mucous membranes are moist.     Pharynx: Oropharynx is clear. No oropharyngeal exudate or posterior oropharyngeal erythema.  Eyes:     Extraocular Movements: Extraocular movements intact.     Pupils: Pupils are equal, round, and reactive to light.  Neck:     Thyroid : No thyroid  mass, thyromegaly or thyroid  tenderness.     Vascular: No carotid bruit.  Cardiovascular:     Rate and Rhythm: Normal rate and regular rhythm. No extrasystoles are present.    Pulses:          Dorsalis pedis pulses are 2+ on the right side and 2+ on the left side.     Heart sounds: Normal heart sounds. No murmur heard.    No friction rub. No gallop.  Pulmonary:     Effort: Pulmonary effort is normal.     Breath sounds: Normal breath sounds. No decreased breath sounds, wheezing, rhonchi or rales.  Chest:     Chest wall: No mass.  Abdominal:     Palpations: Abdomen is soft. There is no hepatomegaly, splenomegaly or mass.     Tenderness: There is no abdominal tenderness.     Hernia: No hernia is present.  Musculoskeletal:     Cervical back: Normal range of motion.     Right lower leg: No edema.     Left lower leg: No edema.  Lymphadenopathy:     Cervical: No cervical adenopathy.     Upper Body:     Right upper body: No supraclavicular adenopathy.     Left upper body: No supraclavicular adenopathy.  Skin:    General: Skin is warm and dry.  Neurological:     General: No focal deficit present.     Mental Status: She is alert and oriented to person, place, and time. Mental status is at baseline.     Sensory: Sensation is intact.     Motor: Motor function is  intact. No weakness.     Deep Tendon Reflexes: Reflexes are normal and symmetric.  Psychiatric:  Attention and Perception: Attention normal.        Mood and Affect: Mood normal.        Speech: Speech normal.        Behavior: Behavior normal.        Thought Content: Thought content normal.        Cognition and Memory: Cognition normal.        Judgment: Judgment normal.   Most Recent Fall Risk Assessment:    10/02/2022    2:15 PM  Fall Risk   Falls in the past year? 0  Number falls in past yr: 0  Injury with Fall? 0  Risk for fall due to : No Fall Risks  Follow up Falls prevention discussed   Most Recent Depression Screenings:    12/17/2023   11:32 AM 10/02/2022    2:15 PM  PHQ 2/9 Scores  PHQ - 2 Score 0 0   Results:  Studies Obtained And Personally Reviewed By Me:  Mammogram 09/16/2023 noted benign-appearing calcifications bilaterally with repeat recommendation of 2026.  Colonoscopy 2017 with Dr. Avram noting mild diverticulosis in left colon; otherwise normal with repeat recommendation of 2027.  Overdue for Bone Density, last completed 07/2021 T-score -2.3.  Labs:     Component Value Date/Time   NA 142 10/19/2023 1335   K 4.5 10/19/2023 1640   CL 106 10/19/2023 1335   CO2 23 (A) 10/19/2023 1335   GLUCOSE 90 08/17/2023 1521   GLUCOSE 90 04/16/2006 1125   BUN 20 10/19/2023 1335   CREATININE 1.2 (A) 10/19/2023 1335   CREATININE 0.96 08/17/2023 1521   CALCIUM 9.6 10/19/2023 1335   PROT 7.4 12/15/2023 1546   PROT 6.7 09/08/2016 1648   ALBUMIN 4.2 10/19/2023 1335   ALBUMIN 4.2 09/08/2016 1648   AST 19 12/15/2023 1546   ALT 16 12/15/2023 1546   ALKPHOS 87 01/25/2017 1617   BILITOT 0.7 12/15/2023 1546   BILITOT 0.3 09/08/2016 1648   GFRNONAA 61 11/29/2020 1155   GFRAA 71 11/29/2020 1155    Lab Results  Component Value Date   WBC 5.3 12/15/2023   HGB 13.2 12/15/2023   HCT 41.6 12/15/2023   MCV 85.1 12/15/2023   PLT 152 12/15/2023   Lab Results   Component Value Date   CHOL 170 11/17/2022   HDL 57 11/17/2022   LDLCALC 91 11/17/2022   TRIG 128 11/17/2022   CHOLHDL 3.0 11/17/2022   Lab Results  Component Value Date   HGBA1C 5.0 04/01/2021    Lab Results  Component Value Date   TSH 1.83 11/17/2022    Assessment & Plan:   Meds ordered this encounter  Medications   azithromycin  (ZITHROMAX ) 250 MG tablet    Sig: Take 2 tablets (500 mg total) by mouth daily for 1 day, THEN 1 tablet (250 mg total) daily for 4 days.    Dispense:  6 tablet    Refill:  0   Other Labs Reviewed today: Labs done through [facility] reviewed, otherwise labs 12/15/2023 CBC, compared to 11/2022: MCHC 31.7, decreased from 33.2; otherwise WNL.    Acute Lower Respiratory Infection: sending in 250 mg Azithromycin  - take 2 tablets on Day 1 and 1 tablet on Days 2-5  Hypertension treated with  Metoprolol  succinate 25 mg daily. Blood Pressure: normotensive today at 130/80.   Hypothyroidism treated with Levothyroxine  112 mcg daily. Recent PTH: 26.  Chronic Kidney Disease, stage IIIb followed by [facility].   ADD treated with Adderall-XR 20 mg twice daily.  Psoriatic Arthritis treated with Enbrel  50 mg injected weekly. Followed by Dr. Dolphus.  Migraine Headaches treated with Sumatriptan  10 mg at bedtime.  Musculoskeletal Pain treated with Tramadol  50 mg as needed  Allergic Rhinitis treated with Albuterol  Inhaler, Cetirizine 10 mg daily, and Montelukast  10 mg at bedtime.   S/p Abdominal Hysterectomy.  Mammogram 09/16/2023 noted benign-appearing calcifications bilaterally with repeat recommendation of 2026.  Colonoscopy 2017 with Dr. Avram noting mild diverticulosis in left colon; otherwise normal with repeat recommendation of 2027.  Overdue for Bone Density, last completed 07/2021 T-score -2.3, .   Vaccine Counseling: Due for Shingles 2/2.   Annual wellness visit done today including the all of the following: Reviewed patient's Family  Medical History Reviewed and updated list of patient's medical providers Assessment of cognitive impairment was done Assessed patient's functional ability Established a written schedule for health screening services Health Risk Assessent Completed and Reviewed  Discussed health benefits of physical activity, and encouraged her to engage in regular exercise appropriate for her age and condition.    I,Emily Lagle,acting as a Neurosurgeon for Ronal JINNY Hailstone, MD.,have documented all relevant documentation on the behalf of Ronal JINNY Hailstone, MD,as directed by  Ronal JINNY Hailstone, MD while in the presence of Ronal JINNY Hailstone, MD.   I, Ronal JINNY Hailstone, MD, have reviewed all documentation for this visit. The documentation on 01/01/24 for the exam, diagnosis, procedures, and orders are all accurate and complete.

## 2023-12-27 ENCOUNTER — Other Ambulatory Visit (HOSPITAL_COMMUNITY): Payer: Self-pay

## 2023-12-28 DIAGNOSIS — E039 Hypothyroidism, unspecified: Secondary | ICD-10-CM | POA: Diagnosis not present

## 2023-12-28 DIAGNOSIS — E781 Pure hyperglyceridemia: Secondary | ICD-10-CM | POA: Diagnosis not present

## 2023-12-28 DIAGNOSIS — R7301 Impaired fasting glucose: Secondary | ICD-10-CM | POA: Diagnosis not present

## 2023-12-30 ENCOUNTER — Ambulatory Visit: Attending: Cardiology | Admitting: Cardiology

## 2023-12-30 ENCOUNTER — Encounter: Payer: Self-pay | Admitting: Cardiology

## 2023-12-30 VITALS — BP 122/80 | HR 78 | Resp 16 | Ht 68.0 in | Wt 246.4 lb

## 2023-12-30 DIAGNOSIS — E039 Hypothyroidism, unspecified: Secondary | ICD-10-CM

## 2023-12-30 DIAGNOSIS — I2510 Atherosclerotic heart disease of native coronary artery without angina pectoris: Secondary | ICD-10-CM

## 2023-12-30 DIAGNOSIS — E66812 Obesity, class 2: Secondary | ICD-10-CM

## 2023-12-30 DIAGNOSIS — E781 Pure hyperglyceridemia: Secondary | ICD-10-CM

## 2023-12-30 DIAGNOSIS — I10 Essential (primary) hypertension: Secondary | ICD-10-CM | POA: Diagnosis not present

## 2023-12-30 DIAGNOSIS — I350 Nonrheumatic aortic (valve) stenosis: Secondary | ICD-10-CM

## 2023-12-30 DIAGNOSIS — Z6837 Body mass index (BMI) 37.0-37.9, adult: Secondary | ICD-10-CM

## 2023-12-30 DIAGNOSIS — E6609 Other obesity due to excess calories: Secondary | ICD-10-CM

## 2023-12-30 NOTE — Patient Instructions (Signed)
 Medication Instructions:  No medication changes were made at this visit. Continue current regimen.   *If you need a refill on your cardiac medications before your next appointment, please call your pharmacy*  Follow-Up: At [facility], you and your health needs are our priority.  As part of our continuing mission to provide you with exceptional heart care, our providers are all part of one team.  This team includes your primary Cardiologist (physician) and Advanced Practice Providers or APPs (Physician Assistants and Nurse Practitioners) who all work together to provide you with the care you need, when you need it.  Your next appointment:   1 year(s)  Provider:   Madonna Large, DO

## 2023-12-30 NOTE — Progress Notes (Signed)
 Cardiology Office Note:    NAME:  Sarah Horton    MRN: [id number] DOB:  Nov 07, 1950   PCP:  Perri Ronal PARAS, MD  Former Cardiology Providers: None Primary Cardiologist:  Madonna Large, DO, [facility] (established care 05/25/2023) Electrophysiologist:  None   Chief Complaint  Patient presents with   Shortness of Breath   Follow-up    History of Present Illness:    Sarah Horton is a 73 y.o.  female whose past medical history and cardiovascular risk factors includes: Psoriatic arthritis, hypothyroidism, hypertension, hypertriglyceridemia, venous ablation bilateral.   Patient was referred to practice back in December 2024 for evaluation of shortness of breath and questionable arrhythmia.  Given her precordial pain and shortness of breath she was recommended to undergo echo and coronary CTA.  Her coronary calcium score was 0, mild total plaque volume, and mild nonobstructive coronary disease.  Her echocardiogram also noted preserved LVEF, normal diastolic function, and mild to moderate aortic stenosis.  At the last office visit she was seen by Glendia Ferrier 09/01/2023 recommended stricter lipid management.  The patient preferred conservative management with repeat blood work in 3 months.  She had fasting lipids on 12/18/2022 which notes a total cholesterol 177 mg/dL and LDL of 93 mg/dL and triglycerides of 818 mg/dL.  In addition, the shared decision between [facility] and the patient was to hold off on cardiac monitor.  Since last office visit patient states that her palpitations have resolved.  She is no longer on Adderall or clonidine .  She is also started taking inhalers which has significantly helped her shortness of breath.  Current Medications: Current Meds  Medication Sig   albuterol  (VENTOLIN  HFA) 108 (90 Base) MCG/ACT inhaler Inhale 2 puffs into the lungs every 6 (six) hours as needed for wheezing or shortness of breath.   Azelastine  HCl (ASTEPRO  ALLERGY) 0.15 % SOLN     cetirizine (ZYRTEC) 10 MG tablet Take 10 mg by mouth daily.   Cholecalciferol (VITAMIN D3) 1000 units CAPS Take by mouth.   ENBREL  MINI 50 MG/ML injection INJECT 50 MG UNDER THE SKIN ONCE A WEEK   MAGNESIUM PO Take 500 mg by mouth daily.   methocarbamol  (ROBAXIN ) 500 MG tablet Take 1 tablet (500 mg total) by mouth 2 (two) times daily as needed.   metoprolol  succinate (TOPROL -XL) 25 MG 24 hr tablet Take 1 tablet (25 mg total) by mouth daily.   metroNIDAZOLE  (METROGEL ) 0.75 % gel Apply 1 Application topically 2 (two) times daily.   montelukast  (SINGULAIR ) 10 MG tablet Take 1 tablet (10 mg total) by mouth at bedtime.   Multiple Vitamin (MULTIVITAMIN) tablet Take 1 tablet by mouth daily.   Omega-3 Fatty Acids (FISH OIL PO) Take by mouth daily.   promethazine  (PHENERGAN ) 25 MG tablet Take 1 tablet (25 mg total) by mouth every 6 (six) hours as needed for nausea or vomiting.   SALINE MIST SPRAY NA Place into the nose.   SUMAtriptan  (IMITREX ) 20 MG/ACT nasal spray Place 1 spray (20 mg total) into the nose every 2 (two) hours as needed.   SYNTHROID  112 MCG tablet Take 1 tablet (112 mcg total) by mouth in the morning on an empty stomach.   traMADol  (ULTRAM ) 50 MG tablet Take 1 tablet (50 mg total) by mouth 2 (two) times daily as needed.     Allergies:    Nucynta [tapentadol], Codeine, Penicillins, Tessalon  [benzonatate ], and Other   Past Medical History: Past Medical History:  Diagnosis Date   Allergic  rhinitis, cause unspecified    Allergy    Anemia, unspecified    Anxiety state, unspecified    Aortic stenosis 09/01/2023   TTE 06/25/2023: EF 60-65, no RWMA, GLS -24, normal RVSF, normal PASP, trivial MR, mild-moderate AS (mean gradient 12 mmHg, V-max 214 cm/s DI 0.48)    Arthritis    Asthma 2013   diagnosed by NP   CAD (coronary artery disease) 09/01/2023   Mild nonobstructive-CCTA 06/16/2023: CAC score 0; mild total plaque volume 41 mm (23rd percentile-calcified 0 mm, noncalcified 41 mm;  LCx proximal 30-49 (soft plaque), OM1/artifact    Cataract    bilaterally removed 2003,2007   Chronic kidney disease 2022   COPD (chronic obstructive pulmonary disease) (HCC) since 1994   Repetitive bronchitis   Esophageal reflux    Heart murmur    confirmed by Dr. Michele 2024   Hypertension 2023   Dr. Dolan prescribed metopropolol   Irritable bowel syndrome    Migraine    Obesity, unspecified    Psoriatic arthropathy (HCC)    Ulcer 2017 - Venous Ulcers   Laser ablation to correct   Unspecified chronic bronchitis (HCC)    Unspecified hereditary and idiopathic peripheral neuropathy    Unspecified hypothyroidism    Unspecified venous (peripheral) insufficiency     Past Surgical History: Past Surgical History:  Procedure Laterality Date   ABDOMINAL HYSTERECTOMY     ABDOMINAL HYSTERECTOMY     ABLATION ON ENDOMETRIOSIS     x2   Bilat TKRs  04/2010   by [name]   BUNIONECTOMY  2009   CATARACT EXTRACTION, BILATERAL     2003,2007   COLONOSCOPY  08/26/2004   with gessner tics and hems    dequervains tensenovitis Left    EYE SURGERY  2003 and 2007   Cataract - both eyes   FRACTURE SURGERY  2000   Not a fracture but no other place to record:  Sinus restructure   JOINT REPLACEMENT Bilateral    knees   NASAL SINUS SURGERY     SHOULDER SURGERY  2005   TOTAL SHOULDER ARTHROPLASTY     TUBAL LIGATION  1985   during Endometrial repairs    Social History: Social History   Tobacco Use   Smoking status: Former    Current packs/day: 0.00    Average packs/day: 1.5 packs/day for 22.0 years (33.0 ttl pk-yrs)    Types: Cigarettes    Start date: 06/08/1970    Quit date: 06/08/1992    Years since quitting: 31.5    Passive exposure: Never   Smokeless tobacco: Never  Vaping Use   Vaping status: Never Used  Substance Use Topics   Alcohol use: Yes    Comment: a glass of wine approximately 2 or 3 X year   Drug use: Never    Family History: Family History  Problem Relation Age  of Onset   COPD Mother    Heart disease Mother    Hypertension Mother    Varicose Veins Mother    Asthma Mother    Cirrhosis Father    Thyroid  disease Sister    Breast cancer Maternal Grandmother    Cancer Maternal Grandmother    Stroke Maternal Grandmother    Breast cancer Other        1st cousin    Pancreatic cancer Other        1st cousin    Lymphoma Cousin    Heart disease Paternal Grandmother    Arthritis Maternal Aunt  Cancer Maternal Uncle    Colon cancer Neg Hx    Colon polyps Neg Hx    Esophageal cancer Neg Hx    Rectal cancer Neg Hx    Stomach cancer Neg Hx     ROS:   Review of Systems  Constitutional: Positive for weight gain (11 pounds since 05/2023).  Cardiovascular:  Negative for chest pain, claudication, irregular heartbeat, leg swelling, near-syncope, orthopnea, palpitations, paroxysmal nocturnal dyspnea and syncope.  Respiratory:  Positive for shortness of breath.   Hematologic/Lymphatic: Negative for bleeding problem.    EKGs/Labs/Other Studies Reviewed:   EKG: EKG Interpretation Date/Time:  Thursday December 30 2023 13:45:03 EDT Ventricular Rate:  83 Text Interpretation: Normal sinus rhythm Left axis deviation consider Anterior infarct (cited on or before 30-Dec-2023) When compared with ECG of 25-May-2023 13:46, No significant change since last tracing Confirmed by Michele Richardson [phone]) on 12/30/2023 2:04:50 PM  Echocardiogram: June 25 2023 LVEF: 60 to 65% Diastolic Function: Normal Right ventricular size and function normal. Stenosis: Mild to moderate aortic stenosis Estimated RAP 3 mmHG See report for additional details   Coronary CTA January 2025: 1. Coronary calcium score of 0. This was <1 percentile for age and sex matched control.  2. Total plaque volume (TPV) 41 mm3 which is 23rd percentile for age-and sex matched controls (calcified plaque 0 mm3; non-calcified plaque 41 mm3). TPV is mild.  3. Normal coronary origin with right  dominance.  4. Mild proximal stenosis of left circumflex 30-49%, soft plaque.  CAD-RADS 2. Mild non-obstructive CAD (25-49%). Consider non-atherosclerotic causes of chest pain. Consider preventive therapy and risk factor modification  5. Radiology Overread: No acute extracardiac incidental findings.   Labs:    Latest Ref Rng & Units 12/15/2023    3:46 PM 08/17/2023    3:21 PM 04/30/2023    2:00 PM  CBC  WBC 3.8 - 10.8 Thousand/uL 5.3  3.8  3.6   Hemoglobin 11.7 - 15.5 g/dL 86.7  88.1  87.9   Hematocrit 35.0 - 45.0 % 41.6  35.9  36.7   Platelets 140 - 400 Thousand/uL 152  153  132        Latest Ref Rng & Units 10/19/2023    4:40 PM 10/19/2023    1:35 PM 08/17/2023    3:21 PM  BMP  Glucose 65 - 99 mg/dL   90   BUN 4 - 21  20     13    Creatinine 0.5 - 1.1  1.2     0.96   BUN/Creat Ratio 6 - 22 (calc)   SEE NOTE:   Sodium 137 - 147  142     141   Potassium 3.5 - 5.1 mEq/L 4.5      4.3   Chloride 99 - 108  106     108   CO2 13 - 22  23     27    Calcium 8.7 - 10.7  9.6     8.8      This result is from an external source.      Latest Ref Rng & Units 12/15/2023    3:46 PM 10/19/2023    4:40 PM 10/19/2023    1:35 PM  CMP  BUN 4 - 21   20      Creatinine 0.5 - 1.1   1.2      Sodium 137 - 147   142      Potassium 3.5 - 5.1 mEq/L  4.5  Chloride 99 - 108   106      CO2 13 - 22   23      Calcium 8.7 - 10.7   9.6      Total Protein 6.1 - 8.1 g/dL 7.4     Total Bilirubin 0.2 - 1.2 mg/dL 0.7     AST 10 - 35 U/L 19     ALT 6 - 29 U/L 16        This result is from an external source.    Lab Results  Component Value Date   CHOL 170 11/17/2022   HDL 57 11/17/2022   LDLCALC 91 11/17/2022   TRIG 128 11/17/2022   CHOLHDL 3.0 11/17/2022   No results for input(s): LIPOA in the last 8760 hours. No components found for: NTPROBNP No results for input(s): PROBNP in the last 8760 hours. No results for input(s): TSH in the last 8760 hours.  External Labs: Collected:  July 2024 TSH 4.3. Total cholesterol 177, triglycerides 181, HDL 53, LDL calculated 93. Hemoglobin A1c 5.2. BUN 17, creatinine 0.98. Sodium 138, potassium 4.4, chloride 103, bicarb 24  Physical Exam:    Today's Vitals   12/30/23 1341  BP: 122/80  Pulse: 78  Resp: 16  SpO2: 95%  Weight: 246 lb 6.4 oz (111.8 kg)  Height: 5' 8 (1.727 m)   Body mass index is 37.46 kg/m. Wt Readings from Last 3 Encounters:  12/30/23 246 lb 6.4 oz (111.8 kg)  12/17/23 242 lb (109.8 kg)  12/09/23 245 lb (111.1 kg)    Physical Exam  Constitutional: No distress.  hemodynamically stable  Neck: No JVD present.  Cardiovascular: Normal rate, regular rhythm, S1 normal and S2 normal. Exam reveals no gallop, no S3 and no S4.  Murmur heard. Systolic murmur is present. Pulmonary/Chest: Effort normal and breath sounds normal. No stridor. She has no wheezes. She has no rales.  Abdominal: Soft. Bowel sounds are normal. She exhibits no distension. There is no abdominal tenderness.  Musculoskeletal:        General: No edema.     Cervical back: Neck supple.  Neurological: She is alert and oriented to person, place, and time. She has intact cranial nerves (2-12).  Skin: Skin is warm.   Impression & Recommendation(s):  Impression:   ICD-10-CM   1. Coronary artery disease involving native coronary artery of native heart without angina pectoris  I25.10 EKG 12-Lead    2. Nonrheumatic aortic valve stenosis  I35.0     3. Benign hypertension  I10     4. Hypothyroidism, unspecified type  E03.9     5. Hypertriglyceridemia  E78.1     6. Class 2 obesity due to excess calories without serious comorbidity with body mass index (BMI) of 37.0 to 37.9 in adult  E66.812    E66.09    Z68.37        Recommendation(s):  Coronary artery disease involving native coronary artery of native heart without angina pectoris Denies anginal chest pain. Total coronary calcium score is 0, mild plaque volume, mild nonobstructive  coronary disease for coronary CTA January 2025 No indication for aspirin for now. Patient prefers to manage her lipids with lifestyle changes Reemphasized the importance of secondary prevention with focus on improving the modifiable cardiovascular risk factors such as glycemic control, lipid management, blood pressure control, weight loss.  Nonrheumatic aortic valve stenosis January 2025: Preserved LVEF, mild to moderate aortic stenosis Patient is advised to seek medical attention if she experiences chest pain,  heart failure symptoms, syncope Recommend follow-up echocardiogram around March 2027 Monitor clinically  Benign hypertension Office blood pressures are very well-controlled. Continue Toprol -XL 25 mg p.o. daily  Hypertriglyceridemia Prior triglyceride levels 181 mg/dL Currently focusing on lifestyle modifications, wants to avoid pharmacological therapy Recently had labs at 12/28/2023, will send us  a copy  Class 2 obesity due to excess calories without serious comorbidity with body mass index (BMI) of 37.0 to 37.9 in adult Body mass index is 37.46 kg/m. I reviewed with her importance of diet, regular physical activity/exercise, weight loss.   Patient is educated on the importance of increasing physical activity gradually as tolerated with a goal of moderate intensity exercise for 30 minutes a day 5 days a week.  Orders Placed:  Orders Placed This Encounter  Procedures   EKG 12-Lead   Final Medication List:    No orders of the defined types were placed in this encounter.   Medications Discontinued During This Encounter  Medication Reason   cloNIDine  (CATAPRES ) 0.1 MG tablet Patient Preference   amphetamine -dextroamphetamine  (ADDERALL XR) 20 MG 24 hr capsule Patient Preference   famotidine (PEPCID) 10 MG tablet Patient Preference     Current Outpatient Medications:    albuterol  (VENTOLIN  HFA) 108 (90 Base) MCG/ACT inhaler, Inhale 2 puffs into the lungs every 6 (six) hours  as needed for wheezing or shortness of breath., Disp: 6.7 g, Rfl: PRN   Azelastine  HCl (ASTEPRO  ALLERGY) 0.15 % SOLN, , Disp: , Rfl:    cetirizine (ZYRTEC) 10 MG tablet, Take 10 mg by mouth daily., Disp: , Rfl:    Cholecalciferol (VITAMIN D3) 1000 units CAPS, Take by mouth., Disp: , Rfl:    ENBREL  MINI 50 MG/ML injection, INJECT 50 MG UNDER THE SKIN ONCE A WEEK, Disp: 12 mL, Rfl: 0   MAGNESIUM PO, Take 500 mg by mouth daily., Disp: , Rfl:    methocarbamol  (ROBAXIN ) 500 MG tablet, Take 1 tablet (500 mg total) by mouth 2 (two) times daily as needed., Disp: 60 tablet, Rfl: 0   metoprolol  succinate (TOPROL -XL) 25 MG 24 hr tablet, Take 1 tablet (25 mg total) by mouth daily., Disp: 30 tablet, Rfl: 5   metroNIDAZOLE  (METROGEL ) 0.75 % gel, Apply 1 Application topically 2 (two) times daily., Disp: 45 g, Rfl: 5   montelukast  (SINGULAIR ) 10 MG tablet, Take 1 tablet (10 mg total) by mouth at bedtime., Disp: 90 tablet, Rfl: 3   Multiple Vitamin (MULTIVITAMIN) tablet, Take 1 tablet by mouth daily., Disp: , Rfl:    Omega-3 Fatty Acids (FISH OIL PO), Take by mouth daily., Disp: , Rfl:    promethazine  (PHENERGAN ) 25 MG tablet, Take 1 tablet (25 mg total) by mouth every 6 (six) hours as needed for nausea or vomiting., Disp: 30 tablet, Rfl: 2   SALINE MIST SPRAY NA, Place into the nose., Disp: , Rfl:    SUMAtriptan  (IMITREX ) 20 MG/ACT nasal spray, Place 1 spray (20 mg total) into the nose every 2 (two) hours as needed., Disp: 6 each, Rfl: 2   SYNTHROID  112 MCG tablet, Take 1 tablet (112 mcg total) by mouth in the morning on an empty stomach., Disp: 30 tablet, Rfl: 11   traMADol  (ULTRAM ) 50 MG tablet, Take 1 tablet (50 mg total) by mouth 2 (two) times daily as needed., Disp: 60 tablet, Rfl: 1  Consent:   N/A  Disposition:   1 year follow-up sooner if needed  Her questions and concerns were addressed to her satisfaction. She voices understanding of the recommendations  provided during this encounter.     Signed, Madonna Michele HAS, [facility]  A Division of [facility] [address] [place], [place] [address]  [place], [place] [address]

## 2024-01-01 ENCOUNTER — Encounter: Payer: Self-pay | Admitting: Internal Medicine

## 2024-01-04 ENCOUNTER — Encounter: Payer: Self-pay | Admitting: Cardiology

## 2024-01-11 DIAGNOSIS — L659 Nonscarring hair loss, unspecified: Secondary | ICD-10-CM | POA: Diagnosis not present

## 2024-01-11 DIAGNOSIS — E039 Hypothyroidism, unspecified: Secondary | ICD-10-CM | POA: Diagnosis not present

## 2024-01-11 DIAGNOSIS — E669 Obesity, unspecified: Secondary | ICD-10-CM | POA: Diagnosis not present

## 2024-01-11 DIAGNOSIS — R7301 Impaired fasting glucose: Secondary | ICD-10-CM | POA: Diagnosis not present

## 2024-01-13 ENCOUNTER — Other Ambulatory Visit: Payer: Self-pay

## 2024-01-27 DIAGNOSIS — H3322 Serous retinal detachment, left eye: Secondary | ICD-10-CM | POA: Diagnosis not present

## 2024-01-27 DIAGNOSIS — H43812 Vitreous degeneration, left eye: Secondary | ICD-10-CM | POA: Diagnosis not present

## 2024-01-27 DIAGNOSIS — Z961 Presence of intraocular lens: Secondary | ICD-10-CM | POA: Diagnosis not present

## 2024-01-28 ENCOUNTER — Other Ambulatory Visit: Payer: Self-pay | Admitting: Physician Assistant

## 2024-01-28 DIAGNOSIS — L4050 Arthropathic psoriasis, unspecified: Secondary | ICD-10-CM

## 2024-01-28 NOTE — Telephone Encounter (Signed)
 Last Fill: 11/12/2023  Labs: 12/15/2023 CBC WNL. Hepatic function panel WNL  TB Gold: 04/30/2023  TB gold negative   Next Visit: 05/16/2024  Last Visit: 12/09/2023  IK:Ednmpjupr arthropathy (HCC)   Current Dose per office note 12/09/2023: Enbrel  50 mg subcutaneous injections once weekly.   Okay to refill Enbrel ?

## 2024-02-08 ENCOUNTER — Other Ambulatory Visit: Payer: Self-pay | Admitting: Internal Medicine

## 2024-02-09 ENCOUNTER — Other Ambulatory Visit: Payer: Self-pay

## 2024-02-09 ENCOUNTER — Other Ambulatory Visit (HOSPITAL_COMMUNITY): Payer: Self-pay

## 2024-02-09 MED ORDER — SUMATRIPTAN 20 MG/ACT NA SOLN
1.0000 | NASAL | 2 refills | Status: AC | PRN
  Filled 2024-02-09: qty 6, 30d supply, fill #0

## 2024-02-28 ENCOUNTER — Encounter: Payer: Self-pay | Admitting: Rheumatology

## 2024-02-28 NOTE — Telephone Encounter (Signed)
 Contacted the patient and advised CD of right shoulder x-rays is ready and will be up front for pickup. Patient verbalized understanding.

## 2024-02-28 NOTE — Progress Notes (Signed)
 Sarah Horton

## 2024-02-29 DIAGNOSIS — M25511 Pain in right shoulder: Secondary | ICD-10-CM | POA: Diagnosis not present

## 2024-03-01 ENCOUNTER — Other Ambulatory Visit (HOSPITAL_COMMUNITY): Payer: Self-pay

## 2024-03-01 DIAGNOSIS — F900 Attention-deficit hyperactivity disorder, predominantly inattentive type: Secondary | ICD-10-CM | POA: Diagnosis not present

## 2024-03-01 MED ORDER — AMPHETAMINE-DEXTROAMPHET ER 20 MG PO CP24
20.0000 mg | ORAL_CAPSULE | Freq: Two times a day (BID) | ORAL | 0 refills | Status: DC
  Filled 2024-03-01 (×2): qty 60, 30d supply, fill #0

## 2024-03-23 DIAGNOSIS — J329 Chronic sinusitis, unspecified: Secondary | ICD-10-CM | POA: Diagnosis not present

## 2024-03-23 DIAGNOSIS — R0982 Postnasal drip: Secondary | ICD-10-CM | POA: Diagnosis not present

## 2024-03-23 DIAGNOSIS — J310 Chronic rhinitis: Secondary | ICD-10-CM | POA: Diagnosis not present

## 2024-03-24 ENCOUNTER — Encounter (HOSPITAL_COMMUNITY): Payer: Self-pay

## 2024-03-24 ENCOUNTER — Other Ambulatory Visit: Payer: Self-pay

## 2024-03-24 ENCOUNTER — Other Ambulatory Visit (HOSPITAL_COMMUNITY): Payer: Self-pay

## 2024-03-24 MED ORDER — CEFDINIR 300 MG PO CAPS
300.0000 mg | ORAL_CAPSULE | Freq: Two times a day (BID) | ORAL | 0 refills | Status: DC
  Filled 2024-03-24: qty 20, 10d supply, fill #0

## 2024-03-28 DIAGNOSIS — D3131 Benign neoplasm of right choroid: Secondary | ICD-10-CM | POA: Diagnosis not present

## 2024-03-28 DIAGNOSIS — H43812 Vitreous degeneration, left eye: Secondary | ICD-10-CM | POA: Diagnosis not present

## 2024-03-28 DIAGNOSIS — Z961 Presence of intraocular lens: Secondary | ICD-10-CM | POA: Diagnosis not present

## 2024-03-28 DIAGNOSIS — H4312 Vitreous hemorrhage, left eye: Secondary | ICD-10-CM | POA: Diagnosis not present

## 2024-04-07 ENCOUNTER — Other Ambulatory Visit: Payer: Self-pay | Admitting: Rheumatology

## 2024-04-07 DIAGNOSIS — Z9225 Personal history of immunosupression therapy: Secondary | ICD-10-CM

## 2024-04-07 DIAGNOSIS — Z111 Encounter for screening for respiratory tuberculosis: Secondary | ICD-10-CM

## 2024-04-07 DIAGNOSIS — Z79899 Other long term (current) drug therapy: Secondary | ICD-10-CM

## 2024-04-07 DIAGNOSIS — L4050 Arthropathic psoriasis, unspecified: Secondary | ICD-10-CM

## 2024-04-07 NOTE — Telephone Encounter (Signed)
 Last Fill: 01/28/2024  Labs: 12/15/2023 CBC WNL. Hepatic function panel WNL  TB Gold: 04/30/2023 Neg    Next Visit: 05/16/2024  Last Visit: 12/09/2023  DX: Psoriatic arthropathy   Current Dose per office note 12/09/2023: Enbrel  50 mg subcutaneous injections once weekly.   Left message to advise patient she is due to update labs.   Okay to refill Enbrel ?

## 2024-04-19 ENCOUNTER — Other Ambulatory Visit: Payer: Self-pay | Admitting: *Deleted

## 2024-04-19 DIAGNOSIS — E039 Hypothyroidism, unspecified: Secondary | ICD-10-CM | POA: Diagnosis not present

## 2024-04-19 DIAGNOSIS — Z79899 Other long term (current) drug therapy: Secondary | ICD-10-CM

## 2024-04-19 DIAGNOSIS — I129 Hypertensive chronic kidney disease with stage 1 through stage 4 chronic kidney disease, or unspecified chronic kidney disease: Secondary | ICD-10-CM | POA: Diagnosis not present

## 2024-04-19 DIAGNOSIS — Z111 Encounter for screening for respiratory tuberculosis: Secondary | ICD-10-CM | POA: Diagnosis not present

## 2024-04-19 DIAGNOSIS — N1831 Chronic kidney disease, stage 3a: Secondary | ICD-10-CM | POA: Diagnosis not present

## 2024-04-19 DIAGNOSIS — Z9225 Personal history of immunosupression therapy: Secondary | ICD-10-CM

## 2024-04-19 DIAGNOSIS — E669 Obesity, unspecified: Secondary | ICD-10-CM | POA: Diagnosis not present

## 2024-04-19 LAB — LAB REPORT - SCANNED
Albumin, Urine POC: 25
Creatinine, POC: 244.6 mg/dL
Microalb Creat Ratio: 10
PTH, Intact: 24

## 2024-04-21 LAB — CBC WITH DIFFERENTIAL/PLATELET
Absolute Lymphocytes: 2308 {cells}/uL (ref 850–3900)
Absolute Monocytes: 463 {cells}/uL (ref 200–950)
Basophils Absolute: 29 {cells}/uL (ref 0–200)
Basophils Relative: 0.7 %
Eosinophils Absolute: 41 {cells}/uL (ref 15–500)
Eosinophils Relative: 1 %
HCT: 38.6 % (ref 35.0–45.0)
Hemoglobin: 12.7 g/dL (ref 11.7–15.5)
MCH: 27.4 pg (ref 27.0–33.0)
MCHC: 32.9 g/dL (ref 32.0–36.0)
MCV: 83.4 fL (ref 80.0–100.0)
MPV: 11 fL (ref 7.5–12.5)
Monocytes Relative: 11.3 %
Neutro Abs: 1259 {cells}/uL — ABNORMAL LOW (ref 1500–7800)
Neutrophils Relative %: 30.7 %
Platelets: 144 Thousand/uL (ref 140–400)
RBC: 4.63 Million/uL (ref 3.80–5.10)
RDW: 13.8 % (ref 11.0–15.0)
Total Lymphocyte: 56.3 %
WBC: 4.1 Thousand/uL (ref 3.8–10.8)

## 2024-04-21 LAB — QUANTIFERON-TB GOLD PLUS
Mitogen-NIL: 8.48 [IU]/mL
NIL: 0.02 [IU]/mL
QuantiFERON-TB Gold Plus: NEGATIVE
TB1-NIL: 0 [IU]/mL
TB2-NIL: 0.01 [IU]/mL

## 2024-04-21 LAB — COMPREHENSIVE METABOLIC PANEL WITH GFR
AG Ratio: 1.5 (calc) (ref 1.0–2.5)
ALT: 17 U/L (ref 6–29)
AST: 19 U/L (ref 10–35)
Albumin: 4.1 g/dL (ref 3.6–5.1)
Alkaline phosphatase (APISO): 75 U/L (ref 37–153)
BUN/Creatinine Ratio: 19 (calc) (ref 6–22)
BUN: 20 mg/dL (ref 7–25)
CO2: 24 mmol/L (ref 20–32)
Calcium: 9.5 mg/dL (ref 8.6–10.4)
Chloride: 106 mmol/L (ref 98–110)
Creat: 1.05 mg/dL — ABNORMAL HIGH (ref 0.60–1.00)
Globulin: 2.8 g/dL (ref 1.9–3.7)
Glucose, Bld: 93 mg/dL (ref 65–99)
Potassium: 4.5 mmol/L (ref 3.5–5.3)
Sodium: 139 mmol/L (ref 135–146)
Total Bilirubin: 0.5 mg/dL (ref 0.2–1.2)
Total Protein: 6.9 g/dL (ref 6.1–8.1)
eGFR: 57 mL/min/1.73m2 — ABNORMAL LOW (ref >=60)

## 2024-04-24 ENCOUNTER — Ambulatory Visit: Payer: Self-pay | Admitting: Rheumatology

## 2024-04-24 NOTE — Progress Notes (Signed)
 CBC is a stable, creatinine is elevated and stable.  Please forward results to her PCP.

## 2024-04-26 ENCOUNTER — Other Ambulatory Visit (HOSPITAL_COMMUNITY): Payer: Self-pay

## 2024-05-01 ENCOUNTER — Other Ambulatory Visit: Payer: Self-pay | Admitting: Rheumatology

## 2024-05-01 DIAGNOSIS — L4050 Arthropathic psoriasis, unspecified: Secondary | ICD-10-CM

## 2024-05-01 NOTE — Telephone Encounter (Signed)
 Last Fill: 04/07/2024   Labs: 04/19/2024 CBC is a stable, creatinine is elevated and stable.   TB Gold: 04/19/2024 negative    Next Visit: 05/16/2024  Last Visit: 12/09/2023  IK:Ednmpjupr arthropathy   Current Dose per office note on 12/09/2023: Enbrel  50 mg subcutaneous injections once weekly.   Okay to refill Enbrel ?

## 2024-05-03 NOTE — Progress Notes (Deleted)
 Office Visit Note  Patient: Sarah Horton             Date of Birth: 04-Jul-1950           MRN: [id number]             PCP: Perri Ronal PARAS, MD Referring: Perri Ronal PARAS, MD Visit Date: 05/16/2024 Occupation: Data Unavailable  Subjective:  No chief complaint on file.   History of Present Illness: Sarah Horton is a 73 y.o. female ***     Activities of Daily Living:  Patient reports morning stiffness for *** {minute/hour:19697}.   Patient {ACTIONS;DENIES/REPORTS:21021675::Denies} nocturnal pain.  Difficulty dressing/grooming: {ACTIONS;DENIES/REPORTS:21021675::Denies} Difficulty climbing stairs: {ACTIONS;DENIES/REPORTS:21021675::Denies} Difficulty getting out of chair: {ACTIONS;DENIES/REPORTS:21021675::Denies} Difficulty using hands for taps, buttons, cutlery, and/or writing: {ACTIONS;DENIES/REPORTS:21021675::Denies}  No Rheumatology ROS completed.   PMFS History:  Patient Active Problem List   Diagnosis Date Noted   CAD (coronary artery disease) 09/01/2023   Aortic stenosis 09/01/2023   History of peripheral neuropathy 11/20/2016   Left wrist tendonitis/ history of surgery for Dequervains  11/20/2016   Raynaud's disease without gangrene 09/22/2016   High risk medication use 09/22/2016   History of ulcer of lower limb/ Venous stasis ulcer 01/2015  09/22/2016   Osteopenia of multiple sites 09/22/2016   Psoriasis 09/03/2016   Chest wall pain 07/16/2016   Flank pain, acute 05/15/2016   Subacromial bursitis of right shoulder joint 04/10/2016   Low back pain 04/09/2016   Pre-ulcerative calluses 02/11/2016   Varicose veins of right lower extremity with complications 08/12/2015   Elevated BP 08/09/2015   Varicose veins of left lower extremity with complications 07/29/2015   Varicose veins of bilateral lower extremities with other complications 07/09/2015   Varicose veins of lower extremities with ulcer (HCC) 04/05/2015   Status post bilateral knee  replacements 12/13/2013   Venous stasis ulcer of ankle (HCC) 06/06/2013   ADD (attention deficit disorder) 04/04/2012   INSOMNIA 06/20/2010   Hypothyroidism 01/17/2010   Obesity 01/17/2010   Osteoarthritis 01/17/2010   ANEMIA 10/02/2009   SINUSITIS, ACUTE 10/02/2009   Asthma 10/02/2009   Venous (peripheral) insufficiency 03/11/2008   Allergic rhinitis 03/11/2008   BRONCHITIS, RECURRENT 03/11/2008   History of migraine 03/11/2008   Anxiety state 11/24/2007   Hereditary and idiopathic peripheral neuropathy 11/24/2007   GERD 11/24/2007   IRRITABLE BOWEL SYNDROME 11/24/2007   PSORIATIC ARTHROPATHY 11/24/2007    Past Medical History:  Diagnosis Date   Allergic rhinitis, cause unspecified    Allergy    Anemia, unspecified    Anxiety state, unspecified    Aortic stenosis 09/01/2023   TTE 06/25/2023: EF 60-65, no RWMA, GLS -24, normal RVSF, normal PASP, trivial MR, mild-moderate AS (mean gradient 12 mmHg, V-max 214 cm/s DI 0.48)    Arthritis    Asthma 2013   diagnosed by NP   CAD (coronary artery disease) 09/01/2023   Mild nonobstructive-CCTA 06/16/2023: CAC score 0; mild total plaque volume 41 mm (23rd percentile-calcified 0 mm, noncalcified 41 mm; LCx proximal 30-49 (soft plaque), OM1/artifact    Cataract    bilaterally removed 2003,2007   Chronic kidney disease 2022   COPD (chronic obstructive pulmonary disease) (HCC) since 1994   Repetitive bronchitis   Esophageal reflux    Heart murmur    confirmed by Dr. Michele 2024   Hypertension 2023   Dr. Dolan prescribed metopropolol   Irritable bowel syndrome    Migraine    Obesity, unspecified    Psoriatic arthropathy (HCC)  Ulcer 2017 - Venous Ulcers   Laser ablation to correct   Unspecified chronic bronchitis (HCC)    Unspecified hereditary and idiopathic peripheral neuropathy    Unspecified hypothyroidism    Unspecified venous (peripheral) insufficiency     Family History  Problem Relation Age of Onset   COPD  Mother    Heart disease Mother    Hypertension Mother    Varicose Veins Mother    Asthma Mother    Cirrhosis Father    Thyroid  disease Sister    Breast cancer Maternal Grandmother    Cancer Maternal Grandmother    Stroke Maternal Grandmother    Breast cancer Other        1st cousin    Pancreatic cancer Other        1st cousin    Lymphoma Cousin    Heart disease Paternal Grandmother    Arthritis Maternal Aunt    Cancer Maternal Uncle    Colon cancer Neg Hx    Colon polyps Neg Hx    Esophageal cancer Neg Hx    Rectal cancer Neg Hx    Stomach cancer Neg Hx    Past Surgical History:  Procedure Laterality Date   ABDOMINAL HYSTERECTOMY     ABDOMINAL HYSTERECTOMY     ABLATION ON ENDOMETRIOSIS     x2   Bilat TKRs  04/2010   by [name]   BUNIONECTOMY  2009   CATARACT EXTRACTION, BILATERAL     2003,2007   COLONOSCOPY  08/26/2004   with gessner tics and hems    dequervains tensenovitis Left    EYE SURGERY  2003 and 2007   Cataract - both eyes   FRACTURE SURGERY  2000   Not a fracture but no other place to record:  Sinus restructure   JOINT REPLACEMENT Bilateral    knees   NASAL SINUS SURGERY     SHOULDER SURGERY  2005   TOTAL SHOULDER ARTHROPLASTY     TUBAL LIGATION  1985   during Endometrial repairs   Social History   Tobacco Use   Smoking status: Former    Current packs/day: 0.00    Average packs/day: 1.5 packs/day for 22.0 years (33.0 ttl pk-yrs)    Types: Cigarettes    Start date: 06/08/1970    Quit date: 06/08/1992    Years since quitting: 31.9    Passive exposure: Never   Smokeless tobacco: Never  Vaping Use   Vaping status: Never Used  Substance Use Topics   Alcohol use: Yes    Comment: a glass of wine approximately 2 or 3 X year   Drug use: Never   Social History   Social History Narrative   Not on file     Immunization History  Administered Date(s) Administered   Fluad Quad(high Dose 65+) 05/16/2022   H1N1 05/18/2008   Influenza Inj Mdck Quad  Pf 04/01/2017   Influenza Inj Mdck Quad With Preservative 04/13/2018   Influenza Split 03/23/2011, 03/14/2012, 03/23/2013, 03/22/2016   Influenza Whole 03/08/2008, 03/08/2009, 03/07/2010   Influenza,inj,Quad PF,6+ Mos 03/21/2014, 03/31/2019, 04/03/2021   PFIZER(Purple Top)SARS-COV-2 Vaccination 08/22/2019, 09/12/2019   PNEUMOCOCCAL CONJUGATE-20 11/20/2022   Pneumococcal Conjugate-13 06/28/2019   Td 01/06/2002   Tdap 03/14/2012   Zoster Recombinant(Shingrix) 03/08/2018     Objective: Vital Signs: There were no vitals taken for this visit.   Physical Exam   Musculoskeletal Exam: ***  CDAI Exam: CDAI Score: -- Patient Global: --; Provider Global: -- Swollen: --; Tender: -- Joint Exam 05/16/2024  No joint exam has been documented for this visit   There is currently no information documented on the homunculus. Go to the Rheumatology activity and complete the homunculus joint exam.  Investigation: No additional findings.  Imaging: No results found.  Recent Labs: Lab Results  Component Value Date   WBC 4.1 04/19/2024   HGB 12.7 04/19/2024   PLT 144 04/19/2024   NA 139 04/19/2024   K 4.5 04/19/2024   CL 106 04/19/2024   CO2 24 04/19/2024   GLUCOSE 93 04/19/2024   BUN 20 04/19/2024   CREATININE 1.05 (H) 04/19/2024   BILITOT 0.5 04/19/2024   ALKPHOS 87 01/25/2017   AST 19 04/19/2024   ALT 17 04/19/2024   PROT 6.9 04/19/2024   ALBUMIN 4.2 10/19/2023   CALCIUM 9.5 04/19/2024   GFRAA 71 11/29/2020   QFTBGOLDPLUS NEGATIVE 04/19/2024    Speciality Comments: Prior therapy: Methotrexate  (elevated creatinine),Arava -pt dcd  Procedures:  No procedures performed Allergies: Nucynta [tapentadol], Codeine, Penicillins, Tessalon  [benzonatate ], and Other   Assessment / Plan:     Visit Diagnoses: No diagnosis found.  Orders: No orders of the defined types were placed in this encounter.  No orders of the defined types were placed in this encounter.   Face-to-face time  spent with patient was *** minutes. Greater than 50% of time was spent in counseling and coordination of care.  Follow-Up Instructions: No follow-ups on file.   Daved JAYSON Gavel, CMA  Note - This record has been created using Animal nutritionist.  Chart creation errors have been sought, but may not always  have been located. Such creation errors do not reflect on  the standard of medical care.

## 2024-05-10 ENCOUNTER — Other Ambulatory Visit (HOSPITAL_COMMUNITY): Payer: Self-pay

## 2024-05-11 ENCOUNTER — Other Ambulatory Visit (HOSPITAL_COMMUNITY): Payer: Self-pay

## 2024-05-11 ENCOUNTER — Other Ambulatory Visit: Payer: Self-pay

## 2024-05-11 MED ORDER — AMPHETAMINE-DEXTROAMPHET ER 20 MG PO CP24
20.0000 mg | ORAL_CAPSULE | Freq: Two times a day (BID) | ORAL | 0 refills | Status: DC
  Filled 2024-05-11 – 2024-05-12 (×2): qty 60, 30d supply, fill #0

## 2024-05-12 ENCOUNTER — Other Ambulatory Visit (HOSPITAL_COMMUNITY): Payer: Self-pay

## 2024-05-13 ENCOUNTER — Other Ambulatory Visit (HOSPITAL_COMMUNITY): Payer: Self-pay

## 2024-05-15 ENCOUNTER — Other Ambulatory Visit (HOSPITAL_COMMUNITY): Payer: Self-pay

## 2024-05-15 ENCOUNTER — Other Ambulatory Visit: Payer: Self-pay

## 2024-05-16 ENCOUNTER — Ambulatory Visit: Admitting: Rheumatology

## 2024-05-16 DIAGNOSIS — M7062 Trochanteric bursitis, left hip: Secondary | ICD-10-CM

## 2024-05-16 DIAGNOSIS — M19042 Primary osteoarthritis, left hand: Secondary | ICD-10-CM

## 2024-05-16 DIAGNOSIS — G8929 Other chronic pain: Secondary | ICD-10-CM

## 2024-05-16 DIAGNOSIS — Z8719 Personal history of other diseases of the digestive system: Secondary | ICD-10-CM

## 2024-05-16 DIAGNOSIS — I7300 Raynaud's syndrome without gangrene: Secondary | ICD-10-CM

## 2024-05-16 DIAGNOSIS — G609 Hereditary and idiopathic neuropathy, unspecified: Secondary | ICD-10-CM

## 2024-05-16 DIAGNOSIS — M8589 Other specified disorders of bone density and structure, multiple sites: Secondary | ICD-10-CM

## 2024-05-16 DIAGNOSIS — Z8669 Personal history of other diseases of the nervous system and sense organs: Secondary | ICD-10-CM

## 2024-05-16 DIAGNOSIS — Z96653 Presence of artificial knee joint, bilateral: Secondary | ICD-10-CM

## 2024-05-16 DIAGNOSIS — Z8709 Personal history of other diseases of the respiratory system: Secondary | ICD-10-CM

## 2024-05-16 DIAGNOSIS — Z79899 Other long term (current) drug therapy: Secondary | ICD-10-CM

## 2024-05-16 DIAGNOSIS — Z8679 Personal history of other diseases of the circulatory system: Secondary | ICD-10-CM

## 2024-05-16 DIAGNOSIS — L409 Psoriasis, unspecified: Secondary | ICD-10-CM

## 2024-05-16 DIAGNOSIS — Z8639 Personal history of other endocrine, nutritional and metabolic disease: Secondary | ICD-10-CM

## 2024-05-16 DIAGNOSIS — M461 Sacroiliitis, not elsewhere classified: Secondary | ICD-10-CM

## 2024-05-16 DIAGNOSIS — L4050 Arthropathic psoriasis, unspecified: Secondary | ICD-10-CM

## 2024-05-16 DIAGNOSIS — Z872 Personal history of diseases of the skin and subcutaneous tissue: Secondary | ICD-10-CM

## 2024-05-16 DIAGNOSIS — F5101 Primary insomnia: Secondary | ICD-10-CM

## 2024-05-17 NOTE — Progress Notes (Unsigned)
 Office Visit Note  Patient: Sarah Horton             Date of Birth: [birth year]           MRN: [id number]             PCP: Perri Ronal PARAS, MD Referring: Perri Ronal PARAS, MD Visit Date: 05/19/2024 Occupation: Data Unavailable  Subjective:  No chief complaint on file.   History of Present Illness: Sarah Horton is a 73 y.o. female ***     Activities of Daily Living:  Patient reports morning stiffness for *** {minute/hour:19697}.   Patient {ACTIONS;DENIES/REPORTS:21021675::Denies} nocturnal pain.  Difficulty dressing/grooming: {ACTIONS;DENIES/REPORTS:21021675::Denies} Difficulty climbing stairs: {ACTIONS;DENIES/REPORTS:21021675::Denies} Difficulty getting out of chair: {ACTIONS;DENIES/REPORTS:21021675::Denies} Difficulty using hands for taps, buttons, cutlery, and/or writing: {ACTIONS;DENIES/REPORTS:21021675::Denies}  No Rheumatology ROS completed.   PMFS History:  Patient Active Problem List   Diagnosis Date Noted   CAD (coronary artery disease) 09/01/2023   Aortic stenosis 09/01/2023   History of peripheral neuropathy 11/20/2016   Left wrist tendonitis/ history of surgery for Dequervains  11/20/2016   Raynaud's disease without gangrene 09/22/2016   High risk medication use 09/22/2016   History of ulcer of lower limb/ Venous stasis ulcer 01/2015  09/22/2016   Osteopenia of multiple sites 09/22/2016   Psoriasis 09/03/2016   Chest wall pain 07/16/2016   Flank pain, acute 05/15/2016   Subacromial bursitis of right shoulder joint 04/10/2016   Low back pain 04/09/2016   Pre-ulcerative calluses 02/11/2016   Varicose veins of right lower extremity with complications 08/12/2015   Elevated BP 08/09/2015   Varicose veins of left lower extremity with complications 07/29/2015   Varicose veins of bilateral lower extremities with other complications 07/09/2015   Varicose veins of lower extremities with ulcer (HCC) 04/05/2015   Status post bilateral knee  replacements 12/13/2013   Venous stasis ulcer of ankle (HCC) 06/06/2013   ADD (attention deficit disorder) 04/04/2012   INSOMNIA 06/20/2010   Hypothyroidism 01/17/2010   Obesity 01/17/2010   Osteoarthritis 01/17/2010   ANEMIA 10/02/2009   SINUSITIS, ACUTE 10/02/2009   Asthma 10/02/2009   Venous (peripheral) insufficiency 03/11/2008   Allergic rhinitis 03/11/2008   BRONCHITIS, RECURRENT 03/11/2008   History of migraine 03/11/2008   Anxiety state 11/24/2007   Hereditary and idiopathic peripheral neuropathy 11/24/2007   GERD 11/24/2007   IRRITABLE BOWEL SYNDROME 11/24/2007   PSORIATIC ARTHROPATHY 11/24/2007    Past Medical History:  Diagnosis Date   Allergic rhinitis, cause unspecified    Allergy    Anemia, unspecified    Anxiety state, unspecified    Aortic stenosis 09/01/2023   TTE 06/25/2023: EF 60-65, no RWMA, GLS -24, normal RVSF, normal PASP, trivial MR, mild-moderate AS (mean gradient 12 mmHg, V-max 214 cm/s DI 0.48)    Arthritis    Asthma 2013   diagnosed by NP   CAD (coronary artery disease) 09/01/2023   Mild nonobstructive-CCTA 06/16/2023: CAC score 0; mild total plaque volume 41 mm (23rd percentile-calcified 0 mm, noncalcified 41 mm; LCx proximal 30-49 (soft plaque), OM1/artifact    Cataract    bilaterally removed 2003,2007   Chronic kidney disease 2022   COPD (chronic obstructive pulmonary disease) (HCC) since 1994   Repetitive bronchitis   Esophageal reflux    Heart murmur    confirmed by Dr. Michele 2024   Hypertension 2023   Dr. Dolan prescribed metopropolol   Irritable bowel syndrome    Migraine    Obesity, unspecified    Psoriatic arthropathy (HCC)  Ulcer 2017 - Venous Ulcers   Laser ablation to correct   Unspecified chronic bronchitis (HCC)    Unspecified hereditary and idiopathic peripheral neuropathy    Unspecified hypothyroidism    Unspecified venous (peripheral) insufficiency     Family History  Problem Relation Age of Onset   COPD  Mother    Heart disease Mother    Hypertension Mother    Varicose Veins Mother    Asthma Mother    Cirrhosis Father    Thyroid  disease Sister    Breast cancer Maternal Grandmother    Cancer Maternal Grandmother    Stroke Maternal Grandmother    Breast cancer Other        1st cousin    Pancreatic cancer Other        1st cousin    Lymphoma Cousin    Heart disease Paternal Grandmother    Arthritis Maternal Aunt    Cancer Maternal Uncle    Colon cancer Neg Hx    Colon polyps Neg Hx    Esophageal cancer Neg Hx    Rectal cancer Neg Hx    Stomach cancer Neg Hx    Past Surgical History:  Procedure Laterality Date   ABDOMINAL HYSTERECTOMY     ABDOMINAL HYSTERECTOMY     ABLATION ON ENDOMETRIOSIS     x2   Bilat TKRs  04/2010   by [name]   BUNIONECTOMY  2009   CATARACT EXTRACTION, BILATERAL     2003,2007   COLONOSCOPY  08/26/2004   with gessner tics and hems    dequervains tensenovitis Left    EYE SURGERY  2003 and 2007   Cataract - both eyes   FRACTURE SURGERY  2000   Not a fracture but no other place to record:  Sinus restructure   JOINT REPLACEMENT Bilateral    knees   NASAL SINUS SURGERY     SHOULDER SURGERY  2005   TOTAL SHOULDER ARTHROPLASTY     TUBAL LIGATION  1985   during Endometrial repairs   Social History   Tobacco Use   Smoking status: Former    Current packs/day: 0.00    Average packs/day: 1.5 packs/day for 22.0 years (33.0 ttl pk-yrs)    Types: Cigarettes    Start date: 06/08/1970    Quit date: 06/08/1992    Years since quitting: 31.9    Passive exposure: Never   Smokeless tobacco: Never  Vaping Use   Vaping status: Never Used  Substance Use Topics   Alcohol use: Yes    Comment: a glass of wine approximately 2 or 3 X year   Drug use: Never   Social History   Social History Narrative   Not on file     Immunization History  Administered Date(s) Administered   Fluad Quad(high Dose 65+) 05/16/2022   H1N1 05/18/2008   Influenza Inj Mdck Quad  Pf 04/01/2017   Influenza Inj Mdck Quad With Preservative 04/13/2018   Influenza Split 03/23/2011, 03/14/2012, 03/23/2013, 03/22/2016   Influenza Whole 03/08/2008, 03/08/2009, 03/07/2010   Influenza,inj,Quad PF,6+ Mos 03/21/2014, 03/31/2019, 04/03/2021   PFIZER(Purple Top)SARS-COV-2 Vaccination 08/22/2019, 09/12/2019   PNEUMOCOCCAL CONJUGATE-20 11/20/2022   Pneumococcal Conjugate-13 06/28/2019   Td 01/06/2002   Tdap 03/14/2012   Zoster Recombinant(Shingrix) 03/08/2018     Objective: Vital Signs: There were no vitals taken for this visit.   Physical Exam   Musculoskeletal Exam: ***  CDAI Exam: CDAI Score: -- Patient Global: --; Provider Global: -- Swollen: --; Tender: -- Joint Exam 05/19/2024  No joint exam has been documented for this visit   There is currently no information documented on the homunculus. Go to the Rheumatology activity and complete the homunculus joint exam.  Investigation: No additional findings.  Imaging: No results found.  Recent Labs: Lab Results  Component Value Date   WBC 4.1 04/19/2024   HGB 12.7 04/19/2024   PLT 144 04/19/2024   NA 139 04/19/2024   K 4.5 04/19/2024   CL 106 04/19/2024   CO2 24 04/19/2024   GLUCOSE 93 04/19/2024   BUN 20 04/19/2024   CREATININE 1.05 (H) 04/19/2024   BILITOT 0.5 04/19/2024   ALKPHOS 87 01/25/2017   AST 19 04/19/2024   ALT 17 04/19/2024   PROT 6.9 04/19/2024   ALBUMIN 4.2 10/19/2023   CALCIUM 9.5 04/19/2024   GFRAA 71 11/29/2020   QFTBGOLDPLUS NEGATIVE 04/19/2024    Speciality Comments: Prior therapy: Methotrexate  (elevated creatinine),Arava -pt dcd  Procedures:  No procedures performed Allergies: Nucynta [tapentadol], Codeine, Penicillins, Tessalon  [benzonatate ], and Other   Assessment / Plan:     Visit Diagnoses: Psoriatic arthropathy (HCC)  Psoriasis  High risk medication use  Sacroiliitis  Chronic right shoulder pain  Primary osteoarthritis of both hands  Status post bilateral  knee replacements  Trochanteric bursitis, left hip  Neck stiffness  Raynaud's disease without gangrene  Osteopenia of multiple sites  History of hypertension  History of ulcer of lower limb/ Venous stasis ulcer 01/2015   History of gastroesophageal reflux (GERD)  History of IBS  History of hypothyroidism  History of migraine  Primary insomnia  History of asthma  Hereditary and idiopathic peripheral neuropathy  Orders: No orders of the defined types were placed in this encounter.  No orders of the defined types were placed in this encounter.   Face-to-face time spent with patient was *** minutes. Greater than 50% of time was spent in counseling and coordination of care.  Follow-Up Instructions: No follow-ups on file.   Maya Nash, MD  Note - This record has been created using Animal nutritionist.  Chart creation errors have been sought, but may not always  have been located. Such creation errors do not reflect on  the standard of medical care.

## 2024-05-18 ENCOUNTER — Other Ambulatory Visit: Payer: Self-pay

## 2024-05-18 ENCOUNTER — Other Ambulatory Visit (HOSPITAL_COMMUNITY): Payer: Self-pay

## 2024-05-18 MED ORDER — AMPHETAMINE-DEXTROAMPHETAMINE 10 MG PO TABS
5.0000 mg | ORAL_TABLET | Freq: Two times a day (BID) | ORAL | 0 refills | Status: DC
  Filled 2024-05-18: qty 30, 30d supply, fill #0

## 2024-05-19 ENCOUNTER — Encounter: Payer: Self-pay | Admitting: Rheumatology

## 2024-05-19 ENCOUNTER — Other Ambulatory Visit (HOSPITAL_BASED_OUTPATIENT_CLINIC_OR_DEPARTMENT_OTHER): Payer: Self-pay

## 2024-05-19 ENCOUNTER — Other Ambulatory Visit (HOSPITAL_COMMUNITY): Payer: Self-pay

## 2024-05-19 ENCOUNTER — Other Ambulatory Visit: Payer: Self-pay

## 2024-05-19 ENCOUNTER — Ambulatory Visit: Attending: Rheumatology | Admitting: Rheumatology

## 2024-05-19 VITALS — BP 144/78 | HR 85 | Temp 98.0°F | Resp 15 | Ht 68.0 in | Wt 243.8 lb

## 2024-05-19 DIAGNOSIS — M25511 Pain in right shoulder: Secondary | ICD-10-CM | POA: Diagnosis not present

## 2024-05-19 DIAGNOSIS — M436 Torticollis: Secondary | ICD-10-CM | POA: Diagnosis not present

## 2024-05-19 DIAGNOSIS — I7300 Raynaud's syndrome without gangrene: Secondary | ICD-10-CM | POA: Diagnosis not present

## 2024-05-19 DIAGNOSIS — L4050 Arthropathic psoriasis, unspecified: Secondary | ICD-10-CM | POA: Diagnosis not present

## 2024-05-19 DIAGNOSIS — Z79899 Other long term (current) drug therapy: Secondary | ICD-10-CM

## 2024-05-19 DIAGNOSIS — M461 Sacroiliitis, not elsewhere classified: Secondary | ICD-10-CM

## 2024-05-19 DIAGNOSIS — Z8719 Personal history of other diseases of the digestive system: Secondary | ICD-10-CM

## 2024-05-19 DIAGNOSIS — Z872 Personal history of diseases of the skin and subcutaneous tissue: Secondary | ICD-10-CM

## 2024-05-19 DIAGNOSIS — Z8709 Personal history of other diseases of the respiratory system: Secondary | ICD-10-CM

## 2024-05-19 DIAGNOSIS — G609 Hereditary and idiopathic neuropathy, unspecified: Secondary | ICD-10-CM

## 2024-05-19 DIAGNOSIS — R7989 Other specified abnormal findings of blood chemistry: Secondary | ICD-10-CM

## 2024-05-19 DIAGNOSIS — L409 Psoriasis, unspecified: Secondary | ICD-10-CM

## 2024-05-19 DIAGNOSIS — Z8669 Personal history of other diseases of the nervous system and sense organs: Secondary | ICD-10-CM

## 2024-05-19 DIAGNOSIS — Z8639 Personal history of other endocrine, nutritional and metabolic disease: Secondary | ICD-10-CM

## 2024-05-19 DIAGNOSIS — M8589 Other specified disorders of bone density and structure, multiple sites: Secondary | ICD-10-CM

## 2024-05-19 DIAGNOSIS — Z8679 Personal history of other diseases of the circulatory system: Secondary | ICD-10-CM

## 2024-05-19 DIAGNOSIS — M7062 Trochanteric bursitis, left hip: Secondary | ICD-10-CM | POA: Diagnosis not present

## 2024-05-19 DIAGNOSIS — M19042 Primary osteoarthritis, left hand: Secondary | ICD-10-CM

## 2024-05-19 DIAGNOSIS — M19041 Primary osteoarthritis, right hand: Secondary | ICD-10-CM

## 2024-05-19 DIAGNOSIS — Z96653 Presence of artificial knee joint, bilateral: Secondary | ICD-10-CM

## 2024-05-19 DIAGNOSIS — M546 Pain in thoracic spine: Secondary | ICD-10-CM | POA: Diagnosis not present

## 2024-05-19 DIAGNOSIS — G8929 Other chronic pain: Secondary | ICD-10-CM

## 2024-05-19 DIAGNOSIS — F5101 Primary insomnia: Secondary | ICD-10-CM

## 2024-05-19 MED ORDER — METHOCARBAMOL 500 MG PO TABS
500.0000 mg | ORAL_TABLET | Freq: Two times a day (BID) | ORAL | 0 refills | Status: AC | PRN
  Filled 2024-05-19: qty 60, 30d supply, fill #0

## 2024-05-19 NOTE — Patient Instructions (Signed)
 Standing Labs We placed an order today for your standing lab work.   Please have your standing labs drawn in February and every 3 months  Please have your labs drawn 2 weeks prior to your appointment so that the provider can discuss your lab results at your appointment, if possible.  Please note that you may see your imaging and lab results in MyChart before we have reviewed them. We will contact you once all results are reviewed. Please allow our office up to 72 hours to thoroughly review all of the results before contacting the office for clarification of your results.  WALK-IN LAB HOURS  Monday through Thursday from 8:00 am - 4:30 pm and Friday from 8:00 am-12:00 pm.  Patients with office visits requiring labs will be seen before walk-in labs.  You may encounter longer than normal wait times. Please allow additional time. Wait times may be shorter on  Monday and Thursday afternoons.  We do not book appointments for walk-in labs. We appreciate your patience and understanding with our staff.   Labs are drawn by [facility]. Please bring your co-pay at the time of your lab draw.  You may receive a bill from [facility] for your lab work.  Please note if you are on Hydroxychloroquine and and an order has been placed for a Hydroxychloroquine level,  you will need to have it drawn 4 hours or more after your last dose.  If you wish to have your labs drawn at another location, please call the office 24 hours in advance so we can fax the orders.  The office is located at [address], [place], [place] [address]   If you have any questions regarding directions or hours of operation,  please call [phone].   As a reminder, please drink plenty of water prior to coming for your lab work. Thanks!   Vaccines You are taking a medication(s) that can suppress your immune system.  The following immunizations are recommended: Flu annually Covid-19  RSV Td/Tdap (tetanus, diphtheria, pertussis)  every 10 years Pneumonia (Prevnar 15 then Pneumovax 23 at least 1 year apart.  Alternatively, can take Prevnar 20 without needing additional dose) Shingrix: 2 doses from 4 weeks to 6 months apart  Please check with your PCP to make sure you are up to date.   If you have signs or symptoms of an infection or start antibiotics: First, call your PCP for workup of your infection. Hold your medication through the infection, until you complete your antibiotics, and until symptoms resolve if you take the following: Injectable medication (Actemra, Benlysta, Cimzia, Cosentyx , Enbrel , Humira, Kevzara, Orencia, Remicade, Simponi, Stelara, Taltz, Tremfya) Methotrexate  Leflunomide  (Arava ) Mycophenolate (Cellcept) Earma, Olumiant, or Rinvoq   Please get an annual skin examination to screen for skin cancer while you are on Enbrel .  Please use NSAIDs and protection.  Low Back Sprain or Strain Rehab Ask your health care provider which exercises are safe for you. Do exercises exactly as told by your health care provider and adjust them as directed. It is normal to feel mild stretching, pulling, tightness, or discomfort as you do these exercises. Stop right away if you feel sudden pain or your pain gets worse. Do not begin these exercises until told by your health care provider. Stretching and range-of-motion exercises These exercises warm up your muscles and joints and improve the movement and flexibility of your back. These exercises also help to relieve pain, numbness, and tingling. Lumbar rotation  Lie on your back  on a firm bed or the floor with your knees bent. Straighten your arms out to your sides so each arm forms a 90-degree angle (right angle) with a side of your body. Slowly move (rotate) both of your knees to one side of your body until you feel a stretch in your lower back (lumbar). Try not to let your shoulders lift off the floor. Hold this position for __________ seconds. Tense your  abdominal muscles and slowly move your knees back to the starting position. Repeat this exercise on the other side of your body. Repeat __________ times. Complete this exercise __________ times a day. Single knee to chest  Lie on your back on a firm bed or the floor with both legs straight. Bend one of your knees. Use your hands to move your knee up toward your chest until you feel a gentle stretch in your lower back and buttock. Hold your leg in this position by holding on to the front of your knee. Keep your other leg as straight as possible. Hold this position for __________ seconds. Slowly return to the starting position. Repeat with your other leg. Repeat __________ times. Complete this exercise __________ times a day. Prone extension on elbows  Lie on your abdomen on a firm bed or the floor (prone position). Prop yourself up on your elbows. Use your arms to help lift your chest up until you feel a gentle stretch in your abdomen and your lower back. This will place some of your body weight on your elbows. If this is uncomfortable, try stacking pillows under your chest. Your hips should stay down, against the surface that you are lying on. Keep your hip and back muscles relaxed. Hold this position for __________ seconds. Slowly relax your upper body and return to the starting position. Repeat __________ times. Complete this exercise __________ times a day. Strengthening exercises These exercises build strength and endurance in your back. Endurance is the ability to use your muscles for a long time, even after they get tired. Pelvic tilt This exercise strengthens the muscles that lie deep in the abdomen. Lie on your back on a firm bed or the floor with your legs extended. Bend your knees so they are pointing toward the ceiling and your feet are flat on the floor. Tighten your lower abdominal muscles to press your lower back against the floor. This motion will tilt your pelvis so your  tailbone points up toward the ceiling instead of pointing to your feet or the floor. To help with this exercise, you may place a small towel under your lower back and try to push your back into the towel. Hold this position for __________ seconds. Let your muscles relax completely before you repeat this exercise. Repeat __________ times. Complete this exercise __________ times a day. Alternating arm and leg raises  Get on your hands and knees on a firm surface. If you are on a hard floor, you may want to use padding, such as an exercise mat, to cushion your knees. Line up your arms and legs. Your hands should be directly below your shoulders, and your knees should be directly below your hips. Lift your left leg behind you. At the same time, raise your right arm and straighten it in front of you. Do not lift your leg higher than your hip. Do not lift your arm higher than your shoulder. Keep your abdominal and back muscles tight. Keep your hips facing the ground. Do not arch your back. Keep your  balance carefully, and do not hold your breath. Hold this position for __________ seconds. Slowly return to the starting position. Repeat with your right leg and your left arm. Repeat __________ times. Complete this exercise __________ times a day. Abdominal set with straight leg raise  Lie on your back on a firm bed or the floor. Bend one of your knees and keep your other leg straight. Tense your abdominal muscles and lift your straight leg up, 4-6 inches (10-15 cm) off the ground. Keep your abdominal muscles tight and hold this position for __________ seconds. Do not hold your breath. Do not arch your back. Keep it flat against the ground. Keep your abdominal muscles tense as you slowly lower your leg back to the starting position. Repeat with your other leg. Repeat __________ times. Complete this exercise __________ times a day. Single leg lower with bent knees Lie on your back on a firm bed  or the floor. Tense your abdominal muscles and lift your feet off the floor, one foot at a time, so your knees and hips are bent in 90-degree angles (right angles). Your knees should be over your hips and your lower legs should be parallel to the floor. Keeping your abdominal muscles tense and your knee bent, slowly lower one of your legs so your toe touches the ground. Lift your leg back up to return to the starting position. Do not hold your breath. Do not let your back arch. Keep your back flat against the ground. Repeat with your other leg. Repeat __________ times. Complete this exercise __________ times a day. Posture and body mechanics Good posture and healthy body mechanics can help to relieve stress in your body's tissues and joints. Body mechanics refers to the movements and positions of your body while you do your daily activities. Posture is part of body mechanics. Good posture means: Your spine is in its natural S-curve position (neutral). Your shoulders are pulled back slightly. Your head is not tipped forward (neutral). Follow these guidelines to improve your posture and body mechanics in your everyday activities. Standing  When standing, keep your spine neutral and your feet about hip-width apart. Keep a slight bend in your knees. Your ears, shoulders, and hips should line up. When you do a task in which you stand in one place for a long time, place one foot up on a stable object that is 2-4 inches (5-10 cm) high, such as a footstool. This helps keep your spine neutral. Sitting  When sitting, keep your spine neutral and keep your feet flat on the floor. Use a footrest, if necessary, and keep your thighs parallel to the floor. Avoid rounding your shoulders, and avoid tilting your head forward. When working at a desk or a computer, keep your desk at a height where your hands are slightly lower than your elbows. Slide your chair under your desk so you are close enough to maintain  good posture. When working at a computer, place your monitor at a height where you are looking straight ahead and you do not have to tilt your head forward or downward to look at the screen. Resting When lying down and resting, avoid positions that are most painful for you. If you have pain with activities such as sitting, bending, stooping, or squatting, lie in a position in which your body does not bend very much. For example, avoid curling up on your side with your arms and knees near your chest (fetal position). If you have pain with  activities such as standing for a long time or reaching with your arms, lie with your spine in a neutral position and bend your knees slightly. Try the following positions: Lying on your side with a pillow between your knees. Lying on your back with a pillow under your knees. Lifting  When lifting objects, keep your feet at least shoulder-width apart and tighten your abdominal muscles. Bend your knees and hips and keep your spine neutral. It is important to lift using the strength of your legs, not your back. Do not lock your knees straight out. Always ask for help to lift heavy or awkward objects. This information is not intended to replace advice given to you by your health care provider. Make sure you discuss any questions you have with your health care provider. Document Revised: 09/28/2022 Document Reviewed: 08/12/2020 Elsevier Patient Education  2024 [employer]Thoracic Strain Rehab Ask your health care provider which exercises are safe for you. Do exercises exactly as told by your provider and adjust them as directed. It is normal to feel mild stretching, pulling, tightness, or discomfort as you do these exercises. Stop right away if you feel sudden pain or your pain gets worse. Do not begin these exercises until told by your provider. Stretching and range-of-motion exercise This exercise warms up your muscles and joints and improves the movement and  flexibility of your back and shoulders. This exercise also helps to relieve pain. Chest and spine stretch  Lie down on your back on a firm surface. Roll a towel or a small blanket so it is about 4 inches (10 cm) in diameter. Put the towel under the middle of your back so it is under your spine, but not under your shoulder blades. Put your hands behind your head and let your elbows fall to your sides. This will increase your stretch. Take a deep breath (inhale). Hold for __________ seconds. Relax after you breathe out (exhale). Repeat __________ times. Complete this exercise __________ times a day. Strengthening exercises These exercises build strength and endurance in your back and your shoulder blade muscles. Endurance is the ability to use your muscles for a long time, even after they get tired. Alternating arm and leg raises  Get on your hands and knees on a firm surface. If you are on a hard floor, you may want to use padding, such as an exercise mat, to cushion your knees. Line up your arms and legs. Your hands should be directly below your shoulders, and your knees should be directly below your hips. Lift your left leg behind you. At the same time, raise your right arm and straighten it in front of you. Do not lift your leg higher than your hip. Do not lift your arm higher than your shoulder. Keep your abdominal and back muscles tight. Keep your hips facing the ground. Do not arch your back. Carefully stay balanced. Do not hold your breath. Hold for __________ seconds. Slowly return to the starting position and repeat with your right leg and your left arm. Repeat __________ times. Complete this exercise __________ times a day. Straight arm rows This exercise is also called the shoulder extension exercise. Stand with your feet shoulder width apart. Secure an exercise band to a stable object in front of you so the band is at or above shoulder height. Hold one end of the exercise  band in each hand. Straighten your elbows and lift your hands up to shoulder height. Step back, away from the secured end  of the exercise band, until the band stretches. Squeeze your shoulder blades together and pull your hands down to the sides of your thighs. Stop when your hands are straight down by your sides. This is shoulder extension. Do not let your hands go behind your body. Hold for __________ seconds. Slowly return to the starting position. Repeat __________ times. Complete this exercise __________ times a day. Rowing scapular retraction This is an exercise in which the shoulder blades (scapulae) are pulled toward each other (retraction). Sit in a stable chair without armrests, or stand up. Secure an exercise band to a stable object in front of you so the band is at shoulder height. Hold one end of the exercise band in each hand. Your palms should face toward each other. Bring your arms out straight in front of you. Step back, away from the secured end of the exercise band, until the band stretches. Pull the band backward. As you do this, bend your elbows and squeeze your shoulder blades together, but avoid letting the rest of your body move. Do not shrug your shoulders upward while you do this. Stop when your elbows are at your sides or slightly behind your body. Hold for __________ seconds. Slowly straighten your arms to return to the starting position. Repeat __________ times. Complete this exercise __________ times a day. Posture and body mechanics Good posture and healthy body mechanics can help to relieve stress in your body's tissues and joints. Body mechanics refers to the movements and positions of your body while you do your daily activities. Posture is part of body mechanics. Good posture means: Your spine is in its natural S-curve position (neutral). Your shoulders are pulled back slightly. Your head is not tipped forward. Follow these guidelines to improve your posture  and body mechanics in your everyday activities. Standing  When standing, keep your spine neutral and your feet about hip width apart. Keep a slight bend in your knees. Your ears, shoulders, and hips should line up with each other. When you do a task in which you lean forward while standing in one place for a long time, place one foot up on a stable object that is 2-4 inches (5-10 cm) high, such as a footstool. This helps keep your spine neutral. Sitting  When sitting, keep your spine neutral and keep your feet flat on the floor. Use a footrest if needed. Keep your thighs parallel to the floor. Avoid rounding your shoulders, and avoid tilting your head forward. When working at a desk or a computer, keep your desk at a height where your hands are slightly lower than your elbows. Slide your chair under your desk so you are close enough to maintain good posture. When working at a computer, place your monitor at a height where you are looking straight ahead and you do not have to tilt your head forward or downward to look at the screen. Resting When lying down and resting, avoid positions that are most painful for you. If you have pain with activities such as sitting, bending, stooping, or squatting (flexion-basedactivities), lie in a position in which your body does not bend very much. For example, avoid curling up on your side with your arms and knees near your chest (fetal position). If you have pain with activities such as standing for a long time or reaching with your arms (extension-basedactivities), lie with your spine in a neutral position and bend your knees slightly. Try the following positions: Lie on your side with a  pillow between your knees. Lie on your back with a pillow under your knees.  Lifting  When lifting objects, keep your feet at least shoulder width apart and tighten your abdominal muscles. Bend your knees and hips and keep your spine neutral. It is important to lift using the  strength of your legs, not your back. Do not lock your knees straight out. Always ask for help to lift heavy or awkward objects. This information is not intended to replace advice given to you by your health care provider. Make sure you discuss any questions you have with your health care provider. Document Revised: 11/06/2022 Document Reviewed: 01/12/2022 Elsevier Patient Education  2024 [employer].

## 2024-05-20 ENCOUNTER — Other Ambulatory Visit (HOSPITAL_COMMUNITY): Payer: Self-pay

## 2024-05-22 ENCOUNTER — Other Ambulatory Visit (HOSPITAL_COMMUNITY): Payer: Self-pay

## 2024-05-22 ENCOUNTER — Other Ambulatory Visit: Payer: Self-pay

## 2024-05-22 MED ORDER — AMPHETAMINE-DEXTROAMPHETAMINE 20 MG PO TABS
10.0000 mg | ORAL_TABLET | Freq: Two times a day (BID) | ORAL | 0 refills | Status: AC
  Filled 2024-05-22: qty 30, 30d supply, fill #0

## 2024-05-23 ENCOUNTER — Encounter (HOSPITAL_COMMUNITY): Payer: Self-pay

## 2024-05-23 ENCOUNTER — Other Ambulatory Visit (HOSPITAL_COMMUNITY): Payer: Self-pay

## 2024-05-25 ENCOUNTER — Other Ambulatory Visit: Payer: Self-pay

## 2024-05-25 ENCOUNTER — Other Ambulatory Visit (HOSPITAL_COMMUNITY): Payer: Self-pay

## 2024-05-25 DIAGNOSIS — F900 Attention-deficit hyperactivity disorder, predominantly inattentive type: Secondary | ICD-10-CM | POA: Diagnosis not present

## 2024-05-25 MED ORDER — AMPHETAMINE-DEXTROAMPHET ER 20 MG PO CP24
20.0000 mg | ORAL_CAPSULE | Freq: Two times a day (BID) | ORAL | 0 refills | Status: AC
  Filled 2024-05-25 – 2024-05-26 (×2): qty 60, 30d supply, fill #0

## 2024-05-25 MED ORDER — AMPHETAMINE-DEXTROAMPHET ER 20 MG PO CP24
20.0000 mg | ORAL_CAPSULE | Freq: Two times a day (BID) | ORAL | 0 refills | Status: AC
  Filled 2024-07-10: qty 60, 30d supply, fill #0
  Filled 2024-07-12: qty 40, 20d supply, fill #0

## 2024-05-26 ENCOUNTER — Encounter (HOSPITAL_COMMUNITY): Payer: Self-pay

## 2024-05-26 ENCOUNTER — Other Ambulatory Visit (HOSPITAL_COMMUNITY): Payer: Self-pay

## 2024-06-07 ENCOUNTER — Other Ambulatory Visit: Payer: Self-pay | Admitting: Internal Medicine

## 2024-06-15 ENCOUNTER — Telehealth: Payer: Self-pay | Admitting: Pharmacist

## 2024-06-15 NOTE — Telephone Encounter (Signed)
 Submitted a Prior Authorization renewal request to [employer] for ENBREL  via CoverMyMeds. Will update once we receive a response.  Key: [id number]

## 2024-06-15 NOTE — Telephone Encounter (Signed)
 Received notification from [facility] regarding a prior authorization for ENBREL . Authorization has been APPROVED from 05/16/2024 to 06/15/2025. Approval letter sent to scan center.   Authorization # [id number]  Sherry Pennant, PharmD, MPH, BCPS, CPP Clinical Pharmacist

## 2024-06-22 ENCOUNTER — Other Ambulatory Visit: Payer: Self-pay

## 2024-07-06 ENCOUNTER — Other Ambulatory Visit: Payer: Self-pay

## 2024-07-10 ENCOUNTER — Other Ambulatory Visit (HOSPITAL_COMMUNITY): Payer: Self-pay

## 2024-07-11 ENCOUNTER — Other Ambulatory Visit: Payer: Self-pay

## 2024-07-12 ENCOUNTER — Other Ambulatory Visit (HOSPITAL_COMMUNITY): Payer: Self-pay

## 2024-10-18 ENCOUNTER — Ambulatory Visit: Admitting: Physician Assistant
